# Patient Record
Sex: Female | Born: 1975 | Race: White | Hispanic: No | Smoking: Current every day smoker
Health system: Southern US, Community
[De-identification: ages and names within clinical notes are randomized; demographics above are authoritative.]

## PROBLEM LIST (undated history)

## (undated) DIAGNOSIS — E079 Disorder of thyroid, unspecified: Secondary | ICD-10-CM

## (undated) DIAGNOSIS — J45909 Unspecified asthma, uncomplicated: Secondary | ICD-10-CM

## (undated) DIAGNOSIS — F329 Major depressive disorder, single episode, unspecified: Secondary | ICD-10-CM

## (undated) DIAGNOSIS — M797 Fibromyalgia: Secondary | ICD-10-CM

## (undated) DIAGNOSIS — F419 Anxiety disorder, unspecified: Secondary | ICD-10-CM

## (undated) DIAGNOSIS — R519 Headache, unspecified: Secondary | ICD-10-CM

## (undated) DIAGNOSIS — R51 Headache: Secondary | ICD-10-CM

## (undated) DIAGNOSIS — E039 Hypothyroidism, unspecified: Secondary | ICD-10-CM

## (undated) DIAGNOSIS — K859 Acute pancreatitis without necrosis or infection, unspecified: Secondary | ICD-10-CM

## (undated) DIAGNOSIS — D649 Anemia, unspecified: Secondary | ICD-10-CM

## (undated) DIAGNOSIS — I82890 Acute embolism and thrombosis of other specified veins: Secondary | ICD-10-CM

## (undated) DIAGNOSIS — A6000 Herpesviral infection of urogenital system, unspecified: Secondary | ICD-10-CM

## (undated) DIAGNOSIS — F32A Depression, unspecified: Secondary | ICD-10-CM

## (undated) DIAGNOSIS — G43909 Migraine, unspecified, not intractable, without status migrainosus: Secondary | ICD-10-CM

## (undated) DIAGNOSIS — F319 Bipolar disorder, unspecified: Secondary | ICD-10-CM

## (undated) HISTORY — DX: Migraine, unspecified, not intractable, without status migrainosus: G43.909

## (undated) HISTORY — DX: Disorder of thyroid, unspecified: E07.9

## (undated) HISTORY — PX: FOOT SURGERY: SHX648

## (undated) HISTORY — PX: WISDOM TOOTH EXTRACTION: SHX21

## (undated) HISTORY — DX: Depression, unspecified: F32.A

## (undated) HISTORY — DX: Major depressive disorder, single episode, unspecified: F32.9

## (undated) HISTORY — PX: COLONOSCOPY: SHX174

## (undated) HISTORY — DX: Herpesviral infection of urogenital system, unspecified: A60.00

## (undated) HISTORY — DX: Bipolar disorder, unspecified: F31.9

## (undated) HISTORY — DX: Anxiety disorder, unspecified: F41.9

## (undated) HISTORY — DX: Acute embolism and thrombosis of other specified veins: I82.890

## (undated) HISTORY — DX: Acute pancreatitis without necrosis or infection, unspecified: K85.90

## (undated) HISTORY — DX: Unspecified asthma, uncomplicated: J45.909

---

## 1998-02-09 ENCOUNTER — Other Ambulatory Visit: Admission: RE | Admit: 1998-02-09 | Discharge: 1998-02-09 | Payer: Self-pay | Admitting: Gastroenterology

## 1998-08-25 ENCOUNTER — Other Ambulatory Visit: Admission: RE | Admit: 1998-08-25 | Discharge: 1998-08-25 | Payer: Self-pay | Admitting: Obstetrics

## 1999-09-30 ENCOUNTER — Other Ambulatory Visit: Admission: RE | Admit: 1999-09-30 | Discharge: 1999-09-30 | Payer: Self-pay | Admitting: Obstetrics

## 2001-04-11 ENCOUNTER — Other Ambulatory Visit: Admission: RE | Admit: 2001-04-11 | Discharge: 2001-04-11 | Payer: Self-pay | Admitting: Obstetrics and Gynecology

## 2001-05-03 ENCOUNTER — Ambulatory Visit (HOSPITAL_COMMUNITY): Admission: RE | Admit: 2001-05-03 | Discharge: 2001-05-03 | Payer: Self-pay | Admitting: Obstetrics and Gynecology

## 2001-05-03 ENCOUNTER — Encounter: Payer: Self-pay | Admitting: Obstetrics and Gynecology

## 2001-06-08 ENCOUNTER — Inpatient Hospital Stay (HOSPITAL_COMMUNITY): Admission: AD | Admit: 2001-06-08 | Discharge: 2001-06-08 | Payer: Self-pay | Admitting: Obstetrics and Gynecology

## 2001-07-05 ENCOUNTER — Ambulatory Visit (HOSPITAL_COMMUNITY): Admission: RE | Admit: 2001-07-05 | Discharge: 2001-07-05 | Payer: Self-pay | Admitting: Obstetrics and Gynecology

## 2001-09-21 ENCOUNTER — Inpatient Hospital Stay (HOSPITAL_COMMUNITY): Admission: AD | Admit: 2001-09-21 | Discharge: 2001-09-21 | Payer: Self-pay | Admitting: Obstetrics and Gynecology

## 2001-09-27 ENCOUNTER — Inpatient Hospital Stay (HOSPITAL_COMMUNITY): Admission: AD | Admit: 2001-09-27 | Discharge: 2001-09-29 | Payer: Self-pay | Admitting: Obstetrics and Gynecology

## 2003-01-31 ENCOUNTER — Other Ambulatory Visit: Admission: RE | Admit: 2003-01-31 | Discharge: 2003-01-31 | Payer: Self-pay | Admitting: Obstetrics and Gynecology

## 2003-10-06 ENCOUNTER — Other Ambulatory Visit: Admission: RE | Admit: 2003-10-06 | Discharge: 2003-10-06 | Payer: Self-pay | Admitting: Obstetrics and Gynecology

## 2005-11-02 ENCOUNTER — Other Ambulatory Visit: Admission: RE | Admit: 2005-11-02 | Discharge: 2005-11-02 | Payer: Self-pay | Admitting: Obstetrics and Gynecology

## 2007-01-15 ENCOUNTER — Ambulatory Visit (HOSPITAL_COMMUNITY): Payer: Self-pay | Admitting: Psychiatry

## 2007-01-31 ENCOUNTER — Ambulatory Visit (HOSPITAL_COMMUNITY): Payer: Self-pay | Admitting: Psychiatry

## 2007-03-21 ENCOUNTER — Ambulatory Visit (HOSPITAL_COMMUNITY): Payer: Self-pay | Admitting: Psychiatry

## 2007-09-27 DIAGNOSIS — E039 Hypothyroidism, unspecified: Secondary | ICD-10-CM

## 2007-09-27 HISTORY — DX: Hypothyroidism, unspecified: E03.9

## 2008-01-21 ENCOUNTER — Encounter: Admission: RE | Admit: 2008-01-21 | Discharge: 2008-01-21 | Payer: Self-pay | Admitting: Family Medicine

## 2009-01-03 ENCOUNTER — Emergency Department (HOSPITAL_COMMUNITY): Admission: EM | Admit: 2009-01-03 | Discharge: 2009-01-03 | Payer: Self-pay | Admitting: Emergency Medicine

## 2009-01-19 ENCOUNTER — Ambulatory Visit (HOSPITAL_COMMUNITY): Admission: RE | Admit: 2009-01-19 | Discharge: 2009-01-19 | Payer: Self-pay | Admitting: Gastroenterology

## 2009-03-17 ENCOUNTER — Ambulatory Visit: Payer: Self-pay | Admitting: Family Medicine

## 2009-12-18 ENCOUNTER — Encounter: Admission: RE | Admit: 2009-12-18 | Discharge: 2009-12-18 | Payer: Self-pay | Admitting: Family Medicine

## 2011-01-05 LAB — AMYLASE: Amylase: 91 U/L (ref 27–131)

## 2011-01-05 LAB — URINALYSIS, ROUTINE W REFLEX MICROSCOPIC
Bilirubin Urine: NEGATIVE
Glucose, UA: NEGATIVE mg/dL
Ketones, ur: NEGATIVE mg/dL
Nitrite: NEGATIVE
pH: 5.5 (ref 5.0–8.0)

## 2011-01-05 LAB — COMPREHENSIVE METABOLIC PANEL
CO2: 27 mEq/L (ref 19–32)
Calcium: 8.8 mg/dL (ref 8.4–10.5)
Creatinine, Ser: 0.71 mg/dL (ref 0.4–1.2)
GFR calc non Af Amer: >60 mL/min (ref >60)
Glucose, Bld: 96 mg/dL (ref 70–99)

## 2011-01-05 LAB — CBC
Hemoglobin: 13 g/dL (ref 12.0–15.0)
MCHC: 34.7 g/dL (ref 30.0–36.0)
MCV: 90 fL (ref 78.0–100.0)
RBC: 4.16 MIL/uL (ref 3.87–5.11)

## 2011-01-05 LAB — DIFFERENTIAL
Lymphocytes Relative: 46 % (ref 12–46)
Lymphs Abs: 4 10*3/uL (ref 0.7–4.0)
Neutrophils Relative %: 43 % (ref 43–77)

## 2011-01-05 LAB — LIPASE, BLOOD: Lipase: 84 U/L — ABNORMAL HIGH (ref 11–59)

## 2011-02-11 NOTE — Discharge Summary (Signed)
[facility]  Patient:    Gail Blackburn, Gail Blackburn Visit Number: [id number] MRN: [id number]          Service Type: OBS Location: 910A [id number] Attending Physician:  Oliver Pila Dictated by:   Malachi Pro. Ambrose Mantle, M.D. Admit Date:  09/27/2001 Discharge Date: 09/29/2001                             Discharge Summary  HISTORY OF PRESENT ILLNESS:   A 35 year old white female, para 1-0-0-1, gravida 2, at 39+ weeks gestation, [facility] October 02, 2001, by 10-week ultrasound, inconsistent with last menstrual period.  She presented for induction of labor given favorable cervix, term status, and history of macrosomic infant. Pregnancy complicated by positive smoking, cut down to two to three cigarettes a day, and history of depression.  She went on Paxil in the second trimester and has done well.   Blood group and type AB negative with a negative antibody. RPR nonreactive.  Rubella immune.  Hepatitis B surface antigen negative, HIV negative.  GC negative, chlamydia negative, Group B strep negative.  One-hour glucola 117.  OBSTETRICAL HISTORY:          She had a spontaneous vaginal delivery in 1998, a 9 pound 1 ounce infant.  PAST GYNECOLOGIC HISTORY:     History of HPV treated with Aldara.  PAST SURGICAL HISTORY:        Negative.  PAST MEDICAL HISTORY:         Depression, question about bipolar.  Migraines.  PHYSICAL EXAMINATION:  VITAL SIGNS:                  On admission, the patient was afebrile with normal vital signs.  PELVIC:                       Fetal heart tones were normal.  Estimated fetal weight about 8 pounds.  Cervix 2 to 3 cm, 60%, vertex, -2.  Artificial rupture of membranes produced clear fluid.  HOSPITAL COURSE:              Pitocin was begun.  By 1 p.m. on September 27, 2001, the patient was 4+ cm.  An intrauterine pressure catheter was placed given little change in the 2 hours prior to 1 p.m.  The patient reached complete dilatation and pushed  well with a spontaneous vaginal delivery of a vigorous female infant over intact perineum.  The infant weighed 8 pound 3 ounces.  Apgars were 8 at 1 minute and 9 at 5 minutes.  Dr. Senaida Ores was present at delivery.  Moderate shoulder dystocia was relieved with McRoberts and posterior arm release.  Placenta was delivered spontaneously.  Perineum, cervix, and rectum were intact.  The baby was moving both arms well.  Blood loss about 350 cc.  Postpartum, the patient did well.  She did receive RhoGAM.  On September 28, 2001, she was discharged on the second postpartum day.  LABORATORY DATA:  Hemoglobin on admission 12.1, hematocrit 35.3, white count 11,800, platelet count 237,000.  Followup hemoglobin 11.4, hematocrit 33.3, platelet count 204,000.  RPR was nonreactive.  FINAL DIAGNOSES:              1. Intrauterine pregnancy at 39+ weeks.  2. History of macrosomic delivery.                               3. Depression.                               4. Smoker.                               5. Shoulder dystocia.  OPERATION:                    Spontaneous vaginal delivery  FINAL CONDITION:              Improved.  DISCHARGE INSTRUCTIONS:       Include our regular discharge instructions booklet.  Percocet 5/325, 16 tablets, 1 every 4-6 hours as needed for pain. The patient is advised to return to the office in six weeks for followup examination. Dictated by:   Malachi Pro. Ambrose Mantle, M.D. Attending Physician:  Oliver Pila DD:  09/29/01 TD:  09/29/01 Job: [id number]

## 2014-04-10 ENCOUNTER — Emergency Department (HOSPITAL_COMMUNITY)
Admission: EM | Admit: 2014-04-10 | Discharge: 2014-04-10 | Disposition: A | Discharge status: Home / Self Care | Payer: Medicaid Other | Source: Home / Self Care

## 2014-04-10 ENCOUNTER — Encounter (HOSPITAL_COMMUNITY): Payer: Self-pay | Admitting: Emergency Medicine

## 2014-04-10 DIAGNOSIS — A6000 Herpesviral infection of urogenital system, unspecified: Secondary | ICD-10-CM

## 2014-04-10 DIAGNOSIS — N390 Urinary tract infection, site not specified: Secondary | ICD-10-CM

## 2014-04-10 LAB — POCT PREGNANCY, URINE: Preg Test, Ur: NEGATIVE

## 2014-04-10 LAB — POCT URINALYSIS DIP (DEVICE)
Bilirubin Urine: NEGATIVE
GLUCOSE, UA: 100 mg/dL — AB
HGB URINE DIPSTICK: NEGATIVE
Ketones, ur: NEGATIVE mg/dL
NITRITE: POSITIVE — AB
PH: 6 (ref 5.0–8.0)
Protein, ur: NEGATIVE mg/dL
SPECIFIC GRAVITY, URINE: 1.015 (ref 1.005–1.030)
UROBILINOGEN UA: 1 mg/dL (ref 0.0–1.0)

## 2014-04-10 MED ORDER — PHENAZOPYRIDINE HCL 200 MG PO TABS: 200.0000 mg | ORAL_TABLET | Freq: Three times a day (TID) | ORAL | Status: DC | PRN

## 2014-04-10 MED ORDER — FLUCONAZOLE 150 MG PO TABS: 150.0000 mg | ORAL_TABLET | ORAL | Status: DC

## 2014-04-10 MED ORDER — CIPROFLOXACIN HCL 500 MG PO TABS: 500.0000 mg | ORAL_TABLET | Freq: Two times a day (BID) | ORAL | Status: DC

## 2014-04-10 NOTE — ED Provider Notes (Signed)
CSN: [id number]     Arrival date & time 04/10/14  1804 History   None    No chief complaint on file.  (Consider location/radiation/quality/duration/timing/severity/associated sxs/prior Treatment) HPI Comments: 38 yo WF with hematuria x 2 days, dysuria and urgency x 3-4 days. She denies hematuria today but has been taking AZO which makes it difficult to view. She denies relief with AZO/ increased water/ cranberry juice. She does have + tobacco history but denies previous history of hematuria. SHe also notes yeast infection when given antibiotics. She denies any concern for STDs with only one partner.    No past medical history on file. No past surgical history on file. No family history on file. History  Substance Use Topics  . Smoking status: Not on file  . Smokeless tobacco: Not on file  . Alcohol Use: Not on file   OB History   No data available     Review of Systems  Genitourinary: Positive for dysuria, frequency, hematuria and difficulty urinating.  All other systems reviewed and are negative.   Allergies  Review of patient's allergies indicates not on file.  Home Medications   Prior to Admission medications   Not on File   BP 108/73  Pulse 82  Temp(Src) 98.5 F (36.9 C) (Oral)  Resp 16  SpO2 100% Physical Exam  Nursing note and vitals reviewed. Constitutional: She is oriented to person, place, and time. She appears well-developed and well-nourished.  HENT:  Head: Normocephalic and atraumatic.  Eyes: Conjunctivae are normal.  Neck: Normal range of motion.  Cardiovascular: Normal rate, regular rhythm, normal heart sounds and intact distal pulses.   Pulmonary/Chest: Effort normal and breath sounds normal.  Abdominal: Soft. Bowel sounds are normal. She exhibits no distension and no mass. There is tenderness. There is no rebound and no guarding.  Minimal suprapubic  Genitourinary:  declines  Musculoskeletal: Normal range of motion.  Neurological: She is alert  and oriented to person, place, and time.  Skin: Skin is warm and dry.  Psychiatric: She has a normal mood and affect. Judgment normal.    ED Course  Procedures (including critical care time) Labs Review Labs Reviewed  POCT URINALYSIS DIP (DEVICE) - Abnormal; Notable for the following:    Glucose, UA 100 (*)    Nitrite POSITIVE (*)    Leukocytes, UA TRACE (*)    All other components within normal limits  POCT PREGNANCY, URINE    Imaging Review No results found.   MDM   1. Urinary tract infection, site not specified   Cipro 500 mg AD, Pyridium 200 mg AD, Diflucan 150 mg PRN  Advised needs to establish PCP so recheck of urine can be performed in 1 month with history of hematuria with tobacco use. w/c if SX increase or ER.   Berenice PrimasMelissa R Marquay Kruse, PA-C 04/10/14 1901

## 2014-04-10 NOTE — Discharge Instructions (Signed)
Smoking Cessation, Tips for Success If you are ready to quit smoking, congratulations! You have chosen to help yourself be healthier. Cigarettes bring nicotine, tar, carbon monoxide, and other irritants into your body. Your lungs, heart, and blood vessels will be able to work better without these poisons. There are many different ways to quit smoking. Nicotine gum, nicotine patches, a nicotine inhaler, or nicotine nasal spray can help with physical craving. Hypnosis, support groups, and medicines help break the habit of smoking. WHAT THINGS CAN I DO TO MAKE QUITTING EASIER?  Here are some tips to help you quit for good:  Pick a date when you will quit smoking completely. Tell all of your friends and family about your plan to quit on that date.  Do not try to slowly cut down on the number of cigarettes you are smoking. Pick a quit date and quit smoking completely starting on that day.  Throw away all cigarettes.   Clean and remove all ashtrays from your home, work, and car.   On a card, write down your reasons for quitting. Carry the card with you and read it when you get the urge to smoke.   Cleanse your body of nicotine. Drink enough water and fluids to keep your urine clear or pale yellow. Do this after quitting to flush the nicotine from your body.   Learn to predict your moods. Do not let a bad situation be your excuse to have a cigarette. Some situations in your life might tempt you into wanting a cigarette.   Never have "just one" cigarette. It leads to wanting another and another. Remind yourself of your decision to quit.   Change habits associated with smoking. If you smoked while driving or when feeling stressed, try other activities to replace smoking. Stand up when drinking your coffee. Brush your teeth after eating. Sit in a different chair when you read the paper. Avoid alcohol while trying to quit, and try to drink fewer caffeinated beverages. Alcohol and caffeine may urge  you to smoke.   Avoid foods and drinks that can trigger a desire to smoke, such as sugary or spicy foods and alcohol.   Ask people who smoke not to smoke around you.   Have something planned to do right after eating or having a cup of coffee. For example, plan to take a walk or exercise.   Try a relaxation exercise to calm you down and decrease your stress. Remember, you may be tense and nervous for the first 2 weeks after you quit, but this will pass.   Find new activities to keep your hands busy. Play with a pen, coin, or rubber band. Doodle or draw things on paper.   Brush your teeth right after eating. This will help cut down on the craving for the taste of tobacco after meals. You can also try mouthwash.   Use oral substitutes in place of cigarettes. Try using lemon drops, carrots, cinnamon sticks, or chewing gum. Keep them handy so they are available when you have the urge to smoke.   When you have the urge to smoke, try deep breathing.   Designate your home as a nonsmoking area.   If you are a heavy smoker, ask your health care provider about a prescription for nicotine chewing gum. It can ease your withdrawal from nicotine.   Reward yourself. Set aside the cigarette money you save and buy yourself something nice.   Look for support from others. Join a support group or  smoking cessation program. Ask someone at home or at work to help you with your plan to quit smoking.   Always ask yourself, "Do I need this cigarette or is this just a reflex?" Tell yourself, "Today, I choose not to smoke," or "I do not want to smoke." You are reminding yourself of your decision to quit.  Do not replace cigarette smoking with electronic cigarettes (commonly called e-cigarettes). The safety of e-cigarettes is unknown, and some may contain harmful chemicals.  If you relapse, do not give up! Plan ahead and think about what you will do the next time you get the urge to smoke.  HOW WILL  I FEEL WHEN I QUIT SMOKING? You may have symptoms of withdrawal because your body is used to nicotine (the addictive substance in cigarettes). You may crave cigarettes, be irritable, feel very hungry, cough often, get headaches, or have difficulty concentrating. The withdrawal symptoms are only temporary. They are strongest when you first quit but will go away within 10-14 days. When withdrawal symptoms occur, stay in control. Think about your reasons for quitting. Remind yourself that these are signs that your body is healing and getting used to being without cigarettes. Remember that withdrawal symptoms are easier to treat than the major diseases that smoking can cause.  Even after the withdrawal is over, expect periodic urges to smoke. However, these cravings are generally short lived and will go away whether you smoke or not. Do not smoke!  WHAT RESOURCES ARE AVAILABLE TO HELP ME QUIT SMOKING? Your health care provider can direct you to community resources or hospitals for support, which may include:  Group support.  Education.  Hypnosis.  Therapy. Document Released: 06/10/2004 Document Revised: 07/03/2013 Document Reviewed: 02/28/2013 [facility] Patient Information 2015 [place], [place]. This information is not intended to replace advice given to you by your health care provider. Make sure you discuss any questions you have with your health care provider.   Hematuria, Adult Hematuria is blood in your urine. It can be caused by a bladder infection, kidney infection, prostate infection, kidney stone, or cancer of your urinary tract. Infections can usually be treated with medicine, and a kidney stone usually will pass through your urine. If neither of these is the cause of your hematuria, further workup to find out the reason may be needed. It is very important that you tell your health care provider about any blood you see in your urine, even if the blood stops without treatment or happens without  causing pain. Blood in your urine that happens and then stops and then happens again can be a symptom of a very serious condition. Also, pain is not a symptom in the initial stages of many urinary cancers. HOME CARE INSTRUCTIONS   Drink lots of fluid, 3-4 quarts a day. If you have been diagnosed with an infection, cranberry juice is especially recommended, in addition to large amounts of water.  Avoid caffeine, tea, and carbonated beverages, because they tend to irritate the bladder.  Avoid alcohol because it may irritate the prostate.  Only take over-the-counter or prescription medicines for pain, discomfort, or fever as directed by your health care provider.  If you have been diagnosed with a kidney stone, follow your health care provider's instructions regarding straining your urine to catch the stone.  Empty your bladder often. Avoid holding urine for long periods of time.  After a bowel movement, women should cleanse front to back. Use each tissue only once.  Empty your bladder before  and after sexual intercourse if you are a female. SEEK MEDICAL CARE IF: You develop back pain, fever, a feeling of sickness in your stomach (nausea), or vomiting or if your symptoms are not better in 3 days. Return sooner if you are getting worse. SEEK IMMEDIATE MEDICAL CARE IF:   You have a persistent fever, with a temperature of 101.18F (38.8C) or greater.  You develop severe vomiting and are unable to keep the medicine down.  You develop severe back or abdominal pain despite taking your medicines.  You begin passing a large amount of blood or clots in your urine.  You feel extremely weak or faint, or you pass out. MAKE SURE YOU:   Understand these instructions.  Will watch your condition.  Will get help right away if you are not doing well or get worse. Document Released: 09/12/2005 Document Revised: 07/03/2013 Document Reviewed: 05/13/2013 [facility] Patient Information 2015 [place]. This information is not intended to replace advice given to you by your health care provider. Make sure you discuss any questions you have with your health care provider.   Urinary Tract Infection A urinary tract infection (UTI) can occur any place along the urinary tract. The tract includes the kidneys, ureters, bladder, and urethra. A type of germ called bacteria often causes a UTI. UTIs are often helped with antibiotic medicine.  HOME CARE   If given, take antibiotics as told by your doctor. Finish them even if you start to feel better.  Drink enough fluids to keep your pee (urine) clear or pale yellow.  Avoid tea, drinks with caffeine, and bubbly (carbonated) drinks.  Pee often. Avoid holding your pee in for a long time.  Pee before and after having sex (intercourse).  Wipe from front to back after you poop (bowel movement) if you are a woman. Use each tissue only once. GET HELP RIGHT AWAY IF:   You have back pain.  You have lower belly (abdominal) pain.  You have chills.  You feel sick to your stomach (nauseous).  You throw up (vomit).  Your burning or discomfort with peeing does not go away.  You have a fever.  Your symptoms are not better in 3 days. MAKE SURE YOU:   Understand these instructions.  Will watch your condition.  Will get help right away if you are not doing well or get worse. Document Released: 02/29/2008 Document Revised: 06/06/2012 Document Reviewed: 04/12/2012 [facility] Patient Information 2015 [place], [place]. This information is not intended to replace advice given to you by your health care provider. Make sure you discuss any questions you have with your health care provider.

## 2014-04-10 NOTE — ED Notes (Signed)
C/o burning with urination, pressure and pins and needles when sitting onset 4 days ago.  Tried AZO without relief of symptoms. Drinking water and cranberry  Juice.

## 2014-04-12 ENCOUNTER — Encounter (HOSPITAL_COMMUNITY): Payer: Self-pay | Admitting: Emergency Medicine

## 2014-04-12 ENCOUNTER — Emergency Department (INDEPENDENT_AMBULATORY_CARE_PROVIDER_SITE_OTHER)
Admission: EM | Admit: 2014-04-12 | Discharge: 2014-04-12 | Disposition: A | Discharge status: Home / Self Care | Payer: Medicaid Other | Source: Home / Self Care | Attending: Emergency Medicine | Admitting: Emergency Medicine

## 2014-04-12 DIAGNOSIS — A6000 Herpesviral infection of urogenital system, unspecified: Secondary | ICD-10-CM

## 2014-04-12 MED ORDER — ACYCLOVIR 400 MG PO TABS: 400.0000 mg | ORAL_TABLET | Freq: Three times a day (TID) | ORAL | Status: DC

## 2014-04-12 MED ORDER — IBUPROFEN 800 MG PO TABS
800.0000 mg | ORAL_TABLET | Freq: Once | ORAL | Status: AC
  Administered 2014-04-12: 800 mg via ORAL

## 2014-04-12 MED ORDER — ACYCLOVIR 5 % EX CREA: 1.0000 | TOPICAL_CREAM | CUTANEOUS | Status: DC

## 2014-04-12 MED ORDER — IBUPROFEN 800 MG PO TABS
ORAL_TABLET | ORAL | Status: AC
  Filled 2014-04-12: qty 1

## 2014-04-12 NOTE — Discharge Instructions (Signed)
Genital Herpes °Genital herpes is a sexually transmitted disease. This means that it is a disease passed by having sex with an infected person. There is no cure for genital herpes. The time between attacks can be months to years. The virus may live in a person but produce no problems (symptoms). This infection can be passed to a baby as it travels down the birth canal (vagina). In a newborn, this can cause central nervous system damage, eye damage, or even death. The virus that causes genital herpes is usually HSV-2 virus. The virus that causes oral herpes is usually HSV-1. The diagnosis (learning what is wrong) is made through culture results. °SYMPTOMS  °Usually symptoms of pain and itching begin a few days to a week after contact. It first appears as small blisters that progress to small painful ulcers which then scab over and heal after several days. It affects the outer genitalia, birth canal, cervix, penis, anal area, buttocks, and thighs. °HOME CARE INSTRUCTIONS  °· Keep ulcerated areas dry and clean. °· Take medications as directed. Antiviral medications can speed up healing. They will not prevent recurrences or cure this infection. These medications can also be taken for suppression if there are frequent recurrences. °· While the infection is active, it is contagious. Avoid all sexual contact during active infections. °· Condoms may help prevent spread of the herpes virus. °· Practice safe sex. °· Wash your hands thoroughly after touching the genital area. °· Avoid touching your eyes after touching your genital area. °· Inform your caregiver if you have had genital herpes and become pregnant. It is your responsibility to insure a safe outcome for your baby in this pregnancy. °· Only take over-the-counter or prescription medicines for pain, discomfort, or fever as directed by your caregiver. °SEEK MEDICAL CARE IF:  °· You have a recurrence of this infection. °· You do not respond to medications and are not  improving. °· You have new sources of pain or discharge which have changed from the original infection. °· You have an oral temperature above 102° F (38.9° C). °· You develop abdominal pain. °· You develop eye pain or signs of eye infection. °Document Released: 09/09/2000 Document Revised: 12/05/2011 Document Reviewed: 09/30/2009 °ExitCare® Patient Information ©2015 [employer], LLC. This information is not intended to replace advice given to you by your health care provider. Make sure you discuss any questions you have with your health care provider. ° °

## 2014-04-12 NOTE — ED Notes (Signed)
Pt  Has  A  Flair  Up of  Herpes      She is  Requesting  A  Refill  Of  Her   Zovirax

## 2014-04-12 NOTE — ED Provider Notes (Signed)
CSN: [id number]     Arrival date & time 04/12/14  1422 History   First MD Initiated Contact with Patient 04/12/14 1445     Chief Complaint  Patient presents with  . Medication Refill   (Consider location/radiation/quality/duration/timing/severity/associated sxs/prior Treatment) HPI Comments: Patient reports reoccurrence of genital herpes that began over night. Requesting refill of acyclovir.   The history is provided by the patient.    History reviewed. No pertinent past medical history. History reviewed. No pertinent past surgical history. History reviewed. No pertinent family history. History  Substance Use Topics  . Smoking status: Current Every Day Smoker -- 0.50 packs/day    Types: Cigarettes  . Smokeless tobacco: Not on file  . Alcohol Use: No   OB History   Grav Para Term Preterm Abortions TAB SAB Ect Mult Living                 Review of Systems  All other systems reviewed and are negative.   Allergies  Review of patient's allergies indicates no known allergies.  Home Medications   Prior to Admission medications   Medication Sig Start Date End Date Taking? Authorizing Provider  acyclovir (ZOVIRAX) 400 MG tablet Take 1 tablet (400 mg total) by mouth 3 (three) times daily. X 7 days 04/12/14   Ardis RowanJennifer Lee Kechia Yahnke, PA  acyclovir cream (ZOVIRAX) 5 % Apply 1 application topically every 3 (three) hours. X 7 days 04/12/14   Ardis RowanJennifer Lee Wylie Russon, PA  ciprofloxacin (CIPRO) 500 MG tablet Take 1 tablet (500 mg total) by mouth every 12 (twelve) hours. 04/10/14   Melissa R Smith, PA-C  fluconazole (DIFLUCAN) 150 MG tablet Take 1 tablet (150 mg total) by mouth once a week. 04/10/14   Melissa R Smith, PA-C  phenazopyridine (PYRIDIUM) 200 MG tablet Take 1 tablet (200 mg total) by mouth 3 (three) times daily as needed for pain. 04/10/14   Melissa R Smith, PA-C   BP 113/74  Pulse 102  Temp(Src) 98.6 F (37 C) (Oral)  Resp 14  SpO2 98%  LMP 04/03/2014 Physical Exam  Nursing note  and vitals reviewed. Constitutional: She is oriented to person, place, and time. She appears well-developed and well-nourished.  HENT:  Head: Normocephalic and atraumatic.  Eyes: Conjunctivae are normal. No scleral icterus.  Cardiovascular: Normal rate.   Pulmonary/Chest: Effort normal.  Abdominal: Soft. Bowel sounds are normal. She exhibits no distension. There is no tenderness.  Genitourinary: Pelvic exam was performed with patient supine. There is rash and tenderness on the right labia. There is rash and tenderness on the left labia.  Several scattered small shallow painful ulcers on external genitalia No speculum exam performed secondary to vaginal pain from herpetic lesions No visible vaginal discharge or bleeding  Musculoskeletal: Normal range of motion.  Neurological: She is alert and oriented to person, place, and time.  Skin: Skin is warm and dry.  Psychiatric: She has a normal mood and affect. Her behavior is normal.    ED Course  Procedures (including critical care time) Labs Review Labs Reviewed - No data to display  Imaging Review No results found.   MDM   1. Genital herpes    Acyclovir as prescribed. Ibuprofen or tylenol as directed on packaging for pain. Gyn follow up if no improvement.    Jess BartersJennifer Lee MoultonPresson, GeorgiaPA 04/12/14 1504

## 2014-04-13 NOTE — ED Provider Notes (Signed)
Medical screening examination/treatment/procedure(s) were performed by non-physician practitioner and as supervising physician I was immediately available for consultation/collaboration.  Weylyn Ricciuti, M.D.  Shane Melby C Rashema Seawright, MD 04/13/14 0856 

## 2014-04-15 NOTE — ED Provider Notes (Signed)
Medical screening examination/treatment/procedure(s) were performed by resident physician or non-physician practitioner and as supervising physician I was immediately available for consultation/collaboration.   KINDL,JAMES DOUGLAS MD.   James D Kindl, MD 04/15/14 1123 

## 2014-10-15 ENCOUNTER — Encounter: Payer: Self-pay | Admitting: Family Medicine

## 2014-10-15 ENCOUNTER — Ambulatory Visit (INDEPENDENT_AMBULATORY_CARE_PROVIDER_SITE_OTHER): Payer: Medicaid Other | Admitting: Family Medicine

## 2014-10-15 VITALS — BP 124/71 | HR 84 | Temp 98.2°F | Ht 65.5 in | Wt 133.0 lb

## 2014-10-15 DIAGNOSIS — F17200 Nicotine dependence, unspecified, uncomplicated: Secondary | ICD-10-CM

## 2014-10-15 DIAGNOSIS — G43909 Migraine, unspecified, not intractable, without status migrainosus: Secondary | ICD-10-CM | POA: Insufficient documentation

## 2014-10-15 DIAGNOSIS — R946 Abnormal results of thyroid function studies: Secondary | ICD-10-CM

## 2014-10-15 DIAGNOSIS — F419 Anxiety disorder, unspecified: Secondary | ICD-10-CM

## 2014-10-15 DIAGNOSIS — Z7689 Persons encountering health services in other specified circumstances: Secondary | ICD-10-CM

## 2014-10-15 DIAGNOSIS — R7989 Other specified abnormal findings of blood chemistry: Secondary | ICD-10-CM | POA: Insufficient documentation

## 2014-10-15 DIAGNOSIS — Z7189 Other specified counseling: Secondary | ICD-10-CM

## 2014-10-15 DIAGNOSIS — F32A Depression, unspecified: Secondary | ICD-10-CM

## 2014-10-15 DIAGNOSIS — G8929 Other chronic pain: Secondary | ICD-10-CM | POA: Insufficient documentation

## 2014-10-15 DIAGNOSIS — Z720 Tobacco use: Secondary | ICD-10-CM

## 2014-10-15 DIAGNOSIS — F329 Major depressive disorder, single episode, unspecified: Secondary | ICD-10-CM

## 2014-10-15 NOTE — Assessment & Plan Note (Signed)
Does not elaborate much but does voice a good amount of anxiety to get to appointment today. No SI/HI -Continue to see therapist -Continue Xanax as prescribed -Informed patient of Dr Carola RhineKane's availability to our patients.  Patient voiced good understanding.

## 2014-10-15 NOTE — Assessment & Plan Note (Signed)
Patient with TSH in 08/2014:  0.370 obtained by Ob/Gyn.  Patient is essentially asymptomatic with only mild hair loss.   -Repeat TSH in 3 months. -Will consider treatment at that time. -Schedule PE in 3 months

## 2014-10-15 NOTE — Assessment & Plan Note (Signed)
Patient does not elaborate.  Says that she has a lot going on.   -Continue to see therapist -Dr Carola RhineKane's availability made known to patient -Continue Seroquel -Open door policy made known to patient if she would like additional assistance with this

## 2014-10-15 NOTE — Patient Instructions (Signed)
It was a pleasure seeing you today, Ms Gail Blackburn!  Information regarding what we discussed is included in this packet.  Please make an appointment to see me in 3 months for your physical and fasting labs.  You can make an appointment with Dr Raymondo BandKoval for smoking cessation options.  Please feel free to call our office at [phone] if any questions or concerns arise.  Warm Regards, Beniah Magnan M. Elanore Talcott, DO You Can Quit Smoking If you are ready to quit smoking or are thinking about it, congratulations! You have chosen to help yourself be healthier and live longer! There are lots of different ways to quit smoking. Nicotine gum, nicotine patches, a nicotine inhaler, or nicotine nasal spray can help with physical craving. Hypnosis, support groups, and medicines help break the habit of smoking. TIPS TO GET OFF AND STAY OFF CIGARETTES  Learn to predict your moods. Do not let a bad situation be your excuse to have a cigarette. Some situations in your life might tempt you to have a cigarette.  Ask friends and co-workers not to smoke around you.  Make your home smoke-free.  Never have "just one" cigarette. It leads to wanting another and another. Remind yourself of your decision to quit.  On a card, make a list of your reasons for not smoking. Read it at least the same number of times a day as you have a cigarette. Tell yourself everyday, "I do not want to smoke. I choose not to smoke."  Ask someone at home or work to help you with your plan to quit smoking.  Have something planned after you eat or have a cup of coffee. Take a walk or get other exercise to perk you up. This will help to keep you from overeating.  Try a relaxation exercise to calm you down and decrease your stress. Remember, you may be tense and nervous the first two weeks after you quit. This will pass.  Find new activities to keep your hands busy. Play with a pen, coin, or rubber band. Doodle or draw things on paper.  Brush your teeth  right after eating. This will help cut down the craving for the taste of tobacco after meals. You can try mouthwash too.  Try gum, breath mints, or diet candy to keep something in your mouth. IF YOU SMOKE AND WANT TO QUIT:  Do not stock up on cigarettes. Never buy a carton. Wait until one pack is finished before you buy another.  Never carry cigarettes with you at work or at home.  Keep cigarettes as far away from you as possible. Leave them with someone else.  Never carry matches or a lighter with you.  Ask yourself, "Do I need this cigarette or is this just a reflex?"  Bet with someone that you can quit. Put cigarette money in a piggy bank every morning. If you smoke, you give up the money. If you do not smoke, by the end of the week, you keep the money.  Keep trying. It takes 21 days to change a habit!  Talk to your doctor about using medicines to help you quit. These include nicotine replacement gum, lozenges, or skin patches. Document Released: 07/09/2009 Document Revised: 12/05/2011 Document Reviewed: 07/09/2009 [facility] Patient Information 2015 [place]. This information is not intended to replace advice given to you by your health care provider. Make sure you discuss any questions you have with your health care provider.

## 2014-10-15 NOTE — Assessment & Plan Note (Signed)
Contemplative.  Smokes 1/2PPD since 39 years old. -Smoking cessation counseling -Schedule with Dr Raymondo BandKoval for continue counseling and possible medication/patches/gum

## 2014-10-15 NOTE — Progress Notes (Signed)
Patient ID: Gail Blackburn, female   DOB: 1976-03-13, 39 y.o.   MRN: [id number]    Subjective: WU:JWJXBJYNWCC:establish care HPI: Patient is a 39 y.o. female presenting to clinic today for establish care. Concerns today include:  1. Thyroid Patient reports that thyroid was tested by Ob/Gyn (Dr Huel CoteKathy Richardson) in 08/2014.  TSH 0.370.  She reports having had a h/o abnormal TSH values that occasionally necessitated medication treatment by previous PCP.  However, she is unsure as to whether the value has been high or low in past and does not recall what medication she was on before.  She denies heart palpitations, sweating, weight loss, skin changes, diarrhea, vision changes, excessive energy.  Endorses some hair loss and absent period since October.   2. Amenorrhea Patient being seen by Ob/Gyn for absence of periods since October.  Patient was NOT on hormonal contraceptives at that time.  She began depo shots in December, which she is tolerating well.  No abdominal pain, no breast tenderness. Denies possibility of pregnancy.  3. Tobacco use d/o Patient 1/2 ppd smoker since 39 yo.  Is supplementing with e-cigs in order to attempt to cut down.  Is interested in quitting but reports a lot of stress and anxiety in her life now.  She is seeing an outside provider to help with this.  4. Anxiety/Depression Patient does not explore this topic with me but does report that she is seeing a mental health provider and is being medicated with Seroquel and Xanax.  She endorses extreme anxiety in preparation to come to appointment today.  No SI/HI.  Social History Reviewed: current smoker. FamHx and MedHx updated.  Please see EMR.  ROS: All other systems reviewed and are negative.  Objective: Office vital signs reviewed. BP 124/71 mmHg  Pulse 84  Temp(Src) 98.2 F (36.8 C) (Oral)  Ht 5' 5.5" (1.664 m)  Wt 133 lb (60.328 kg)  BMI 21.79 kg/m2  LMP 07/15/2014  Physical Examination:  General: Awake, alert, well  nourished, well appearing female, NAD HEENT: Normal, EOMI, no proptosis    Neck: No masses palpated. No LAD. Thyroid smooth and not enlarged, no nodules Extremities: WWP, No edema, cyanosis or clubbing; +2 pulses bilaterally MSK: Normal gait and station Skin: dry, intact, no rashes or lesions Psych: normal speech, good eye contact, mood stable, affect appropriate  Assessment: 39 y.o. female here to establish care with low TSH, h.o amenorrhea, tobacco use d/o  Plan: See Problem List and After Visit Summary   Gail IpAshly M Gottschalk, DO PGY-1, [facility]

## 2015-10-09 ENCOUNTER — Ambulatory Visit (INDEPENDENT_AMBULATORY_CARE_PROVIDER_SITE_OTHER): Payer: Medicaid Other | Admitting: Family Medicine

## 2015-10-09 ENCOUNTER — Encounter: Payer: Self-pay | Admitting: Family Medicine

## 2015-10-09 ENCOUNTER — Ambulatory Visit (HOSPITAL_COMMUNITY)
Admission: RE | Admit: 2015-10-09 | Discharge: 2015-10-09 | Disposition: A | Discharge status: Home / Self Care | Payer: Medicaid Other | Source: Ambulatory Visit | Attending: Family Medicine | Admitting: Family Medicine

## 2015-10-09 VITALS — BP 88/74 | HR 98 | Temp 99.1°F | Wt 124.1 lb

## 2015-10-09 DIAGNOSIS — Z7189 Other specified counseling: Secondary | ICD-10-CM | POA: Diagnosis not present

## 2015-10-09 DIAGNOSIS — R0789 Other chest pain: Secondary | ICD-10-CM | POA: Insufficient documentation

## 2015-10-09 NOTE — Patient Instructions (Signed)
Thank you so much for coming to visit me today! We will check your thyroid today. We will contact you with the results. Please follow up with your primary doctor. I would also recommend going back to your Psychiatrist and restarting some of your medications.  Thanks again! Dr. Caroleen Hammanumley

## 2015-10-10 LAB — TSH: TSH: 0.893 u[IU]/mL (ref 0.350–4.500)

## 2015-10-11 NOTE — Progress Notes (Signed)
Subjective:     Patient ID: Gail Blackburn, female   DOB: 10-20-1975, 40 y.o.   MRN: [id number]  HPI Gail Blackburn is a 40yo female presenting today for chest pain. - Pain located in central chest with radiation to back - Reports occasional palpitations. - Rarely with pain down left arm - Reports occasionally gets "flustered" - Occasionally associated with Shortness of Breath - Denies nausea/vomiting - Pain not currently present - Has been occuring daily for the last 2-3 months. Occurs daily and lasts 10-6030min - Not associated with food - Seems to be triggered by anxiety and stress - Occurs at any time, while lying down, sitting, walking, etc. Not specifically worsened with exertion and not necessarily improved with rest. - Has not seen therapist or psychiatrist lately. States she stopped her psychiatric medications several months ago. - Notes history of thyroid problems. Admits she was supposed to follow up with her PCP, but she never did. - Denies previous history of cardiac problems. Family history of quadruple bypasses in Grandmother (while in 3180s) and Grandfather (while in 90s). History of catheterization in Grandmother, but unsure of outcome. - Continues to smoke 1ppd  Review of Systems Per HPI. Other systems negative.    Objective:   Physical Exam  Constitutional: She appears well-developed and well-nourished. No distress.  Neck: No thyromegaly present.  Cardiovascular: Normal rate, regular rhythm and intact distal pulses.  Exam reveals no gallop and no friction rub.   No murmur heard. Pulmonary/Chest: Effort normal. No respiratory distress. She has no wheezes. She exhibits no tenderness.  Musculoskeletal: She exhibits no edema.  Psychiatric: She has a normal mood and affect. Her behavior is normal.   EKG with incomplete RBBB. No prior EKG for comparison.    Assessment and Plan:     1. Other chest pain - TSH normal - EKG 12-Lead with incomplete RBBB, no prior for comparison -  Suspect due to anxiety, given discontinuation of psychiatric medication, no follow up with Psych or Therapy, and association of pain with stress or anxiety - Recommend close follow up with Psychiatry to restart medications - Red flags given. To go to ED if these occur. - Follow up with PCP

## 2015-10-20 ENCOUNTER — Encounter: Payer: Self-pay | Admitting: Family Medicine

## 2015-11-02 ENCOUNTER — Encounter: Payer: Medicaid Other | Admitting: Family Medicine

## 2015-12-24 ENCOUNTER — Emergency Department (HOSPITAL_COMMUNITY)
Admission: EM | Admit: 2015-12-24 | Discharge: 2015-12-24 | Disposition: A | Discharge status: Home / Self Care | Payer: Medicaid Other | Source: Home / Self Care | Attending: Emergency Medicine | Admitting: Emergency Medicine

## 2015-12-24 ENCOUNTER — Encounter (HOSPITAL_COMMUNITY): Payer: Self-pay | Admitting: *Deleted

## 2015-12-24 DIAGNOSIS — R0982 Postnasal drip: Secondary | ICD-10-CM

## 2015-12-24 DIAGNOSIS — J019 Acute sinusitis, unspecified: Secondary | ICD-10-CM

## 2015-12-24 MED ORDER — PREDNISONE 20 MG PO TABS: ORAL_TABLET | ORAL | Status: DC

## 2015-12-24 NOTE — ED Notes (Signed)
Patient reports 5 day history of nasal congestion, facial pain, and productive cough. Unsure of fever, no fever today.

## 2015-12-24 NOTE — ED Provider Notes (Signed)
CSN: [id number]     Arrival date & time 12/24/15  1301 History   First MD Initiated Contact with Patient 12/24/15 1329     Chief Complaint  Patient presents with  . Facial Pain  . Nasal Congestion   (Consider location/radiation/quality/duration/timing/severity/associated sxs/prior Treatment) HPI Comments: 40 year old healthy-appearing female complaining of pain behind the right eye and the paranasal area as well as the mouth and sensation of teethaching and questionable ear discomfort for 6 days. Denies fever. The only medicine she is taking his for Tylenol. She continues to smoke a half pack a day.   Past Medical History  Diagnosis Date  . Anxiety   . Asthma   . Depression   . Thyroid disease     treated in past not sure if too high or too low   History reviewed. No pertinent past surgical history. Family History  Problem Relation Age of Onset  . Bipolar disorder Mother   . Alcohol abuse Mother   . Hyperlipidemia Mother   . Heart disease Maternal Grandmother   . Stroke Maternal Grandmother   . Leukemia Maternal Grandmother   . Hyperlipidemia Maternal Grandmother   . Hypertension Maternal Grandmother   . Graves' disease Cousin   . Alcohol abuse Maternal Aunt   . Drug abuse Maternal Aunt   . Asthma Maternal Aunt   . COPD Maternal Aunt   . Bipolar disorder Maternal Aunt   . Hyperlipidemia Maternal Aunt    Social History  Substance Use Topics  . Smoking status: Current Every Day Smoker -- 0.50 packs/day    Types: Cigarettes  . Smokeless tobacco: None  . Alcohol Use: No   OB History    No data available     Review of Systems  Constitutional: Negative for fever, chills, activity change, appetite change and fatigue.  HENT: Positive for congestion, postnasal drip, rhinorrhea and sinus pressure. Negative for ear discharge, facial swelling, sore throat and trouble swallowing.   Eyes: Negative.   Respiratory: Negative.  Negative for cough and shortness of breath.    Cardiovascular: Negative.   Musculoskeletal: Negative for neck pain and neck stiffness.  Skin: Negative for pallor and rash.  Neurological: Negative.     Allergies  Review of patient's allergies indicates no known allergies.  Home Medications   Prior to Admission medications   Medication Sig Start Date End Date Taking? Authorizing Provider  gabapentin (NEURONTIN) 600 MG tablet Take 600 mg by mouth 3 (three) times daily.   Yes Historical Provider, MD  predniSONE (DELTASONE) 20 MG tablet Take 3 tabs po on first day, 2 tabs second day, 2 tabs third day, 1 tab fourth day, 1 tab 5th day. Take with food. 12/24/15   Gail Rasmussen, NP   Meds Ordered and Administered this Visit  Medications - No data to display  BP 104/63 mmHg  Pulse 81  Temp(Src) 97.8 F (36.6 C) (Oral)  Resp 16  SpO2 98%  LMP 11/29/2015 No data found.   Physical Exam  Constitutional: She is oriented to person, place, and time. She appears well-developed and well-nourished.  HENT:  Mouth/Throat: No oropharyngeal exudate.  Bilateral TMs are normal. No retraction or erythema. Posterior pharynx with streaky erythema, cobblestoning and clear PND. Light palpation of the bridge of the nose as well as the paranasal bones, supraorbital ridges produces tenderness. These areas are not over sinuses. She is able to breathe freely through the nose. No current nasal congestion. No facial discoloration or swelling.  Eyes: Conjunctivae and EOM  are normal.  Neck: Normal range of motion. Neck supple.  Cardiovascular: Normal rate, regular rhythm and normal heart sounds.   Pulmonary/Chest: Effort normal and breath sounds normal. No respiratory distress. She has no wheezes. She has no rales.  Musculoskeletal: Normal range of motion. She exhibits no edema.  Lymphadenopathy:    She has no cervical adenopathy.  Neurological: She is alert and oriented to person, place, and time. She exhibits normal muscle tone.  Skin: Skin is warm and dry.   Psychiatric: She has a normal mood and affect.  Nursing note and vitals reviewed.   ED Course  Procedures (including critical care time)  Labs Review Labs Reviewed - No data to display  Imaging Review No results found.   Visual Acuity Review  Right Eye Distance:   Left Eye Distance:   Bilateral Distance:    Right Eye Near:   Left Eye Near:    Bilateral Near:         MDM   1. Acute rhinosinusitis   2. PND (post-nasal drip)    Sinusitis, Adult  Symptoms are likely due to viral or even allergic etiology. Recommend taking Sudafed PE 10 mg every 4 hours for congestion. He may obtain this and I cheaper generic form. Medicines to help with drainage include Zyrtec, Claritin or Allegra. If you need something stronger he may take Chlor-Trimeton 2-4 mg every 4 hours. This may call some drowsiness. Use copious amounts of saline nasal spray frequently. Ibuprofen 400 mg every 6 hours as needed for discomfort Drink plenty fluids and stay well-hydrated. Smoking will cause sinusitis to did get worse and reduce your body's ability to fight all the symptoms. Meds ordered this encounter  Medications  . gabapentin (NEURONTIN) 600 MG tablet    Sig: Take 600 mg by mouth 3 (three) times daily.  . predniSONE (DELTASONE) 20 MG tablet    Sig: Take 3 tabs po on first day, 2 tabs second day, 2 tabs third day, 1 tab fourth day, 1 tab 5th day. Take with food.    Dispense:  9 tablet    Refill:  0    Order Specific Question:  Supervising Provider    Answer:  Micheline ChapmanHONIG, ERIN J [[id number]]       Gail Rasmussenavid Treacy Holcomb, NP 12/24/15 1418

## 2015-12-24 NOTE — Discharge Instructions (Signed)
Sinusitis, Adult  Symptoms are likely due to viral or even allergic etiology. Recommend taking Sudafed PE 10 mg every 4 hours for congestion. He may obtain this and I cheaper generic form. Medicines to help with drainage include Zyrtec, Claritin or Allegra. If you need something stronger he may take Chlor-Trimeton 2-4 mg every 4 hours. This may call some drowsiness. Use copious amounts of saline nasal spray frequently. Ibuprofen 400 mg every 6 hours as needed for discomfort Drink plenty fluids and stay well-hydrated. Smoking will cause sinusitis to did get worse and reduce your body's ability to fight all the symptoms.  Sinusitis is redness, soreness, and puffiness (inflammation) of the air pockets in the bones of your face (sinuses). The redness, soreness, and puffiness can cause air and mucus to get trapped in your sinuses. This can allow germs to grow and cause an infection.  HOME CARE   Drink enough fluids to keep your pee (urine) clear or pale yellow.  Use a humidifier in your home.  Run a hot shower to create steam in the bathroom. Sit in the bathroom with the door closed. Breathe in the steam 3-4 times a day.  Put a warm, moist washcloth on your face 3-4 times a day, or as told by your doctor.  Use salt water sprays (saline sprays) to wet the thick fluid in your nose. This can help the sinuses drain.  Only take medicine as told by your doctor. GET HELP RIGHT AWAY IF:   Your pain gets worse.  You have very bad headaches.  You are sick to your stomach (nauseous).  You throw up (vomit).  You are very sleepy (drowsy) all the time.  Your face is puffy (swollen).  Your vision changes.  You have a stiff neck.  You have trouble breathing. MAKE SURE YOU:   Understand these instructions.  Will watch your condition.  Will get help right away if you are not doing well or get worse.   This information is not intended to replace advice given to you by your health care  provider. Make sure you discuss any questions you have with your health care provider.   Document Released: 02/29/2008 Document Revised: 10/03/2014 Document Reviewed: 04/17/2012 Elsevier Interactive Patient Education [employer].

## 2016-03-26 HISTORY — PX: OTHER SURGICAL HISTORY: SHX169

## 2016-04-12 ENCOUNTER — Encounter: Payer: Self-pay | Admitting: Student

## 2016-04-12 ENCOUNTER — Telehealth: Payer: Self-pay

## 2016-04-12 ENCOUNTER — Ambulatory Visit (INDEPENDENT_AMBULATORY_CARE_PROVIDER_SITE_OTHER): Payer: Medicaid Other | Admitting: Student

## 2016-04-12 VITALS — BP 115/69 | HR 94 | Temp 98.4°F | Wt 130.0 lb

## 2016-04-12 DIAGNOSIS — Z7189 Other specified counseling: Secondary | ICD-10-CM | POA: Diagnosis present

## 2016-04-12 DIAGNOSIS — Z3490 Encounter for supervision of normal pregnancy, unspecified, unspecified trimester: Secondary | ICD-10-CM

## 2016-04-12 DIAGNOSIS — N912 Amenorrhea, unspecified: Secondary | ICD-10-CM

## 2016-04-12 DIAGNOSIS — Z331 Pregnant state, incidental: Secondary | ICD-10-CM

## 2016-04-12 LAB — POCT URINE PREGNANCY: PREG TEST UR: POSITIVE — AB

## 2016-04-12 NOTE — Patient Instructions (Signed)
Follow up with your OB/Gyn Please STOP the Vistaril as it can cause structural abnormalities in the baby Please obtain an US, this will be scheduled for you If you have bleeding more than a period or strong abdominal pain, go to the ED for evaluation

## 2016-04-12 NOTE — Progress Notes (Signed)
   Subjective:    Patient ID: Gail Blackburn, female    DOB: 1976-05-22, 40 y.o.   MRN: [id number]   CC: Positive pregnancy test at home  HPI: 40 year old female presenting after having 2 positive pregnancy tests at home  Positive pregnancy test - Last menstrual period 7-8 weeks ago. She is unsure of the date - She has not been using condoms. But she uses progesterone only contraceptive pill - Reports she missed 1 day then took 2 pills the next day. She has been otherwise taking them at approximately the same time everyday - She takes progesterone only pills because she is an active smoker and she is 40 years old - She does have an OB who she hopes to follow with   anxiety/depression - Takes Vistaril which she feels helps  her anxiety. No longer takes Xanax or Seroquel - Reports her mood has been good on this regimen   chronic pain  - Takes Suboxone which is managed by her psychiatrist and Neurontin     Smoking status reviewed  smoked half pack per day   Review of Systems  Per HPI, else denies recent illness, fever, headache, changes in vision, chest pain, shortness of breath, abdominal pain, N/V/D, weakness    Objective:  BP 115/69 mmHg  Pulse 94  Temp(Src) 98.4 F (36.9 C) (Oral)  Wt 130 lb (58.968 kg)  LMP  (LMP Unknown) Vitals and nursing note reviewed  General: NAD Cardiac: RRR Respiratory: CTAB, normal effort Abdomen: soft, nontender, nondistended. Bowel sounds present Extremities: no edema or cyanosis. WWP. Skin: warm and dry, no rashes noted Neuro: alert and oriented, no focal deficits  UPT: positive   Assessment & Plan:    Pregnancy New diagnosis of pregnancy. High risk in nature given the fact that she is a current Suboxone user, current smoker, has anxiety and depression. She does have an obstetrician who she wishes to follow with - Will obtain beta hCG today - Ultrasound today to confirm intrauterine pregnancy - counseled to stop vistaril given  potential side effects for baby     Gail Blackburn A. Kennon RoundsHaney MD, MS Family Medicine Resident PGY-2 Pager [phone]

## 2016-04-12 NOTE — Telephone Encounter (Signed)
Called pt to [facility]. Left message for patient to return call. Shawna OrleansMeredith B Thomsen, CMA

## 2016-04-12 NOTE — Assessment & Plan Note (Addendum)
New diagnosis of pregnancy. High risk in nature given the fact that she is a current Suboxone user, current smoker, has anxiety and depression. She does have an obstetrician who she wishes to follow with - Will obtain beta hCG today - Ultrasound today to confirm intrauterine pregnancy - counseled to stop vistaril given potential side effects for baby

## 2016-04-12 NOTE — Telephone Encounter (Signed)
Pt informed. Armon Orvis, CMA  

## 2016-04-13 ENCOUNTER — Other Ambulatory Visit: Payer: Self-pay | Admitting: Student

## 2016-04-13 ENCOUNTER — Ambulatory Visit (HOSPITAL_COMMUNITY)
Admission: RE | Admit: 2016-04-13 | Discharge: 2016-04-13 | Disposition: A | Discharge status: Home / Self Care | Payer: Medicaid Other | Source: Ambulatory Visit | Attending: Family Medicine | Admitting: Family Medicine

## 2016-04-13 DIAGNOSIS — Z3A01 Less than 8 weeks gestation of pregnancy: Secondary | ICD-10-CM | POA: Diagnosis not present

## 2016-04-13 DIAGNOSIS — Z331 Pregnant state, incidental: Secondary | ICD-10-CM | POA: Diagnosis not present

## 2016-04-13 DIAGNOSIS — Z3490 Encounter for supervision of normal pregnancy, unspecified, unspecified trimester: Secondary | ICD-10-CM

## 2016-04-13 LAB — HCG, QUANTITATIVE, PREGNANCY: hCG, Beta Chain, Quant, S: 66165.2 m[IU]/mL — ABNORMAL HIGH

## 2016-05-03 ENCOUNTER — Encounter (HOSPITAL_COMMUNITY): Payer: Self-pay | Admitting: *Deleted

## 2016-05-05 NOTE — H&P (Signed)
Gail Blackburn is an 40 y.o. female  [id number] who presents for suction dilation and evacuation for an unfortunate finding of missed abortion measuring about 8 weeks.  She has had no bleeding or pain, was found on routine US.   She has a history of anxiety and depression and is on low dose suboxone maintenance dosing.  She is a smoker but cut down with pregnancy.   Pertinent Gynecological History:  OB History: NSVD x 2   Menstrual History:  No LMP recorded (lmp unknown).    Past Medical History:  Diagnosis Date  . Anemia    PRIOR HISTORY  . Anxiety   . Asthma   . Depression   . Fibromyalgia   . Headache    MIGRAINES  . Hypothyroidism 2009   TOOK MEDS FOR FEW WEEKS NO MEDS NOW  . Thyroid disease    treated in past not sure if too high or too low    Past Surgical History:  Procedure Laterality Date  . COLONOSCOPY    . TEETH PULLED  03/2016  . WISDOM TOOTH EXTRACTION      Family History  Problem Relation Age of Onset  . Bipolar disorder Mother   . Alcohol abuse Mother   . Hyperlipidemia Mother   . Heart disease Maternal Grandmother   . Stroke Maternal Grandmother   . Leukemia Maternal Grandmother   . Hyperlipidemia Maternal Grandmother   . Hypertension Maternal Grandmother   . Graves' disease Cousin   . Alcohol abuse Maternal Aunt   . Drug abuse Maternal Aunt   . Asthma Maternal Aunt   . COPD Maternal Aunt   . Bipolar disorder Maternal Aunt   . Hyperlipidemia Maternal Aunt     Social History:  reports that she has been smoking Cigarettes.  She has a 45.00 pack-year smoking history. She does not have any smokeless tobacco history on file. She reports that she does not drink alcohol or use drugs.  Allergies: No Known Allergies  No prescriptions prior to admission.    Review of Systems  Constitutional: Negative for fever.  Gastrointestinal: Negative for abdominal pain.    Height 5' 5.5" (1.664 m), weight 61.2 kg (135 lb). Physical Exam  Constitutional: She is  oriented to person, place, and time. She appears well-developed.  Cardiovascular: Normal rate.   Respiratory: Effort normal.  GI: Soft.  Genitourinary: Vagina normal.  Neurological: She is alert and oriented to person, place, and time.  Psychiatric: She has a normal mood and affect.    No results found for this or any previous visit (from the past 24 hour(s)).  No results found.  Assessment/Plan: The findings of missed abortion were discussed with the patient in detail. We discussed her options going forward with expectant management, cytotec induced bleeding, and D&C. The risks and benefits of each approach were reviewed and the patient elects to schedule a D&C. The risks of the procedure were reviewed with her in detail including bleeding, infection, and possible uterine perforation. She will be prescribed 400mcg of cytotec to moisten and insert vaginally 3 hours prior to the procedure to aid with cervical dilation. Her blood type is Rh negative and will get rhogam at the hospital.  Oliver PilaICHARDSON,Kathie Posa W 05/05/2016, 11:15 AM

## 2016-05-06 ENCOUNTER — Ambulatory Visit (HOSPITAL_COMMUNITY): Payer: Medicaid Other | Admitting: Anesthesiology

## 2016-05-06 ENCOUNTER — Ambulatory Visit (HOSPITAL_COMMUNITY)
Admission: RE | Admit: 2016-05-06 | Discharge: 2016-05-06 | Disposition: A | Discharge status: Home / Self Care | Payer: Medicaid Other | Source: Ambulatory Visit | Attending: Obstetrics and Gynecology | Admitting: Obstetrics and Gynecology

## 2016-05-06 ENCOUNTER — Encounter (HOSPITAL_COMMUNITY): Payer: Self-pay

## 2016-05-06 ENCOUNTER — Encounter (HOSPITAL_COMMUNITY)
Admission: RE | Disposition: A | Discharge status: Home / Self Care | Payer: Self-pay | Source: Ambulatory Visit | Attending: Obstetrics and Gynecology

## 2016-05-06 DIAGNOSIS — F17210 Nicotine dependence, cigarettes, uncomplicated: Secondary | ICD-10-CM | POA: Diagnosis not present

## 2016-05-06 DIAGNOSIS — O021 Missed abortion: Secondary | ICD-10-CM | POA: Diagnosis present

## 2016-05-06 DIAGNOSIS — Z3A08 8 weeks gestation of pregnancy: Secondary | ICD-10-CM | POA: Insufficient documentation

## 2016-05-06 DIAGNOSIS — J45909 Unspecified asthma, uncomplicated: Secondary | ICD-10-CM | POA: Diagnosis not present

## 2016-05-06 DIAGNOSIS — F418 Other specified anxiety disorders: Secondary | ICD-10-CM | POA: Diagnosis not present

## 2016-05-06 HISTORY — DX: Fibromyalgia: M79.7

## 2016-05-06 HISTORY — DX: Hypothyroidism, unspecified: E03.9

## 2016-05-06 HISTORY — DX: Headache, unspecified: R51.9

## 2016-05-06 HISTORY — PX: DILATION AND EVACUATION: SHX1459

## 2016-05-06 HISTORY — DX: Anemia, unspecified: D64.9

## 2016-05-06 HISTORY — DX: Headache: R51

## 2016-05-06 LAB — CBC
HCT: 35.4 % — ABNORMAL LOW (ref 36.0–46.0)
HEMOGLOBIN: 12.5 g/dL (ref 12.0–15.0)
MCH: 30.9 pg (ref 26.0–34.0)
MCHC: 35.3 g/dL (ref 30.0–36.0)
MCV: 87.4 fL (ref 78.0–100.0)
Platelets: 220 10*3/uL (ref 150–400)
RBC: 4.05 MIL/uL (ref 3.87–5.11)
RDW: 12.9 % (ref 11.5–15.5)
WBC: 7.4 10*3/uL (ref 4.0–10.5)

## 2016-05-06 LAB — TYPE AND SCREEN
ABO/RH(D): AB NEG
ANTIBODY SCREEN: NEGATIVE

## 2016-05-06 SURGERY — DILATION AND EVACUATION, UTERUS
Anesthesia: Monitor Anesthesia Care

## 2016-05-06 MED ORDER — KETOROLAC TROMETHAMINE 30 MG/ML IJ SOLN
INTRAMUSCULAR | Status: AC
  Filled 2016-05-06: qty 1

## 2016-05-06 MED ORDER — LIDOCAINE HCL (CARDIAC) 20 MG/ML IV SOLN
INTRAVENOUS | Status: AC
  Filled 2016-05-06: qty 5

## 2016-05-06 MED ORDER — PROPOFOL 500 MG/50ML IV EMUL
INTRAVENOUS | Status: DC | PRN
  Administered 2016-05-06: 75 ug/kg/min via INTRAVENOUS

## 2016-05-06 MED ORDER — FENTANYL CITRATE (PF) 100 MCG/2ML IJ SOLN
INTRAMUSCULAR | Status: DC | PRN
  Administered 2016-05-06: 100 ug via INTRAVENOUS

## 2016-05-06 MED ORDER — PROMETHAZINE HCL 25 MG/ML IJ SOLN: 6.2500 mg | INTRAMUSCULAR | Status: DC | PRN

## 2016-05-06 MED ORDER — RHO D IMMUNE GLOBULIN 1500 UNIT/2ML IJ SOSY
300.0000 ug | PREFILLED_SYRINGE | Freq: Once | INTRAMUSCULAR | Status: AC
  Administered 2016-05-06: 300 ug via INTRAVENOUS
  Filled 2016-05-06: qty 2

## 2016-05-06 MED ORDER — FENTANYL CITRATE (PF) 100 MCG/2ML IJ SOLN
INTRAMUSCULAR | Status: AC
  Filled 2016-05-06: qty 2

## 2016-05-06 MED ORDER — SCOPOLAMINE 1 MG/3DAYS TD PT72
MEDICATED_PATCH | TRANSDERMAL | Status: DC
Start: 2016-05-06 — End: 2016-05-06
  Administered 2016-05-06: 1.5 mg via TRANSDERMAL
  Filled 2016-05-06: qty 1

## 2016-05-06 MED ORDER — LIDOCAINE HCL 1 % IJ SOLN
INTRAMUSCULAR | Status: DC | PRN
  Administered 2016-05-06: 20 mL

## 2016-05-06 MED ORDER — LACTATED RINGERS IV SOLN: INTRAVENOUS | Status: DC

## 2016-05-06 MED ORDER — DEXAMETHASONE SODIUM PHOSPHATE 4 MG/ML IJ SOLN
INTRAMUSCULAR | Status: AC
  Filled 2016-05-06: qty 1

## 2016-05-06 MED ORDER — MIDAZOLAM HCL 2 MG/2ML IJ SOLN
INTRAMUSCULAR | Status: AC
  Filled 2016-05-06: qty 2

## 2016-05-06 MED ORDER — DEXAMETHASONE SODIUM PHOSPHATE 10 MG/ML IJ SOLN
INTRAMUSCULAR | Status: DC | PRN
  Administered 2016-05-06: 4 mg via INTRAVENOUS

## 2016-05-06 MED ORDER — LIDOCAINE HCL 1 % IJ SOLN
INTRAMUSCULAR | Status: AC
  Filled 2016-05-06: qty 20

## 2016-05-06 MED ORDER — ONDANSETRON HCL 4 MG/2ML IJ SOLN
INTRAMUSCULAR | Status: AC
  Filled 2016-05-06: qty 2

## 2016-05-06 MED ORDER — SCOPOLAMINE 1 MG/3DAYS TD PT72
1.0000 | MEDICATED_PATCH | Freq: Once | TRANSDERMAL | Status: DC
  Administered 2016-05-06: 1.5 mg via TRANSDERMAL

## 2016-05-06 MED ORDER — PROPOFOL 500 MG/50ML IV EMUL
INTRAVENOUS | Status: DC | PRN
  Administered 2016-05-06: 70 mg via INTRAVENOUS

## 2016-05-06 MED ORDER — FENTANYL CITRATE (PF) 100 MCG/2ML IJ SOLN
25.0000 ug | INTRAMUSCULAR | Status: DC | PRN
  Administered 2016-05-06 (×2): 25 ug via INTRAVENOUS
  Administered 2016-05-06: 50 ug via INTRAVENOUS

## 2016-05-06 MED ORDER — ONDANSETRON HCL 4 MG/2ML IJ SOLN
INTRAMUSCULAR | Status: DC | PRN
  Administered 2016-05-06: 4 mg via INTRAVENOUS

## 2016-05-06 MED ORDER — LACTATED RINGERS IV SOLN
INTRAVENOUS | Status: DC
  Administered 2016-05-06: 125 mL/h via INTRAVENOUS

## 2016-05-06 MED ORDER — LIDOCAINE HCL (CARDIAC) 20 MG/ML IV SOLN
INTRAVENOUS | Status: DC | PRN
  Administered 2016-05-06: 100 mg via INTRAVENOUS

## 2016-05-06 MED ORDER — MIDAZOLAM HCL 2 MG/2ML IJ SOLN
INTRAMUSCULAR | Status: DC | PRN
  Administered 2016-05-06: 2 mg via INTRAVENOUS

## 2016-05-06 MED ORDER — PROPOFOL 10 MG/ML IV BOLUS
INTRAVENOUS | Status: AC
  Filled 2016-05-06: qty 20

## 2016-05-06 MED ORDER — SILVER NITRATE-POT NITRATE 75-25 % EX MISC
CUTANEOUS | Status: AC
  Filled 2016-05-06: qty 2

## 2016-05-06 MED ORDER — KETOROLAC TROMETHAMINE 30 MG/ML IJ SOLN
INTRAMUSCULAR | Status: DC | PRN
  Administered 2016-05-06: 30 mg via INTRAVENOUS

## 2016-05-06 SURGICAL SUPPLY — 19 items
CATH ROBINSON RED A/P 16FR (CATHETERS) ×3 IMPLANT
CLOTH BEACON ORANGE TIMEOUT ST (SAFETY) ×3 IMPLANT
DECANTER SPIKE VIAL GLASS SM (MISCELLANEOUS) ×3 IMPLANT
GLOVE BIO SURGEON STRL SZ 6.5 (GLOVE) ×1 IMPLANT
GLOVE BIO SURGEONS STRL SZ 6.5 (GLOVE)
GLOVE BIOGEL PI IND STRL 7.0 (GLOVE) ×1 IMPLANT
GLOVE BIOGEL PI INDICATOR 7.0 (GLOVE) ×2
GOWN STRL REUS W/TWL LRG LVL3 (GOWN DISPOSABLE) ×6 IMPLANT
KIT BERKELEY 1ST TRIMESTER 3/8 (MISCELLANEOUS) ×3 IMPLANT
NS IRRIG 1000ML POUR BTL (IV SOLUTION) ×3 IMPLANT
PACK VAGINAL MINOR WOMEN LF (CUSTOM PROCEDURE TRAY) ×3 IMPLANT
PAD OB MATERNITY 4.3X12.25 (PERSONAL CARE ITEMS) ×3 IMPLANT
PAD PREP 24X48 CUFFED NSTRL (MISCELLANEOUS) ×3 IMPLANT
SET BERKELEY SUCTION TUBING (SUCTIONS) ×3 IMPLANT
TOWEL OR 17X24 6PK STRL BLUE (TOWEL DISPOSABLE) ×6 IMPLANT
VACURETTE 10 RIGID CVD (CANNULA) IMPLANT
VACURETTE 7MM CVD STRL WRAP (CANNULA) IMPLANT
VACURETTE 8 RIGID CVD (CANNULA) IMPLANT
VACURETTE 9 RIGID CVD (CANNULA) IMPLANT

## 2016-05-06 NOTE — Op Note (Signed)
Operative Note    Preoperative Diagnosis Missed Abortion at 8 weeks size gestation  Postoperative Diagnosis same  Procedure Suction Dilation and Evacuation  Surgeon Huel CoteKathy Dyami Umbach  Anesthesia LMA  Fluids: EBL 50cc UOP 50cc straight cath prior IVF 800ccL R  Findings Moderate amount POC's. Uterus sounds to 9cm  Specimen POC's to pathology  Procedure Note Pt was taken to operating room where LMA anesthesia was obtained without difficulty.  She was prepped and draped in the normal sterile fashion in the dorsal lithotomy position.  An appropriate timeout was performed.  A speculum was placed in the vagina and 2cc of 1% plain lidocaine injected into the anterior cervix.  An additional 9cc was placed both at 2 and 10 o'clock for a paracervical block.  A single tooth tenaculum was placed on the anterior lip of the cervix. The cervix was slightly dilated from the pt's cytotec treatment and  sound was easily introduced into the uterus and the uterus sounded to about 8cm.  The 7mm suction currette was obtained and introduced into the fundus.  With several passes, a moderate amount of tissue was obtained.  When no further tissue was noted, the suction was discontinued and a gentle currettage performed.  One additional pass revealed no further tissue, and the procedure was completed.  The tenaculum was removed and silver nitrate used for hemostasis.  All instruments and sponges were removed from vagina and the patient awakened and taken to the PACU in good condition.

## 2016-05-06 NOTE — Progress Notes (Signed)
Patient ID: Gail Blackburn, female   DOB: [birth year], 40 y.o.   MRN: [id number] Pt here and denies changes in dictated H&P.  Brief exam WNL.  Tearful regarding loss.  Husband with her and mother to come visit for support.

## 2016-05-06 NOTE — Discharge Instructions (Addendum)
DISCHARGE INSTRUCTIONS: D&C / D&E The following instructions have been prepared to help you care for yourself upon your return home.   Personal hygiene:  Use sanitary pads for vaginal drainage, not tampons.  Shower the day after your procedure.  NO tub baths, pools or Jacuzzis for 2-3 weeks.  Wipe front to back after using the bathroom.  Activity and limitations:  Do NOT drive or operate any equipment for 24 hours. The effects of anesthesia are still present and drowsiness may result.  Do NOT rest in bed all day.  Walking is encouraged.  Walk up and down stairs slowly.  You may resume your normal activity in one to two days or as indicated by your physician.  Sexual activity: NO intercourse for at least 2 weeks after the procedure, or as indicated by your physician.  Diet: Eat a light meal as desired this evening. You may resume your usual diet tomorrow.  Return to work: You may resume your work activities in one to two days or as indicated by your doctor.  What to expect after your surgery: Expect to have vaginal bleeding/discharge for 2-3 days and spotting for up to 10 days. It is not unusual to have soreness for up to 1-2 weeks. You may have a slight burning sensation when you urinate for the first day. Mild cramps may continue for a couple of days. You may have a regular period in 2-6 weeks.  Call your doctor for any of the following:  Excessive vaginal bleeding, saturating and changing one pad every hour.  Inability to urinate 6 hours after discharge from hospital.  Pain not relieved by pain medication.  Fever of 100.4 F or greater.  Unusual vaginal discharge or odor.   Call for an appointment:    Patients signature: ______________________  Nurses signature ________________________  Support person's signature_______________________    Post Anesthesia Home Care Instructions  NO IBUPROFEN PRODUCTS UNTIL: 8:45 PM TONIGHT  Activity: Get plenty of rest  for the remainder of the day. A responsible adult should stay with you for 24 hours following the procedure.  For the next 24 hours, DO NOT: -Drive a car -Advertising copywriterperate machinery -Drink alcoholic beverages -Take any medication unless instructed by your physician -Make any legal decisions or sign important papers.  Meals: Start with liquid foods such as gelatin or soup. Progress to regular foods as tolerated. Avoid greasy, spicy, heavy foods. If nausea and/or vomiting occur, drink only clear liquids until the nausea and/or vomiting subsides. Call your physician if vomiting continues.  Special Instructions/Symptoms: Your throat may feel dry or sore from the anesthesia or the breathing tube placed in your throat during surgery. If this causes discomfort, gargle with warm salt water. The discomfort should disappear within 24 hours.  If you had a scopolamine patch placed behind your ear for the management of post- operative nausea and/or vomiting:  1. The medication in the patch is effective for 72 hours, after which it should be removed.  Wrap patch in a tissue and discard in the trash. Wash hands thoroughly with soap and water. 2. You may remove the patch earlier than 72 hours if you experience unpleasant side effects which may include dry mouth, dizziness or visual disturbances. 3. Avoid touching the patch. Wash your hands with soap and water after contact with the patch.

## 2016-05-06 NOTE — Transfer of Care (Signed)
Immediate Anesthesia Transfer of Care Note  Patient: Gail Blackburn  Procedure(s) Performed: Procedure(s): DILATATION AND EVACUATION (N/A)  Patient Location: PACU  Anesthesia Type:General  Level of Consciousness: awake, alert , oriented and patient cooperative  Airway & Oxygen Therapy: Patient Spontanous Breathing  Post-op Assessment: Report given to RN, Post -op Vital signs reviewed and stable and Patient moving all extremities X 4  Post vital signs: Reviewed and stable  Last Vitals:  Vitals:   05/06/16 1354 05/06/16 1505  BP: 115/71   Pulse: 79   Resp: 16 (!) (P) 21  Temp: 37.2 C (P) 36.4 C    Last Pain:  Vitals:   05/06/16 1354  TempSrc: Oral  PainSc: 1       Patients Stated Pain Goal: 3 (05/06/16 1354)  Complications: No apparent anesthesia complications

## 2016-05-06 NOTE — Anesthesia Postprocedure Evaluation (Signed)
Anesthesia Post Note  Patient: Gail Blackburn  Procedure(s) Performed: Procedure(s) (LRB): DILATATION AND EVACUATION (N/A)  Patient location during evaluation: PACU Anesthesia Type: MAC Level of consciousness: awake Pain management: pain level controlled Vital Signs Assessment: post-procedure vital signs reviewed and stable Respiratory status: spontaneous breathing Cardiovascular status: stable Postop Assessment: no signs of nausea or vomiting Anesthetic complications: no     Last Vitals:  Vitals:   05/06/16 1515 05/06/16 1615  BP: 111/79   Pulse: 88   Resp: 13 (P) 12  Temp:  (P) 36.9 C    Last Pain:  Vitals:   05/06/16 1615  TempSrc:   PainSc: (P) 1    Pain Goal: Patients Stated Pain Goal: (P) 3 (05/06/16 1615)               Jurnie Garritano JR,JOHN Susann GivensFRANKLIN

## 2016-05-06 NOTE — Anesthesia Preprocedure Evaluation (Signed)
Anesthesia Evaluation  Patient identified by MRN, date of birth, ID band Patient awake    Reviewed: Allergy & Precautions, NPO status , Patient's Chart, lab work & pertinent test results  Airway Mallampati: II  TM Distance: >3 FB Neck ROM: Full    Dental  (+) Teeth Intact, Dental Advisory Given   Pulmonary asthma , Current Smoker,    Pulmonary exam normal breath sounds clear to auscultation       Cardiovascular Exercise Tolerance: Good negative cardio ROS Normal cardiovascular exam Rhythm:Regular Rate:Normal     Neuro/Psych  Headaches, PSYCHIATRIC DISORDERS Anxiety Depression    GI/Hepatic negative GI ROS, Suboxone therapy   Endo/Other  Hypothyroidism   Renal/GU negative Renal ROS     Musculoskeletal  (+) Fibromyalgia - (gabapentin)  Abdominal   Peds  Hematology negative hematology ROS (+)   Anesthesia Other Findings Day of surgery medications reviewed with the patient.  Reproductive/Obstetrics (+) Pregnancy 40 y.o. female  [phone]  With missed abortion measuring about 8 weeks.                               Anesthesia Physical Anesthesia Plan  ASA: II  Anesthesia Plan: MAC   Post-op Pain Management:    Induction: Intravenous  Airway Management Planned: Nasal Cannula  Additional Equipment:   Intra-op Plan:   Post-operative Plan:   Informed Consent: I have reviewed the patients History and Physical, chart, labs and discussed the procedure including the risks, benefits and alternatives for the proposed anesthesia with the patient or authorized representative who has indicated his/her understanding and acceptance.   Dental advisory given  Plan Discussed with: CRNA and Anesthesiologist  Anesthesia Plan Comments: (Discussed risks/benefits/alternatives to MAC sedation including need for ventilatory support, hypotension, need for conversion to general anesthesia.  All patient  questions answered.  Patient/guardian wishes to proceed.)        Anesthesia Quick Evaluation

## 2016-05-07 LAB — RH IG WORKUP (INCLUDES ABO/RH)
ABO/RH(D): AB NEG
GESTATIONAL AGE(WKS): 8
UNIT DIVISION: 0

## 2016-05-09 ENCOUNTER — Encounter (HOSPITAL_COMMUNITY): Payer: Self-pay | Admitting: Obstetrics and Gynecology

## 2016-08-30 ENCOUNTER — Encounter: Payer: Self-pay | Admitting: Family Medicine

## 2016-08-30 ENCOUNTER — Ambulatory Visit (INDEPENDENT_AMBULATORY_CARE_PROVIDER_SITE_OTHER): Payer: Medicaid Other | Admitting: Family Medicine

## 2016-08-30 ENCOUNTER — Ambulatory Visit
Admission: RE | Admit: 2016-08-30 | Discharge: 2016-08-30 | Disposition: A | Discharge status: Home / Self Care | Payer: Medicaid Other | Source: Ambulatory Visit | Attending: Family Medicine | Admitting: Family Medicine

## 2016-08-30 VITALS — BP 84/60 | HR 93 | Temp 98.9°F | Ht 66.0 in | Wt 129.2 lb

## 2016-08-30 DIAGNOSIS — M546 Pain in thoracic spine: Secondary | ICD-10-CM | POA: Diagnosis not present

## 2016-08-30 DIAGNOSIS — G8929 Other chronic pain: Secondary | ICD-10-CM

## 2016-08-30 DIAGNOSIS — Z7189 Other specified counseling: Secondary | ICD-10-CM | POA: Diagnosis present

## 2016-08-30 DIAGNOSIS — R5383 Other fatigue: Secondary | ICD-10-CM

## 2016-08-30 LAB — TSH: TSH: 1.03 mIU/L

## 2016-08-30 MED ORDER — DICLOFENAC SODIUM 75 MG PO TBEC: 75.0000 mg | DELAYED_RELEASE_TABLET | Freq: Two times a day (BID) | ORAL | 0 refills | Status: DC

## 2016-08-30 NOTE — Progress Notes (Signed)
    Subjective: CC: back pain HPI: Patient is a 40 y.o. female presenting to clinic today for back pain. Concerns today include:  Back Pain Patient reports that pain began at least a year ago.  She reports that pain is intermittent but occurs at least weekly.  She notes that it is located in the left side of her upper back.  She has never seen anyone in the past for this, including chiropractor, massage therapy.  She notes that pain is relieved by applying pressure to area.  Pain is a 10/10 at the worst, 1/10 currently.  It does not radiate.  Anxiety seems to worsens pain.  Patient has been taking Motrin/Tylenol for pain with minimal relief.  Patient denies trauma or injury.  Does not use topicals or apply heat to area.    History Reviewed. Health Maintenance: Pap, TDap, HIV screen needed  Objective: Office vital signs reviewed. BP (!) 84/60   Pulse 93   Temp 98.9 F (37.2 C) (Oral)   Ht 5\' 6"  (1.676 m)   Wt 129 lb 3.2 oz (58.6 kg)   LMP 08/15/2016 (Approximate)   SpO2 98%   BMI 20.85 kg/m   Physical Examination:  General: Awake, alert, thin female, NAD Cardio: RRR, S1S2 heard, no murmurs appreciated Pulm: CTAB, no wheezes, rhonchi or rales, normal WOB on room air Extremities: WWP, No edema, cyanosis or clubbing; +2 pulses bilaterally MSK: Normal gait and station  Spine: Has full AROM of spine, + paraspinal TTP on the Left from T4-T6, it feels like she may have a prominent rib on the left at T5, no midline TTP. Neuro: UE Strength and light touch sensation grossly intact  Assessment/ Plan 40 y.o. female   1. Chronic left-sided thoracic back pain.  I suspect that she may have somatic dysfunction of ribs T5 and possibly T6 on the left.  We discussed various options including referral to ortho, sports medicine, PT and chiropractor.  Patient would like to pursue OMT.  Thoracic spine xray unremarkable.  NSAID provided.  Will avoid opioids and muscle relaxers at this time, given  history of opiate addiction, now being treated with Suboxone. - DG Thoracic Spine 2 View; Future - BASIC METABOLIC PANEL WITH GFR - diclofenac (VOLTAREN) 75 MG EC tablet; Take 1 tablet (75 mg total) by mouth 2 (two) times daily.  Dispense: 30 tablet; Refill: 0 - Ambulatory referral to Orthopedic Surgery, Dr Berline Choughigby - Follow up prn  2. Low energy.  Most likely related to depression/ psychiatric illness for which she is seeing a psychiatrist/ therapist.  Will evaluate for possible hypothyroidism. - TSH   Ashly Hulen SkainsM Gottschalk, DO PGY-3, [facility]

## 2016-08-30 NOTE — Patient Instructions (Signed)
Remember to take Voltaren with food. You should get a call from the orthopedist in the next week.  You are to see Dr Berline Choughigby for OMT (osteopathic manipulative therapy).

## 2016-08-31 ENCOUNTER — Encounter: Payer: Self-pay | Admitting: Family Medicine

## 2016-08-31 LAB — BASIC METABOLIC PANEL WITH GFR
BUN: 6 mg/dL — ABNORMAL LOW (ref 7–25)
CALCIUM: 9.6 mg/dL (ref 8.6–10.2)
CO2: 23 mmol/L (ref 20–31)
CREATININE: 0.75 mg/dL (ref 0.50–1.10)
Chloride: 105 mmol/L (ref 98–110)
GFR, Est African American: >89 mL/min (ref >=60)
GFR, Est Non African American: >89 mL/min (ref >=60)
GLUCOSE: 85 mg/dL (ref 65–99)
Potassium: 4.3 mmol/L (ref 3.5–5.3)
Sodium: 140 mmol/L (ref 135–146)

## 2016-09-08 ENCOUNTER — Ambulatory Visit (INDEPENDENT_AMBULATORY_CARE_PROVIDER_SITE_OTHER): Payer: Medicaid Other | Admitting: Sports Medicine

## 2016-09-08 ENCOUNTER — Encounter (INDEPENDENT_AMBULATORY_CARE_PROVIDER_SITE_OTHER): Payer: Self-pay | Admitting: Sports Medicine

## 2016-09-08 VITALS — BP 108/73 | HR 79 | Ht 65.0 in | Wt 128.0 lb

## 2016-09-08 DIAGNOSIS — M9902 Segmental and somatic dysfunction of thoracic region: Secondary | ICD-10-CM | POA: Diagnosis not present

## 2016-09-08 DIAGNOSIS — G894 Chronic pain syndrome: Secondary | ICD-10-CM | POA: Diagnosis not present

## 2016-09-08 DIAGNOSIS — M9901 Segmental and somatic dysfunction of cervical region: Secondary | ICD-10-CM

## 2016-09-08 DIAGNOSIS — M9908 Segmental and somatic dysfunction of rib cage: Secondary | ICD-10-CM

## 2016-09-08 DIAGNOSIS — M9903 Segmental and somatic dysfunction of lumbar region: Secondary | ICD-10-CM

## 2016-09-09 NOTE — Procedures (Signed)
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 Verbal consent was obtained after after explanation of risks, benefits and potential side effects, including acute pain flare, post manipulation soreness and need for repeat treatments.  If Cervical manipulation was performed additional time was spent discussing the associated minimal risk of  injury to neurovascular structures.  After consent was obtained manipulation was performed as below:            Regions treated:  Per billing codes          Techniques used:  Muscle Energy, MFR, FPR, HVLA, OCT, CS  and ART The patient tolerated the treatment well and reported Improved symptoms following treatment today. Patient was given medications, exercises, stretches and lifestyle modifications per AVS and verbally.

## 2016-09-09 NOTE — Progress Notes (Signed)
Gail Blackburn - 40 y.o. female MRN [id number]  Date of birth: January 06, 1976  Office Visit Note: Visit Date: 09/08/2016 PCP: Delynn FlavinAshly Gottschalk, DO Referred by: Raliegh IpGottschalk, Ashly M, DO  Subjective: Chief Complaint  Patient presents with  . Lower Back - New Patient (Initial Visit)  . Follow-up    Patient states back pain for over a year.  Neck pain for over 5years or more and has gotten worse.  Back pain does radiate to left leg at times, has numbness and tingling, hurts to walk, sit and stand.   Neck pain radiates into both shoulders.     HPI: Symptoms as above. Pain is mainly in the left upper thoracic region. Occasional bilateral lower pelvic pain but this is less of an issue. Pain seems to be a constant knot in her back. She is tried multiple medications. No specific exercises. She denies any changes in vision. No unilateral weakness. No changes in bowel or bladder. ROS: Otherwise per HPI.   Clinical History: No specialty comments available.  She reports that she has been smoking Cigarettes.  She has a 45.00 pack-year smoking history. She has never used smokeless tobacco.  No results for input(s): HGBA1C, LABURIC in the last 8760 hours.  Assessment & Plan: Visit Diagnoses: No diagnosis found.  Plan:   >50% of this 30 minute visit spent in direct patient counseling and/or coordination of care. Discussion was focused on education regarding the in discussing the pathoetiology and anticipated clinical course of the above condition.  Therapeutic exercises as well as osteopathic manipulation were performed today & not billed separately. Unfortunately due to Medicaid's coverage policies osteopathic manipulation is a covered service however chiropractic isn't recommended referral to chiropractic care if she does well with this. I discussed that I am happy to continue to see her on a private basis or with private insurance if she does obtain this however at this time she will need to follow up with a  different provider. Therapeutic exercises provided to the patient today including towel stretches & AAOS spine conditioning program. Patient voices understanding & is appreciative of the visit today. Follow-up: No Follow-up on file.  Meds: No orders of the defined types were placed in this encounter.  Procedures: No notes on file   Objective:  VS:  HT:5\' 5"  (165.1 cm)   WT:128 lb (58.1 kg)  BMI:21.3    BP:108/73  HR:79bpm  TEMP: ( )  RESP:  Physical Exam:  Adult female. Alert and appropriate.  In no acute distress.  Upper extremities are overall well aligned with no significant deformity. No significant swelling.  Distal pulses 2+/4. No significant bruising/ecchymosis or erythema the skin Back & neck: Overall well aligned. No significant scoliosis or torticollis. She has good range of motion of the neck with limited side bending & rotation mainly to the right. She has a palpable muscle spasm in osteopathic findings within the thoracic spine, cervical spine low back & rib cage. Vertebrobasilar artery testing is asymptomatic & normal. Imaging: Dg Thoracic Spine 2 View  Result Date: 08/30/2016 CLINICAL DATA:  Thoracic spine pain between the scapulae for 1 year. EXAM: THORACIC SPINE 2 VIEWS COMPARISON:  None. FINDINGS: There are 12 thoracic type vertebrae bearing full sized ribs. Vertebral alignment is normal. Vertebral body heights are preserved. No fracture or destructive osseous lesion is seen. The visualized portions of the lungs are clear. IMPRESSION: Negative. Electronically Signed   By: Sebastian AcheAllen  Grady M.D.   On: 08/30/2016 13:45    Past Medical/Family/Surgical/Social  History: Medications & Allergies reviewed per EMR Patient Active Problem List   Diagnosis Date Noted  . Pregnancy 04/12/2016  . Anxiety 10/15/2014  . Depression 10/15/2014  . Migraine 10/15/2014  . Chronic pain 10/15/2014  . Low TSH level 10/15/2014  . Tobacco use disorder 10/15/2014   Past Medical History:    Diagnosis Date  . Anemia    PRIOR HISTORY  . Anxiety   . Asthma   . Depression   . Fibromyalgia   . Headache    MIGRAINES  . Hypothyroidism 2009   TOOK MEDS FOR FEW WEEKS NO MEDS NOW  . Thyroid disease    treated in past not sure if too high or too low   Family History  Problem Relation Age of Onset  . Bipolar disorder Mother   . Alcohol abuse Mother   . Hyperlipidemia Mother   . Heart disease Maternal Grandmother   . Stroke Maternal Grandmother   . Leukemia Maternal Grandmother   . Hyperlipidemia Maternal Grandmother   . Hypertension Maternal Grandmother   . Graves' disease Cousin   . Alcohol abuse Maternal Aunt   . Drug abuse Maternal Aunt   . Asthma Maternal Aunt   . COPD Maternal Aunt   . Bipolar disorder Maternal Aunt   . Hyperlipidemia Maternal Aunt    Past Surgical History:  Procedure Laterality Date  . COLONOSCOPY    . DILATION AND EVACUATION N/A 05/06/2016   Procedure: DILATATION AND EVACUATION;  Surgeon: Huel CoteKathy Richardson, MD;  Location: [facility];  Service: Gynecology;  Laterality: N/A;  . TEETH PULLED  03/2016  . WISDOM TOOTH EXTRACTION     Social History   Occupational History  . Not on file.   Social History Main Topics  . Smoking status: Current Every Day Smoker    Packs/day: 3.00    Years: 15.00    Types: Cigarettes  . Smokeless tobacco: Never Used  . Alcohol use No  . Drug use: No  . Sexual activity: Yes    Birth control/ protection: Injection

## 2016-09-15 ENCOUNTER — Telehealth (INDEPENDENT_AMBULATORY_CARE_PROVIDER_SITE_OTHER): Payer: Self-pay | Admitting: *Deleted

## 2016-09-15 DIAGNOSIS — M546 Pain in thoracic spine: Secondary | ICD-10-CM

## 2016-09-15 NOTE — Telephone Encounter (Signed)
Talked with patient and advised her of message concerning Referral for a chiropractic.

## 2016-09-15 NOTE — Telephone Encounter (Signed)
Patient was intended to be referred to chiropractic to be covered under Medicaid.  This order has been placed.  I believe both [facility] and [facility] are Medicaid providers that would be appropriate to refer to.

## 2016-09-15 NOTE — Telephone Encounter (Signed)
Pt called stating she is being referred to a orthopedic surgeon, but hasn't been contacted yet

## 2016-09-15 NOTE — Telephone Encounter (Signed)
Talked with patient and she stated that she was suppose to receive a call this week concerning an appointment with an orthopedic surgeon.  Please Advise. Thank You

## 2016-10-18 ENCOUNTER — Telehealth: Payer: Self-pay | Admitting: Family Medicine

## 2016-10-18 NOTE — Telephone Encounter (Signed)
Pt went to ortho in dec for back and neck problems. Dr stated she couldn't continue to be seen because medicaid doesn't cover ortho services.she is wondering if there is any type of pain med that can help with the inflammations. Please advise.  She also wants  to see what her lab results wee.  Did she get tested for arthritis factors? Please advsei

## 2016-10-18 NOTE — Telephone Encounter (Signed)
Spoke to patient.  Will plan to further discuss options.

## 2016-10-19 ENCOUNTER — Other Ambulatory Visit: Payer: Self-pay | Admitting: Family Medicine

## 2016-10-19 MED ORDER — CELECOXIB 100 MG PO CAPS
100.0000 mg | ORAL_CAPSULE | Freq: Two times a day (BID) | ORAL | 0 refills | Status: DC
Start: 2016-10-19 — End: 2016-11-28

## 2016-10-19 NOTE — Progress Notes (Signed)
Spoke to Dr Raymondo BandKoval and patient.  Will plan to initiate Celebrex at 100mg  BID w/f.  Reviewed avoidance of NSAIDs while taking this medication.  Patient to follow up in 1 month with me for her neck and back pain.  Ashly M. Nadine CountsGottschalk, DO PGY-3, [facility]

## 2016-11-22 ENCOUNTER — Telehealth: Payer: Self-pay | Admitting: Family Medicine

## 2016-11-22 NOTE — Telephone Encounter (Signed)
Pt called because she needs a referral for PT since Medicaid will no longer cover her Chiropractor visits. Please let patient know when this is done. jw

## 2016-11-24 ENCOUNTER — Other Ambulatory Visit: Payer: Self-pay | Admitting: Family Medicine

## 2016-11-24 DIAGNOSIS — G8929 Other chronic pain: Secondary | ICD-10-CM

## 2016-11-24 DIAGNOSIS — M549 Dorsalgia, unspecified: Principal | ICD-10-CM

## 2016-11-24 NOTE — Telephone Encounter (Signed)
Done

## 2016-11-28 ENCOUNTER — Other Ambulatory Visit: Payer: Self-pay | Admitting: Family Medicine

## 2016-12-07 ENCOUNTER — Ambulatory Visit: Payer: Medicaid Other | Attending: Family Medicine | Admitting: Physical Therapy

## 2016-12-07 DIAGNOSIS — M542 Cervicalgia: Secondary | ICD-10-CM | POA: Diagnosis present

## 2016-12-07 DIAGNOSIS — M5442 Lumbago with sciatica, left side: Secondary | ICD-10-CM | POA: Diagnosis present

## 2016-12-07 DIAGNOSIS — G8929 Other chronic pain: Secondary | ICD-10-CM | POA: Diagnosis present

## 2016-12-07 DIAGNOSIS — M62838 Other muscle spasm: Secondary | ICD-10-CM | POA: Diagnosis present

## 2016-12-07 DIAGNOSIS — M6281 Muscle weakness (generalized): Secondary | ICD-10-CM | POA: Insufficient documentation

## 2016-12-08 NOTE — Therapy (Signed)
[facility] [address] [place], [place], [address] Phone: [phone]   Fax:  [phone]  Physical Therapy Evaluation  Patient Details  Name: Gail Blackburn MRN: [id number] Date of Birth: [birth year] Referring Provider: Delynn Flavin   Encounter Date: 12/07/2016      PT End of Session - 12/07/16 1456    Visit Number 1   Number of Visits 1   Date for PT Re-Evaluation 12/10/16   Authorization Type continue with HEP    PT Start Time 1330   PT Stop Time 1426   PT Time Calculation (min) 56 min   Activity Tolerance Patient tolerated treatment well   Behavior During Therapy [facility] for tasks assessed/performed      Past Medical History:  Diagnosis Date  . Anemia    PRIOR HISTORY  . Anxiety   . Asthma   . Depression   . Fibromyalgia   . Headache    MIGRAINES  . Hypothyroidism 2009   TOOK MEDS FOR FEW WEEKS NO MEDS NOW  . Thyroid disease    treated in past not sure if too high or too low    Past Surgical History:  Procedure Laterality Date  . COLONOSCOPY    . DILATION AND EVACUATION N/A 05/06/2016   Procedure: DILATATION AND EVACUATION;  Surgeon: Huel Cote, MD;  Location: [facility];  Service: Gynecology;  Laterality: N/A;  . TEETH PULLED  03/2016  . WISDOM TOOTH EXTRACTION      There were no vitals filed for this visit.       Subjective Assessment - 12/07/16 1337    Subjective Patient has been having neck pain since 2012. The pain gets worse with stress. She has low back pain that shoots down her left side. her pain in her back incfreases when she lies on her left side.    Limitations House hold activities;Lifting   How long can you sit comfortably? No limit    Diagnostic tests X-ray: straight alignment    Patient Stated Goals to have less pain in her neck    Currently in Pain? Yes   Pain Score 10-Worst pain ever   Pain Location Neck   Pain Orientation Right;Left   Pain Descriptors / Indicators Aching   Pain Type Chronic pain   Pain Onset More than a month ago   Pain Frequency Constant   Aggravating Factors  activity    Pain Relieving Factors rest    Effect of Pain on Daily Activities difficulty perfroming ADL's             [facility] PT Assessment - 12/08/16 0001      Assessment   Medical Diagnosis Cervical spine pain    Referring Provider Delynn Flavin    Onset Date/Surgical Date --  2012   Hand Dominance Right   Next MD Visit Nothing schedueled `   Prior Therapy None Scheduled      Precautions   Precautions None     Restrictions   Weight Bearing Restrictions No     Balance Screen   Has the patient fallen in the past 6 months No   Has the patient had a decrease in activity level because of a fear of falling?  No   Is the patient reluctant to leave their home because of a fear of falling?  No     Cognition   Overall Cognitive Status Within Functional Limits for tasks assessed   Attention Focused   Focused Attention Appears intact  Memory Appears intact   Awareness Appears intact   Problem Solving Appears intact     Sensation   Light Touch Appears Intact     ROM / Strength   AROM / PROM / Strength AROM;PROM;Strength     AROM   AROM Assessment Site Cervical;Lumbar   Cervical Flexion 25   Cervical Extension 30   Cervical - Right Rotation 44   Cervical - Left Rotation 45   Lumbar Flexion 50% limited with increased radicualr symptom    Lumbar Extension 50% limited      PROM   Overall PROM Comments limited passive hip flexion and IR bilateral      Strength   Strength Assessment Site Shoulder;Lumbar;Hip   Right/Left Shoulder Right;Left   Right Shoulder Flexion 4+/5   Right Shoulder ABduction 4+/5   Left Shoulder Flexion 4+/5   Left Shoulder ABduction 4/5   Right/Left Hip Right;Left   Right Hip Extension 4/5   Left Hip Flexion 4/5     Palpation   Palpation comment significant spasming of the upper traps into bilateral medial peri-scapular area       Special Tests    Special Tests Cervical   Cervical Tests Spurling's     Spurling's   Comment (-)                            PT Education - 12/07/16 1341    Education provided Yes   Education Details symptom mangement, HEP, soft tisue mobilization    Person(s) Educated Patient   Methods Explanation;Demonstration;Tactile cues;Verbal cues;Handout   Comprehension Verbalized understanding;Returned demonstration          PT Short Term Goals - 12/07/16 1507      PT SHORT TERM GOAL #1   Title Patient will be indepdnent with HEP    Time 4   Period Weeks   Status Achieved                  Plan - 12/07/16 1458    Clinical Impression Statement Patient is a 41 year old female with cervical spine pain and lumbar pain. She rpresents with significant spasming of the upper traps and limited cervical mobility. She has poor posture. She reports increased pain when she is stressed. Therapy discussed relaxation techiques. Her hips are tight and week. She was given a complete HEP with education on the importance of symptom mangement. She was advised to pick 2 stretches for her neck and 2 stretches for her back at first and work her way through the program.    Rehab Potential Good   PT Frequency 2x / week   PT Duration 8 weeks   PT Treatment/Interventions ADLs/Self Care Home Management;Therapeutic activities;Therapeutic exercise;Patient/family education;Passive range of motion;Manual techniques   PT Next Visit Plan 1x visit per medicaid    PT Home Exercise Plan see patient instructions       Patient will benefit from skilled therapeutic intervention in order to improve the following deficits and impairments:  Pain, Decreased strength, Postural dysfunction, Impaired UE functional use, Increased muscle spasms, Decreased activity tolerance, Decreased endurance  Visit Diagnosis: Cervicalgia - Plan: PT plan of care cert/re-cert  Other muscle spasm - Plan: PT plan of  care cert/re-cert  Muscle weakness (generalized) - Plan: PT plan of care cert/re-cert  Chronic bilateral low back pain with left-sided sciatica - Plan: PT plan of care cert/re-cert     Problem List Patient Active Problem List  Diagnosis Date Noted  . Pregnancy 04/12/2016  . Anxiety 10/15/2014  . Depression 10/15/2014  . Migraine 10/15/2014  . Chronic pain 10/15/2014  . Low TSH level 10/15/2014  . Tobacco use disorder 10/15/2014    Dessie Coma PT DPT  12/08/2016, 8:21 AM  [facility] [address] [place], [place], [address] Phone: [phone]   Fax:  [phone]  Name: Gail Blackburn MRN: [id number] Date of Birth: August 11, 1976

## 2016-12-14 NOTE — Telephone Encounter (Signed)
Spoke to pt. Had one PT appt last week. (Medicaid would only pay for 1). Made an annual exam appt. Sunday SpillersSharon T Maynard David, CMA

## 2016-12-26 ENCOUNTER — Ambulatory Visit: Payer: Medicaid Other | Admitting: Family Medicine

## 2017-01-05 ENCOUNTER — Ambulatory Visit: Payer: Medicaid Other | Admitting: Family Medicine

## 2017-02-10 ENCOUNTER — Ambulatory Visit (INDEPENDENT_AMBULATORY_CARE_PROVIDER_SITE_OTHER): Payer: Medicaid Other | Admitting: Family Medicine

## 2017-02-10 ENCOUNTER — Encounter: Payer: Self-pay | Admitting: Family Medicine

## 2017-02-10 VITALS — BP 102/58 | HR 96 | Temp 98.7°F | Ht 65.0 in | Wt 134.2 lb

## 2017-02-10 DIAGNOSIS — L989 Disorder of the skin and subcutaneous tissue, unspecified: Secondary | ICD-10-CM | POA: Insufficient documentation

## 2017-02-10 DIAGNOSIS — Z23 Encounter for immunization: Secondary | ICD-10-CM

## 2017-02-10 DIAGNOSIS — Z8742 Personal history of other diseases of the female genital tract: Secondary | ICD-10-CM | POA: Insufficient documentation

## 2017-02-10 DIAGNOSIS — F17200 Nicotine dependence, unspecified, uncomplicated: Secondary | ICD-10-CM

## 2017-02-10 DIAGNOSIS — Z0000 Encounter for general adult medical examination without abnormal findings: Secondary | ICD-10-CM

## 2017-02-10 DIAGNOSIS — Z87898 Personal history of other specified conditions: Secondary | ICD-10-CM

## 2017-02-10 MED ORDER — CELECOXIB 100 MG PO CAPS: 100.0000 mg | ORAL_CAPSULE | Freq: Two times a day (BID) | ORAL | 0 refills | Status: DC

## 2017-02-10 NOTE — Assessment & Plan Note (Signed)
Possibly a basal cell.  Patient was not amenable to biopsy today, as she has another appt to be at.  She does want a referral to Dermatology for further evaluation of this lesion and a hyperpigmentation on her forehead.  Return precautions reviewed.

## 2017-02-10 NOTE — Progress Notes (Signed)
Gail Blackburn is a 41 y.o. female presents to office today for annual physical exam examination.  Concerns today include:  Skin concern She reports that she has hyperpigmentation on her forehead.  She reports that area seems to be more prominent.  She has an area on her left anterior leg that will bleed when she shaves/ if it gets hit.  She denies spontaneous bleeding.  She thinks it may be getting bigger.  She reports maternal aunt (mother's twin) who has a h/o skin cancer (not melanoma).  No personal skin cancer.  Uses OCPs and abstinence.  She reports a miscarriage last October.  Last pap smear: going to get done Dr Huel CoteKathy Richardson, [facility].  She has had a h/o abnormal pap in the past which required biopsy. Immunizations needed: Tdap Vaccine: yes   Past Medical History:  Diagnosis Date  . Anemia    PRIOR HISTORY  . Anxiety   . Asthma   . Depression   . Fibromyalgia   . Headache    MIGRAINES  . Hypothyroidism 2009   TOOK MEDS FOR FEW WEEKS NO MEDS NOW  . Thyroid disease    treated in past not sure if too high or too low   Social History   Social History  . Marital status: Single    Spouse name: N/A  . Number of children: N/A  . Years of education: N/A   Occupational History  . Not on file.   Social History Main Topics  . Smoking status: Current Every Day Smoker    Packs/day: 3.00    Years: 15.00    Types: Cigarettes  . Smokeless tobacco: Never Used  . Alcohol use No  . Drug use: No  . Sexual activity: Yes    Birth control/ protection: Injection   Other Topics Concern  . Not on file   Social History Narrative  . No narrative on file   Past Surgical History:  Procedure Laterality Date  . COLONOSCOPY    . DILATION AND EVACUATION N/A 05/06/2016   Procedure: DILATATION AND EVACUATION;  Surgeon: Huel CoteKathy Richardson, MD;  Location: [facility];  Service: Gynecology;  Laterality: N/A;  . TEETH PULLED  03/2016  . WISDOM TOOTH EXTRACTION     Family History  Problem  Relation Age of Onset  . Bipolar disorder Mother   . Alcohol abuse Mother   . Hyperlipidemia Mother   . Heart disease Maternal Grandmother   . Stroke Maternal Grandmother   . Leukemia Maternal Grandmother   . Hyperlipidemia Maternal Grandmother   . Hypertension Maternal Grandmother   . Graves' disease Cousin   . Alcohol abuse Maternal Aunt   . Drug abuse Maternal Aunt   . Asthma Maternal Aunt   . COPD Maternal Aunt   . Bipolar disorder Maternal Aunt   . Hyperlipidemia Maternal Aunt     ROS: Review of Systems Constitutional: negative Eyes: positive for contacts/glasses Ears, nose, mouth, throat, and face: positive for artificial teeth Respiratory: positive for SOB associated with panic attacks Cardiovascular: negative Gastrointestinal: negative Genitourinary:positive for recent miscarriage and irregular menstrual cycle. no abnormal vaginal discharge.  Integument/breast: positive for skin lesion(s) and see HPI Hematologic/lymphatic: negative Musculoskeletal:positive for back pain Neurological: negative Behavioral/Psych: positive for anxiety and depression Endocrine: negative Allergic/Immunologic: negative    Physical exam BP (!) 102/58   Pulse 96   Temp 98.7 F (37.1 C) (Oral)   Ht 5\' 5"  (1.651 m)   Wt 134 lb 3.2 oz (60.9 kg)  LMP 02/10/2017 (Exact Date)   SpO2 98%   BMI 22.33 kg/m  General appearance: alert, cooperative, appears stated age and no distress Head: Normocephalic, without obvious abnormality, atraumatic Eyes: negative findings: lids and lashes normal, conjunctivae and sclerae normal, corneas clear and pupils equal, round, reactive to light and accomodation Ears: normal TM's and external ear canals both ears Nose: Nares normal. Septum midline. Mucosa normal. No drainage or sinus tenderness. Throat: lips, mucosa, and tongue normal; teeth and gums normal Neck: no adenopathy, supple, symmetrical, trachea midline and thyroid not enlarged, symmetric, no  tenderness/mass/nodules Back: symmetric, no curvature. ROM normal. No CVA tenderness. Lungs: clear to auscultation bilaterally Heart: regular rate and rhythm, S1, S2 normal, no murmur, click, rub or gallop Abdomen: soft, non-tender; bowel sounds normal; no masses,  no organomegaly Extremities: extremities normal, atraumatic, no cyanosis or edema Pulses: 2+ and symmetric Skin: left anteriolateral shin with 3mm x28mm slightly umbilicated flesh colored lesion.  No bleeding or exudate.  No surrounding erythema. Lymph nodes: Cervical, supraclavicular, and axillary nodes normal. Neurologic: Grossly normal   Assessment/ Plan: Gail Slim here for annual physical exam.  December labs reviewed.  No labs needed today.  TDap administered today.  Pap to be performed at her GYN's office.  She will have GYN send over results/ notes.  Skin lesion of left leg Possibly a basal cell.  Patient was not amenable to biopsy today, as she has another appt to be at.  She does want a referral to Dermatology for further evaluation of this lesion and a hyperpigmentation on her forehead.  Return precautions reviewed.  Tobacco use disorder Still smoking 1 cig/ day w/ vaping in between.  Smoking cessation counseling performed.   History of abnormal cervical Pap smear Followed by Dr Huel Cote, at [facility].  She will have pap/ pelvic exam done there next month.  She will get notes sent over to our office.  Mayling Aber M. Nadine Counts, DO PGY-3, [facility]

## 2017-02-10 NOTE — Patient Instructions (Signed)
Steps to Quit Smoking Smoking tobacco can be harmful to your health and can affect almost every organ in your body. Smoking puts you, and those around you, at risk for developing many serious chronic diseases. Quitting smoking is difficult, but it is one of the best things that you can do for your health. It is never too late to quit. What are the benefits of quitting smoking? When you quit smoking, you lower your risk of developing serious diseases and conditions, such as:  Lung cancer or lung disease, such as COPD.  Heart disease.  Stroke.  Heart attack.  Infertility.  Osteoporosis and bone fractures.  Additionally, symptoms such as coughing, wheezing, and shortness of breath may get better when you quit. You may also find that you get sick less often because your body is stronger at fighting off colds and infections. If you are pregnant, quitting smoking can help to reduce your chances of having a baby of low birth weight. How do I get ready to quit? When you decide to quit smoking, create a plan to make sure that you are successful. Before you quit:  Pick a date to quit. Set a date within the next two weeks to give you time to prepare.  Write down the reasons why you are quitting. Keep this list in places where you will see it often, such as on your bathroom mirror or in your car or wallet.  Identify the people, places, things, and activities that make you want to smoke (triggers) and avoid them. Make sure to take these actions: ? Throw away all cigarettes at home, at work, and in your car. ? Throw away smoking accessories, such as ashtrays and lighters. ? Clean your car and make sure to empty the ashtray. ? Clean your home, including curtains and carpets.  Tell your family, friends, and coworkers that you are quitting. Support from your loved ones can make quitting easier.  Talk with your health care provider about your options for quitting smoking.  Find out what treatment  options are covered by your health insurance.  What strategies can I use to quit smoking? Talk with your healthcare provider about different strategies to quit smoking. Some strategies include:  Quitting smoking altogether instead of gradually lessening how much you smoke over a period of time. Research shows that quitting "cold turkey" is more successful than gradually quitting.  Attending in-person counseling to help you build problem-solving skills. You are more likely to have success in quitting if you attend several counseling sessions. Even short sessions of 10 minutes can be effective.  Finding resources and support systems that can help you to quit smoking and remain smoke-free after you quit. These resources are most helpful when you use them often. They can include: ? Online chats with a counselor. ? Telephone quitlines. ? Printed self-help materials. ? Support groups or group counseling. ? Text messaging programs. ? Mobile phone applications.  Taking medicines to help you quit smoking. (If you are pregnant or breastfeeding, talk with your health care provider first.) Some medicines contain nicotine and some do not. Both types of medicines help with cravings, but the medicines that include nicotine help to relieve withdrawal symptoms. Your health care provider may recommend: ? Nicotine patches, gum, or lozenges. ? Nicotine inhalers or sprays. ? Non-nicotine medicine that is taken by mouth.  Talk with your health care provider about combining strategies, such as taking medicines while you are also receiving in-person counseling. Using these two strategies together   makes you more likely to succeed in quitting than if you used either strategy on its own. If you are pregnant or breastfeeding, talk with your health care provider about finding counseling or other support strategies to quit smoking. Do not take medicine to help you quit smoking unless told to do so by your health care  provider. What things can I do to make it easier to quit? Quitting smoking might feel overwhelming at first, but there is a lot that you can do to make it easier. Take these important actions:  Reach out to your family and friends and ask that they support and encourage you during this time. Call telephone quitlines, reach out to support groups, or work with a counselor for support.  Ask people who smoke to avoid smoking around you.  Avoid places that trigger you to smoke, such as bars, parties, or smoke-break areas at work.  Spend time around people who do not smoke.  Lessen stress in your life, because stress can be a smoking trigger for some people. To lessen stress, try: ? Exercising regularly. ? Deep-breathing exercises. ? Yoga. ? Meditating. ? Performing a body scan. This involves closing your eyes, scanning your body from head to toe, and noticing which parts of your body are particularly tense. Purposefully relax the muscles in those areas.  Download or purchase mobile phone or tablet apps (applications) that can help you stick to your quit plan by providing reminders, tips, and encouragement. There are many free apps, such as QuitGuide from the CDC (Centers for Disease Control and Prevention). You can find other support for quitting smoking (smoking cessation) through smokefree.gov and other websites.  How will I feel when I quit smoking? Within the first 24 hours of quitting smoking, you may start to feel some withdrawal symptoms. These symptoms are usually most noticeable 2-3 days after quitting, but they usually do not last beyond 2-3 weeks. Changes or symptoms that you might experience include:  Mood swings.  Restlessness, anxiety, or irritation.  Difficulty concentrating.  Dizziness.  Strong cravings for sugary foods in addition to nicotine.  Mild weight gain.  Constipation.  Nausea.  Coughing or a sore throat.  Changes in how your medicines work in your  body.  A depressed mood.  Difficulty sleeping (insomnia).  After the first 2-3 weeks of quitting, you may start to notice more positive results, such as:  Improved sense of smell and taste.  Decreased coughing and sore throat.  Slower heart rate.  Lower blood pressure.  Clearer skin.  The ability to breathe more easily.  Fewer sick days.  Quitting smoking is very challenging for most people. Do not get discouraged if you are not successful the first time. Some people need to make many attempts to quit before they achieve long-term success. Do your best to stick to your quit plan, and talk with your health care provider if you have any questions or concerns. This information is not intended to replace advice given to you by your health care provider. Make sure you discuss any questions you have with your health care provider. Document Released: 09/06/2001 Document Revised: 05/10/2016 Document Reviewed: 01/27/2015 Elsevier Interactive Patient Education  2017 Elsevier Inc.  

## 2017-02-10 NOTE — Assessment & Plan Note (Signed)
Follow[facility].  She will have pap/ pelvic exam done there next month.  She will get notes sent over to our office.

## 2017-02-10 NOTE — Assessment & Plan Note (Signed)
Still smoking 1 cig/ day w/ vaping in between.  Smoking cessation counseling performed.

## 2017-02-17 ENCOUNTER — Telehealth: Payer: Self-pay | Admitting: Family Medicine

## 2017-02-17 NOTE — Telephone Encounter (Signed)
Pt has not been contacted by the dermatologist.  Pts therapist suggested she might have chronic fatigue syndrone.  appt was made to see dr Nadine Countsgottschalk

## 2017-02-17 NOTE — Telephone Encounter (Signed)
Will ask Tia to look into this but it appears [facility] and should contact her w/ appt.

## 2017-02-17 NOTE — Telephone Encounter (Signed)
This referral was just processed yesterday. Pt will be contacted within the next 1-2 weeks to schedule.

## 2017-02-24 ENCOUNTER — Ambulatory Visit (INDEPENDENT_AMBULATORY_CARE_PROVIDER_SITE_OTHER): Payer: Medicaid Other | Admitting: Family Medicine

## 2017-02-24 ENCOUNTER — Encounter: Payer: Self-pay | Admitting: Family Medicine

## 2017-02-24 VITALS — BP 98/74 | HR 94 | Temp 98.8°F | Ht 65.0 in | Wt 138.6 lb

## 2017-02-24 DIAGNOSIS — M797 Fibromyalgia: Secondary | ICD-10-CM | POA: Insufficient documentation

## 2017-02-24 DIAGNOSIS — G43909 Migraine, unspecified, not intractable, without status migrainosus: Secondary | ICD-10-CM | POA: Diagnosis not present

## 2017-02-24 DIAGNOSIS — G9332 Myalgic encephalomyelitis/chronic fatigue syndrome: Secondary | ICD-10-CM | POA: Insufficient documentation

## 2017-02-24 DIAGNOSIS — R5382 Chronic fatigue, unspecified: Secondary | ICD-10-CM | POA: Diagnosis not present

## 2017-02-24 NOTE — Assessment & Plan Note (Signed)
Seemingly refractory to several medications in the past.  Will refer to Neurology for further assessment/ management.

## 2017-02-24 NOTE — Patient Instructions (Signed)
I have placed a referral to neurology.  Unfortunately the headache clinic is not covered by medicaid.   I have ordered vitamin B 12 and vitamin D levels today.  If either of these are low, they can contribute to low energy. As far as the chronic fatigue syndrome is concerned, you are doing all of the right things to try and improve your energy.  The gold standard treatment is therapy and graded exercise.

## 2017-02-24 NOTE — Progress Notes (Signed)
    Subjective: CC: ?chronic fatigue syndrome HPI: Gail Blackburn is a 41 y.o. female presenting to clinic today for:  Patient reports that she saw her therapist and told she might have chronic fatigue syndrome.  She follows up today to discuss this.  Patient reports about a 3 year history of fatigue, of note she was diagnosed with depression about 5 years ago.  She reports that she has poor motivation, easily fatigues after about 5-10 minutes of structured physical activity.  Her thinking is impacted regularly by fatigue.  She reports sleep that is not refreshing.  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  She notes that her depression and fatigue impact her relationship with her husband.  She denies SOB, CP, orthopnea.  Endorses occ dizziness w/ positional changes.  She reports chronic history of migraine headaches.  She notes that often left retroorbital area is most affected.  She notes nausea and photophobia with headaches.  She also reports diplopia during episodes.  She notes she used to see a headache specialist, who did injections for migraines.  She has used TCA and opioid medications in the past with little improvement.  She is not aware of ever having used Topamax or Propranolol for prx.  She would like new referral to neurology or headache specialist for this.  Social Hx reviewed. MedHx, medications and allergies reviewed.  Please see EMR. Health Maintenance: pap w/ Gyn ROS: Per HPI  Objective: Office vital signs reviewed. BP 98/74   Pulse 94   Temp 98.8 F (37.1 C) (Oral)   Ht 5\' 5"  (1.651 m)   Wt 138 lb 9.6 oz (62.9 kg)   LMP 02/10/2017 (Exact Date)   SpO2 97%   BMI 23.06 kg/m   Physical Examination:  General: Awake, alert, thin, well appearing female, No acute distress Cardio: regular rate and  rhythm, S1S2 heard, no murmurs appreciated Pulm: clear to auscultation bilaterally, no wheezes, rhonchi or rales; normal work of breathing on room air Neuro: PERRL, EOMI, CN 2-12 grossly in tact, no focal neurologic deficits. Psych: mood stable, speech normal, good eye contact, good insight  Assessment/ Plan: 41 y.o. female   Chronic fatigue syndrome with fibromyalgia She fits the criteria for this controversial diagnosis quite well.  However, will r/o other reasons for fatigue. Will check B12, Vit D level today.  TSH obtained in 08/2016 was normal.  She is also on several sedating medications which may explain symptoms.  Symptoms certainly may be related to her depression for which she is seen by a therapist and a psychiatrist.  Current recommendations for treatment for this syndrome is Cognitive behavioral therapy and graded exercise, both of which patient is already doing.  I encouraged her to continue following closely with her therapist.  Migraine Seemingly refractory to several medications in the past.  Will refer to Neurology for further assessment/ management.  Follow up prn.  Raliegh IpAshly M Anyi Fels, DO PGY-3, [facility]

## 2017-02-24 NOTE — Assessment & Plan Note (Addendum)
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  However, will r/o other reasons for fatigue. Will check B12, Vit D level today.  TSH obtained in 08/2016 was normal.  She is also on several sedating medications which may explain symptoms.  Symptoms certainly may be related to her depression for which she is seen by a therapist and a psychiatrist.  Current recommendations for treatment for this syndrome is Cognitive behavioral therapy and graded exercise, both of which patient is already doing.  I encouraged her to continue following closely with her therapist.

## 2017-02-25 ENCOUNTER — Encounter: Payer: Self-pay | Admitting: Family Medicine

## 2017-02-25 LAB — VITAMIN B12: Vitamin B-12: 542 pg/mL (ref 232–1245)

## 2017-02-25 LAB — VITAMIN D 25 HYDROXY (VIT D DEFICIENCY, FRACTURES): Vit D, 25-Hydroxy: 41.6 ng/mL (ref 30.0–100.0)

## 2017-03-25 ENCOUNTER — Other Ambulatory Visit: Payer: Self-pay | Admitting: Family Medicine

## 2017-04-24 ENCOUNTER — Encounter (INDEPENDENT_AMBULATORY_CARE_PROVIDER_SITE_OTHER): Payer: Self-pay

## 2017-04-24 ENCOUNTER — Encounter: Payer: Self-pay | Admitting: Neurology

## 2017-04-24 ENCOUNTER — Ambulatory Visit (INDEPENDENT_AMBULATORY_CARE_PROVIDER_SITE_OTHER): Payer: Medicaid Other | Admitting: Neurology

## 2017-04-24 VITALS — BP 113/69 | HR 86 | Ht 65.5 in | Wt 139.2 lb

## 2017-04-24 DIAGNOSIS — R519 Headache, unspecified: Secondary | ICD-10-CM

## 2017-04-24 DIAGNOSIS — G43711 Chronic migraine without aura, intractable, with status migrainosus: Secondary | ICD-10-CM

## 2017-04-24 DIAGNOSIS — R51 Headache with orthostatic component, not elsewhere classified: Secondary | ICD-10-CM

## 2017-04-24 DIAGNOSIS — R42 Dizziness and giddiness: Secondary | ICD-10-CM

## 2017-04-24 DIAGNOSIS — H539 Unspecified visual disturbance: Secondary | ICD-10-CM

## 2017-04-24 MED ORDER — PROCHLORPERAZINE MALEATE 5 MG PO TABS: ORAL_TABLET | ORAL | 1 refills | Status: DC

## 2017-04-24 MED ORDER — PROPRANOLOL HCL 10 MG PO TABS: 10.0000 mg | ORAL_TABLET | Freq: Two times a day (BID) | ORAL | 6 refills | Status: DC

## 2017-04-24 MED ORDER — METHYLPREDNISOLONE 4 MG PO TBPK: ORAL_TABLET | ORAL | 1 refills | Status: DC

## 2017-04-24 NOTE — Patient Instructions (Addendum)
Remember to drink plenty of fluid, eat healthy meals and do not skip any meals. Try to eat protein with a every meal and eat a healthy snack such as fruit or nuts in between meals. Try to keep a regular sleep-wake schedule and try to exercise daily, particularly in the form of walking, 20-30 minutes a day, if you can.   As far as your medications are concerned, I would like to suggest:  Propranolol 10mg  twice a day Steroid (Medrol) dosepak Compazine up to three times a day Botox for migraines  Migraine Buddy  As far as diagnostic testing: MRI brain  I would like to see you back in botox for migraine, sooner if we need to. Please call us with any interim questions, concerns, problems, updates or refill requests.   Our phone number is [phone]. We also have an after hours call service for urgent matters and there is a physician on-call for urgent questions. For any emergencies you know to call 911 or go to the nearest emergency room  Methylprednisolone tablets What is this medicine? METHYLPREDNISOLONE (meth ill pred NISS oh lone) is a corticosteroid. It is commonly used to treat inflammation of the skin, joints, lungs, and other organs. Common conditions treated include asthma, allergies, and arthritis. It is also used for other conditions, such as blood disorders and diseases of the adrenal glands. This medicine may be used for other purposes; ask your health care provider or pharmacist if you have questions. COMMON BRAND NAME(S): Medrol, Medrol Dosepak What should I tell my health care provider before I take this medicine? They need to know if you have any of these conditions: -Cushing's syndrome -eye disease, vision problems -diabetes -glaucoma -heart disease -high blood pressure -infection (especially a virus infection such as chickenpox, cold sores, or herpes) -liver disease -mental illness -myasthenia gravis -osteoporosis -recently received or scheduled to receive a  vaccine -seizures -stomach or intestine problems -thyroid disease -an unusual or allergic reaction to lactose, methylprednisolone, other medicines, foods, dyes, or preservatives -pregnant or trying to get pregnant -breast-feeding How should I use this medicine? Take this medicine by mouth with a glass of water. Follow the directions on the prescription label. Take this medicine with food. If you are taking this medicine once a day, take it in the morning. Do not take it more often than directed. Do not suddenly stop taking your medicine because you may develop a severe reaction. Your doctor will tell you how much medicine to take. If your doctor wants you to stop the medicine, the dose may be slowly lowered over time to avoid any side effects. Talk to your pediatrician regarding the use of this medicine in children. Special care may be needed. Overdosage: If you think you have taken too much of this medicine contact a poison control center or emergency room at once. NOTE: This medicine is only for you. Do not share this medicine with others. What if I miss a dose? If you miss a dose, take it as soon as you can. If it is almost time for your next dose, talk to your doctor or health care professional. You may need to miss a dose or take an extra dose. Do not take double or extra doses without advice. What may interact with this medicine? Do not take this medicine with any of the following medications: -alefacept -echinacea -live virus vaccines -metyrapone -mifepristone This medicine may also interact with the following medications: -amphotericin B -aspirin and aspirin-like medicines -certain antibiotics like erythromycin,  clarithromycin, troleandomycin -certain medicines for diabetes -certain medicines for fungal infections like ketoconazole -certain medicines for seizures like carbamazepine, phenobarbital, phenytoin -certain medicines that treat or prevent blood clots like  warfarin -cholestyramine -cyclosporine -digoxin -diuretics -female hormones, like estrogens and birth control pills -isoniazid -NSAIDs, medicines for pain inflammation, like ibuprofen or naproxen -other medicines for myasthenia gravis -rifampin -vaccines This list may not describe all possible interactions. Give your health care provider a list of all the medicines, herbs, non-prescription drugs, or dietary supplements you use. Also tell them if you smoke, drink alcohol, or use illegal drugs. Some items may interact with your medicine. What should I watch for while using this medicine? Tell your doctor or healthcare professional if your symptoms do not start to get better or if they get worse. Do not stop taking except on your doctor's advice. You may develop a severe reaction. Your doctor will tell you how much medicine to take. This medicine may increase your risk of getting an infection. Tell your doctor or health care professional if you are around anyone with measles or chickenpox, or if you develop sores or blisters that do not heal properly. This medicine may affect blood sugar levels. If you have diabetes, check with your doctor or health care professional before you change your diet or the dose of your diabetic medicine. Tell your doctor or health care professional right away if you have any change in your eyesight. Using this medicine for a long time may increase your risk of low bone mass. Talk to your doctor about bone health. What side effects may I notice from receiving this medicine? Side effects that you should report to your doctor or health care professional as soon as possible: -allergic reactions like skin rash, itching or hives, swelling of the face, lips, or tongue -bloody or tarry stools -changes in vision -hallucination, loss of contact with reality -muscle cramps -muscle pain -palpitations -signs and symptoms of high blood sugar such as dizziness; dry mouth; dry  skin; fruity breath; nausea; stomach pain; increased hunger or thirst; increased urination -signs and symptoms of infection like fever or chills; cough; sore throat; pain or trouble passing urine -trouble passing urine or change in the amount of urine Side effects that usually do not require medical attention (report to your doctor or health care professional if they continue or are bothersome): -changes in emotions or mood -constipation -diarrhea -excessive hair growth on the face or body -headache -nausea, vomiting -trouble sleeping -weight gain This list may not describe all possible side effects. Call your doctor for medical advice about side effects. You may report side effects to FDA at 1-800-FDA-1088. Where should I keep my medicine? Keep out of the reach of children. Store at room temperature between 20 and 25 degrees C (68 and 77 degrees F). Throw away any unused medicine after the expiration date. NOTE: This sheet is a summary. It may not cover all possible information. If you have questions about this medicine, talk to your doctor, pharmacist, or health care provider.  2018 [employer] Standard (2015-11-19 15:53:30)   Prochlorperazine tablets What is this medicine? PROCHLORPERAZINE (proe klor PER a zeen) helps to control severe nausea and vomiting. This medicine is also used to treat schizophrenia. It can also help patients who experience anxiety that is not due to psychological illness. This medicine may be used for other purposes; ask your health care provider or pharmacist if you have questions. COMMON BRAND NAME(S): Compazine What should I tell my health care  provider before I take this medicine? They need to know if you have any of these conditions: -blood disorders or disease -dementia -liver disease or jaundice -Parkinson's disease -uncontrollable movement disorder -an unusual or allergic reaction to prochlorperazine, other medicines, foods, dyes, or  preservatives -pregnant or trying to get pregnant -breast-feeding How should I use this medicine? Take this medicine by mouth with a glass of water. Follow the directions on the prescription label. Take your doses at regular intervals. Do not take your medicine more often than directed. Do not stop taking this medicine suddenly. This can cause nausea, vomiting, and dizziness. Ask your doctor or health care professional for advice. Talk to your pediatrician regarding the use of this medicine in children. Special care may be needed. While this drug may be prescribed for children as young as 2 years for selected conditions, precautions do apply. Overdosage: If you think you have taken too much of this medicine contact a poison control center or emergency room at once. NOTE: This medicine is only for you. Do not share this medicine with others. What if I miss a dose? If you miss a dose, take it as soon as you can. If it is almost time for your next dose, take only that dose. Do not take double or extra doses. What may interact with this medicine? Do not take this medicine with any of the following medications: -amoxapine -antidepressants like citalopram, escitalopram, fluoxetine, paroxetine, and sertraline -deferoxamine -dofetilide -maprotiline -tricyclic antidepressants like amitriptyline, clomipramine, imipramine, nortiptyline and others This medicine may also interact with the following medications: -lithium -medicines for pain -phenytoin -propranolol -warfarin This list may not describe all possible interactions. Give your health care provider a list of all the medicines, herbs, non-prescription drugs, or dietary supplements you use. Also tell them if you smoke, drink alcohol, or use illegal drugs. Some items may interact with your medicine. What should I watch for while using this medicine? Visit your doctor or health care professional for regular checks on your progress. You may get  drowsy or dizzy. Do not drive, use machinery, or do anything that needs mental alertness until you know how this medicine affects you. Do not stand or sit up quickly, especially if you are an older patient. This reduces the risk of dizzy or fainting spells. Alcohol may interfere with the effect of this medicine. Avoid alcoholic drinks. This medicine can reduce the response of your body to heat or cold. Dress warm in cold weather and stay hydrated in hot weather. If possible, avoid extreme temperatures like saunas, hot tubs, very hot or cold showers, or activities that can cause dehydration such as vigorous exercise. This medicine can make you more sensitive to the sun. Keep out of the sun. If you cannot avoid being in the sun, wear protective clothing and use sunscreen. Do not use sun lamps or tanning beds/booths. Your mouth may get dry. Chewing sugarless gum or sucking hard candy, and drinking plenty of water may help. Contact your doctor if the problem does not go away or is severe. What side effects may I notice from receiving this medicine? Side effects that you should report to your doctor or health care professional as soon as possible: -blurred vision -breast enlargement in men or women -breast milk in women who are not breast-feeding -chest pain, fast or irregular heartbeat -confusion, restlessness -dark yellow or brown urine -difficulty breathing or swallowing -dizziness or fainting spells -drooling, shaking, movement difficulty (shuffling walk) or rigidity -fever, chills, sore throat -  involuntary or uncontrollable movements of the eyes, mouth, head, arms, and legs -seizures -stomach area pain -unusually weak or tired -unusual bleeding or bruising -yellowing of skin or eyes Side effects that usually do not require medical attention (report to your doctor or health care professional if they continue or are bothersome): -difficulty passing urine -difficulty sleeping -headache -sexual  dysfunction -skin rash, or itching This list may not describe all possible side effects. Call your doctor for medical advice about side effects. You may report side effects to FDA at 1-800-FDA-1088. Where should I keep my medicine? Keep out of the reach of children. Store at room temperature between 15 and 30 degrees C (59 and 86 degrees F). Protect from light. Throw away any unused medicine after the expiration date. NOTE: This sheet is a summary. It may not cover all possible information. If you have questions about this medicine, talk to your doctor, pharmacist, or health care provider.  2018 [employer]/Gold Standard (2012-01-31 16:59:39)    Propranolol tablets What is this medicine? PROPRANOLOL (proe PRAN oh lole) is a beta-blocker. Beta-blockers reduce the workload on the heart and help it to beat more regularly. This medicine is used to treat high blood pressure, to control irregular heart rhythms (arrhythmias) and to relieve chest pain caused by angina. It may also be helpful after a heart attack. This medicine is also used to prevent migraine headaches, relieve uncontrollable shaking (tremors), and help certain problems related to the thyroid gland and adrenal gland. This medicine may be used for other purposes; ask your health care provider or pharmacist if you have questions. COMMON BRAND NAME(S): Inderal What should I tell my health care provider before I take this medicine? They need to know if you have any of these conditions: -circulation problems or blood vessel disease -diabetes -history of heart attack or heart disease, vasospastic angina -kidney disease -liver disease -lung or breathing disease, like asthma or emphysema -pheochromocytoma -slow heart rate -thyroid disease -an unusual or allergic reaction to propranolol, other beta-blockers, medicines, foods, dyes, or preservatives -pregnant or trying to get pregnant -breast-feeding How should I use this medicine? Take  this medicine by mouth with a glass of water. Follow the directions on the prescription label. Take your doses at regular intervals. Do not take your medicine more often than directed. Do not stop taking except on your the advice of your doctor or health care professional. Talk to your pediatrician regarding the use of this medicine in children. Special care may be needed. Overdosage: If you think you have taken too much of this medicine contact a poison control center or emergency room at once. NOTE: This medicine is only for you. Do not share this medicine with others. What if I miss a dose? If you miss a dose, take it as soon as you can. If it is almost time for your next dose, take only that dose. Do not take double or extra doses. What may interact with this medicine? Do not take this medicine with any of the following medications: -feverfew -phenothiazines like chlorpromazine, mesoridazine, prochlorperazine, thioridazine This medicine may also interact with the following medications: -aluminum hydroxide gel -antipyrine -antiviral medicines for HIV or AIDS -barbiturates like phenobarbital -certain medicines for blood pressure, heart disease, irregular heart beat -cimetidine -ciprofloxacin -diazepam -fluconazole -haloperidol -isoniazid -medicines for cholesterol like cholestyramine or colestipol -medicines for mental depression -medicines for migraine headache like almotriptan, eletriptan, frovatriptan, naratriptan, rizatriptan, sumatriptan, zolmitriptan -NSAIDs, medicines for pain and inflammation, like ibuprofen or naproxen -phenytoin -rifampin -  teniposide -theophylline -thyroid medicines -tolbutamide -warfarin -zileuton This list may not describe all possible interactions. Give your health care provider a list of all the medicines, herbs, non-prescription drugs, or dietary supplements you use. Also tell them if you smoke, drink alcohol, or use illegal drugs. Some items may  interact with your medicine. What should I watch for while using this medicine? Visit your doctor or health care professional for regular check ups. Check your blood pressure and pulse rate regularly. Ask your health care professional what your blood pressure and pulse rate should be, and when you should contact them. You may get drowsy or dizzy. Do not drive, use machinery, or do anything that needs mental alertness until you know how this drug affects you. Do not stand or sit up quickly, especially if you are an older patient. This reduces the risk of dizzy or fainting spells. Alcohol can make you more drowsy and dizzy. Avoid alcoholic drinks. This medicine can affect blood sugar levels. If you have diabetes, check with your doctor or health care professional before you change your diet or the dose of your diabetic medicine. Do not treat yourself for coughs, colds, or pain while you are taking this medicine without asking your doctor or health care professional for advice. Some ingredients may increase your blood pressure. What side effects may I notice from receiving this medicine? Side effects that you should report to your doctor or health care professional as soon as possible: -allergic reactions like skin rash, itching or hives, swelling of the face, lips, or tongue -breathing problems -changes in blood sugar -cold hands or feet -difficulty sleeping, nightmares -dry peeling skin -hallucinations -muscle cramps or weakness -slow heart rate -swelling of the legs and ankles -vomiting Side effects that usually do not require medical attention (report to your doctor or health care professional if they continue or are bothersome): -change in sex drive or performance -diarrhea -dry sore eyes -hair loss -nausea -weak or tired This list may not describe all possible side effects. Call your doctor for medical advice about side effects. You may report side effects to FDA at  1-800-FDA-1088. Where should I keep my medicine? Keep out of the reach of children. Store at room temperature between 15 and 30 degrees C (59 and 86 degrees F). Protect from light. Throw away any unused medicine after the expiration date. NOTE: This sheet is a summary. It may not cover all possible information. If you have questions about this medicine, talk to your doctor, pharmacist, or health care provider.  2018 [employer] Standard (2013-05-17 14:51:53)

## 2017-04-24 NOTE — Progress Notes (Addendum)
[facility]    Provider:  Dr Lucia GaskinsAhern Referring Provider: Renne MuscaWarden, Daniel L, MD Primary Care Physician:  Raliegh IpGottschalk, Ashly M, DO  CC:  Migraines  HPI:  Gail Blackburn is a 41 y.o. female here as a referral from Dr. Myrtie SomanWarden for migraines. PMHx migraines, anxiety, depression. Started when she was [age]. Had them off and on for years. Has daily headaches for the last 10 years. Over 15 a month are migrainous. Can last all day long up to 24 hours or sometimes days. Can be severe 10/10 pain. They start behind the left eye and spread to the back of the head, pounding, throbbing, pulsating, with superimposed sharp pains, severe light sensitivity has to go into a dark room, she has nausea. No vomiting. No aura. Staring at the screen makes it worse. She has a lot of blurred vision and eye pain. Headaches worsening for the last 3 months. Worse with bending over, positional. She has a lot of neck pain. Had nerve blocks in the past and went to the headache [facility] center. Nerve blocks did not help.Headaches are worse bending over, vision changes. No medication overuse. No aura. No other focal neurologic deficits, associated symptoms, inciting events or modifiable factors.  Tried: Nortriptyline, Amitriptyline, cymbalta, celebrex, gabapentin,   Reviewed notes, labs and imaging from outside physicians, which showed:   B12, TSH nml. BMP and CBC  Essentially normal.   Xr cervical spine and thoracic spine: reviewed images, normal  Review of Systems: Patient complains of symptoms per HPI as well as the following symptoms: memory loss, headache, weakness, dizziness, sleepiness, depression, anxiety, not enbough sleep, decreased . Pertinent negatives and positives per HPI. All others negative.   Social History   Social History  . Marital status: Significant Other    Spouse name: N/A  . Number of children: 2  . Years of education: College   Occupational History  . Unemployed    Social History  Main Topics  . Smoking status: Current Every Day Smoker    Years: 15.00    Types: Cigarettes  . Smokeless tobacco: Never Used     Comment: Up to 5 vape cigs per day  . Alcohol use No     Comment: Occasional, maybe 2 x/yr  . 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 Sexual activity: Yes    Birth control/ protection: Injection   Other Topics Concern  . Not on file   Social History Narrative   Lives at home w/ her children   Right-handed   Caffeine: 1-2 cups per day    Family History  Problem Relation Age of Onset  . Bipolar disorder Mother   . Alcohol abuse Mother   . Hyperlipidemia Mother   . Heart disease Maternal Grandmother   . Stroke Maternal Grandmother   . Leukemia Maternal Grandmother   . Hyperlipidemia Maternal Grandmother   . Hypertension Maternal Grandmother   . Graves' disease Cousin   . Alcohol abuse Maternal Aunt   . Drug abuse Maternal Aunt   . Asthma Maternal Aunt   . COPD Maternal Aunt   . Bipolar disorder Maternal Aunt   . Hyperlipidemia Maternal Aunt     Past Medical History:  Diagnosis Date  . Anemia    PRIOR HISTORY  . Anxiety   . Asthma   . Bipolar disorder (HCC)    Borderline  . Depression   . Fibromyalgia   . Headache    MIGRAINES  .  Hypothyroidism 2009   TOOK MEDS FOR FEW WEEKS NO MEDS NOW  . Migraine   . Thyroid disease    treated in past not sure if too high or too low    Past Surgical History:  Procedure Laterality Date  . COLONOSCOPY    . DILATION AND EVACUATION N/A 05/06/2016   Procedure: DILATATION AND EVACUATION;  Surgeon: Huel CoteKathy Richardson, MD;  Location: [facility];  Service: Gynecology;  Laterality: N/A;  . FOOT SURGERY    . TEETH PULLED  03/2016  . WISDOM TOOTH EXTRACTION      Current Outpatient Prescriptions  Medication Sig Dispense Refill  . ALPRAZolam (XANAX) 1 MG tablet Take 0.5-1 mg by mouth 2 (two) times daily as needed.    . buprenorphine-naloxone (SUBOXONE) 8-2 MG SUBL SL tablet Place 1  tablet under the tongue 2 (two) times daily.    . celecoxib (CELEBREX) 100 MG capsule TAKE 1 CAPSULE BY MOUTH TWICE DAILY 60 capsule 0  . doxepin (SINEQUAN) 50 MG capsule Take 50 mg by mouth at bedtime.    . DULoxetine (CYMBALTA) 30 MG capsule Take 1 capsule by mouth 2 (two) times daily.    Marland Kitchen. gabapentin (NEURONTIN) 800 MG tablet Take 800 mg by mouth 3 (three) times daily.  0  . lamoTRIgine (LAMICTAL) 150 MG tablet Take 150 mg by mouth daily.    . Lurasidone HCl (LATUDA PO) Take 1 tablet by mouth daily.    . norethindrone (CAMILA) 0.35 MG tablet Take 1 tablet by mouth daily.    . methylPREDNISolone (MEDROL DOSEPAK) 4 MG TBPK tablet follow package directions 21 tablet 1  . prochlorperazine (COMPAZINE) 5 MG tablet Take 1 tablet (5 mg total) by mouth every 6 (six) hours as needed for nausea or migraine/headache 60 tablet 1  . propranolol (INDERAL) 10 MG tablet Take 1 tablet (10 mg total) by mouth 2 (two) times daily. 60 tablet 6   No current facility-administered medications for this visit.     Allergies as of 04/24/2017 - Review Complete 04/24/2017  Allergen Reaction Noted  . Latex Rash 05/06/2016    Vitals: BP 113/69   Pulse 86   Ht 5' 5.5" (1.664 m)   Wt 139 lb 3.2 oz (63.1 kg)   BMI 22.81 kg/m  Last Weight:  Wt Readings from Last 1 Encounters:  04/24/17 139 lb 3.2 oz (63.1 kg)   Last Height:   Ht Readings from Last 1 Encounters:  04/24/17 5' 5.5" (1.664 m)    Physical exam: Exam: Gen: NAD, conversant, well nourised, well groomed                     CV: RRR, no MRG. No Carotid Bruits. No peripheral edema, warm, nontender Eyes: Conjunctivae clear without exudates or hemorrhage  Neuro: Detailed Neurologic Exam  Speech:    Speech is normal; fluent and spontaneous with normal comprehension.  Cognition:    The patient is oriented to person, place, and time;     recent and remote memory intact;     language fluent;     normal attention, concentration,     fund of  knowledge Cranial Nerves:    The pupils are equal, round, and reactive to light. The fundi are normal and spontaneous venous pulsations are present. Visual fields are full to finger confrontation. Impaired upgaze. Trigeminal sensation is intact and the muscles of mastication are normal. The face is symmetric. The palate elevates in the midline. Hearing intact. Voice is normal. Shoulder shrug  is normal. The tongue has normal motion without fasciculations.   Coordination:    Normal finger to nose and heel to shin. Normal rapid alternating movements.   Gait:    Heel-toe and tandem gait are normal.   Motor Observation:    No asymmetry, no atrophy, and no involuntary movements noted. Tone:    Normal muscle tone.    Posture:    Posture is normal. normal erect    Strength:    Strength is V/V in the upper and lower limbs.      Sensation: intact to LT     Reflex Exam:  DTR's:    Deep tendon reflexes in the upper and lower extremities are normal bilaterally.   Toes:    The toes are downgoing bilaterally.   Clonus:    Clonus is absent.      Assessment/Plan:   41 year old with chronic migraines, intractable, without aura, with status migrainosus  MRi brain w/wo ocntrast due to positional headache, dizziness, worsening headache, vision changes: open MRI to evaluate for space-occupying masses, IIH or other intracerebral etiologies Propranol 10mg  twice a day, watch for low BP or pulse Medrol dosepak  Compazine as needed for headache Botox for migraines: recommend due to intractable headaches having failed multiple medications and see multiple neurologists and the [facility] wellness center  Discussed: To prevent or relieve headaches, try the following: Cool Compress. Lie down and place a cool compress on your head.  Avoid headache triggers. If certain foods or odors seem to have triggered your migraines in the past, avoid them. A headache diary might help you identify triggers.  Include  physical activity in your daily routine. Try a daily walk or other moderate aerobic exercise.  Manage stress. Find healthy ways to cope with the stressors, such as delegating tasks on your to-do list.  Practice relaxation techniques. Try deep breathing, yoga, massage and visualization.  Eat regularly. Eating regularly scheduled meals and maintaining a healthy diet might help prevent headaches. Also, drink plenty of fluids.  Follow a regular sleep schedule. Sleep deprivation might contribute to headaches Consider biofeedback. With this mind-body technique, you learn to control certain bodily functions - such as muscle tension, heart rate and blood pressure - to prevent headaches or reduce headache pain.    Proceed to emergency room if you experience new or worsening symptoms or symptoms do not resolve, if you have new neurologic symptoms or if headache is severe, or for any concerning symptom.   Provided education and documentation from American headache Society toolbox including articles on: chronic migraine medication overuse headache, chronic migraines, prevention of migraines, behavioral and other nonpharmacologic treatments for headache.  No orders of the defined types were placed in this encounter.   Naomie Dean, MD  [facility] [address] [place], [place] [address]  Phone [phone] Fax [phone]

## 2017-04-28 ENCOUNTER — Telehealth: Payer: Self-pay | Admitting: Neurology

## 2017-04-28 NOTE — Telephone Encounter (Signed)
Pt called the office she reached out to GI to schedule an MRI but they do not have an order. Please call

## 2017-04-28 NOTE — Addendum Note (Signed)
Addended by: Naomie DeanAHERN, Asa Baudoin B on: 04/28/2017 03:35 PM   Modules accepted: Orders

## 2017-04-28 NOTE — Telephone Encounter (Signed)
MRI is ordered, I'll ask Irving Burtonmily or Annabelle HarmanDana to call Monday if they can. thanks

## 2017-05-01 NOTE — Telephone Encounter (Signed)
Noted, [facility] and they will contact patient is scheduled.

## 2017-05-10 ENCOUNTER — Telehealth: Payer: Self-pay | Admitting: Neurology

## 2017-05-10 DIAGNOSIS — G43711 Chronic migraine without aura, intractable, with status migrainosus: Secondary | ICD-10-CM

## 2017-05-10 MED ORDER — PROPRANOLOL HCL 10 MG PO TABS: 10.0000 mg | ORAL_TABLET | Freq: Three times a day (TID) | ORAL | 5 refills | Status: DC

## 2017-05-10 NOTE — Telephone Encounter (Signed)
New pt of Dr. Trevor MaceAhern's seen 04/24/17 for migraines. BP 113/69, HR 86 at that time. She was given medrol dosepak, started on propranolol 10 mg BID and Compazine prn. She has tried and failed nortriptyline, amitriptyline, Cymbalta, Celebrex, gabapentin and nerve blocks in the past. MRI of the brain has been ordered and pt is scheduled for Botox on 05/23/17.

## 2017-05-10 NOTE — Addendum Note (Signed)
Addended by: Huston FoleyATHAR, Larkin Alfred on: 05/10/2017 04:38 PM   Modules accepted: Orders

## 2017-05-10 NOTE — Telephone Encounter (Signed)
Pt calling to inform that the propranolol (INDERAL) 10 MG tablet is not working for her and she would like to know if the dosage could be increased.  Pt is asking for a call back

## 2017-05-10 NOTE — Telephone Encounter (Signed)
Please advise patient to increase her propranolol to 10 mg 3 times a day. I adjusted her prescription in that regard. She may need to keep an eye on her blood pressure as beta blocker type medications can lower blood pressure and pulse.

## 2017-05-11 NOTE — Telephone Encounter (Signed)
Spoke with patient and advised her, per Dr Frances FurbishAthar to increase propanolol to 10 mg three times a day. Informed her [facility] Advised her to keep a check on her BP as the medication can lower BP and heart rate. . She stated she has a BP cuff, and she agreed to check her BP. Advised she call for any further questions or concerns. She verbalized understanding, appreciation.

## 2017-05-23 ENCOUNTER — Ambulatory Visit (INDEPENDENT_AMBULATORY_CARE_PROVIDER_SITE_OTHER): Payer: Medicaid Other | Admitting: Neurology

## 2017-05-23 VITALS — BP 93/62 | HR 81

## 2017-05-23 DIAGNOSIS — G43711 Chronic migraine without aura, intractable, with status migrainosus: Secondary | ICD-10-CM

## 2017-05-23 MED ORDER — PROPRANOLOL HCL ER 60 MG PO CP24: 60.0000 mg | ORAL_CAPSULE | Freq: Every day | ORAL | 11 refills | Status: DC

## 2017-05-23 NOTE — Progress Notes (Signed)

## 2017-06-02 ENCOUNTER — Ambulatory Visit
Admission: RE | Admit: 2017-06-02 | Discharge: 2017-06-02 | Disposition: A | Discharge status: Home / Self Care | Payer: Medicaid Other | Source: Ambulatory Visit | Attending: Neurology | Admitting: Neurology

## 2017-06-02 DIAGNOSIS — R42 Dizziness and giddiness: Secondary | ICD-10-CM

## 2017-06-02 DIAGNOSIS — R51 Headache with orthostatic component, not elsewhere classified: Secondary | ICD-10-CM

## 2017-06-02 DIAGNOSIS — H539 Unspecified visual disturbance: Secondary | ICD-10-CM

## 2017-06-02 DIAGNOSIS — G43711 Chronic migraine without aura, intractable, with status migrainosus: Secondary | ICD-10-CM

## 2017-06-02 DIAGNOSIS — R519 Headache, unspecified: Secondary | ICD-10-CM

## 2017-06-02 MED ORDER — GADOBENATE DIMEGLUMINE 529 MG/ML IV SOLN
15.0000 mL | Freq: Once | INTRAVENOUS | Status: AC | PRN
  Administered 2017-06-02: 13 mL via INTRAVENOUS

## 2017-06-08 ENCOUNTER — Telehealth: Payer: Self-pay | Admitting: *Deleted

## 2017-06-08 NOTE — Telephone Encounter (Signed)
Spoke to pt and relayed that MRI Brain results unremarkable.  She verbalized understanding.

## 2017-06-08 NOTE — Telephone Encounter (Signed)
-----   Message from Anson FretAntonia B Ahern, MD sent at 06/05/2017  5:33 PM EDT ----- MRI brain unremarkable thanks

## 2017-07-04 ENCOUNTER — Telehealth: Payer: Self-pay | Admitting: Neurology

## 2017-07-04 NOTE — Telephone Encounter (Signed)
Pt calling to inform that prochlorperazine (COMPAZINE) 5 MG tablet is not strong enough, pt states it does nothing for her please call

## 2017-07-05 MED ORDER — RIZATRIPTAN BENZOATE 5 MG PO TBDP: 5.0000 mg | ORAL_TABLET | ORAL | 0 refills | Status: DC | PRN

## 2017-07-05 MED ORDER — ONDANSETRON HCL 4 MG PO TABS: 4.0000 mg | ORAL_TABLET | Freq: Four times a day (QID) | ORAL | 0 refills | Status: DC | PRN

## 2017-07-05 NOTE — Telephone Encounter (Signed)
Called patient. She is reporting nausea not relieved by Compazine and is currently denying a h/a but states she has still had Migraines (about 4 days/week) despite prescribed increase in Propranolol. Pt aware Dr. Lucia Gaskins out of office. Will d/w work-in MD.

## 2017-07-05 NOTE — Telephone Encounter (Signed)
Spoke with Dr. Terrace Arabia regarding pt's persistant nausea and feeling as though a migraine is going to start. Per VO, she will prescribe Rizatriptan (Maxalt) 5 mg PO and and Ondansetron (Zofran) 4 mg PO.   Patient called and agreed to above plan. She verbalized understanding of instructions to take Maxalt 1 tab @ onset of Migraine, may repeat in 2 hrs with no more than 2 tabs in 24 hr period. She will f/u if Maxalt not working. Prescriptions to be sent to [facility] on [address]

## 2017-07-05 NOTE — Addendum Note (Signed)
Addended by: Bertram Savin on: 07/05/2017 10:21 AM   Modules accepted: Orders

## 2017-08-11 ENCOUNTER — Other Ambulatory Visit: Payer: Self-pay | Admitting: Neurology

## 2017-08-15 ENCOUNTER — Other Ambulatory Visit: Payer: Self-pay | Admitting: *Deleted

## 2017-08-15 MED ORDER — RIZATRIPTAN BENZOATE 5 MG PO TBDP: 5.0000 mg | ORAL_TABLET | ORAL | 0 refills | Status: DC | PRN

## 2017-08-29 ENCOUNTER — Telehealth: Payer: Self-pay | Admitting: *Deleted

## 2017-08-29 MED ORDER — ONDANSETRON HCL 4 MG PO TABS: 4.0000 mg | ORAL_TABLET | Freq: Four times a day (QID) | ORAL | 10 refills | Status: DC | PRN

## 2017-08-29 NOTE — Telephone Encounter (Signed)
Per Dr. Lucia GaskinsAhern Zofran refill Zofran 20 tablets 10 refills.

## 2017-09-04 ENCOUNTER — Telehealth: Payer: Self-pay | Admitting: *Deleted

## 2017-09-04 NOTE — Telephone Encounter (Signed)
Called the patient and LVM (ok per DPR) informing her that the office will be closed tomorrow 12/11 due to the snow. We will call back ASAP to reschedule her appointment. Left office number for her to call back, likely on Wednesday, if she has any questions.

## 2017-09-05 ENCOUNTER — Ambulatory Visit: Payer: Self-pay | Admitting: Neurology

## 2017-09-08 NOTE — Telephone Encounter (Signed)
Called to r/s patient's botox appt. She stated she had already been scheduled for 09/15/17.

## 2017-09-15 ENCOUNTER — Encounter: Payer: Self-pay | Admitting: Neurology

## 2017-09-15 ENCOUNTER — Ambulatory Visit: Payer: Medicaid Other | Admitting: Neurology

## 2017-09-15 ENCOUNTER — Telehealth: Payer: Self-pay | Admitting: Neurology

## 2017-09-15 VITALS — BP 107/64 | HR 94

## 2017-09-15 DIAGNOSIS — G43711 Chronic migraine without aura, intractable, with status migrainosus: Secondary | ICD-10-CM | POA: Diagnosis not present

## 2017-09-15 NOTE — Telephone Encounter (Signed)
Please call pt to schedule BOTOX appt. Thank you. °

## 2017-09-15 NOTE — Progress Notes (Signed)
Botox-100 units x 2 vials Lot: [id number] Expiration: 02/2020 NDC: [id number]  Bacteriostatic 0.9% Sodium Chloride- 4mL total Lot: [id number] Expiration: 01/25/2019 NDC: 6213-0865-780409-4888-03  Dx: I69.629G43.711 SP //BCrn

## 2017-09-20 NOTE — Progress Notes (Signed)

## 2017-09-28 NOTE — Telephone Encounter (Signed)
I called to schedule the patient but she did not answer so I left a VM asking her to call me back.  

## 2017-10-08 ENCOUNTER — Other Ambulatory Visit: Payer: Self-pay | Admitting: Neurology

## 2017-11-02 ENCOUNTER — Encounter (HOSPITAL_COMMUNITY): Payer: Self-pay | Admitting: Family Medicine

## 2017-11-02 ENCOUNTER — Ambulatory Visit (HOSPITAL_COMMUNITY): Payer: Medicaid Other

## 2017-11-02 ENCOUNTER — Ambulatory Visit (HOSPITAL_COMMUNITY)
Admission: EM | Admit: 2017-11-02 | Discharge: 2017-11-02 | Disposition: A | Discharge status: Home / Self Care | Payer: Medicaid Other | Attending: Family Medicine | Admitting: Family Medicine

## 2017-11-02 ENCOUNTER — Ambulatory Visit (INDEPENDENT_AMBULATORY_CARE_PROVIDER_SITE_OTHER): Payer: Medicaid Other

## 2017-11-02 DIAGNOSIS — S20212A Contusion of left front wall of thorax, initial encounter: Secondary | ICD-10-CM

## 2017-11-02 DIAGNOSIS — R0782 Intercostal pain: Secondary | ICD-10-CM

## 2017-11-02 DIAGNOSIS — W108XXA Fall (on) (from) other stairs and steps, initial encounter: Secondary | ICD-10-CM

## 2017-11-02 MED ORDER — IBUPROFEN 800 MG PO TABS: 800.0000 mg | ORAL_TABLET | Freq: Three times a day (TID) | ORAL | 0 refills | Status: DC

## 2017-11-02 NOTE — ED Triage Notes (Signed)
Pt here for fall down her stairs today. sts she is hurting in her left rib area. Using lidocaine patch. Hurts to take a deep breath. sts 11 steps

## 2017-11-06 NOTE — ED Provider Notes (Signed)
[facility]   [id number] 11/02/17 Arrival Time: 1226  ASSESSMENT & PLAN:  1. Rib contusion, left, initial encounter     Imaging: Dg Ribs Unilateral W/chest Left  Result Date: 11/02/2017 CLINICAL DATA:  Status post fall with left rib pain. EXAM: LEFT RIBS AND CHEST - 3+ VIEW COMPARISON:  Thoracic spine series August 30, 2016 FINDINGS: No fracture or other bone lesions are seen involving the ribs. There is no evidence of pneumothorax or pleural effusion. Both lungs are clear. Heart size and mediastinal contours are within normal limits. IMPRESSION: Negative. Electronically Signed   By: Sherian ReinWei-Chen  Lin M.D.   On: 11/02/2017 15:01   Meds ordered this encounter  Medications  . ibuprofen (ADVIL,MOTRIN) 800 MG tablet    Sig: Take 1 tablet (800 mg total) by mouth 3 (three) times daily.    Dispense:  30 tablet    Refill:  0   Will f/u with PCP if not showing improvement over the next 1-2 weeks.  Reviewed expectations re: course of current medical issues. Questions answered. Outlined signs and symptoms indicating need for more acute intervention. Patient verbalized understanding. After Visit Summary given.  SUBJECTIVE: History from: patient. Gail Slimshley Mailloux is a 42 y.o. female who reports falling on her stairs and hitting her L ribs. Today. Immediate discomfort that is now persistent. Worsened by movement and deep breaths. No SOB. Lidocaine patch without much relief. Ambulatory without difficulty. No n/v. No h/o rib injury. No back pain. No extremity sensation changes or weakness.  ROS: As per HPI.   OBJECTIVE:  Vitals:   11/02/17 1343  BP: 98/75  Pulse: 83  Resp: 16  SpO2: 100%    General appearance: alert; no distress Extremities: no cyanosis or edema; symmetrical with no gross deformities CV: normal extremity capillary refill Lungs: CTAB Ribs: tender over left posterior lower ribs; no gross deformities Skin: warm and dry Neurologic: normal gait; normal symmetric  reflexes in all extremities; normal sensation in all extremities Psychological: alert and cooperative; normal mood and affect   Allergies  Allergen Reactions  . Latex Rash    Only in vaginal area    Past Medical History:  Diagnosis Date  . Anemia    PRIOR HISTORY  . Anxiety   . Asthma   . Bipolar disorder (HCC)    Borderline  . Depression   . Fibromyalgia   . Headache    MIGRAINES  . Hypothyroidism 2009   TOOK MEDS FOR FEW WEEKS NO MEDS NOW  . Migraine   . Thyroid disease    treated in past not sure if too high or too low   Social History   Socioeconomic History  . Marital status: Significant Other    Spouse name: Not on file  . Number of children: 2  . Years of education: College  . Highest education level: Not on file  Social Needs  . Financial resource strain: Not on file  . Food insecurity - worry: Not on file  . Food insecurity - inability: Not on file  . Transportation needs - medical: Not on file  . Transportation needs - non-medical: Not on file  Occupational History  . Occupation: Unemployed  Tobacco Use  . Smoking status: Current Every Day Smoker    Years: 15.00    Types: Cigarettes  . Smokeless tobacco: Never Used  . Tobacco comment: Up to 5 vape cigs per day  Substance and Sexual Activity  . Alcohol use: No    Comment: Occasional, maybe 2  x/yr  . 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 Sexual activity: Yes    Birth control/protection: Injection  Other Topics Concern  . Not on file  Social History Narrative   Lives at home w/ her children   Right-handed   Caffeine: 1-2 cups per day   Family History  Problem Relation Age of Onset  . Bipolar disorder Mother   . Alcohol abuse Mother   . Hyperlipidemia Mother   . Heart disease Maternal Grandmother   . Stroke Maternal Grandmother   . Leukemia Maternal Grandmother   . Hyperlipidemia Maternal Grandmother   . Hypertension Maternal Grandmother   . Graves' disease Cousin     . Alcohol abuse Maternal Aunt   . Drug abuse Maternal Aunt   . Asthma Maternal Aunt   . COPD Maternal Aunt   . Bipolar disorder Maternal Aunt   . Hyperlipidemia Maternal Aunt    Past Surgical History:  Procedure Laterality Date  . COLONOSCOPY    . DILATION AND EVACUATION N/A 05/06/2016   Procedure: DILATATION AND EVACUATION;  Surgeon: Huel Cote, MD;  Location: [facility];  Service: Gynecology;  Laterality: N/A;  . FOOT SURGERY    . TEETH PULLED  03/2016  . WISDOM TOOTH EXTRACTION       Mardella Layman, MD 11/06/17 [phone]

## 2017-12-05 ENCOUNTER — Telehealth: Payer: Self-pay | Admitting: Neurology

## 2017-12-05 NOTE — Telephone Encounter (Signed)
Resubmitted PA for Botox to Medicaid. Called [facility] & spoke to Taylor CornersJackie to do a verbal order for the Botox. Once PA come's back I will call [facility] back and set up delivery.

## 2017-12-14 ENCOUNTER — Ambulatory Visit: Payer: Medicaid Other | Admitting: Neurology

## 2017-12-14 ENCOUNTER — Encounter: Payer: Self-pay | Admitting: Neurology

## 2017-12-14 VITALS — BP 109/73 | HR 80

## 2017-12-14 DIAGNOSIS — G43711 Chronic migraine without aura, intractable, with status migrainosus: Secondary | ICD-10-CM

## 2017-12-14 MED ORDER — FREMANEZUMAB-VFRM 225 MG/1.5ML ~~LOC~~ SOSY: 225.0000 mg | PREFILLED_SYRINGE | SUBCUTANEOUS | 11 refills | Status: DC

## 2017-12-14 NOTE — Progress Notes (Signed)
Botox- 100 units x 2 vials Lot: [id number] Expiration: 04/2020 NDC: 0023-1145-01  Bacteriostatic 0.9% Sodium Chloride- 4mL total Lot: [id number] Expiration: 01/25/2019 NDC: 0409-1966-02  Dx: G43.711 S/P  //BCrn  

## 2017-12-14 NOTE — Progress Notes (Signed)
nterval history: Significantly improved, >50% reduction in migraine frequency.. +levator scapulae bilat,. + masseters and eyes.   Consent Form Botulism Toxin Injection For Chronic Migraine  Botulism toxin has been approved by the Federal drug administration for treatment of chronic migraine. Botulism toxin does not cure chronic migraine and it may not be effective in some patients.  The administration of botulism toxin is accomplished by injecting a small amount of toxin into the muscles of the neck and head. Dosage must be titrated for each individual. Any benefits resulting from botulism toxin tend to wear off after 3 months with a repeat injection required if benefit is to be maintained. Injections are usually done every 3-4 months with maximum effect peak achieved by about 2 or 3 weeks. Botulism toxin is expensive and you should be sure of what costs you will incur resulting from the injection.  The side effects of botulism toxin use for chronic migraine may include:   -Transient, and usually mild, facial weakness with facial injections  -Transient, and usually mild, head or neck weakness with head/neck injections  -Reduction or loss of forehead facial animation due to forehead muscle              weakness  -Eyelid drooping  -Dry eye  -Pain at the site of injection or bruising at the site of injection  -Double vision  -Potential unknown long term risks  Contraindications: You should not have Botox if you are pregnant, nursing, allergic to albumin, have an infection, skin condition, or muscle weakness at the site of the injection, or have myasthenia gravis, Lambert-Eaton syndrome, or ALS.  It is also possible that as with any injection, there may be an allergic reaction or no effect from the medication. Reduced effectiveness after repeated injections is sometimes seen and rarely infection at the injection site may occur. All care will be taken to prevent these side effects. If therapy  is given over a long time, atrophy and wasting in the muscle injected may occur. Occasionally the patient's become refractory to treatment because they develop antibodies to the toxin. In this event, therapy needs to be modified.  I have read the above information and consent to the administration of botulism toxin.    ______________  _____   _________________  Patient signature     Date   Witness signature       BOTOX PROCEDURE NOTE FOR MIGRAINE HEADACHE    Contraindications and precautions discussed with patient(above). Aseptic procedure was observed and patient tolerated procedure. Procedure performed by Dr. Artemio Aly  The condition has existed for more than 6 months, and pt does not have a diagnosis of ALS, Myasthenia Gravis or Lambert-Eaton Syndrome. Risks and benefits of injections discussed and pt agrees to proceed with the procedure. Written consent obtained  These injections are medically necessary. He receives good benefits from these injections. These injections do not cause sedations or hallucinations which the oral therapies may cause.  Indication/Diagnosis: chronic migraine BOTOX(J0585) injection was performed according to protocol by [employer]. 200 units of BOTOX was dissolved into 4 cc NS.  NDC: [address]-78  Description of procedure:  The patient was placed in a sitting position. The standard protocol was used for Botox as follows, with 5 units of Botox injected at each site:   -Procerus muscle, midline injection  -Corrugator muscle, bilateral injection  -Frontalis muscle, bilateral injection, with 2 sites each side, medial injection was performed in the upper one third of the frontalis muscle, in the  region vertical from the medial inferior edge of the superior orbital rim. The lateral injection was again in the upper one third of the forehead vertically above the lateral limbus of the cornea, 1.5 cm lateral to the medial injection site.  -Temporalis muscle  injection, 4 sites, bilaterally. The first injection was 3 cm above the tragus of the ear, second injection site was 1.5 cm to 3 cm up from the first injection site in line with the tragus of the ear. The third injection site was 1.5-3 cm forward between the first 2 injection sites. The fourth injection site was 1.5 cm posterior to the second injection site.  -Occipitalis muscle injection, 3 sites, bilaterally. The first injection was done one half way between the occipital protuberance and the tip of the mastoid process behind the ear. The second injection site was done lateral and superior to the first, 1 fingerbreadth from the first injection. The third injection site was 1 fingerbreadth superiorly and medially from the first injection site.  -Cervical paraspinal muscle injection, 2 sites, bilateral knee first injection site was 1 cm from the midline of the cervical spine, 3 cm inferior to the lower border of the occipital protuberance. The second injection site was 1.5 cm superiorly and laterally to the first injection site.  -Trapezius muscle injection was performed at 3 sites, bilaterally. The first injection site was in the upper trapezius muscle halfway between the inflection point of the neck, and the acromion. The second injection site was one half way between the acromion and the first injection site. The third injection was done between the first injection site and the inflection point of the neck.   Will return for repeat injection in 3 months.   A 200 unit sof Botox was used, 155 units were injected, the rest of the Botox was wasted. The patient tolerated the procedure well, there were no complications of the above procedure.

## 2018-02-15 ENCOUNTER — Telehealth: Payer: Self-pay | Admitting: *Deleted

## 2018-02-15 NOTE — Telephone Encounter (Signed)
Faxed signed Ajovy PA to [facility]. Received a receipt of confirmation.

## 2018-02-20 ENCOUNTER — Other Ambulatory Visit: Payer: Self-pay

## 2018-02-20 ENCOUNTER — Ambulatory Visit: Payer: Medicaid Other | Admitting: Family Medicine

## 2018-02-20 ENCOUNTER — Encounter: Payer: Self-pay | Admitting: Family Medicine

## 2018-02-20 DIAGNOSIS — R222 Localized swelling, mass and lump, trunk: Secondary | ICD-10-CM | POA: Diagnosis present

## 2018-02-20 NOTE — Patient Instructions (Signed)
It was nice meeting you today! You were seen in clinic for a "knot" in your stomach which is most likely calcified fat or a lipoma.  These are benign and should not be bothersome.  However, if you notice it increasing in size or becoming tender, please make an appointment to be seen by a provider.    You can follow up for your annual physical this year.   Freddrick March, MD

## 2018-02-20 NOTE — Progress Notes (Signed)
   Subjective:   Patient ID: Gail Blackburn    DOB: 1976-05-16, 42 y.o. female   MRN: [id number]  CC: "knot" in stomach    "Knot" in stomach Pt reports she noticed this 1 week ago on Saturday.  She found it incidentally when she was touching her stomach.  The bump is not painful to the touch and otherwise does not bother her.  She is unsure if it has been there longer because it is so small and hard to notice.  No fever, chills, nausea, vomiting, diarrhea.  No recent illnesses.  She has not noticed any lumps in breast or axilla. She has vague family history of a cousin passing away from leukemia.  She denies changes in weight, appetite or bowel habits.  She is not currently sexually active.    ROS: No fever, chills, nausea, vomiting, diarrhea.  No abdominal pain, weight loss, change in appetite.   Social: pt is a current smoker, smokes about 2 cigarettes a day, vapes throughout the day, rare alcohol use, no illicit drug use  Medications reviewed. Objective:   BP 100/60   Pulse 86   Temp 98.3 F (36.8 C) (Oral)   Wt 146 lb (66.2 kg)   LMP 01/24/2018   SpO2 96%   BMI 23.93 kg/m  Vitals and nursing note reviewed.   General: well appearing 42 yo female, NAD  HEENT: NCAT, EOMI, PERRL, MMM, o/p clear  Neck: supple, no LAD CV: RRR no MRG  Lungs: CTAB, normal effort  Abdomen: soft, NTND, no organomegaly, superficial 1cm size firm mobile mass localized to LLQ, nontender on exam, no surrounding erythema or swelling  Skin: warm, dry, no rash Extremities: warm and well perfused  Assessment & Plan:   Abdominal wall lump Single 1 cm soft tissue mass localized to LLQ, nontender.  Mobile and firm on palpation without surrounding erythema or fluctuance to suspect abscess.  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  Reassured pt and asked her to monitor it for change in size.   Discussed option of removal if desired. She opts to leave it as is for now as it is not otherwise bothersome.  -Monitor for change in  size -will evaluate at next visit   Freddrick March, MD [facility], PGY-2 02/21/2018 5:16 PM

## 2018-02-21 DIAGNOSIS — R222 Localized swelling, mass and lump, trunk: Secondary | ICD-10-CM | POA: Insufficient documentation

## 2018-02-21 NOTE — Telephone Encounter (Signed)
Spoke with [facility] regarding PA. It was denied due to the medication overuse question not being answered. Need to fill out form again and answer all questions then fax back.

## 2018-02-21 NOTE — Assessment & Plan Note (Signed)
Single 1 cm soft tissue mass localized to LLQ, nontender.  Mobile and firm on palpation without surrounding erythema or fluctuance to suspect abscess.  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  Reassured pt and asked her to monitor it for change in size.   Discussed option of removal if desired. She opts to leave it as is for now as it is not otherwise bothersome.  -Monitor for change in size -will evaluate at next visit

## 2018-02-23 NOTE — Telephone Encounter (Signed)
Pt called she is needing the medication. She is aware a denial was rec'd and the form was to be faxed back but I was not sure if this had been done yet. She understood and is aware the clinic closes at noon today. Please call to advise

## 2018-02-26 MED ORDER — FREMANEZUMAB-VFRM 225 MG/1.5ML ~~LOC~~ SOSY: 225.0000 mg | PREFILLED_SYRINGE | SUBCUTANEOUS | 0 refills | Status: DC

## 2018-02-26 NOTE — Telephone Encounter (Signed)
Pt came office and was given one Ajovy sample. She verbalized appreciation.

## 2018-02-26 NOTE — Telephone Encounter (Addendum)
Called pt and informed her that PA for Ajovy is pending and we should know determination within 24 hours. PA was missing a question but this has been resolved. Pt can have a sample (per v.o. Dr. Lucia GaskinsAhern) in the meantime. She was very appreciative and will come today to pickup one sample of Ajovy.   Sample held aside, order placed for the sample.

## 2018-02-26 NOTE — Addendum Note (Signed)
Addended by: Bertram SavinULBERTSON, BETHANY L on: 02/26/2018 11:33 AM   Modules accepted: Orders

## 2018-02-26 NOTE — Telephone Encounter (Addendum)
PA was faxed on 02/21/18. Received a receipt of confirmation.   02/26/2018 Called [facility]. Call ID# [phone] and was told that one question was missing on this PA, quantity per 30 days of Ajovy. Question answered and [facility]. Expect determination within 24 hours.

## 2018-02-27 ENCOUNTER — Telehealth: Payer: Self-pay | Admitting: Neurology

## 2018-02-27 ENCOUNTER — Other Ambulatory Visit: Payer: Self-pay | Admitting: *Deleted

## 2018-02-27 DIAGNOSIS — Z79899 Other long term (current) drug therapy: Secondary | ICD-10-CM

## 2018-02-27 NOTE — Telephone Encounter (Signed)
Wow I have never had that happen. Patient denied pregnancy. Please order the usine test and talk to patient, this is the first time we have been asked to do this. Just also reiterate she cannot get pregnant on this medication thanks

## 2018-02-27 NOTE — Telephone Encounter (Signed)
Called [facility] and spoke with Lupita LeashDonna. RN told that Ajovy 225 mg had been denied due to the fact that the patient is of childbearing age and needs a negative pregnancy test at baseline.   Interaction ID# [id number]

## 2018-02-27 NOTE — Telephone Encounter (Addendum)
Spoke with patient and discussed that her Ajovy was denied due to the fact that she needs a baseline negative pregnancy test. We can order this for her and she can come by the office anytime during office hours. She verbalized appreciation and understanding. She is aware that she cannot get pregnant while on this medication. Her next Ajovy dose is due July 3rd so she knows to come to the office in plenty of time beforehand to get the test done and repeat PA sent in. She had no concerns or questions.   Order placed for urine pregnancy test.

## 2018-02-27 NOTE — Telephone Encounter (Signed)
I called [facility] to initiate a refill request. Botox scheduled for 03/01/18. DW

## 2018-02-28 DIAGNOSIS — Z0289 Encounter for other administrative examinations: Secondary | ICD-10-CM

## 2018-03-12 NOTE — Telephone Encounter (Signed)
Spoke with patient to inquire about the pregnancy test needed prior to [place] PA. She stated she was without transportation, but she is planning to come in this week. Gave her lab hours, advised no appointment necessary. She verbalized understanding, appreciation for call.

## 2018-03-15 NOTE — Telephone Encounter (Signed)
Noted  

## 2018-03-15 NOTE — Telephone Encounter (Signed)
Pt called she is not wanting to take Ajovy now and will take pregnancy test along with labs on 6/25 at botox appt

## 2018-03-19 ENCOUNTER — Ambulatory Visit: Payer: Medicaid Other | Admitting: Neurology

## 2018-03-20 ENCOUNTER — Ambulatory Visit: Payer: Medicaid Other | Admitting: Neurology

## 2018-03-20 ENCOUNTER — Encounter: Payer: Self-pay | Admitting: Neurology

## 2018-03-20 ENCOUNTER — Ambulatory Visit: Payer: Self-pay | Admitting: Neurology

## 2018-03-20 VITALS — BP 103/74 | HR 73

## 2018-03-20 DIAGNOSIS — G43711 Chronic migraine without aura, intractable, with status migrainosus: Secondary | ICD-10-CM

## 2018-03-20 DIAGNOSIS — M7918 Myalgia, other site: Secondary | ICD-10-CM

## 2018-03-20 DIAGNOSIS — Z79899 Other long term (current) drug therapy: Secondary | ICD-10-CM | POA: Diagnosis not present

## 2018-03-20 MED ORDER — TIZANIDINE HCL 4 MG PO CAPS: 4.0000 mg | ORAL_CAPSULE | Freq: Three times a day (TID) | ORAL | 4 refills | Status: DC | PRN

## 2018-03-20 MED ORDER — FREMANEZUMAB-VFRM 225 MG/1.5ML ~~LOC~~ SOSY: 1.0000 | PREFILLED_SYRINGE | SUBCUTANEOUS | 0 refills | Status: DC

## 2018-03-20 NOTE — Progress Notes (Signed)
Botox- 100 units x 2 vials Lot: [id number] Expiration: 06/2020 NDC: [id number]  Bacteriostatic 0.9% Sodium Chloride- 4mL total Lot: [id number] Expiration: 01/25/2019 NDC: [id number]  Dx: I69.629G43.711 S/P

## 2018-03-20 NOTE — Progress Notes (Signed)
Interval history: Significantly improved, >50% reduction in migraine frequency.. +levator scapulae bilat,. + 7 7 units each masseters, temples and eyes. Please perform dry needling on cervical muscles for cervical myofascial pain. Request [facility] on [address] [employer].  Orders Placed This Encounter  Procedures  . Ambulatory referral to Physical Therapy   Meds ordered this encounter  Medications  . Fremanezumab-vfrm (AJOVY) 225 MG/1.5ML SOSY    Sig: Inject 1 Syringe into the skin every 30 (thirty) days.    Dispense:  1 Syringe    Refill:  0    LOT: [id number] EXP: [id number]  . tiZANidine (ZANAFLEX) 4 MG capsule    Sig: Take 1 capsule (4 mg total) by mouth 3 (three) times daily as needed for muscle spasms.    Dispense:  90 capsule    Refill:  4   Consent Form Botulism Toxin Injection For Chronic Migraine  Botulism toxin has been approved by the Federal drug administration for treatment of chronic migraine. Botulism toxin does not cure chronic migraine and it may not be effective in some patients.  The administration of botulism toxin is accomplished by injecting a small amount of toxin into the muscles of the neck and head. Dosage must be titrated for each individual. Any benefits resulting from botulism toxin tend to wear off after 3 months with a repeat injection required if benefit is to be maintained. Injections are usually done every 3-4 months with maximum effect peak achieved by about 2 or 3 weeks. Botulism toxin is expensive and you should be sure of what costs you will incur resulting from the injection.  The side effects of botulism toxin use for chronic migraine may include:   -Transient, and usually mild, facial weakness with facial injections  -Transient, and usually mild, head or neck weakness with head/neck injections  -Reduction or loss of forehead facial animation due to forehead muscle              weakness  -Eyelid drooping  -Dry eye  -Pain at the site of injection or  bruising at the site of injection  -Double vision  -Potential unknown long term risks  Contraindications: You should not have Botox if you are pregnant, nursing, allergic to albumin, have an infection, skin condition, or muscle weakness at the site of the injection, or have myasthenia gravis, Lambert-Eaton syndrome, or ALS.  It is also possible that as with any injection, there may be an allergic reaction or no effect from the medication. Reduced effectiveness after repeated injections is sometimes seen and rarely infection at the injection site may occur. All care will be taken to prevent these side effects. If therapy is given over a long time, atrophy and wasting in the muscle injected may occur. Occasionally the patient's become refractory to treatment because they develop antibodies to the toxin. In this event, therapy needs to be modified.  I have read the above information and consent to the administration of botulism toxin.    ______________  _____   _________________  Patient signature     Date   Witness signature       BOTOX PROCEDURE NOTE FOR MIGRAINE HEADACHE    Contraindications and precautions discussed with patient(above). Aseptic procedure was observed and patient tolerated procedure. Procedure performed by Dr. Artemio Aly  The condition has existed for more than 6 months, and pt does not have a diagnosis of ALS, Myasthenia Gravis or Lambert-Eaton Syndrome. Risks and benefits of injections discussed and pt agrees to proceed with  the procedure. Written consent obtained  These injections are medically necessary. He receives good benefits from these injections. These injections do not cause sedations or hallucinations which the oral therapies may cause.  Indication/Diagnosis: chronic migraine BOTOX(J0585) injection was performed according to protocol by [employer]. 200 units of BOTOX was dissolved into 4 cc NS.  NDC: [id number]  Description of procedure:  The  patient was placed in a sitting position. The standard protocol was used for Botox as follows, with 5 units of Botox injected at each site:   -Procerus muscle, midline injection  -Corrugator muscle, bilateral injection  -Frontalis muscle, bilateral injection, with 2 sites each side, medial injection was performed in the upper one third of the frontalis muscle, in the region vertical from the medial inferior edge of the superior orbital rim. The lateral injection was again in the upper one third of the forehead vertically above the lateral limbus of the cornea, 1.5 cm lateral to the medial injection site.  -Temporalis muscle injection, 4 sites, bilaterally. The first injection was 3 cm above the tragus of the ear, second injection site was 1.5 cm to 3 cm up from the first injection site in line with the tragus of the ear. The third injection site was 1.5-3 cm forward between the first 2 injection sites. The fourth injection site was 1.5 cm posterior to the second injection site.  -Occipitalis muscle injection, 3 sites, bilaterally. The first injection was done one half way between the occipital protuberance and the tip of the mastoid process behind the ear. The second injection site was done lateral and superior to the first, 1 fingerbreadth from the first injection. The third injection site was 1 fingerbreadth superiorly and medially from the first injection site.  -Cervical paraspinal muscle injection, 2 sites, bilateral knee first injection site was 1 cm from the midline of the cervical spine, 3 cm inferior to the lower border of the occipital protuberance. The second injection site was 1.5 cm superiorly and laterally to the first injection site.  -Trapezius muscle injection was performed at 3 sites, bilaterally. The first injection site was in the upper trapezius muscle halfway between the inflection point of the neck, and the acromion. The second injection site was one half way between the  acromion and the first injection site. The third injection was done between the first injection site and the inflection point of the neck.   Will return for repeat injection in 3 months.   A 200 unit sof Botox was used, 155 units were injected, the rest of the Botox was wasted. The patient tolerated the procedure well, there were no complications of the above procedure.

## 2018-03-20 NOTE — Progress Notes (Signed)
Patient has significant neck pain, ongoing for greater than 6 months, she feels tightness decreased range of motion in the neck.  On examination her bilateral trapezius muscles are hypertrophied with shoulder elevation with decreased range of motion.  She has tried conservative measures such as analgesics, heating, massage and other measures for over 6 months without relief.  Discussed the causes, different treatments which would include trying muscle relaxers, dry needling, massage, physical therapy.  No radicular symptoms or signs of cervical radiculopathy.  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  She would like to go to dry needling and we will start a muscle relaxer today.  Orders Placed This Encounter  Procedures  . Ambulatory referral to Physical Therapy   Meds ordered this encounter  Medications  . tiZANidine (ZANAFLEX) 4 MG capsule    Sig: Take 1 capsule (4 mg total) by mouth 3 (three) times daily as needed for muscle spasms.    Dispense:  90 capsule    Refill:  4    A total of 15 minutes was spent face-to-face with this patient. Over half this time was spent on counseling patient on the cervical myofascial pain syndrome diagnosis and different diagnostic and therapeutic options, risks ans benefits of management, compliance, or risk factor reduction and education.  This does not include any time spent performing Botox procedure today and this is not related to her migraines.

## 2018-03-21 ENCOUNTER — Telehealth: Payer: Self-pay | Admitting: *Deleted

## 2018-03-21 LAB — PREGNANCY, URINE: Preg Test, Ur: NEGATIVE

## 2018-03-21 NOTE — Telephone Encounter (Signed)
Ajovy PA 225 mg done again on 03/21/2018. [facility] along with copy of negative pregnancy test result. Received a receipt of confirmation.

## 2018-03-21 NOTE — Telephone Encounter (Addendum)
Spoke with patient and informed her of negative pregnancy test. Will complete PA for Ajovy again with updated information. Pt verbalized appreciation.   ----- Message from Anson FretAntonia B Ahern, MD sent at 03/21/2018  8:37 AM EDT ----- Negative pregnancy test

## 2018-04-03 ENCOUNTER — Ambulatory Visit: Payer: Medicaid Other | Attending: Neurology

## 2018-04-03 ENCOUNTER — Other Ambulatory Visit: Payer: Self-pay

## 2018-04-03 DIAGNOSIS — M62838 Other muscle spasm: Secondary | ICD-10-CM | POA: Diagnosis present

## 2018-04-03 DIAGNOSIS — M542 Cervicalgia: Secondary | ICD-10-CM | POA: Diagnosis not present

## 2018-04-03 DIAGNOSIS — R293 Abnormal posture: Secondary | ICD-10-CM | POA: Insufficient documentation

## 2018-04-03 NOTE — Therapy (Signed)
[facility] [address] [place], [place], [address] Phone: [phone]   Fax:  [phone]  Physical Therapy Evaluation  Patient Details  Name: Lashawn Bromwell MRN: [id number] Date of Birth: [birth year] Referring Provider: Naomie Dean, MD   Encounter Date: 04/03/2018  PT End of Session - 04/03/18 1408    Visit Number  1    Number of Visits  12    Date for PT Re-Evaluation  05/18/18    Authorization Type  MCD    PT Start Time  0208    PT Stop Time  0245    PT Time Calculation (min)  37 min    Activity Tolerance  Patient tolerated treatment well    Behavior During Therapy  [facility] for tasks assessed/performed       Past Medical History:  Diagnosis Date  . Anemia    PRIOR HISTORY  . Anxiety   . Asthma   . Bipolar disorder (HCC)    Borderline  . Depression   . Fibromyalgia   . Headache    MIGRAINES  . Hypothyroidism 2009   TOOK MEDS FOR FEW WEEKS NO MEDS NOW  . Migraine   . Thyroid disease    treated in past not sure if too high or too low    Past Surgical History:  Procedure Laterality Date  . COLONOSCOPY    . DILATION AND EVACUATION N/A 05/06/2016   Procedure: DILATATION AND EVACUATION;  Surgeon: Huel Cote, MD;  Location: [facility];  Service: Gynecology;  Laterality: N/A;  . FOOT SURGERY    . TEETH PULLED  03/2016  . WISDOM TOOTH EXTRACTION      There were no vitals filed for this visit.   Subjective Assessment - 04/03/18 1416    Subjective  She eports neck pain withut injury.  Related to anxiety/stress/ . Sees mental health professional.   She receives injections with botox. MD wanted DN for treatment.  Reports turning head to RT decr and pain.      Pertinent History  CFS    Limitations  -- Turning neck    Diagnostic tests  MRI last year : negative    Patient Stated Goals  She wants to feel better    Currently in Pain?  Yes    Pain Score  4     Pain Location  Neck    Pain Orientation  Right;Posterior     Pain Descriptors / Indicators  Aching;Burning;Tightness pinch    Pain Type  Chronic pain    Pain Onset  More than a month ago    Pain Frequency  Constant    Aggravating Factors   stress    Pain Relieving Factors  Rest, massage device    Multiple Pain Sites  No         [facility] PT Assessment - 04/03/18 0001      Assessment   Medical Diagnosis  Cervicalgia    Referring Provider  Naomie Dean, MD    Onset Date/Surgical Date  -- 4 years    Prior Therapy  Sees chiropractor,   1 PT visit      Precautions   Precautions  None      Restrictions   Weight Bearing Restrictions  No      Balance Screen   Has the patient fallen in the past 6 months  Yes    How many times?  1 dog tripped so fell down steps    Has the patient had a decrease  in activity level because of a fear of falling?   No    Is the patient reluctant to leave their home because of a fear of falling?   No      Prior Function   Level of Independence  Independent    Vocation  Unemployed      Cognition   Overall Cognitive Status  Within Functional Limits for tasks assessed      ROM / Strength   AROM / PROM / Strength  AROM;PROM;Strength      AROM   AROM Assessment Site  Cervical    Cervical Flexion  50    Cervical Extension  40 extends at upper  cervical spine    Cervical - Right Side Bend  45    Cervical - Left Side Bend  30    Cervical - Right Rotation  70    Cervical - Left Rotation  70      Strength   Overall Strength Comments  UE strength Normal                 Objective measurements completed on examination: See above findings.      [facility] Adult PT Treatment/Exercise - 04/03/18 0001      Self-Care   Self-Care  Posture    Posture  reviewed posture with gentle scap  and cervical retraction. Use of pilllow and decr upper cervical extension to decr possible head aches.              PT Education - 04/03/18 1421    Education Details  POC ,   posture,  DN info sheet    Person(s)  Educated  Patient    Methods  Explanation    Comprehension  Verbalized understanding       PT Short Term Goals - 04/03/18 1410      PT SHORT TERM GOAL #1   Title  Patient will be indepdnent with HEP     Baseline  no program    Time  2    Period  Weeks    Status  New      PT SHORT TERM GOAL #2   Title  Neck pain decr 15-20%     Baseline  Neck pain and headaches vary upt to 7-8/10    Time  2    Period  Weeks    Status  New        PT Long Term Goals - 04/03/18 1410      PT LONG TERM GOAL #1   Title  She will be independent with all hEP issued    Time  6    Period  Weeks    Status  New      PT LONG TERM GOAL #2   Title  she will report neck pain improved 50% or more    Baseline  neck pain improved  20%    Time  6    Period  Weeks    Status  New      PT LONG TERM GOAL #3   Title  cervical sidebend to LT incr to 45 degrees    Baseline  30 at eval    Time  6    Period  Weeks    Status  New      PT LONG TERM GOAL #4   Title  She will demo good posture and able to cervically extend without upper cervical ext initiating movement    Baseline  Cervical extesnion  leaded with upper cervical extension    Time  6    Period  Weeks    Status  New             Plan - 04/03/18 1409    Clinical Impression Statement  Ms Duva presents with chronic cervical pain bilaterally and headaches with abnormal posture , muscle spasm, stiffness of neck, Her symptoms may be fueled by stress and anxiety .  She may benefit from skilled PT to ease pain with muscle spasms and exercies /manual for improved posture    Clinical Presentation  Evolving    Clinical Decision Making  Low    Rehab Potential  Good    PT Frequency  -- 3 visits     PT Duration  2 weeks then 2x/week for 3-4 eeks    PT Treatment/Interventions  Dry needling;Patient/family education;Therapeutic exercise;Taping;Manual techniques;Ultrasound;Moist Heat    PT Next Visit Plan  Dn if available, stretching and  modlities/manual    PT Home Exercise Plan  support for better posture and gentle cervical retraction.     Consulted and Agree with Plan of Care  Patient       Patient will benefit from skilled therapeutic intervention in order to improve the following deficits and impairments:  Pain, Postural dysfunction, Decreased range of motion, Decreased activity tolerance, Increased muscle spasms  Visit Diagnosis: Cervicalgia  Other muscle spasm  Abnormal posture     Problem List Patient Active Problem List   Diagnosis Date Noted  . Abdominal wall lump 02/21/2018  . Chronic fatigue syndrome with fibromyalgia 02/24/2017  . Skin lesion of left leg 02/10/2017  . History of abnormal cervical Pap smear 02/10/2017  . Pregnancy 04/12/2016  . Anxiety 10/15/2014  . Depression 10/15/2014  . Migraine 10/15/2014  . Chronic pain 10/15/2014  . Low TSH level 10/15/2014  . Tobacco use disorder 10/15/2014    Caprice Red  PT 04/03/2018, 2:52 PM  [facility] [address] [place], [place], [address] Phone: [phone]   Fax:  [phone]  Name: Jazel Nimmons MRN: [id number] Date of Birth: [birth year]

## 2018-04-10 ENCOUNTER — Ambulatory Visit: Payer: Medicaid Other | Admitting: Physical Therapy

## 2018-04-12 NOTE — Telephone Encounter (Addendum)
Spoke with Kenard Gowerrew @  [employer] to inquire on [employer] approval. He stated that the [employer] has been approved 03/21/2018 through 03/16/2019. Last claim processed 03/22/18.  Authorization # [id number].  Interaction ID # [id number]

## 2018-04-13 ENCOUNTER — Encounter

## 2018-04-18 ENCOUNTER — Ambulatory Visit: Payer: Medicaid Other | Admitting: Physical Therapy

## 2018-04-18 ENCOUNTER — Telehealth: Payer: Self-pay | Admitting: Physical Therapy

## 2018-04-18 NOTE — Telephone Encounter (Signed)
Left message regarding no show today and advised of next appointment time. Asked she call to reschedule if she is unable to attend.  Doran Nestle C. Keron Neenan PT, DPT 04/18/18 2:23 PM

## 2018-04-25 ENCOUNTER — Encounter: Payer: Self-pay | Admitting: Physical Therapy

## 2018-04-25 ENCOUNTER — Ambulatory Visit: Payer: Medicaid Other | Admitting: Physical Therapy

## 2018-04-25 DIAGNOSIS — R293 Abnormal posture: Secondary | ICD-10-CM

## 2018-04-25 DIAGNOSIS — M542 Cervicalgia: Secondary | ICD-10-CM | POA: Diagnosis not present

## 2018-04-25 DIAGNOSIS — M62838 Other muscle spasm: Secondary | ICD-10-CM

## 2018-04-25 NOTE — Therapy (Signed)
[facility] [address] [place], [place], [address] Phone: [phone]   Fax:  [phone]  Physical Therapy Treatment  Patient Details  Name: Gail Blackburn MRN: [id number] Date of Birth: [birth year] Referring Provider: Naomie DeanAntonia Ahern, MD   Encounter Date: 04/25/2018  PT End of Session - 04/25/18 1408    Visit Number  2    Number of Visits  12    Date for PT Re-Evaluation  05/18/18    Authorization Type  MCD    PT Start Time  1406 pt arrived late    PT Stop Time  1443    PT Time Calculation (min)  37 min    Activity Tolerance  Patient tolerated treatment well    Behavior During Therapy  [facility] tasks assessed/performed       Past Medical History:  Diagnosis Date  . Anemia    PRIOR HISTORY  . Anxiety   . Asthma   . Bipolar disorder (HCC)    Borderline  . Depression   . Fibromyalgia   . Headache    MIGRAINES  . Hypothyroidism 2009   TOOK MEDS FOR FEW WEEKS NO MEDS NOW  . Migraine   . Thyroid disease    treated in past not sure if too high or too low    Past Surgical History:  Procedure Laterality Date  . COLONOSCOPY    . DILATION AND EVACUATION N/A 05/06/2016   Procedure: DILATATION AND EVACUATION;  Surgeon: Huel CoteKathy Richardson, MD;  Location: [facility];  Service: Gynecology;  Laterality: N/A;  . FOOT SURGERY    . TEETH PULLED  03/2016  . WISDOM TOOTH EXTRACTION      There were no vitals filed for this visit.  Subjective Assessment - 04/25/18 1413    Subjective  Always really tight across both shoulders, I am ready to try DN.     Currently in Pain?  Yes    Pain Orientation  Right;Left                       [facility] Adult PT Treatment/Exercise - 04/25/18 0001      Exercises   Exercises  Shoulder      Shoulder Exercises: Seated   Other Seated Exercises  scapular retraction      Shoulder Exercises: Stretch   Wall Stretch - ABduction Limitations  door stretch    Other Shoulder Stretches  upper  trap stretch      Manual Therapy   Manual Therapy  Soft tissue mobilization;Joint mobilization    Manual therapy comments  skilled palpation and monitoring during TPDN    Joint Mobilization  Thoracic PA, upper rib gross ER, first rib depression bil    Soft tissue mobilization  bil upper trap, levator       Trigger Point Dry Needling - 04/25/18 1443    Consent Given?  Yes    Education Handout Provided  -- verbal education    Muscles Treated Upper Body  Upper trapezius;Levator scapulae;Scalenes    Scalenes Response  Twitch reponse elicited;Palpable increased muscle length Lt    Upper Trapezius Response  Twitch reponse elicited;Palpable increased muscle length bil    Levator Scapulae Response  Twitch response elicited;Palpable increased muscle length Lt           PT Education - 04/25/18 1445    Education Details  TPDN & expected outcomes, importance of stretching & posture post needling, CCME auth    Person(s) Educated  Patient  Methods  Explanation;Verbal cues    Comprehension  Verbalized understanding;Need further instruction       PT Short Term Goals - 04/03/18 1410      PT SHORT TERM GOAL #1   Title  Patient will be indepdnent with HEP     Baseline  no program    Time  2    Period  Weeks    Status  New      PT SHORT TERM GOAL #2   Title  Neck pain decr 15-20%     Baseline  Neck pain and headaches vary upt to 7-8/10    Time  2    Period  Weeks    Status  New        PT Long Term Goals - 04/03/18 1410      PT LONG TERM GOAL #1   Title  She will be independent with all hEP issued    Time  6    Period  Weeks    Status  New      PT LONG TERM GOAL #2   Title  she will report neck pain improved 50% or more    Baseline  neck pain improved  20%    Time  6    Period  Weeks    Status  New      PT LONG TERM GOAL #3   Title  cervical sidebend to LT incr to 45 degrees    Baseline  30 at eval    Time  6    Period  Weeks    Status  New      PT LONG TERM GOAL  #4   Title  She will demo good posture and able to cervically extend without upper cervical ext initiating movement    Baseline  Cervical extesnion leaded with upper cervical extension    Time  6    Period  Weeks    Status  New            Plan - 04/25/18 1448    Clinical Impression Statement  Mild twitch response noted to DN and pt reported feeling better. Notable decrease in tightness/spasm in bil shoulder region. Pt lacking flexibility on anterior chain causing scapular retraction to be difficult. Added pectoralis stretch to HEP and encouraged scapular retraction at all times. Will continue as appropriate.     PT Treatment/Interventions  Dry needling;Patient/family education;Therapeutic exercise;Taping;Manual techniques;Ultrasound;Moist Heat    PT Next Visit Plan  DN upper cervical, review stretches    PT Home Exercise Plan  support for better posture and gentle cervical retraction. scap retraction, door pec stretch TID    Consulted and Agree with Plan of Care  Patient       Patient will benefit from skilled therapeutic intervention in order to improve the following deficits and impairments:  Pain, Postural dysfunction, Decreased range of motion, Decreased activity tolerance, Increased muscle spasms  Visit Diagnosis: Cervicalgia  Other muscle spasm  Abnormal posture     Problem List Patient Active Problem List   Diagnosis Date Noted  . Abdominal wall lump 02/21/2018  . Chronic fatigue syndrome with fibromyalgia 02/24/2017  . Skin lesion of left leg 02/10/2017  . History of abnormal cervical Pap smear 02/10/2017  . Pregnancy 04/12/2016  . Anxiety 10/15/2014  . Depression 10/15/2014  . Migraine 10/15/2014  . Chronic pain 10/15/2014  . Low TSH level 10/15/2014  . Tobacco use disorder 10/15/2014   Taisa Deloria C. Sharnae Winfree PT, DPT 04/25/18 2:51  PM   [facility] [address] [place], [place], [address] Phone:  [phone]   Fax:  [phone]  Name: Ashlen Kiger MRN: [id number] Date of Birth: 09/12/1976

## 2018-05-02 ENCOUNTER — Encounter: Payer: Medicaid Other | Admitting: Physical Therapy

## 2018-05-04 ENCOUNTER — Encounter: Payer: Self-pay | Admitting: Physical Therapy

## 2018-05-04 ENCOUNTER — Ambulatory Visit: Payer: Medicaid Other | Attending: Neurology | Admitting: Physical Therapy

## 2018-05-04 ENCOUNTER — Encounter

## 2018-05-04 DIAGNOSIS — M62838 Other muscle spasm: Secondary | ICD-10-CM

## 2018-05-04 DIAGNOSIS — M6281 Muscle weakness (generalized): Secondary | ICD-10-CM | POA: Diagnosis present

## 2018-05-04 DIAGNOSIS — R293 Abnormal posture: Secondary | ICD-10-CM | POA: Diagnosis present

## 2018-05-04 DIAGNOSIS — M542 Cervicalgia: Secondary | ICD-10-CM | POA: Insufficient documentation

## 2018-05-04 NOTE — Therapy (Signed)
[facility] [address] [place], [place], [address] Phone: [phone]   Fax:  [phone]  Physical Therapy Treatment  Patient Details  Name: Gail Blackburn MRN: [id number] Date of Birth: [birth year] Referring Provider: Naomie DeanAntonia Ahern, MD   Encounter Date: 05/04/2018  PT End of Session - 05/04/18 0906    Visit Number  3    Number of Visits  12    Date for PT Re-Evaluation  05/18/18    Authorization Type  MCD    PT Start Time  0901   pt 16 min late   PT Stop Time  0931    PT Time Calculation (min)  30 min    Activity Tolerance  Patient tolerated treatment well    Behavior During Therapy  [facility] for tasks assessed/performed       Past Medical History:  Diagnosis Date  . Anemia    PRIOR HISTORY  . Anxiety   . Asthma   . Bipolar disorder (HCC)    Borderline  . Depression   . Fibromyalgia   . Headache    MIGRAINES  . Hypothyroidism 2009   TOOK MEDS FOR FEW WEEKS NO MEDS NOW  . Migraine   . Thyroid disease    treated in past not sure if too high or too low    Past Surgical History:  Procedure Laterality Date  . COLONOSCOPY    . DILATION AND EVACUATION N/A 05/06/2016   Procedure: DILATATION AND EVACUATION;  Surgeon: Huel CoteKathy Richardson, MD;  Location: [facility];  Service: Gynecology;  Laterality: N/A;  . FOOT SURGERY    . TEETH PULLED  03/2016  . WISDOM TOOTH EXTRACTION      There were no vitals filed for this visit.  Subjective Assessment - 05/04/18 0903    Subjective  "Last session really helped. I am in less pain."    Currently in Pain?  Yes    Pain Score  6     Pain Location  Neck    Pain Orientation  Right;Left    Pain Descriptors / Indicators  Aching   tense   Pain Type  Chronic pain    Pain Onset  More than a month ago    Pain Frequency  Constant                       [facility] Adult PT Treatment/Exercise - 05/04/18 0001      Self-Care   Self-Care  Other Self-Care Comments    Other Self-Care  Comments   teaching of 1st rib mob      Shoulder Exercises: Supine   Protraction  10 reps;Both;AROM;Strengthening      Shoulder Exercises: Seated   Other Seated Exercises  rows, horizontal abduction, shoulder ER, D2 flexion with red band 10 x ea    Other Seated Exercises  1st rib self mobilization x 2 min (R)      Shoulder Exercises: Stretch   Other Shoulder Stretches  upper trap stretch 1 x 30 bilaterally      Manual Therapy   Manual Therapy  Soft tissue mobilization;Joint mobilization    Manual therapy comments  skilled palpation and monitoring during TPDN       Trigger Point Dry Needling - 05/04/18 0909    Consent Given?  Yes    Muscles Treated Upper Body  Upper trapezius   R cervical paraspinals   Upper Trapezius Response  Twitch reponse elicited;Palpable increased muscle length   bilateral  PT Education - 05/04/18 1122    Education Details  importance of exercise in addition to dry needling, updated HEP    Person(s) Educated  Patient    Methods  Explanation;Handout    Comprehension  Verbalized understanding       PT Short Term Goals - 04/03/18 1410      PT SHORT TERM GOAL #1   Title  Patient will be indepdnent with HEP     Baseline  no program    Time  2    Period  Weeks    Status  New      PT SHORT TERM GOAL #2   Title  Neck pain decr 15-20%     Baseline  Neck pain and headaches vary upt to 7-8/10    Time  2    Period  Weeks    Status  New        PT Long Term Goals - 04/03/18 1410      PT LONG TERM GOAL #1   Title  She will be independent with all hEP issued    Time  6    Period  Weeks    Status  New      PT LONG TERM GOAL #2   Title  she will report neck pain improved 50% or more    Baseline  neck pain improved  20%    Time  6    Period  Weeks    Status  New      PT LONG TERM GOAL #3   Title  cervical sidebend to LT incr to 45 degrees    Baseline  30 at eval    Time  6    Period  Weeks    Status  New      PT LONG TERM GOAL  #4   Title  She will demo good posture and able to cervically extend without upper cervical ext initiating movement    Baseline  Cervical extesnion leaded with upper cervical extension    Time  6    Period  Weeks    Status  New            Plan - 05/04/18 1123    Clinical Impression Statement  Limited treatment time due to pt arriving 16 min late. TPDN of R cervical paraspinals and bilateral upper traps. Also focused on shoulder and scapular strengthening. Updated pts HEP to include shoulder/scapular stabilization exercises.     PT Treatment/Interventions  Dry needling;Patient/family education;Therapeutic exercise;Taping;Manual techniques;Ultrasound;Moist Heat    PT Next Visit Plan  DN upper cervical, review stretches, review updated HEP, scapular strength    PT Home Exercise Plan  support for better posture and gentle cervical retraction. scap retraction, door pec stretch TID; rows, shoulder ER, horizontal abduction, and D2 flexion/extension with red band, 1st rib self mob    Consulted and Agree with Plan of Care  Patient       Patient will benefit from skilled therapeutic intervention in order to improve the following deficits and impairments:  Pain, Postural dysfunction, Decreased range of motion, Decreased activity tolerance, Increased muscle spasms  Visit Diagnosis: Cervicalgia  Other muscle spasm  Abnormal posture  Muscle weakness (generalized)     Problem List Patient Active Problem List   Diagnosis Date Noted  . Abdominal wall lump 02/21/2018  . Chronic fatigue syndrome with fibromyalgia 02/24/2017  . Skin lesion of left leg 02/10/2017  . History of abnormal cervical Pap smear 02/10/2017  . Pregnancy  04/12/2016  . Anxiety 10/15/2014  . Depression 10/15/2014  . Migraine 10/15/2014  . Chronic pain 10/15/2014  . Low TSH level 10/15/2014  . Tobacco use disorder 10/15/2014    Laurelyn Sickle, SPT 05/04/18 11:46 AM    [facility]  [facility] [address] [place], [place], [address] Phone: [phone]   Fax:  [phone]  Name: Gail Blackburn MRN: [id number] Date of Birth: Nov 04, 1975

## 2018-05-06 ENCOUNTER — Encounter (HOSPITAL_COMMUNITY): Payer: Self-pay

## 2018-05-06 ENCOUNTER — Ambulatory Visit (HOSPITAL_COMMUNITY)
Admission: EM | Admit: 2018-05-06 | Discharge: 2018-05-06 | Disposition: A | Discharge status: Home / Self Care | Payer: Medicaid Other | Attending: Urgent Care | Admitting: Urgent Care

## 2018-05-06 ENCOUNTER — Other Ambulatory Visit: Payer: Self-pay

## 2018-05-06 DIAGNOSIS — J069 Acute upper respiratory infection, unspecified: Secondary | ICD-10-CM | POA: Diagnosis not present

## 2018-05-06 DIAGNOSIS — H9203 Otalgia, bilateral: Secondary | ICD-10-CM

## 2018-05-06 MED ORDER — PSEUDOEPHEDRINE HCL ER 120 MG PO TB12: 120.0000 mg | ORAL_TABLET | Freq: Two times a day (BID) | ORAL | 3 refills | Status: DC

## 2018-05-06 NOTE — ED Triage Notes (Signed)
Pt has both ears hurt and congestion... This started last Wednesday.

## 2018-05-06 NOTE — Discharge Instructions (Addendum)
Hydrate well with at least 2 liters (1 gallon) of water daily. For sore throat try using a honey-based tea. Use 3 teaspoons of honey with juice squeezed from half lemon. Place shaved pieces of ginger into 1/2-1 cup of water and warm over stove top. Then mix the ingredients and repeat every 4 hours as needed. You may take 500mg Tylenol with ibuprofen 400-600mg every 6 hours for pain and inflammation.  °

## 2018-05-06 NOTE — ED Provider Notes (Signed)
MRN: [id number] DOB: 12-03-1975  Subjective:   Gail Blackburn is a 42 y.o. female presenting for 4 day history of persistent bilateral ear pain, sinus congestion, spit out post-nasal drainage this morning. Has tried ibuprofen, H2O2 in her ears with minimal relief. Denies fever, sinus pain, ear drainage, cough. Smokes ~2 cigarettes per day. Denies history of persistent allergies.   No current facility-administered medications for this encounter.   Current Outpatient Medications:  .  ALPRAZolam (XANAX) 1 MG tablet, Take 0.5-1 mg by mouth 2 (two) times daily as needed., Disp: , Rfl:  .  buprenorphine-naloxone (SUBOXONE) 8-2 MG SUBL SL tablet, Place 1 tablet under the tongue 2 (two) times daily., Disp: , Rfl:  .  celecoxib (CELEBREX) 100 MG capsule, TAKE 1 CAPSULE BY MOUTH TWICE DAILY, Disp: 60 capsule, Rfl: 0 .  doxepin (SINEQUAN) 50 MG capsule, Take 50 mg by mouth at bedtime., Disp: , Rfl:  .  Fremanezumab-vfrm (AJOVY) 225 MG/1.5ML SOSY, Inject 225 mg into the skin every 30 (thirty) days., Disp: 1 Syringe, Rfl: 11 .  Fremanezumab-vfrm (AJOVY) 225 MG/1.5ML SOSY, Inject 1 Syringe into the skin every 30 (thirty) days., Disp: 1 Syringe, Rfl: 0 .  gabapentin (NEURONTIN) 800 MG tablet, Take 800 mg by mouth 3 (three) times daily., Disp: , Rfl: 0 .  ibuprofen (ADVIL,MOTRIN) 800 MG tablet, Take 1 tablet (800 mg total) by mouth 3 (three) times daily. (Patient taking differently: Take 400-800 mg by mouth 3 (three) times daily as needed. ), Disp: 30 tablet, Rfl: 0 .  norethindrone (JENCYCLA) 0.35 MG tablet, Take 1 tablet by mouth daily., Disp: , Rfl:  .  ondansetron (ZOFRAN) 4 MG tablet, Take 1 tablet (4 mg total) by mouth every 6 (six) hours as needed for nausea or vomiting., Disp: 20 tablet, Rfl: 10 .  prochlorperazine (COMPAZINE) 5 MG tablet, Take 1 tablet (5 mg total) by mouth every 6 (six) hours as needed for nausea or migraine/headache, Disp: 60 tablet, Rfl: 1 .  propranolol ER (INDERAL LA) 60 MG 24 hr  capsule, Take 1 capsule (60 mg total) by mouth daily., Disp: 30 capsule, Rfl: 11 .  rizatriptan (MAXALT-MLT) 5 MG disintegrating tablet, DISSOLVE 1 TABLET IN MOUTH AS NEEDED FOR MIGRAINE. MAY REPEAT IN 2 HOURS IF NEEDED. DO NOT TAKE MORE THAN 2 TABLETS IN 24 HOURS, Disp: 10 tablet, Rfl: 3 .  tiZANidine (ZANAFLEX) 4 MG capsule, Take 1 capsule (4 mg total) by mouth 3 (three) times daily as needed for muscle spasms., Disp: 90 capsule, Rfl: 4 .  venlafaxine XR (EFFEXOR-XR) 150 MG 24 hr capsule, Take 150 mg by mouth daily with breakfast., Disp: , Rfl:    Allergies  Allergen Reactions  . Latex Rash    Only in vaginal area    Past Medical History:  Diagnosis Date  . Anemia    PRIOR HISTORY  . Anxiety   . Asthma   . Bipolar disorder (HCC)    Borderline  . Depression   . Fibromyalgia   . Headache    MIGRAINES  . Hypothyroidism 2009   TOOK MEDS FOR FEW WEEKS NO MEDS NOW  . Migraine   . Thyroid disease    treated in past not sure if too high or too low     Past Surgical History:  Procedure Laterality Date  . COLONOSCOPY    . DILATION AND EVACUATION N/A 05/06/2016   Procedure: DILATATION AND EVACUATION;  Surgeon: Huel Cote, MD;  Location: [facility];  Service: Gynecology;  Laterality: N/A;  .  FOOT SURGERY    . TEETH PULLED  03/2016  . WISDOM TOOTH EXTRACTION      Objective:   Vitals: BP 97/67   Pulse 65   Temp 98.2 F (36.8 C) (Oral)   Resp 18   Wt 144 lb (65.3 kg)   LMP 05/06/2018   SpO2 99%   BMI 23.60 kg/m   Physical Exam  Constitutional: She is oriented to person, place, and time. She appears well-developed and well-nourished.  HENT:  TMs opaque bilaterally but without erythema, bulging. No sinus tenderness. Mucosal edema with mild rhinorrhea. Post-nasal drainage is present. No tonsillar exudates.  Eyes: Right eye exhibits no discharge. Left eye exhibits no discharge. No scleral icterus.  Neck: Normal range of motion. Neck supple.  Cardiovascular: Normal rate.   Pulmonary/Chest: Effort normal.  Lymphadenopathy:    She has no cervical adenopathy.  Neurological: She is alert and oriented to person, place, and time.  Skin: Skin is warm and dry.   Assessment and Plan :   Viral URI  Acute ear pain, bilateral  Will manage supportively, anticipatory guidance provided. Return-to-clinic precautions discussed, patient verbalized understanding.    Wallis BambergMani, Lonnette Shrode, [place] JerseyPA-C 05/06/18 [id number]

## 2018-05-07 ENCOUNTER — Encounter: Payer: Self-pay | Admitting: Physical Therapy

## 2018-05-07 ENCOUNTER — Ambulatory Visit: Payer: Medicaid Other | Admitting: Physical Therapy

## 2018-05-07 DIAGNOSIS — M6281 Muscle weakness (generalized): Secondary | ICD-10-CM

## 2018-05-07 DIAGNOSIS — M542 Cervicalgia: Secondary | ICD-10-CM | POA: Diagnosis not present

## 2018-05-07 DIAGNOSIS — M62838 Other muscle spasm: Secondary | ICD-10-CM

## 2018-05-07 DIAGNOSIS — R293 Abnormal posture: Secondary | ICD-10-CM

## 2018-05-07 NOTE — Therapy (Signed)
[place], [place], [address] Phone: [phone]   Fax:  [phone]  Physical Therapy Treatment/Re-authorization  Patient Details  Name: Gail Blackburn MRN: [id number] Date of Birth: 01-May-1976 Referring Provider: Sarina Ill, MD   Encounter Date: 05/07/2018  PT End of Session - 05/07/18 1451    Visit Number  4    Number of Visits  12    Date for PT Re-Evaluation  06/15/18    Authorization Type  MCD re-auth submitted 05/07/18    PT Start Time  1450    PT Stop Time  1528    PT Time Calculation (min)  38 min    Activity Tolerance  Patient tolerated treatment well    Behavior During Therapy  [facility] for tasks assessed/performed       Past Medical History:  Diagnosis Date  . Anemia    PRIOR HISTORY  . Anxiety   . Asthma   . Bipolar disorder ([place])    Borderline  . Depression   . Fibromyalgia   . Headache    MIGRAINES  . Hypothyroidism 2009   TOOK MEDS FOR FEW WEEKS NO MEDS NOW  . Migraine   . Thyroid disease    treated in past not sure if too high or too low    Past Surgical History:  Procedure Laterality Date  . COLONOSCOPY    . DILATION AND EVACUATION N/A 05/06/2016   Procedure: DILATATION AND EVACUATION;  Surgeon: Paula Compton, MD;  Location: [facility];  Service: Gynecology;  Laterality: N/A;  . FOOT SURGERY    . TEETH PULLED  03/2016  . WISDOM TOOTH EXTRACTION      There were no vitals filed for this visit.      [facility] PT Assessment - 05/07/18 0001      Assessment   Medical Diagnosis  Cervicalgia    Referring Provider  Sarina Ill, MD    Onset Date/Surgical Date  --   4 years   Prior Therapy  sees chiropractor      AROM   Cervical Flexion  38    Cervical - Right Side Bend  32   cavitations noted   Cervical - Left Side Bend  32    Cervical - Right Rotation  58    Cervical - Left Rotation  60   pulling on Rt     Strength   Overall Strength Comments  WFL                    [facility] Adult PT Treatment/Exercise - 05/07/18 0001      Exercises   Exercises  Neck      Neck Exercises: Seated   Other Seated Exercise  neutral alignment with visual aid      Neck Exercises: Prone   Shoulder Extension Limitations  scap retraction +GHJ ext    Other Prone Exercise  scapular retraction      Manual Therapy   Manual therapy comments  skilled palpation and monitoring during TPDN    Joint Mobilization  bil first rib depression, thoracic rib gross ER    Soft tissue mobilization  bil upper trap, levator scapula       Trigger Point Dry Needling - 05/07/18 1516    Upper Trapezius Response  Twitch reponse elicited;Palpable increased muscle length   bilat          PT Education - 05/07/18 1530    Education Details  postural alignment, daily & sleeping  postures, use of pillows, craniocradle, goals, POC, HEP    Person(s) Educated  Patient    Methods  Explanation;Verbal cues    Comprehension  Verbalized understanding;Verbal cues required;Need further instruction       PT Short Term Goals - 05/07/18 1452      PT SHORT TERM GOAL #1   Title  Patient will be indepdnent with HEP     Baseline  understands and is able to demo    Status  Achieved      PT SHORT TERM GOAL #2   Title  Neck pain decr 15-20%     Baseline  about 7/10 today, reports decrease 30%    Status  Achieved        PT Long Term Goals - 04/03/18 1410      PT LONG TERM GOAL #1   Title  She will be independent with all hEP issued    Time  6    Period  Weeks    Status  New      PT LONG TERM GOAL #2   Title  she will report neck pain improved 50% or more    Baseline  neck pain improved  20%    Time  6    Period  Weeks    Status  New      PT LONG TERM GOAL #3   Title  cervical sidebend to LT incr to 45 degrees    Baseline  30 at eval    Time  6    Period  Weeks    Status  New      PT LONG TERM GOAL #4   Title  She will demo good posture and able to cervically  extend without upper cervical ext initiating movement    Baseline  Cervical extesnion leaded with upper cervical extension    Time  6    Period  Weeks    Status  New            Plan - 05/07/18 1532    Clinical Impression Statement  Pt has met her short term goals at this time and is experiencing a decrease in pain levels as expected in the short term. Continued detrimental postures notable but pt is improving her ability to correct with fewer cues. Pt will benefit from further skilled PT in order to achieve long term goals, build proper long term HEP and decrease excessive tension noted in cervical musculature that is creating HA pain and limiting daily function.     PT Frequency  2x / week    PT Duration  4 weeks    PT Treatment/Interventions  Dry needling;Patient/family education;Therapeutic exercise;Taping;Manual techniques;Ultrasound;Moist Heat    PT Next Visit Plan  DN PRN, gross periscapular strengthening exercises, evaluate home workout    PT Home Exercise Plan  support for better posture and gentle cervical retraction. scap retraction, door pec stretch TID; rows, shoulder ER, horizontal abduction, and D2 flexion/extension with red band, 1st rib self mob, mirror for alignment    Consulted and Agree with Plan of Care  Patient       Patient will benefit from skilled therapeutic intervention in order to improve the following deficits and impairments:  Pain, Postural dysfunction, Decreased range of motion, Decreased activity tolerance, Increased muscle spasms  Visit Diagnosis: Cervicalgia - Plan: PT plan of care cert/re-cert  Other muscle spasm - Plan: PT plan of care cert/re-cert  Abnormal posture - Plan: PT plan of care cert/re-cert  Muscle  weakness (generalized) - Plan: PT plan of care cert/re-cert     Problem List Patient Active Problem List   Diagnosis Date Noted  . Abdominal wall lump 02/21/2018  . Chronic fatigue syndrome with fibromyalgia 02/24/2017  . Skin  lesion of left leg 02/10/2017  . History of abnormal cervical Pap smear 02/10/2017  . Pregnancy 04/12/2016  . Anxiety 10/15/2014  . Depression 10/15/2014  . Migraine 10/15/2014  . Chronic pain 10/15/2014  . Low TSH level 10/15/2014  . Tobacco use disorder 10/15/2014    Annibelle Brazie C. Clayvon Parlett PT, DPT 05/07/18 3:40 PM   [facility] [address] [place], [place], [address] Phone: [phone]   Fax:  [phone]  Name: Gail Blackburn MRN: [id number] Date of Birth: [birth year]

## 2018-05-17 ENCOUNTER — Ambulatory Visit: Payer: Medicaid Other | Admitting: Physical Therapy

## 2018-05-17 ENCOUNTER — Encounter: Payer: Self-pay | Admitting: Physical Therapy

## 2018-05-17 DIAGNOSIS — R293 Abnormal posture: Secondary | ICD-10-CM

## 2018-05-17 DIAGNOSIS — M542 Cervicalgia: Secondary | ICD-10-CM

## 2018-05-17 DIAGNOSIS — M62838 Other muscle spasm: Secondary | ICD-10-CM

## 2018-05-17 DIAGNOSIS — M6281 Muscle weakness (generalized): Secondary | ICD-10-CM

## 2018-05-17 NOTE — Therapy (Signed)
[facility] [place], [place], [address] Phone: [phone]   Fax:  [phone]  Physical Therapy Treatment  Patient Details  Name: Gail Blackburn MRN: [id number] Date of Birth: Aug 31, 1976 Referring Provider: Naomie DeanAntonia Ahern, MD   Encounter Date: 05/17/2018  PT End of Session - 05/17/18 1627    Visit Number  5    Number of Visits  12    Date for PT Re-Evaluation  06/15/18    Authorization Type  MCD    Authorization - Visit Number  1    Authorization - Number of Visits  12    PT Start Time  1626    PT Stop Time  1720    PT Time Calculation (min)  54 min    Activity Tolerance  Patient tolerated treatment well    Behavior During Therapy  [facility] for tasks assessed/performed       Past Medical History:  Diagnosis Date  . Anemia    PRIOR HISTORY  . Anxiety   . Asthma   . Bipolar disorder (HCC)    Borderline  . Depression   . Fibromyalgia   . Headache    MIGRAINES  . Hypothyroidism 2009   TOOK MEDS FOR FEW WEEKS NO MEDS NOW  . Migraine   . Thyroid disease    treated in past not sure if too high or too low    Past Surgical History:  Procedure Laterality Date  . COLONOSCOPY    . DILATION AND EVACUATION N/A 05/06/2016   Procedure: DILATATION AND EVACUATION;  Surgeon: Huel CoteKathy Richardson, MD;  Location: [facility];  Service: Gynecology;  Laterality: N/A;  . FOOT SURGERY    . TEETH PULLED  03/2016  . WISDOM TOOTH EXTRACTION      There were no vitals filed for this visit.  Subjective Assessment - 05/17/18 1627    Subjective  I have kind of been doing my exercises. Still getting HA frequently but with decreasing intensity.     Patient Stated Goals  She wants to feel better    Currently in Pain?  Yes    Pain Score  7     Pain Location  Neck    Pain Orientation  Right;Left    Pain Descriptors / Indicators  Tightness                       [facility] Adult PT Treatment/Exercise - 05/17/18 0001      Exercises   Exercises  Neck      Neck Exercises: Standing   Neck Retraction  10 reps    Other Standing Exercises  alignment in mirror- line drawn on mirror      Modalities   Modalities  Moist Heat      Moist Heat Therapy   Number Minutes Moist Heat  10 Minutes    Moist Heat Location  Cervical      Manual Therapy   Manual therapy comments  skilled palpation and monitoring during TPDN    Joint Mobilization  Lt first rib depression; Rt to Lt cervical mobs    Soft tissue mobilization  STM to suboccipitals, upper trap, scalenes, SCM      Neck Exercises: Stretches   Other Neck Stretches  scalene stretch    Other Neck Stretches  door pec stretch       Trigger Point Dry Needling - 05/17/18 1706    Muscles Treated Upper Body  Sternocleidomastoid;Scalenes;Upper trapezius;Oblique capitus   suboccipitals  Sternocleidomastoid Response  Twitch response elicited;Palpable increased muscle length    Scalenes Response  Twitch reponse elicited;Palpable increased muscle length    Upper Trapezius Response  Twitch reponse elicited;Palpable increased muscle length    Oblique Capitus Response  Twitch response elicited;Palpable increased muscle length    Levator Scapulae Response  Twitch response elicited;Palpable increased muscle length           PT Education - 05/17/18 1717    Education Details  importance of HEP, rationale for manual treatment, postural alignment    Person(s) Educated  Patient    Methods  Explanation    Comprehension  Verbalized understanding;Need further instruction       PT Short Term Goals - 05/07/18 1452      PT SHORT TERM GOAL #1   Title  Patient will be indepdnent with HEP     Baseline  understands and is able to demo    Status  Achieved      PT SHORT TERM GOAL #2   Title  Neck pain decr 15-20%     Baseline  about 7/10 today, reports decrease 30%    Status  Achieved        PT Long Term Goals - 04/03/18 1410      PT LONG TERM GOAL #1   Title  She  will be independent with all hEP issued    Time  6    Period  Weeks    Status  New      PT LONG TERM GOAL #2   Title  she will report neck pain improved 50% or more    Baseline  neck pain improved  20%    Time  6    Period  Weeks    Status  New      PT LONG TERM GOAL #3   Title  cervical sidebend to LT incr to 45 degrees    Baseline  30 at eval    Time  6    Period  Weeks    Status  New      PT LONG TERM GOAL #4   Title  She will demo good posture and able to cervically extend without upper cervical ext initiating movement    Baseline  Cervical extesnion leaded with upper cervical extension    Time  6    Period  Weeks    Status  New            Plan - 05/17/18 1713    Clinical Impression Statement  pt able to demo improved central alignment of cervical spine following needling. cont to demo slight Lt sidebend of head. pt reported feeling better with treatment and was encouraged to perform HEP on a regular basis. resting posture with hands behind back.     PT Treatment/Interventions  Dry needling;Patient/family education;Therapeutic exercise;Taping;Manual techniques;Ultrasound;Moist Heat    PT Next Visit Plan  DN PRN, gross periscapular strengthening exercises    PT Home Exercise Plan  support for better posture and gentle cervical retraction. scap retraction, door pec stretch TID; rows, shoulder ER, horizontal abduction, and D2 flexion/extension with red band, 1st rib self mob, mirror for alignment    Consulted and Agree with Plan of Care  Patient       Patient will benefit from skilled therapeutic intervention in order to improve the following deficits and impairments:  Pain, Postural dysfunction, Decreased range of motion, Decreased activity tolerance, Increased muscle spasms  Visit Diagnosis: Cervicalgia  Other muscle spasm  Abnormal posture  Muscle weakness (generalized)     Problem List Patient Active Problem List   Diagnosis Date Noted  . Abdominal wall  lump 02/21/2018  . Chronic fatigue syndrome with fibromyalgia 02/24/2017  . Skin lesion of left leg 02/10/2017  . History of abnormal cervical Pap smear 02/10/2017  . Pregnancy 04/12/2016  . Anxiety 10/15/2014  . Depression 10/15/2014  . Migraine 10/15/2014  . Chronic pain 10/15/2014  . Low TSH level 10/15/2014  . Tobacco use disorder 10/15/2014    Kmya Placide C. Katianna Mcclenney PT, DPT 05/17/18 5:18 PM   [facility] [address] [place], [place], [address] Phone: [phone]   Fax:  [phone]  Name: Oliwia Berzins MRN: [id number] Date of Birth: 02/09/1976

## 2018-05-22 ENCOUNTER — Other Ambulatory Visit: Payer: Self-pay | Admitting: Neurology

## 2018-05-23 ENCOUNTER — Encounter: Payer: Self-pay | Admitting: Physical Therapy

## 2018-05-23 ENCOUNTER — Ambulatory Visit: Payer: Medicaid Other | Admitting: Physical Therapy

## 2018-05-23 DIAGNOSIS — M542 Cervicalgia: Secondary | ICD-10-CM | POA: Diagnosis not present

## 2018-05-23 DIAGNOSIS — M6281 Muscle weakness (generalized): Secondary | ICD-10-CM

## 2018-05-23 DIAGNOSIS — R293 Abnormal posture: Secondary | ICD-10-CM

## 2018-05-23 DIAGNOSIS — M62838 Other muscle spasm: Secondary | ICD-10-CM

## 2018-05-23 NOTE — Therapy (Signed)
[facility] [address] [place], [place], [address] Phone: [phone]   Fax:  [phone]  Physical Therapy Treatment  Patient Details  Name: Gail Blackburn MRN: [id number] Date of Birth: January 04, 1976 Referring Provider: Naomie Dean, MD   Encounter Date: 05/23/2018  PT End of Session - 05/23/18 1319    Visit Number  6    Number of Visits  12    Date for PT Re-Evaluation  06/15/18    Authorization Type  MCD    Authorization - Visit Number  2    Authorization - Number of Visits  12    PT Start Time  1318    PT Stop Time  1357    PT Time Calculation (min)  39 min    Activity Tolerance  Patient tolerated treatment well    Behavior During Therapy  [facility] for tasks assessed/performed       Past Medical History:  Diagnosis Date  . Anemia    PRIOR HISTORY  . Anxiety   . Asthma   . Bipolar disorder (HCC)    Borderline  . Depression   . Fibromyalgia   . Headache    MIGRAINES  . Hypothyroidism 2009   TOOK MEDS FOR FEW WEEKS NO MEDS NOW  . Migraine   . Thyroid disease    treated in past not sure if too high or too low    Past Surgical History:  Procedure Laterality Date  . COLONOSCOPY    . DILATION AND EVACUATION N/A 05/06/2016   Procedure: DILATATION AND EVACUATION;  Surgeon: Huel Cote, MD;  Location: [facility];  Service: Gynecology;  Laterality: N/A;  . FOOT SURGERY    . TEETH PULLED  03/2016  . WISDOM TOOTH EXTRACTION      There were no vitals filed for this visit.  Subjective Assessment - 05/23/18 1319    Subjective   a little tension. posterior aspect of neck all the way down. minimal performance of HEP reported    Patient Stated Goals  She wants to feel better    Currently in Pain?  Yes    Pain Score  6     Pain Location  Neck                       [facility] Adult PT Treatment/Exercise - 05/23/18 0001      Exercises   Exercises  Shoulder      Neck Exercises: Seated   Other Seated  Exercise  scapular & cervical retraction      Shoulder Exercises: Supine   Protraction  Both    Protraction Limitations  yellow tband    Other Supine Exercises  diagonals yellow tband      Shoulder Exercises: Seated   Other Seated Exercises  triceps dips      Shoulder Exercises: Standing   External Rotation  20 reps;Theraband    Theraband Level (Shoulder External Rotation)  Level 2 (Red)    Row  Both;20 reps;Theraband    Theraband Level (Shoulder Row)  Level 3 (Green)      Shoulder Exercises: Stretch   Other Shoulder Stretches  door pec stretch      Manual Therapy   Manual Therapy  Manual Traction    Manual therapy comments  skilled palpation and monitoring during TPDN    Joint Mobilization  bil first rib depression gr 4; gross cervical lateral mobs    Soft tissue mobilization  suboccipitals, bil upper traps, cervical paraspinals  Manual Traction  manual cervical traction- 5x30s       Trigger Point Dry Needling - 05/23/18 1340    Upper Trapezius Response  Twitch reponse elicited;Palpable increased muscle length   bilat   Oblique Capitus Response  Twitch response elicited;Palpable increased muscle length   Left            PT Short Term Goals - 05/07/18 1452      PT SHORT TERM GOAL #1   Title  Patient will be indepdnent with HEP     Baseline  understands and is able to demo    Status  Achieved      PT SHORT TERM GOAL #2   Title  Neck pain decr 15-20%     Baseline  about 7/10 today, reports decrease 30%    Status  Achieved        PT Long Term Goals - 04/03/18 1410      PT LONG TERM GOAL #1   Title  She will be independent with all hEP issued    Time  6    Period  Weeks    Status  New      PT LONG TERM GOAL #2   Title  she will report neck pain improved 50% or more    Baseline  neck pain improved  20%    Time  6    Period  Weeks    Status  New      PT LONG TERM GOAL #3   Title  cervical sidebend to LT incr to 45 degrees    Baseline  30 at eval     Time  6    Period  Weeks    Status  New      PT LONG TERM GOAL #4   Title  She will demo good posture and able to cervically extend without upper cervical ext initiating movement    Baseline  Cervical extesnion leaded with upper cervical extension    Time  6    Period  Weeks    Status  New            Plan - 05/23/18 1403    Clinical Impression Statement  Decreased tension but cont to demo forward rounding of shoulders due to tightness of anterior musculature. Added strengthening and encouraged performance of HEP to maintain gains.     PT Treatment/Interventions  Dry needling;Patient/family education;Therapeutic exercise;Taping;Manual techniques;Ultrasound;Moist Heat    PT Next Visit Plan  DN PRN, gross periscapular strengthening exercises    PT Home Exercise Plan  support for better posture and gentle cervical retraction. scap retraction, door pec stretch TID; rows, shoulder ER, horizontal abduction, and D2 flexion/extension with red band, 1st rib self mob, mirror for alignment; rows    Consulted and Agree with Plan of Care  Patient       Patient will benefit from skilled therapeutic intervention in order to improve the following deficits and impairments:  Pain, Postural dysfunction, Decreased range of motion, Decreased activity tolerance, Increased muscle spasms  Visit Diagnosis: Cervicalgia  Other muscle spasm  Abnormal posture  Muscle weakness (generalized)     Problem List Patient Active Problem List   Diagnosis Date Noted  . Abdominal wall lump 02/21/2018  . Chronic fatigue syndrome with fibromyalgia 02/24/2017  . Skin lesion of left leg 02/10/2017  . History of abnormal cervical Pap smear 02/10/2017  . Pregnancy 04/12/2016  . Anxiety 10/15/2014  . Depression 10/15/2014  . Migraine 10/15/2014  .  Chronic pain 10/15/2014  . Low TSH level 10/15/2014  . Tobacco use disorder 10/15/2014    Foday [facility]one C. Roslin Norwood PT, DPT 05/23/18 2:10 PM   [facility] [address] [place], [place], [address] Phone: [phone]   Fax:  [phone]  Name: Gail Blackburn MRN: [id number] Date of Birth: 12-Aug-1976

## 2018-05-25 ENCOUNTER — Encounter: Payer: Self-pay | Admitting: Physical Therapy

## 2018-05-25 ENCOUNTER — Ambulatory Visit: Payer: Medicaid Other | Admitting: Physical Therapy

## 2018-05-25 DIAGNOSIS — M62838 Other muscle spasm: Secondary | ICD-10-CM

## 2018-05-25 DIAGNOSIS — M542 Cervicalgia: Secondary | ICD-10-CM | POA: Diagnosis not present

## 2018-05-25 DIAGNOSIS — M6281 Muscle weakness (generalized): Secondary | ICD-10-CM

## 2018-05-25 DIAGNOSIS — R293 Abnormal posture: Secondary | ICD-10-CM

## 2018-05-25 NOTE — Therapy (Signed)
[facility] Outpatient Rehabilitation [facility] [address] [place], [place], [address] Phone: [phone]   Fax:  [phone]  Physical Therapy Treatment  Patient Details  Name: Gail Blackburn MRN: [id number] Date of Birth: 1976-06-18 Referring Provider: Naomie Dean, MD   Encounter Date: 05/25/2018  PT End of Session - 05/25/18 0948    Visit Number  7    Number of Visits  12    Date for PT Re-Evaluation  06/15/18    Authorization Type  MCD    Authorization - Visit Number  3    Authorization - Number of Visits  12    PT Start Time  [phone]    PT Stop Time  1034    PT Time Calculation (min)  47 min    Activity Tolerance  Patient tolerated treatment well    Behavior During Therapy  [facility] for tasks assessed/performed       Past Medical History:  Diagnosis Date  . Anemia    PRIOR HISTORY  . Anxiety   . Asthma   . Bipolar disorder (HCC)    Borderline  . Depression   . Fibromyalgia   . Headache    MIGRAINES  . Hypothyroidism 2009   TOOK MEDS FOR FEW WEEKS NO MEDS NOW  . Migraine   . Thyroid disease    treated in past not sure if too high or too low    Past Surgical History:  Procedure Laterality Date  . COLONOSCOPY    . DILATION AND EVACUATION N/A 05/06/2016   Procedure: DILATATION AND EVACUATION;  Surgeon: Huel Cote, MD;  Location: [facility];  Service: Gynecology;  Laterality: N/A;  . FOOT SURGERY    . TEETH PULLED  03/2016  . WISDOM TOOTH EXTRACTION      There were no vitals filed for this visit.  Subjective Assessment - 05/25/18 0948    Subjective  It hurts today, mostly Rt side is very tense.    Patient Stated Goals  She wants to feel better    Currently in Pain?  Yes    Pain Location  Shoulder    Pain Orientation  Right    Pain Descriptors / Indicators  Tightness                       [facility] Adult PT Treatment/Exercise - 05/25/18 0001      Shoulder Exercises: Standing   External Rotation  20 reps    Theraband Level  (Shoulder External Rotation)  Level 3 (Green)    Row  20 reps    Theraband Level (Shoulder Row)  Level 4 (Blue)      Shoulder Exercises: Stretch   Other Shoulder Stretches  door pec stretch, wall flexion stretch      Modalities   Modalities  Moist Heat      Moist Heat Therapy   Number Minutes Moist Heat  10 Minutes   3 with HEP education   Moist Heat Location  Shoulder   Rt     Manual Therapy   Manual therapy comments  skilled palpation and monitoring during TPDN    Joint Mobilization  right first rib depression, gross cervical lateral mobs    Soft tissue mobilization  right upper trap, levator, suboccipitals    Manual Traction  manual cervical traction       Trigger Point Dry Needling - 05/25/18 1011    Upper Trapezius Response  Twitch reponse elicited;Palpable increased muscle length   right  PT Education - 05/25/18 1033    Education Details  reduction and importance of HEP, TPDN, maintenance during drive    Person(s) Educated  Patient    Methods  Explanation;Demonstration;Tactile cues;Verbal cues;Handout    Comprehension  Verbalized understanding;Returned demonstration;Verbal cues required;Need further instruction;Tactile cues required       PT Short Term Goals - 05/07/18 1452      PT SHORT TERM GOAL #1   Title  Patient will be indepdnent with HEP     Baseline  understands and is able to demo    Status  Achieved      PT SHORT TERM GOAL #2   Title  Neck pain decr 15-20%     Baseline  about 7/10 today, reports decrease 30%    Status  Achieved        PT Long Term Goals - 04/03/18 1410      PT LONG TERM GOAL #1   Title  She will be independent with all hEP issued    Time  6    Period  Weeks    Status  New      PT LONG TERM GOAL #2   Title  she will report neck pain improved 50% or more    Baseline  neck pain improved  20%    Time  6    Period  Weeks    Status  New      PT LONG TERM GOAL #3   Title  cervical sidebend to LT incr to 45  degrees    Baseline  30 at eval    Time  6    Period  Weeks    Status  New      PT LONG TERM GOAL #4   Title  She will demo good posture and able to cervically extend without upper cervical ext initiating movement    Baseline  Cervical extesnion leaded with upper cervical extension    Time  6    Period  Weeks    Status  New            Plan - 05/25/18 1030    Clinical Impression Statement  Multiple twitches in Rt upper trap today created soreness as expected. Reduced HEP to improve regularity of performance. Pt will be driving to [place] today and was advised to stretch regularly.     PT Treatment/Interventions  Dry needling;Patient/family education;Therapeutic exercise;Taping;Manual techniques;Ultrasound;Moist Heat    PT Next Visit Plan  DN PRN, gross periscapular strengthening exercises    PT Home Exercise Plan  scap retraction, cervical retraction, door pec stretch, wall GHJ flx stretch, row, ext rot band    Consulted and Agree with Plan of Care  Patient       Patient will benefit from skilled therapeutic intervention in order to improve the following deficits and impairments:  Pain, Postural dysfunction, Decreased range of motion, Decreased activity tolerance, Increased muscle spasms  Visit Diagnosis: Cervicalgia  Other muscle spasm  Abnormal posture  Muscle weakness (generalized)     Problem List Patient Active Problem List   Diagnosis Date Noted  . Abdominal wall lump 02/21/2018  . Chronic fatigue syndrome with fibromyalgia 02/24/2017  . Skin lesion of left leg 02/10/2017  . History of abnormal cervical Pap smear 02/10/2017  . Pregnancy 04/12/2016  . Anxiety 10/15/2014  . Depression 10/15/2014  . Migraine 10/15/2014  . Chronic pain 10/15/2014  . Low TSH level 10/15/2014  . Tobacco use disorder 10/15/2014    Ayoub Arey C.  Gergory Biello PT, DPT 05/25/18 10:34 AM   [facility] Outpatient Rehabilitation [facility] [address] [place], [place],  [address] Phone: [phone]   Fax:  [phone]  Name: Shereka Lafortune MRN: [id number] Date of Birth: 1975/10/20

## 2018-05-29 ENCOUNTER — Ambulatory Visit: Payer: Medicaid Other | Admitting: Physical Therapy

## 2018-05-31 ENCOUNTER — Ambulatory Visit: Payer: Medicaid Other | Admitting: Physical Therapy

## 2018-06-05 ENCOUNTER — Encounter: Payer: Self-pay | Admitting: Physical Therapy

## 2018-06-05 ENCOUNTER — Ambulatory Visit: Payer: Medicaid Other | Attending: Neurology | Admitting: Physical Therapy

## 2018-06-05 DIAGNOSIS — M6281 Muscle weakness (generalized): Secondary | ICD-10-CM

## 2018-06-05 DIAGNOSIS — R293 Abnormal posture: Secondary | ICD-10-CM | POA: Insufficient documentation

## 2018-06-05 DIAGNOSIS — M542 Cervicalgia: Secondary | ICD-10-CM | POA: Insufficient documentation

## 2018-06-05 DIAGNOSIS — M62838 Other muscle spasm: Secondary | ICD-10-CM | POA: Diagnosis present

## 2018-06-05 NOTE — Therapy (Signed)
[facility] Outpatient Rehabilitation [facility] [address] [place], [place], [address] Phone: [phone]   Fax:  [phone]  Physical Therapy Treatment  Patient Details  Name: Gail Blackburn MRN: [id number] Date of Birth: 1976/08/04 Referring Provider: Naomie Dean, MD   Encounter Date: 06/05/2018  PT End of Session - 06/05/18 1011    Visit Number  8    Number of Visits  12    Date for PT Re-Evaluation  06/15/18    Authorization Type  MCD    Authorization - Visit Number  4    Authorization - Number of Visits  12    PT Start Time  1010    PT Stop Time  1104    PT Time Calculation (min)  54 min    Activity Tolerance  Patient tolerated treatment well    Behavior During Therapy  [facility] for tasks assessed/performed       Past Medical History:  Diagnosis Date  . Anemia    PRIOR HISTORY  . Anxiety   . Asthma   . Bipolar disorder (HCC)    Borderline  . Depression   . Fibromyalgia   . Headache    MIGRAINES  . Hypothyroidism 2009   TOOK MEDS FOR FEW WEEKS NO MEDS NOW  . Migraine   . Thyroid disease    treated in past not sure if too high or too low    Past Surgical History:  Procedure Laterality Date  . COLONOSCOPY    . DILATION AND EVACUATION N/A 05/06/2016   Procedure: DILATATION AND EVACUATION;  Surgeon: Huel Cote, MD;  Location: [facility];  Service: Gynecology;  Laterality: N/A;  . FOOT SURGERY    . TEETH PULLED  03/2016  . WISDOM TOOTH EXTRACTION      There were no vitals filed for this visit.  Subjective Assessment - 06/05/18 1012    Subjective  Feeling the stress in my right shoulder/neck.     Patient Stated Goals  She wants to feel better    Currently in Pain?  Yes    Pain Score  8     Pain Location  Shoulder    Pain Orientation  Right    Pain Descriptors / Indicators  Tightness         [facility] PT Assessment - 06/05/18 0001      ROM / Strength   AROM / PROM / Strength  AROM      AROM   Overall AROM Comments  before DN in box-  after in side notes    Cervical Flexion  32   40   Cervical - Right Side Bend  28   34   Cervical - Left Side Bend  26   28   Cervical - Right Rotation  60   67   Cervical - Left Rotation  58   60                  [facility] Adult PT Treatment/Exercise - 06/05/18 0001      Modalities   Modalities  Electrical Stimulation      Moist Heat Therapy   Number Minutes Moist Heat  15 Minutes   with ESTIM   Moist Heat Location  Cervical      Electrical Stimulation   Electrical Stimulation Location  cervical    Electrical Stimulation Action  IFC    Electrical Stimulation Parameters  15 min to tol, with heat    Electrical Stimulation Goals  Pain  Manual Therapy   Manual therapy comments  skilled palpation and monitoring during TPDN    Joint Mobilization  bil first rib depression, gross rib ER- focus on Rt, cervical lateral mobs, thoracc PA    Soft tissue mobilization  bil upper trap, levator, suboccipital release    Manual Traction  manual cervical traction       Trigger Point Dry Needling - 06/05/18 1050    Upper Trapezius Response  Twitch reponse elicited;Palpable increased muscle length    Levator Scapulae Response  Twitch response elicited;Palpable increased muscle length           PT Education - 06/05/18 1058    Education Details  ROM, stress and pain, rationale for treatment    Person(s) Educated  Patient    Methods  Explanation    Comprehension  Verbalized understanding;Need further instruction       PT Short Term Goals - 05/07/18 1452      PT SHORT TERM GOAL #1   Title  Patient will be indepdnent with HEP     Baseline  understands and is able to demo    Status  Achieved      PT SHORT TERM GOAL #2   Title  Neck pain decr 15-20%     Baseline  about 7/10 today, reports decrease 30%    Status  Achieved        PT Long Term Goals - 04/03/18 1410      PT LONG TERM GOAL #1   Title  She will be independent with all hEP issued    Time  6     Period  Weeks    Status  New      PT LONG TERM GOAL #2   Title  she will report neck pain improved 50% or more    Baseline  neck pain improved  20%    Time  6    Period  Weeks    Status  New      PT LONG TERM GOAL #3   Title  cervical sidebend to LT incr to 45 degrees    Baseline  30 at eval    Time  6    Period  Weeks    Status  New      PT LONG TERM GOAL #4   Title  She will demo good posture and able to cervically extend without upper cervical ext initiating movement    Baseline  Cervical extesnion leaded with upper cervical extension    Time  6    Period  Weeks    Status  New            Plan - 06/05/18 1054    Clinical Impression Statement  High pain levels noted today after a stressful week with a death in the family. Significant tightness noted in cervical/thoracic region with limited rib mobility. Pt reported decrease in pain following treatment. Before needling pt reported that ROM was less uncomortable to perform.     PT Treatment/Interventions  Dry needling;Patient/family education;Therapeutic exercise;Taping;Manual techniques;Ultrasound;Moist Heat    PT Next Visit Plan  DN PRN, gross periscapular strengthening exercises    PT Home Exercise Plan  scap retraction, cervical retraction, door pec stretch, wall GHJ flx stretch, row, ext rot band    Consulted and Agree with Plan of Care  Patient       Patient will benefit from skilled therapeutic intervention in order to improve the following deficits and impairments:  Pain, Postural dysfunction,  Decreased range of motion, Decreased activity tolerance, Increased muscle spasms  Visit Diagnosis: Cervicalgia  Other muscle spasm  Abnormal posture  Muscle weakness (generalized)     Problem List Patient Active Problem List   Diagnosis Date Noted  . Abdominal wall lump 02/21/2018  . Chronic fatigue syndrome with fibromyalgia 02/24/2017  . Skin lesion of left leg 02/10/2017  . History of abnormal cervical Pap  smear 02/10/2017  . Pregnancy 04/12/2016  . Anxiety 10/15/2014  . Depression 10/15/2014  . Migraine 10/15/2014  . Chronic pain 10/15/2014  . Low TSH level 10/15/2014  . Tobacco use disorder 10/15/2014   Gail Blackburn C. Laureano Hetzer PT, DPT 06/05/18 10:59 AM   [facility] Outpatient Rehabilitation [facility] [address] [place], [place], [address] Phone: [phone]   Fax:  [phone]  Name: Gail Blackburn MRN: [id number] Date of Birth: 04-Nov-1975

## 2018-06-07 ENCOUNTER — Encounter: Payer: Self-pay | Admitting: Physical Therapy

## 2018-06-07 ENCOUNTER — Ambulatory Visit: Payer: Medicaid Other | Admitting: Physical Therapy

## 2018-06-07 DIAGNOSIS — M542 Cervicalgia: Secondary | ICD-10-CM

## 2018-06-07 DIAGNOSIS — M62838 Other muscle spasm: Secondary | ICD-10-CM

## 2018-06-07 DIAGNOSIS — M6281 Muscle weakness (generalized): Secondary | ICD-10-CM

## 2018-06-07 DIAGNOSIS — R293 Abnormal posture: Secondary | ICD-10-CM

## 2018-06-07 NOTE — Therapy (Signed)
[facility] Outpatient Rehabilitation [facility] [address] [place], [place], [address] Phone: [phone]   Fax:  [phone]  Physical Therapy Treatment  Patient Details  Name: Gail Blackburn MRN: [id number] Date of Birth: [birth year] Referring Provider: Naomie Dean, MD   Encounter Date: 06/07/2018  PT End of Session - 06/07/18 1423    Visit Number  9    Number of Visits  12    Date for PT Re-Evaluation  06/15/18    Authorization Type  MCD    Authorization - Visit Number  5    Authorization - Number of Visits  12    PT Start Time  1420   pt arrived late   PT Stop Time  1500    PT Time Calculation (min)  40 min    Activity Tolerance  Patient tolerated treatment well    Behavior During Therapy  [facility] for tasks assessed/performed       Past Medical History:  Diagnosis Date  . Anemia    PRIOR HISTORY  . Anxiety   . Asthma   . Bipolar disorder (HCC)    Borderline  . Depression   . Fibromyalgia   . Headache    MIGRAINES  . Hypothyroidism 2009   TOOK MEDS FOR FEW WEEKS NO MEDS NOW  . Migraine   . Thyroid disease    treated in past not sure if too high or too low    Past Surgical History:  Procedure Laterality Date  . COLONOSCOPY    . DILATION AND EVACUATION N/A 05/06/2016   Procedure: DILATATION AND EVACUATION;  Surgeon: Huel Cote, MD;  Location: [facility];  Service: Gynecology;  Laterality: N/A;  . FOOT SURGERY    . TEETH PULLED  03/2016  . WISDOM TOOTH EXTRACTION      There were no vitals filed for this visit.  Subjective Assessment - 06/07/18 1422    Subjective  Feeling a little better.    Patient Stated Goals  She wants to feel better    Currently in Pain?  Yes    Pain Score  5     Pain Location  Shoulder    Pain Orientation  Right    Pain Descriptors / Indicators  Aching                       [facility] Adult PT Treatment/Exercise - 06/07/18 0001      Exercises   Exercises  Neck      Neck Exercises: Supine   Other  Supine Exercise  supine on bolster- static balance narrow & wide BOS      Neck Exercises: Prone   Shoulder Extension Limitations  retraction + extension    Rows  15 reps   each   Rows Weights (lbs)  3    Rows Limitations  prone row to triceps kick    Other Prone Exercise  qped serratus press    Other Prone Exercise  elbow lifts with hands to low back      Manual Therapy   Manual therapy comments  skilled palpation and monitoring during TPDN    Joint Mobilization  Rt rib cage ER    Soft tissue mobilization  Rt upper trap, suboccipitals, rhomboids      Neck Exercises: Stretches   Other Neck Stretches  thoracic ext over chair       Trigger Point Dry Needling - 06/07/18 1441    Oblique Capitus Response  Twitch response elicited;Palpable increased muscle  length    Rhomboids Response  Twitch response elicited;Palpable increased muscle length             PT Short Term Goals - 05/07/18 1452      PT SHORT TERM GOAL #1   Title  Patient will be indepdnent with HEP     Baseline  understands and is able to demo    Status  Achieved      PT SHORT TERM GOAL #2   Title  Neck pain decr 15-20%     Baseline  about 7/10 today, reports decrease 30%    Status  Achieved        PT Long Term Goals - 04/03/18 1410      PT LONG TERM GOAL #1   Title  She will be independent with all hEP issued    Time  6    Period  Weeks    Status  New      PT LONG TERM GOAL #2   Title  she will report neck pain improved 50% or more    Baseline  neck pain improved  20%    Time  6    Period  Weeks    Status  New      PT LONG TERM GOAL #3   Title  cervical sidebend to LT incr to 45 degrees    Baseline  30 at eval    Time  6    Period  Weeks    Status  New      PT LONG TERM GOAL #4   Title  She will demo good posture and able to cervically extend without upper cervical ext initiating movement    Baseline  Cervical extesnion leaded with upper cervical extension    Time  6    Period  Weeks     Status  New            Plan - 06/07/18 1448    Clinical Impression Statement  Continued tightness noted but demonstrating carryover. Rt rib IR noted at mid thoracic level on Rt side. Increased muscular challenges today but did not add to HEP due ot limited compliance.     PT Treatment/Interventions  Dry needling;Patient/family education;Therapeutic exercise;Taping;Manual techniques;Ultrasound;Moist Heat    PT Next Visit Plan  exercises on soft roller    PT Home Exercise Plan  scap retraction, cervical retraction, door pec stretch, wall GHJ flx stretch, row, ext rot band    Consulted and Agree with Plan of Care  Patient       Patient will benefit from skilled therapeutic intervention in order to improve the following deficits and impairments:  Pain, Postural dysfunction, Decreased range of motion, Decreased activity tolerance, Increased muscle spasms  Visit Diagnosis: Cervicalgia  Other muscle spasm  Abnormal posture  Muscle weakness (generalized)     Problem List Patient Active Problem List   Diagnosis Date Noted  . Abdominal wall lump 02/21/2018  . Chronic fatigue syndrome with fibromyalgia 02/24/2017  . Skin lesion of left leg 02/10/2017  . History of abnormal cervical Pap smear 02/10/2017  . Pregnancy 04/12/2016  . Anxiety 10/15/2014  . Depression 10/15/2014  . Migraine 10/15/2014  . Chronic pain 10/15/2014  . Low TSH level 10/15/2014  . Tobacco use disorder 10/15/2014    Brogen Duell C. Porscha Axley PT, DPT 06/07/18 3:05 PM   [facility] Outpatient Rehabilitation [facility] [address] [place], [place], [address] Phone: [phone]   Fax:  [phone]  Name: Gail Blackburn  MRN: [id number] Date of Birth: 1975/09/30

## 2018-06-11 ENCOUNTER — Ambulatory Visit: Payer: Medicaid Other | Admitting: Physical Therapy

## 2018-06-11 ENCOUNTER — Encounter: Payer: Self-pay | Admitting: Physical Therapy

## 2018-06-11 DIAGNOSIS — M542 Cervicalgia: Secondary | ICD-10-CM

## 2018-06-11 DIAGNOSIS — M62838 Other muscle spasm: Secondary | ICD-10-CM

## 2018-06-11 DIAGNOSIS — M6281 Muscle weakness (generalized): Secondary | ICD-10-CM

## 2018-06-11 DIAGNOSIS — R293 Abnormal posture: Secondary | ICD-10-CM

## 2018-06-11 NOTE — Therapy (Signed)
[facility] [address] [place], [place], [address] Phone: [phone]   Fax:  [phone]  Physical Therapy Treatment  Patient Details  Name: Gail Blackburn MRN: [id number] Date of Birth: 1976/04/29 Referring Provider: Naomie DeanAntonia Ahern, MD   Encounter Date: 06/11/2018  PT End of Session - 06/11/18 1419    Visit Number  10    Number of Visits  13    Date for PT Re-Evaluation  06/25/18    Authorization Type  MCD    Authorization - Visit Number  6    Authorization - Number of Visits  12    PT Start Time  1419    PT Stop Time  1458    PT Time Calculation (min)  39 min    Activity Tolerance  Patient tolerated treatment well    Behavior During Therapy  [facility] for tasks assessed/performed       Past Medical History:  Diagnosis Date  . Anemia    PRIOR HISTORY  . Anxiety   . Asthma   . Bipolar disorder (HCC)    Borderline  . Depression   . Fibromyalgia   . Headache    MIGRAINES  . Hypothyroidism 2009   TOOK MEDS FOR FEW WEEKS NO MEDS NOW  . Migraine   . Thyroid disease    treated in past not sure if too high or too low    Past Surgical History:  Procedure Laterality Date  . COLONOSCOPY    . DILATION AND EVACUATION N/A 05/06/2016   Procedure: DILATATION AND EVACUATION;  Surgeon: Huel CoteKathy Richardson, MD;  Location: [facility];  Service: Gynecology;  Laterality: N/A;  . FOOT SURGERY    . TEETH PULLED  03/2016  . WISDOM TOOTH EXTRACTION      There were no vitals filed for this visit.  Subjective Assessment - 06/11/18 1423    Subjective  tight with high levels of stress today. exercises going well at home, feels that they are helpful.     Patient Stated Goals  She wants to feel better    Currently in Pain?  Yes    Pain Score  6          [facility] PT Assessment - 06/11/18 0001      Assessment   Medical Diagnosis  Cervicalgia    Referring Provider  Naomie DeanAntonia Ahern, MD                   [facility]  Treatment/Exercise - 06/11/18 0001      Neck Exercises: Supine   Other Supine Exercise  supine on roller: NBOS, UE horiz abd, single arm horiz abd, GHJ flx with horiz abd pull on tband, marching      Modalities   Modalities  Traction      Traction   Type of Traction  Cervical    Min (lbs)  5    Max (lbs)  15    Hold Time  60s    Rest Time  20s    Time  10 min               PT Short Term Goals - 05/07/18 1452      PT SHORT TERM GOAL #1   Title  Patient will be indepdnent with HEP     Baseline  understands and is able to demo    Status  Achieved      PT SHORT TERM GOAL #2   Title  Neck pain decr  15-20%     Baseline  about 7/10 today, reports decrease 30%    Status  Achieved        PT Long Term Goals - 04/03/18 1410      PT LONG TERM GOAL #1   Title  She will be independent with all hEP issued    Time  6    Period  Weeks    Status  New      PT LONG TERM GOAL #2   Title  she will report neck pain improved 50% or more    Baseline  neck pain improved  20%    Time  6    Period  Weeks    Status  New      PT LONG TERM GOAL #3   Title  cervical sidebend to LT incr to 45 degrees    Baseline  30 at eval    Time  6    Period  Weeks    Status  New      PT LONG TERM GOAL #4   Title  She will demo good posture and able to cervically extend without upper cervical ext initiating movement    Baseline  Cervical extesnion leaded with upper cervical extension    Time  6    Period  Weeks    Status  New            Plan - 06/11/18 1432    Clinical Impression Statement  Trial of cervical traction today due to bilateral tightness felt. foam roller exercises continued for postural training.     PT Treatment/Interventions  Dry needling;Patient/family education;Therapeutic exercise;Taping;Manual techniques;Ultrasound;Moist Heat;ADLs/Self Care Home Management;Cryotherapy;Electrical Stimulation;Traction;Passive range of motion    PT Next Visit Plan  outcome from  traction? continue soft roller exercises.     PT Home Exercise Plan  scap retraction, cervical retraction, door pec stretch, wall GHJ flx stretch, row, ext rot band    Consulted and Agree with Plan of Care  Patient       Patient will benefit from skilled therapeutic intervention in order to improve the following deficits and impairments:  Pain, Postural dysfunction, Decreased range of motion, Decreased activity tolerance, Increased muscle spasms  Visit Diagnosis: Cervicalgia  Other muscle spasm  Abnormal posture  Muscle weakness (generalized)     Problem List Patient Active Problem List   Diagnosis Date Noted  . Abdominal wall lump 02/21/2018  . Chronic fatigue syndrome with fibromyalgia 02/24/2017  . Skin lesion of left leg 02/10/2017  . History of abnormal cervical Pap smear 02/10/2017  . Pregnancy 04/12/2016  . Anxiety 10/15/2014  . Depression 10/15/2014  . Migraine 10/15/2014  . Chronic pain 10/15/2014  . Low TSH level 10/15/2014  . Tobacco use disorder 10/15/2014    Natale Barba C. Nickoli Bagheri PT, DPT 06/11/18 3:01 PM   [facility] Outpatient Rehabilitation [facility] [address] [place], [place], [address] Phone: [phone]   Fax:  [phone]  Name: Dhanya Bogle MRN: [id number] Date of Birth: 11-Feb-1976

## 2018-06-13 ENCOUNTER — Ambulatory Visit: Payer: Medicaid Other | Admitting: Physical Therapy

## 2018-06-14 ENCOUNTER — Ambulatory Visit: Payer: Medicaid Other | Admitting: Physical Therapy

## 2018-06-14 ENCOUNTER — Encounter: Payer: Self-pay | Admitting: Physical Therapy

## 2018-06-14 DIAGNOSIS — M542 Cervicalgia: Secondary | ICD-10-CM | POA: Diagnosis not present

## 2018-06-14 DIAGNOSIS — R293 Abnormal posture: Secondary | ICD-10-CM

## 2018-06-14 DIAGNOSIS — M6281 Muscle weakness (generalized): Secondary | ICD-10-CM

## 2018-06-14 DIAGNOSIS — M62838 Other muscle spasm: Secondary | ICD-10-CM

## 2018-06-14 NOTE — Therapy (Signed)
[facility] Outpatient Rehabilitation [facility] [address] [place], [place], [address] Phone: [phone]   Fax:  [phone]  Physical Therapy Treatment  Patient Details  Name: Gail Blackburn MRN: [id number] Date of Birth: 1975-12-15 Referring Provider: Naomie Dean, MD   Encounter Date: 06/14/2018  PT End of Session - 06/14/18 1509    Visit Number  11    Number of Visits  13    Date for PT Re-Evaluation  06/25/18    Authorization Type  MCD    Authorization - Visit Number  7    Authorization - Number of Visits  12    PT Start Time  1505    PT Stop Time  1544    PT Time Calculation (min)  39 min    Activity Tolerance  Patient tolerated treatment well    Behavior During Therapy  [facility] for tasks assessed/performed       Past Medical History:  Diagnosis Date  . Anemia    PRIOR HISTORY  . Anxiety   . Asthma   . Bipolar disorder (HCC)    Borderline  . Depression   . Fibromyalgia   . Headache    MIGRAINES  . Hypothyroidism 2009   TOOK MEDS FOR FEW WEEKS NO MEDS NOW  . Migraine   . Thyroid disease    treated in past not sure if too high or too low    Past Surgical History:  Procedure Laterality Date  . COLONOSCOPY    . DILATION AND EVACUATION N/A 05/06/2016   Procedure: DILATATION AND EVACUATION;  Surgeon: Huel Cote, MD;  Location: [facility];  Service: Gynecology;  Laterality: N/A;  . FOOT SURGERY    . TEETH PULLED  03/2016  . WISDOM TOOTH EXTRACTION      There were no vitals filed for this visit.  Subjective Assessment - 06/14/18 1508    Subjective  high stress today    Patient Stated Goals  She wants to feel better    Currently in Pain?  Yes    Pain Score  6     Pain Location  Neck    Pain Orientation  Right    Pain Descriptors / Indicators  Tightness                       [facility] Adult PT Treatment/Exercise - 06/14/18 0001      Neck Exercises: Supine   Other Supine Exercise  supine on roller: balance, NBOS, marching,  horiz abd, scissoring arms 2#, dead bug, snow angels      Manual Therapy   Manual therapy comments  skilled palpation and monitoring during TPDN    Joint Mobilization  Rt first rib depression, lateral cervical glides    Soft tissue mobilization  Rt upper trap, levator, suboccipital release    Manual Traction  cervical       Trigger Point Dry Needling - 06/14/18 1510    Upper Trapezius Response  Twitch reponse elicited;Palpable increased muscle length   Rt            PT Short Term Goals - 05/07/18 1452      PT SHORT TERM GOAL #1   Title  Patient will be indepdnent with HEP     Baseline  understands and is able to demo    Status  Achieved      PT SHORT TERM GOAL #2   Title  Neck pain decr 15-20%     Baseline  about 7/10  today, reports decrease 30%    Status  Achieved        PT Long Term Goals - 04/03/18 1410      PT LONG TERM GOAL #1   Title  She will be independent with all hEP issued    Time  6    Period  Weeks    Status  New      PT LONG TERM GOAL #2   Title  she will report neck pain improved 50% or more    Baseline  neck pain improved  20%    Time  6    Period  Weeks    Status  New      PT LONG TERM GOAL #3   Title  cervical sidebend to LT incr to 45 degrees    Baseline  30 at eval    Time  6    Period  Weeks    Status  New      PT LONG TERM GOAL #4   Title  She will demo good posture and able to cervically extend without upper cervical ext initiating movement    Baseline  Cervical extesnion leaded with upper cervical extension    Time  6    Period  Weeks    Status  New            Plan - 06/14/18 1718    Clinical Impression Statement  continued to challenge strength and balance on roller today. pt is going to order one for home.Discussed stress management to modulate pain because it is keeping her levels high.     PT Treatment/Interventions  Dry needling;Patient/family education;Therapeutic exercise;Taping;Manual  techniques;Ultrasound;Moist Heat;ADLs/Self Care Home Management;Cryotherapy;Electrical Stimulation;Traction;Passive range of motion    PT Next Visit Plan  give soft roller HEP & progress    PT Home Exercise Plan  scap retraction, cervical retraction, door pec stretch, wall GHJ flx stretch, row, ext rot band    Consulted and Agree with Plan of Care  Patient       Patient will benefit from skilled therapeutic intervention in order to improve the following deficits and impairments:  Pain, Postural dysfunction, Decreased range of motion, Decreased activity tolerance, Increased muscle spasms  Visit Diagnosis: Cervicalgia  Other muscle spasm  Abnormal posture  Muscle weakness (generalized)     Problem List Patient Active Problem List   Diagnosis Date Noted  . Abdominal wall lump 02/21/2018  . Chronic fatigue syndrome with fibromyalgia 02/24/2017  . Skin lesion of left leg 02/10/2017  . History of abnormal cervical Pap smear 02/10/2017  . Pregnancy 04/12/2016  . Anxiety 10/15/2014  . Depression 10/15/2014  . Migraine 10/15/2014  . Chronic pain 10/15/2014  . Low TSH level 10/15/2014  . Tobacco use disorder 10/15/2014    Gail Blackburn 06/14/18 5:19 PM   [facility] [address] [place], [place], [address] Phone: [phone]   Fax:  [phone]  Name: Gail Blackburn MRN: [id number] Date of Birth: [birth year]

## 2018-06-20 ENCOUNTER — Encounter: Payer: Self-pay | Admitting: Physical Therapy

## 2018-06-20 ENCOUNTER — Ambulatory Visit: Payer: Medicaid Other | Admitting: Physical Therapy

## 2018-06-20 DIAGNOSIS — M6281 Muscle weakness (generalized): Secondary | ICD-10-CM

## 2018-06-20 DIAGNOSIS — M542 Cervicalgia: Secondary | ICD-10-CM

## 2018-06-20 DIAGNOSIS — R293 Abnormal posture: Secondary | ICD-10-CM

## 2018-06-20 DIAGNOSIS — M62838 Other muscle spasm: Secondary | ICD-10-CM

## 2018-06-20 NOTE — Therapy (Signed)
[facility] Outpatient Rehabilitation [facility] [address] [place], [place], [address] Phone: [phone]   Fax:  [phone]  Physical Therapy Treatment  Patient Details  Name: Gail Blackburn MRN: [id number] Date of Birth: [birth year] Referring Provider: Naomie Dean, MD   Encounter Date: 06/20/2018  PT End of Session - 06/20/18 1637    Visit Number  12    Number of Visits  13    Date for PT Re-Evaluation  06/25/18    Authorization Type  MCD    Authorization - Visit Number  8    Authorization - Number of Visits  12    PT Start Time  1633    PT Stop Time  1714    PT Time Calculation (min)  41 min    Activity Tolerance  Patient tolerated treatment well    Behavior During Therapy  [facility] for tasks assessed/performed       Past Medical History:  Diagnosis Date  . Anemia    PRIOR HISTORY  . Anxiety   . Asthma   . Bipolar disorder (HCC)    Borderline  . Depression   . Fibromyalgia   . Headache    MIGRAINES  . Hypothyroidism 2009   TOOK MEDS FOR FEW WEEKS NO MEDS NOW  . Migraine   . Thyroid disease    treated in past not sure if too high or too low    Past Surgical History:  Procedure Laterality Date  . COLONOSCOPY    . DILATION AND EVACUATION N/A 05/06/2016   Procedure: DILATATION AND EVACUATION;  Surgeon: Huel Cote, MD;  Location: [facility];  Service: Gynecology;  Laterality: N/A;  . FOOT SURGERY    . TEETH PULLED  03/2016  . WISDOM TOOTH EXTRACTION      There were no vitals filed for this visit.  Subjective Assessment - 06/20/18 1637    Subjective  it's getting a little better, still feels tense in Rt upper trap.    Patient Stated Goals  She wants to feel better    Currently in Pain?  Yes    Pain Score  4     Pain Location  Shoulder    Pain Orientation  Right    Pain Descriptors / Indicators  Tightness    Aggravating Factors   stress    Pain Relieving Factors  DN, stretching                       [facility] Adult PT  Treatment/Exercise - 06/20/18 0001      Neck Exercises: Supine   Other Supine Exercise  supine on roller: snow angels, marching,dead bug, bridge, UE horiz abd, UE scissors, perpendicular mobilization      Manual Therapy   Manual therapy comments  skilled palpation and monitoring during TPDN    Joint Mobilization  gross rib ER    Soft tissue mobilization  upper traps bil, rhomboid       Trigger Point Dry Needling - 06/20/18 1656    Upper Trapezius Response  Twitch reponse elicited;Palpable increased muscle length    Rhomboids Response  Twitch response elicited;Palpable increased muscle length             PT Short Term Goals - 05/07/18 1452      PT SHORT TERM GOAL #1   Title  Patient will be indepdnent with HEP     Baseline  understands and is able to demo    Status  Achieved  PT SHORT TERM GOAL #2   Title  Neck pain decr 15-20%     Baseline  about 7/10 today, reports decrease 30%    Status  Achieved        PT Long Term Goals - 04/03/18 1410      PT LONG TERM GOAL #1   Title  She will be independent with all hEP issued    Time  6    Period  Weeks    Status  New      PT LONG TERM GOAL #2   Title  she will report neck pain improved 50% or more    Baseline  neck pain improved  20%    Time  6    Period  Weeks    Status  New      PT LONG TERM GOAL #3   Title  cervical sidebend to LT incr to 45 degrees    Baseline  30 at eval    Time  6    Period  Weeks    Status  New      PT LONG TERM GOAL #4   Title  She will demo good posture and able to cervically extend without upper cervical ext initiating movement    Baseline  Cervical extesnion leaded with upper cervical extension    Time  6    Period  Weeks    Status  New            Plan - 06/20/18 1714    Clinical Impression Statement  instability noted when lifting Lt leg on roller. gave HEP printout for roller. Significant twitching noted in upper traps today on Rt.     PT Treatment/Interventions   Dry needling;Patient/family education;Therapeutic exercise;Taping;Manual techniques;Ultrasound;Moist Heat;ADLs/Self Care Home Management;Cryotherapy;Electrical Stimulation;Traction;Passive range of motion    PT Next Visit Plan  progress HEP    PT Home Exercise Plan  scap retraction, cervical retraction, door pec stretch, wall GHJ flx stretch, row, ext rot band; roller exercises    Consulted and Agree with Plan of Care  Patient       Patient will benefit from skilled therapeutic intervention in order to improve the following deficits and impairments:  Pain, Postural dysfunction, Decreased range of motion, Decreased activity tolerance, Increased muscle spasms  Visit Diagnosis: Cervicalgia  Other muscle spasm  Abnormal posture  Muscle weakness (generalized)     Problem List Patient Active Problem List   Diagnosis Date Noted  . Abdominal wall lump 02/21/2018  . Chronic fatigue syndrome with fibromyalgia 02/24/2017  . Skin lesion of left leg 02/10/2017  . History of abnormal cervical Pap smear 02/10/2017  . Pregnancy 04/12/2016  . Anxiety 10/15/2014  . Depression 10/15/2014  . Migraine 10/15/2014  . Chronic pain 10/15/2014  . Low TSH level 10/15/2014  . Tobacco use disorder 10/15/2014    Xuan Mateus C. Bryssa Tones PT, DPT 06/20/18 5:16 PM   [facility] [address] [place], [place], [address] Phone: [phone]   Fax:  [phone]  Name: Gail Blackburn MRN: [id number] Date of Birth: 10/03/1975

## 2018-06-21 ENCOUNTER — Other Ambulatory Visit: Payer: Self-pay | Admitting: Neurology

## 2018-06-21 ENCOUNTER — Ambulatory Visit: Payer: Medicaid Other | Admitting: Neurology

## 2018-06-21 DIAGNOSIS — G43711 Chronic migraine without aura, intractable, with status migrainosus: Secondary | ICD-10-CM | POA: Diagnosis not present

## 2018-06-21 MED ORDER — DICLOFENAC SODIUM 1 % TD GEL: 4.0000 g | Freq: Four times a day (QID) | TRANSDERMAL | 4 refills | Status: DC

## 2018-06-21 MED ORDER — PROPRANOLOL HCL ER 60 MG PO CP24: 60.0000 mg | ORAL_CAPSULE | Freq: Every day | ORAL | 11 refills | Status: DC

## 2018-06-21 MED ORDER — TIZANIDINE HCL 4 MG PO CAPS: 4.0000 mg | ORAL_CAPSULE | Freq: Three times a day (TID) | ORAL | 4 refills | Status: DC | PRN

## 2018-06-21 MED ORDER — ONDANSETRON HCL 4 MG PO TABS: 4.0000 mg | ORAL_TABLET | Freq: Four times a day (QID) | ORAL | 10 refills | Status: DC | PRN

## 2018-06-21 MED ORDER — RIZATRIPTAN BENZOATE 5 MG PO TBDP: ORAL_TABLET | ORAL | 11 refills | Status: DC

## 2018-06-21 MED ORDER — PROCHLORPERAZINE MALEATE 5 MG PO TABS: ORAL_TABLET | ORAL | 11 refills | Status: DC

## 2018-06-21 NOTE — Progress Notes (Signed)
Interval history: Significantly improved, >50% reduction in migraine frequency.. +levator scapulae bilat,. + 7 7 units each masseters, temples and eyes. Please perform dry needling on cervical muscles for cervical myofascial pain. Request Jacki Cones on [employer]. Baseline: Has daily headaches for the last 10 years. Over 15 a month are migrainous. She Is going to keep a migraine diary.   No orders of the defined types were placed in this encounter.  No orders of the defined types were placed in this encounter.  Consent Form Botulism Toxin Injection For Chronic Migraine  Botulism toxin has been approved by the Federal drug administration for treatment of chronic migraine. Botulism toxin does not cure chronic migraine and it may not be effective in some patients.  The administration of botulism toxin is accomplished by injecting a small amount of toxin into the muscles of the neck and head. Dosage must be titrated for each individual. Any benefits resulting from botulism toxin tend to wear off after 3 months with a repeat injection required if benefit is to be maintained. Injections are usually done every 3-4 months with maximum effect peak achieved by about 2 or 3 weeks. Botulism toxin is expensive and you should be sure of what costs you will incur resulting from the injection.  The side effects of botulism toxin use for chronic migraine may include:   -Transient, and usually mild, facial weakness with facial injections  -Transient, and usually mild, head or neck weakness with head/neck injections  -Reduction or loss of forehead facial animation due to forehead muscle              weakness  -Eyelid drooping  -Dry eye  -Pain at the site of injection or bruising at the site of injection  -Double vision  -Potential unknown long term risks  Contraindications: You should not have Botox if you are pregnant, nursing, allergic to albumin, have an infection, skin condition, or muscle weakness at  the site of the injection, or have myasthenia gravis, Lambert-Eaton syndrome, or ALS.  It is also possible that as with any injection, there may be an allergic reaction or no effect from the medication. Reduced effectiveness after repeated injections is sometimes seen and rarely infection at the injection site may occur. All care will be taken to prevent these side effects. If therapy is given over a long time, atrophy and wasting in the muscle injected may occur. Occasionally the patient's become refractory to treatment because they develop antibodies to the toxin. In this event, therapy needs to be modified.  I have read the above information and consent to the administration of botulism toxin.    ______________  _____   _________________  Patient signature     Date   Witness signature       BOTOX PROCEDURE NOTE FOR MIGRAINE HEADACHE    Contraindications and precautions discussed with patient(above). Aseptic procedure was observed and patient tolerated procedure. Procedure performed by Dr. Artemio Aly  The condition has existed for more than 6 months, and pt does not have a diagnosis of ALS, Myasthenia Gravis or Lambert-Eaton Syndrome. Risks and benefits of injections discussed and pt agrees to proceed with the procedure. Written consent obtained  These injections are medically necessary. He receives good benefits from these injections. These injections do not cause sedations or hallucinations which the oral therapies may cause.  Indication/Diagnosis: chronic migraine BOTOX(J0585) injection was performed according to protocol by [employer]. 200 units of BOTOX was dissolved into 4 cc NS.  NDC:  [phone]  Description of procedure:  The patient was placed in a sitting position. The standard protocol was used for Botox as follows, with 5 units of Botox injected at each site:   -Procerus muscle, midline injection  -Corrugator muscle, bilateral injection  -Frontalis muscle,  bilateral injection, with 2 sites each side, medial injection was performed in the upper one third of the frontalis muscle, in the region vertical from the medial inferior edge of the superior orbital rim. The lateral injection was again in the upper one third of the forehead vertically above the lateral limbus of the cornea, 1.5 cm lateral to the medial injection site.  -Temporalis muscle injection, 4 sites, bilaterally. The first injection was 3 cm above the tragus of the ear, second injection site was 1.5 cm to 3 cm up from the first injection site in line with the tragus of the ear. The third injection site was 1.5-3 cm forward between the first 2 injection sites. The fourth injection site was 1.5 cm posterior to the second injection site.  -Occipitalis muscle injection, 3 sites, bilaterally. The first injection was done one half way between the occipital protuberance and the tip of the mastoid process behind the ear. The second injection site was done lateral and superior to the first, 1 fingerbreadth from the first injection. The third injection site was 1 fingerbreadth superiorly and medially from the first injection site.  -Cervical paraspinal muscle injection, 2 sites, bilateral knee first injection site was 1 cm from the midline of the cervical spine, 3 cm inferior to the lower border of the occipital protuberance. The second injection site was 1.5 cm superiorly and laterally to the first injection site.  -Trapezius muscle injection was performed at 3 sites, bilaterally. The first injection site was in the upper trapezius muscle halfway between the inflection point of the neck, and the acromion. The second injection site was one half way between the acromion and the first injection site. The third injection was done between the first injection site and the inflection point of the neck.   Will return for repeat injection in 3 months.   A 200 unit sof Botox was used, 155 units were injected,  the rest of the Botox was wasted. The patient tolerated the procedure well, there were no complications of the above procedure.

## 2018-06-21 NOTE — Progress Notes (Signed)
Botox- 100 units x 2 vials Lot: [id number] Expiration: 11/2020 NDC: 0023-1145-01  Bacteriostatic 0.9% Sodium Chloride- 4mL total Lot: [id number] Expiration: 06/27/2019 NDC: 0409-1966-02  Dx: G43.711 S/P 

## 2018-06-22 ENCOUNTER — Encounter: Payer: Self-pay | Admitting: Physical Therapy

## 2018-06-22 ENCOUNTER — Ambulatory Visit: Payer: Medicaid Other | Admitting: Physical Therapy

## 2018-06-22 DIAGNOSIS — R293 Abnormal posture: Secondary | ICD-10-CM

## 2018-06-22 DIAGNOSIS — M6281 Muscle weakness (generalized): Secondary | ICD-10-CM

## 2018-06-22 DIAGNOSIS — M542 Cervicalgia: Secondary | ICD-10-CM | POA: Diagnosis not present

## 2018-06-22 DIAGNOSIS — M62838 Other muscle spasm: Secondary | ICD-10-CM

## 2018-06-22 NOTE — Therapy (Signed)
[facility] [address] [place], [place], [address] Phone: [phone]   Fax:  [phone]  Physical Therapy Treatment  Patient Details  Name: Gail Blackburn MRN: [id number] Date of Birth: [birth year] Referring Provider (PT): Naomie Dean, MD   Encounter Date: 06/22/2018  PT End of Session - 06/22/18 0956    Visit Number  13    Number of Visits  13    Date for PT Re-Evaluation  06/25/18    Authorization Type  MCD    Authorization - Visit Number  9    Authorization - Number of Visits  12    PT Start Time  0955    PT Stop Time  1030    PT Time Calculation (min)  35 min    Activity Tolerance  Patient tolerated treatment well    Behavior During Therapy  [facility] for tasks assessed/performed       Past Medical History:  Diagnosis Date  . Anemia    PRIOR HISTORY  . Anxiety   . Asthma   . Bipolar disorder (HCC)    Borderline  . Depression   . Fibromyalgia   . Headache    MIGRAINES  . Hypothyroidism 2009   TOOK MEDS FOR FEW WEEKS NO MEDS NOW  . Migraine   . Thyroid disease    treated in past not sure if too high or too low    Past Surgical History:  Procedure Laterality Date  . COLONOSCOPY    . DILATION AND EVACUATION N/A 05/06/2016   Procedure: DILATATION AND EVACUATION;  Surgeon: Huel Cote, MD;  Location: [facility];  Service: Gynecology;  Laterality: N/A;  . FOOT SURGERY    . TEETH PULLED  03/2016  . WISDOM TOOTH EXTRACTION      There were no vitals filed for this visit.  Subjective Assessment - 06/22/18 0957    Subjective  it feels better after botox. Rt suboccipitals tight    Patient Stated Goals  She wants to feel better    Currently in Pain?  Yes    Pain Score  3     Pain Location  Shoulder    Pain Orientation  Right                       [facility] Adult PT Treatment/Exercise - 06/22/18 0001      Exercises   Exercises  Shoulder      Shoulder Exercises: Standing   External Rotation  20  reps;Both    Theraband Level (Shoulder External Rotation)  Level 2 (Red)    Extension  Both;20 reps    Theraband Level (Shoulder Extension)  Level 2 (Red)    Other Standing Exercises  kettle bell exercises & progressions    Other Standing Exercises  abdominal engagement      Manual Therapy   Manual therapy comments  skilled palpation and monitoring during TPDN    Joint Mobilization  gross rib ER    Soft tissue mobilization  Rt upper trap, bil rhomboid/middle traps, suboccipitals       Trigger Point Dry Needling - 06/22/18 1018    Muscles Treated Upper Body  --   Rt suboccipitals   Upper Trapezius Response  Twitch reponse elicited;Palpable increased muscle length             PT Short Term Goals - 05/07/18 1452      PT SHORT TERM GOAL #1   Title  Patient will be indepdnent with  HEP     Baseline  understands and is able to demo    Status  Achieved      PT SHORT TERM GOAL #2   Title  Neck pain decr 15-20%     Baseline  about 7/10 today, reports decrease 30%    Status  Achieved        PT Long Term Goals - 04/03/18 1410      PT LONG TERM GOAL #1   Title  She will be independent with all hEP issued    Time  6    Period  Weeks    Status  New      PT LONG TERM GOAL #2   Title  she will report neck pain improved 50% or more    Baseline  neck pain improved  20%    Time  6    Period  Weeks    Status  New      PT LONG TERM GOAL #3   Title  cervical sidebend to LT incr to 45 degrees    Baseline  30 at eval    Time  6    Period  Weeks    Status  New      PT LONG TERM GOAL #4   Title  She will demo good posture and able to cervically extend without upper cervical ext initiating movement    Baseline  Cervical extesnion leaded with upper cervical extension    Time  6    Period  Weeks    Status  New            Plan - 06/22/18 1213    Clinical Impression Statement  good tolerance to exercises and decreased tightness after botox. MCD re-eval next visit    PT  Treatment/Interventions  Dry needling;Patient/family education;Therapeutic exercise;Taping;Manual techniques;Ultrasound;Moist Heat;ADLs/Self Care Home Management;Cryotherapy;Electrical Stimulation;Traction;Passive range of motion    PT Next Visit Plan  MCD ERO    PT Home Exercise Plan  scap retraction, cervical retraction, door pec stretch, wall GHJ flx stretch, row, ext rot band; roller exercises    Consulted and Agree with Plan of Care  Patient       Patient will benefit from skilled therapeutic intervention in order to improve the following deficits and impairments:  Pain, Postural dysfunction, Decreased range of motion, Decreased activity tolerance, Increased muscle spasms  Visit Diagnosis: Cervicalgia  Other muscle spasm  Abnormal posture  Muscle weakness (generalized)     Problem List Patient Active Problem List   Diagnosis Date Noted  . Abdominal wall lump 02/21/2018  . Chronic fatigue syndrome with fibromyalgia 02/24/2017  . Skin lesion of left leg 02/10/2017  . History of abnormal cervical Pap smear 02/10/2017  . Pregnancy 04/12/2016  . Anxiety 10/15/2014  . Depression 10/15/2014  . Migraine 10/15/2014  . Chronic pain 10/15/2014  . Low TSH level 10/15/2014  . Tobacco use disorder 10/15/2014    Gail Blackburn C. Stevens Magwood PT, DPT 06/22/18 12:17 PM   [facility] [address] [place], [place], [address] Phone: [phone]   Fax:  [phone]  Name: Gail Blackburn MRN: [id number] Date of Birth: 1976-01-27

## 2018-06-25 ENCOUNTER — Encounter: Payer: Self-pay | Admitting: Physical Therapy

## 2018-06-25 ENCOUNTER — Telehealth: Payer: Self-pay

## 2018-06-25 ENCOUNTER — Ambulatory Visit: Payer: Medicaid Other | Admitting: Physical Therapy

## 2018-06-25 DIAGNOSIS — M6281 Muscle weakness (generalized): Secondary | ICD-10-CM

## 2018-06-25 DIAGNOSIS — M542 Cervicalgia: Secondary | ICD-10-CM

## 2018-06-25 DIAGNOSIS — R293 Abnormal posture: Secondary | ICD-10-CM

## 2018-06-25 DIAGNOSIS — M62838 Other muscle spasm: Secondary | ICD-10-CM

## 2018-06-25 NOTE — Telephone Encounter (Signed)
Pharmacy is requesting to change Rx for Tizanidine 4mg  capsules to tablets due to the capsules not being covered by medicaid. Ok to switch?

## 2018-06-25 NOTE — Therapy (Signed)
[facility] Outpatient Rehabilitation [facility] [address] [place], [place], [address] Phone: [phone]   Fax:  [phone]  Physical Therapy Treatment/Re-evaluation  Patient Details  Name: Gail Blackburn MRN: [id number] Date of Birth: 1976/08/13 Referring Provider (PT): Naomie Dean, MD   Encounter Date: 06/25/2018  PT End of Session - 06/25/18 1544    Visit Number  14    Date for PT Re-Evaluation  06/25/18    Authorization Type  MCD    Authorization - Visit Number  10    Authorization - Number of Visits  12    PT Start Time  1503    PT Stop Time  1544    PT Time Calculation (min)  41 min    Activity Tolerance  Patient tolerated treatment well    Behavior During Therapy  [facility] for tasks assessed/performed       Past Medical History:  Diagnosis Date  . Anemia    PRIOR HISTORY  . Anxiety   . Asthma   . Bipolar disorder (HCC)    Borderline  . Depression   . Fibromyalgia   . Headache    MIGRAINES  . Hypothyroidism 2009   TOOK MEDS FOR FEW WEEKS NO MEDS NOW  . Migraine   . Thyroid disease    treated in past not sure if too high or too low    Past Surgical History:  Procedure Laterality Date  . COLONOSCOPY    . DILATION AND EVACUATION N/A 05/06/2016   Procedure: DILATATION AND EVACUATION;  Surgeon: Huel Cote, MD;  Location: [facility];  Service: Gynecology;  Laterality: N/A;  . FOOT SURGERY    . TEETH PULLED  03/2016  . WISDOM TOOTH EXTRACTION      There were no vitals filed for this visit.  Subjective Assessment - 06/25/18 1505    Subjective  Today it is tight on the same side. The tightness doesn't seem to come on as much, able to use postures and exercises to decrease pain sometimes. Trying to make changes in workouts but wake up tense sometimes. It does not happen every day any more but it does get tight.     Patient Stated Goals  She wants to feel better    Currently in Pain?  Yes    Pain Score  5     Pain Location  Neck    neck/shoulder   Pain Orientation  Right    Pain Descriptors / Indicators  Tightness    Aggravating Factors   stress, unsure    Pain Relieving Factors  DN, postures/stretches         [facility] PT Assessment - 06/25/18 0001      Assessment   Medical Diagnosis  Cervicalgia    Referring Provider (PT)  Naomie Dean, MD    Onset Date/Surgical Date  --   4 years     AROM   Cervical Flexion  30    Cervical - Right Side Bend  27    Cervical - Left Side Bend  32    Cervical - Right Rotation  65    Cervical - Left Rotation  52   tightness noted                  [facility] Adult PT Treatment/Exercise - 06/25/18 0001      Exercises   Exercises  Other Exercises    Other Exercises   eliptical to discuss form/cervical breathing      Traction   Type of  Traction  Cervical    Min (lbs)  5    Max (lbs)  15    Hold Time  60s    Rest Time  20s    Time  15 min             PT Education - 06/25/18 1528    Education Details  goals discussion, anatomy of condition, POC, MCD authorization, importance of HEP    Person(s) Educated  Patient    Methods  Explanation    Comprehension  Verbalized understanding;Need further instruction       PT Short Term Goals - 05/07/18 1452      PT SHORT TERM GOAL #1   Title  Patient will be indepdnent with HEP     Baseline  understands and is able to demo    Status  Achieved      PT SHORT TERM GOAL #2   Title  Neck pain decr 15-20%     Baseline  about 7/10 today, reports decrease 30%    Status  Achieved        PT Long Term Goals - 06/25/18 1508      PT LONG TERM GOAL #1   Title  She will be independent with all hEP issued    Baseline  independent with exercises at this time but would benefit from further progression    Time  8    Period  Weeks    Status  On-going    Target Date  08/20/18      PT LONG TERM GOAL #2   Title  she will report neck pain improved 50% or more    Baseline  40% improved    Time  8    Period  Weeks     Status  On-going    Target Date  08/20/18      PT LONG TERM GOAL #3   Title  cervical sidebend to LT incr to 45 degrees    Baseline  32    Time  8    Period  Weeks    Status  On-going    Target Date  08/20/18      PT LONG TERM GOAL #4   Title  She will demo good posture and able to cervically extend without upper cervical ext initiating movement    Baseline  able to demonstrate proper postural alignment at rest for short periods, cues to remind pt over time    Status  Achieved            Plan - 06/25/18 1522    Clinical Impression Statement  Pt is making good progress toward her goals and has noticed an overall decrease in pain intensity and frequency with continued episodes of tightness. Gross ROM is no longer painful but is limited by tightness in upper traps. Has increased use of postural exercises at home but does require some cuing for proper form and would benefit from progressions for endurance challenges. TPDN has been helpful to decrease tightness allowing pt to obtain proper postural alignment through cervical/shoulder regions and traction used for low-load-long-duration stretching to cervical musculature. Pt would benefit from skilled PT at a frequency of 1/week for 8 weeks to continue progressing postural exercises and utilize skilled interventions to decrease tightness.     PT Frequency  1x / week    PT Duration  8 weeks    PT Treatment/Interventions  Dry needling;Patient/family education;Therapeutic exercise;Taping;Manual techniques;Ultrasound;Moist Heat;ADLs/Self Care Home Management;Cryotherapy;Electrical Stimulation;Traction;Passive range of motion  PT Next Visit Plan  DN PRN, periscapular endurance, lifting, belly breathing    PT Home Exercise Plan  scap retraction, cervical retraction, door pec stretch, wall GHJ flx stretch, row, ext rot band; roller exercises    Consulted and Agree with Plan of Care  Patient       Patient will benefit from skilled therapeutic  intervention in order to improve the following deficits and impairments:  Pain, Postural dysfunction, Decreased range of motion, Decreased activity tolerance, Increased muscle spasms  Visit Diagnosis: Cervicalgia - Plan: PT plan of care cert/re-cert  Other muscle spasm - Plan: PT plan of care cert/re-cert  Abnormal posture - Plan: PT plan of care cert/re-cert  Muscle weakness (generalized) - Plan: PT plan of care cert/re-cert     Problem List Patient Active Problem List   Diagnosis Date Noted  . Abdominal wall lump 02/21/2018  . Chronic fatigue syndrome with fibromyalgia 02/24/2017  . Skin lesion of left leg 02/10/2017  . History of abnormal cervical Pap smear 02/10/2017  . Pregnancy 04/12/2016  . Anxiety 10/15/2014  . Depression 10/15/2014  . Migraine 10/15/2014  . Chronic pain 10/15/2014  . Low TSH level 10/15/2014  . Tobacco use disorder 10/15/2014   Larnell Granlund C. Rasul Decola PT, DPT 06/25/18 4:05 PM   [facility] [address] [place], [place], [address] Phone: [phone]   Fax:  [phone]  Name: Gail Blackburn MRN: [id number] Date of Birth: 07-25-1976

## 2018-06-25 NOTE — Telephone Encounter (Signed)
That's fine. thanks

## 2018-06-26 MED ORDER — TIZANIDINE HCL 4 MG PO TABS: 4.0000 mg | ORAL_TABLET | Freq: Three times a day (TID) | ORAL | 4 refills | Status: DC | PRN

## 2018-06-26 NOTE — Telephone Encounter (Signed)
New rx sent to pharmacy and med list updated.

## 2018-06-26 NOTE — Addendum Note (Signed)
Addended by: Rocky Link A on: 06/26/2018 09:31 AM   Modules accepted: Orders

## 2018-07-09 ENCOUNTER — Ambulatory Visit: Payer: Medicaid Other | Admitting: Physical Therapy

## 2018-07-16 ENCOUNTER — Encounter: Payer: Self-pay | Admitting: Physical Therapy

## 2018-07-16 ENCOUNTER — Ambulatory Visit: Payer: Medicaid Other | Attending: Neurology | Admitting: Physical Therapy

## 2018-07-16 DIAGNOSIS — M62838 Other muscle spasm: Secondary | ICD-10-CM | POA: Diagnosis present

## 2018-07-16 DIAGNOSIS — M6281 Muscle weakness (generalized): Secondary | ICD-10-CM | POA: Diagnosis present

## 2018-07-16 DIAGNOSIS — M542 Cervicalgia: Secondary | ICD-10-CM

## 2018-07-16 DIAGNOSIS — R293 Abnormal posture: Secondary | ICD-10-CM | POA: Insufficient documentation

## 2018-07-16 NOTE — Therapy (Signed)
[facility] Outpatient Rehabilitation [facility] [address] [place], [place], [address] Phone: [phone]   Fax:  [phone]  Physical Therapy Treatment  Patient Details  Name: Gail Blackburn MRN: [id number] Date of Birth: December 30, 1975 Referring Provider (PT): Naomie Dean, MD   Encounter Date: 07/16/2018  PT End of Session - 07/16/18 1507    Visit Number  15    Date for PT Re-Evaluation  08/20/18    Authorization Type  MCD 8 visits 1/week through 12/7    Authorization - Visit Number  1    Authorization - Number of Visits  8    PT Start Time  1507   pt arrived late   PT Stop Time  1546    PT Time Calculation (min)  39 min    Activity Tolerance  Patient tolerated treatment well    Behavior During Therapy  [facility] for tasks assessed/performed       Past Medical History:  Diagnosis Date  . Anemia    PRIOR HISTORY  . Anxiety   . Asthma   . Bipolar disorder (HCC)    Borderline  . Depression   . Fibromyalgia   . Headache    MIGRAINES  . Hypothyroidism 2009   TOOK MEDS FOR FEW WEEKS NO MEDS NOW  . Migraine   . Thyroid disease    treated in past not sure if too high or too low    Past Surgical History:  Procedure Laterality Date  . COLONOSCOPY    . DILATION AND EVACUATION N/A 05/06/2016   Procedure: DILATATION AND EVACUATION;  Surgeon: Huel Cote, MD;  Location: [facility];  Service: Gynecology;  Laterality: N/A;  . FOOT SURGERY    . TEETH PULLED  03/2016  . WISDOM TOOTH EXTRACTION      There were no vitals filed for this visit.  Subjective Assessment - 07/16/18 1508    Subjective  I had a really bad tension HA. Really tight on Rt side. I know it's the stress.     Patient Stated Goals  She wants to feel better    Currently in Pain?  Yes    Pain Score  4     Pain Location  Shoulder    Pain Orientation  Right    Pain Descriptors / Indicators  Tightness    Aggravating Factors   stress    Pain Relieving Factors  DN                        [facility] Adult PT Treatment/Exercise - 07/16/18 0001      Traction   Type of Traction  Cervical    Min (lbs)  5    Max (lbs)  17    Hold Time  60s    Rest Time  20s    Time  15 min      Manual Therapy   Manual therapy comments  skilled palpation and monitoring during TPDN    Joint Mobilization  upper thoracic rib ER, bil first rib depression, upper thoracic PA & lateral mobs    Soft tissue mobilization  Rt upper trap       Trigger Point Dry Needling - 07/16/18 1532    Upper Trapezius Response  Twitch reponse elicited;Palpable increased muscle length   Rt            PT Short Term Goals - 05/07/18 1452      PT SHORT TERM GOAL #1   Title  Patient  will be indepdnent with HEP     Baseline  understands and is able to demo    Status  Achieved      PT SHORT TERM GOAL #2   Title  Neck pain decr 15-20%     Baseline  about 7/10 today, reports decrease 30%    Status  Achieved        PT Long Term Goals - 06/25/18 1508      PT LONG TERM GOAL #1   Title  She will be independent with all hEP issued    Baseline  independent with exercises at this time but would benefit from further progression    Time  8    Period  Weeks    Status  On-going    Target Date  08/20/18      PT LONG TERM GOAL #2   Title  she will report neck pain improved 50% or more    Baseline  40% improved    Time  8    Period  Weeks    Status  On-going    Target Date  08/20/18      PT LONG TERM GOAL #3   Title  cervical sidebend to LT incr to 45 degrees    Baseline  32    Time  8    Period  Weeks    Status  On-going    Target Date  08/20/18      PT LONG TERM GOAL #4   Title  She will demo good posture and able to cervically extend without upper cervical ext initiating movement    Baseline  able to demonstrate proper postural alignment at rest for short periods, cues to remind pt over time    Status  Achieved            Plan - 07/16/18 1644    Clinical  Impression Statement  Discussion regarding stress reduction and controlling tightness as a symptom of a larger problem. Pt is going to purchase bolster. Reported significant improvements after treatment today.     PT Treatment/Interventions  Dry needling;Patient/family education;Therapeutic exercise;Taping;Manual techniques;Ultrasound;Moist Heat;ADLs/Self Care Home Management;Cryotherapy;Electrical Stimulation;Traction;Passive range of motion    PT Next Visit Plan  DN PRN, periscapular endurance, lifting, belly breathing    PT Home Exercise Plan  scap retraction, cervical retraction, door pec stretch, wall GHJ flx stretch, row, ext rot band; roller exercises    Consulted and Agree with Plan of Care  Patient       Patient will benefit from skilled therapeutic intervention in order to improve the following deficits and impairments:  Pain, Postural dysfunction, Decreased range of motion, Decreased activity tolerance, Increased muscle spasms  Visit Diagnosis: Cervicalgia  Other muscle spasm  Abnormal posture  Muscle weakness (generalized)     Problem List Patient Active Problem List   Diagnosis Date Noted  . Abdominal wall lump 02/21/2018  . Chronic fatigue syndrome with fibromyalgia 02/24/2017  . Skin lesion of left leg 02/10/2017  . History of abnormal cervical Pap smear 02/10/2017  . Pregnancy 04/12/2016  . Anxiety 10/15/2014  . Depression 10/15/2014  . Migraine 10/15/2014  . Chronic pain 10/15/2014  . Low TSH level 10/15/2014  . Tobacco use disorder 10/15/2014   Natanya Holecek C. Haleemah Buckalew PT, DPT 07/16/18 4:48 PM   [facility] [address] [place], [place], [address] Phone: [phone]   Fax:  [phone]  Name: Gail Blackburn MRN: [id number] Date of Birth: July 03, 1976

## 2018-07-24 ENCOUNTER — Ambulatory Visit: Payer: Medicaid Other | Admitting: Physical Therapy

## 2018-07-24 ENCOUNTER — Encounter: Payer: Self-pay | Admitting: Physical Therapy

## 2018-07-24 DIAGNOSIS — M6281 Muscle weakness (generalized): Secondary | ICD-10-CM

## 2018-07-24 DIAGNOSIS — M542 Cervicalgia: Secondary | ICD-10-CM

## 2018-07-24 DIAGNOSIS — R293 Abnormal posture: Secondary | ICD-10-CM

## 2018-07-24 DIAGNOSIS — M62838 Other muscle spasm: Secondary | ICD-10-CM

## 2018-07-24 NOTE — Therapy (Addendum)
[facility] [address] [place], [place], [address] Phone: [phone]   Fax:  [phone]  Physical Therapy Treatment  Patient Details  Name: Gail Blackburn MRN: [id number] Date of Birth: [birth year] Referring Provider (PT): Naomie Dean, MD   Encounter Date: 07/24/2018  PT End of Session - 07/24/18 1554    Visit Number  16    Date for PT Re-Evaluation  08/20/18    Authorization Type  MCD 8 visits 1/week through 12/7    Authorization - Visit Number  2    Authorization - Number of Visits  8    PT Start Time  1553    PT Stop Time  1633    PT Time Calculation (min)  40 min    Activity Tolerance  Patient tolerated treatment well    Behavior During Therapy  [facility] for tasks assessed/performed       Past Medical History:  Diagnosis Date  . Anemia    PRIOR HISTORY  . Anxiety   . Asthma   . Bipolar disorder (HCC)    Borderline  . Depression   . Fibromyalgia   . Headache    MIGRAINES  . Hypothyroidism 2009   TOOK MEDS FOR FEW WEEKS NO MEDS NOW  . Migraine   . Thyroid disease    treated in past not sure if too high or too low    Past Surgical History:  Procedure Laterality Date  . COLONOSCOPY    . DILATION AND EVACUATION N/A 05/06/2016   Procedure: DILATATION AND EVACUATION;  Surgeon: Huel Cote, MD;  Location: [facility];  Service: Gynecology;  Laterality: N/A;  . FOOT SURGERY    . TEETH PULLED  03/2016  . WISDOM TOOTH EXTRACTION      There were no vitals filed for this visit.  Subjective Assessment - 07/24/18 1554    Subjective  Yesterday about a 7/10, today abotu a 4. Still tight. May be doing exercises wrong    Patient Stated Goals  She wants to feel better    Currently in Pain?  Yes    Pain Score  4     Pain Location  Neck    Pain Orientation  Right    Pain Descriptors / Indicators  Tightness                       [facility] Adult PT Treatment/Exercise - 07/24/18 0001      Shoulder  Exercises: Seated   Other Seated Exercises  scapular retraction      Shoulder Exercises: Standing   Extension  15 reps    Theraband Level (Shoulder Extension)  Level 3 (Green)    Row  15 reps    Theraband Level (Shoulder Row)  Level 3 (Green)    Other Standing Exercises  chest press green tband, resisted ext from full flexion green band    Other Standing Exercises  plank on table with LE lift- heavy cues      Manual Therapy   Manual therapy comments  skilled palpation and monitoring during TPDN    Joint Mobilization  gross rib ER, thoracic PA    Soft tissue mobilization  Thoracic paraspinals      Neck Exercises: Stretches   Other Neck Stretches  rotation SNAG    Other Neck Stretches  child's pose       Trigger Point Dry Needling - 07/24/18 1611    Muscles Treated Upper Body  --   thoracic paraspinals  T3-T6 bil            PT Short Term Goals - 05/07/18 1452      PT SHORT TERM GOAL #1   Title  Patient will be indepdnent with HEP     Baseline  understands and is able to demo    Status  Achieved      PT SHORT TERM GOAL #2   Title  Neck pain decr 15-20%     Baseline  about 7/10 today, reports decrease 30%    Status  Achieved        PT Long Term Goals - 06/25/18 1508      PT LONG TERM GOAL #1   Title  She will be independent with all hEP issued    Baseline  independent with exercises at this time but would benefit from further progression    Time  8    Period  Weeks    Status  On-going    Target Date  08/20/18      PT LONG TERM GOAL #2   Title  she will report neck pain improved 50% or more    Baseline  40% improved    Time  8    Period  Weeks    Status  On-going    Target Date  08/20/18      PT LONG TERM GOAL #3   Title  cervical sidebend to LT incr to 45 degrees    Baseline  32    Time  8    Period  Weeks    Status  On-going    Target Date  08/20/18      PT LONG TERM GOAL #4   Title  She will demo good posture and able to cervically extend  without upper cervical ext initiating movement    Baseline  able to demonstrate proper postural alignment at rest for short periods, cues to remind pt over time    Status  Achieved            Plan - 07/24/18 1643    Clinical Impression Statement  TPDN to thoracic paraspinals with noted improvement in ribs and thoracic vertebrae  following, pt reports she felt much better. Added a few exercises to HEP to supplement home workout. Educated on proper form with stretching. Pt requests cervical traction at next visit.     PT Treatment/Interventions  Dry needling;Patient/family education;Therapeutic exercise;Taping;Manual techniques;Ultrasound;Moist Heat;ADLs/Self Care Home Management;Cryotherapy;Electrical Stimulation;Traction;Passive range of motion    PT Next Visit Plan  DN PRN, periscapular endurance, lifting, belly breathing, objective measures.     PT Home Exercise Plan  scap retraction, cervical retraction, door pec stretch, wall GHJ flx stretch, row, ext rot band; roller exercises; plank, child pose, SNAG    Consulted and Agree with Plan of Care  Patient       Patient will benefit from skilled therapeutic intervention in order to improve the following deficits and impairments:  Pain, Postural dysfunction, Decreased range of motion, Decreased activity tolerance, Increased muscle spasms  Visit Diagnosis: Cervicalgia  Other muscle spasm  Abnormal posture  Muscle weakness (generalized)     Problem List Patient Active Problem List   Diagnosis Date Noted  . Abdominal wall lump 02/21/2018  . Chronic fatigue syndrome with fibromyalgia 02/24/2017  . Skin lesion of left leg 02/10/2017  . History of abnormal cervical Pap smear 02/10/2017  . Pregnancy 04/12/2016  . Anxiety 10/15/2014  . Depression 10/15/2014  . Migraine 10/15/2014  . Chronic pain  10/15/2014  . Low TSH level 10/15/2014  . Tobacco use disorder 10/15/2014    Larron Armor C. Sherron Mummert PT, DPT 07/24/18 4:52 PM   [facility] Outpatient Rehabilitation [facility] [address] [place], [place], [address] Phone: [phone]   Fax:  [phone]  Name: Gail Blackburn MRN: [id number] Date of Birth: August 27, 1976

## 2018-07-30 ENCOUNTER — Encounter: Payer: Self-pay | Admitting: Physical Therapy

## 2018-07-30 ENCOUNTER — Ambulatory Visit: Payer: Medicaid Other | Attending: Neurology | Admitting: Physical Therapy

## 2018-07-30 DIAGNOSIS — M6281 Muscle weakness (generalized): Secondary | ICD-10-CM | POA: Insufficient documentation

## 2018-07-30 DIAGNOSIS — R293 Abnormal posture: Secondary | ICD-10-CM | POA: Diagnosis present

## 2018-07-30 DIAGNOSIS — M542 Cervicalgia: Secondary | ICD-10-CM | POA: Diagnosis not present

## 2018-07-30 DIAGNOSIS — M62838 Other muscle spasm: Secondary | ICD-10-CM | POA: Diagnosis present

## 2018-07-30 NOTE — Therapy (Signed)
[facility] Outpatient Rehabilitation [facility] [address] [place], [place], [address] Phone: [phone]   Fax:  [phone]  Physical Therapy Treatment  Patient Details  Name: Gail Blackburn MRN: [id number] Date of Birth: 1976/05/20 Referring Provider (PT): Naomie Dean, MD   Encounter Date: 07/30/2018  PT End of Session - 07/30/18 1505    Visit Number  17    Number of Visits  13    Date for PT Re-Evaluation  08/20/18    Authorization Type  MCD 8 visits 1/week through 12/7    Authorization - Visit Number  3    Authorization - Number of Visits  8    PT Start Time  1504    PT Stop Time  1600    PT Time Calculation (min)  56 min    Activity Tolerance  Patient tolerated treatment well    Behavior During Therapy  [facility] for tasks assessed/performed       Past Medical History:  Diagnosis Date  . Anemia    PRIOR HISTORY  . Anxiety   . Asthma   . Bipolar disorder (HCC)    Borderline  . Depression   . Fibromyalgia   . Headache    MIGRAINES  . Hypothyroidism 2009   TOOK MEDS FOR FEW WEEKS NO MEDS NOW  . Migraine   . Thyroid disease    treated in past not sure if too high or too low    Past Surgical History:  Procedure Laterality Date  . COLONOSCOPY    . DILATION AND EVACUATION N/A 05/06/2016   Procedure: DILATATION AND EVACUATION;  Surgeon: Huel Cote, MD;  Location: [facility];  Service: Gynecology;  Laterality: N/A;  . FOOT SURGERY    . TEETH PULLED  03/2016  . WISDOM TOOTH EXTRACTION      There were no vitals filed for this visit.  Subjective Assessment - 07/30/18 1505    Subjective  Feeling tense today in Rt upper trap. Did weights on eliptical. I had a bad HA last week on Thurs.     Patient Stated Goals  She wants to feel better    Currently in Pain?  Yes    Pain Location  Shoulder    Pain Orientation  Right    Pain Descriptors / Indicators  --   tension   Aggravating Factors   stress    Pain Relieving Factors  DN                        [facility] Adult PT Treatment/Exercise - 07/30/18 0001      Exercises   Exercises  Shoulder      Shoulder Exercises: Standing   Other Standing Exercises  1.25lb weights- OH press, fwd reach + hip hinge    Other Standing Exercises  box lift 15lb      Traction   Type of Traction  Cervical    Min (lbs)  5    Max (lbs)  17    Hold Time  60s    Rest Time  20s    Time  15 min      Manual Therapy   Manual therapy comments  skilled palpation and monitoring during TPDN    Joint Mobilization  Rt first rib mobs with breathing    Soft tissue mobilization  Rt scalenes, SCM       Trigger Point Dry Needling - 07/30/18 1546    Sternocleidomastoid Response  Twitch response elicited;Palpable increased muscle length  Scalenes Response  Twitch reponse elicited;Palpable increased muscle length             PT Short Term Goals - 05/07/18 1452      PT SHORT TERM GOAL #1   Title  Patient will be indepdnent with HEP     Baseline  understands and is able to demo    Status  Achieved      PT SHORT TERM GOAL #2   Title  Neck pain decr 15-20%     Baseline  about 7/10 today, reports decrease 30%    Status  Achieved        PT Long Term Goals - 06/25/18 1508      PT LONG TERM GOAL #1   Title  She will be independent with all hEP issued    Baseline  independent with exercises at this time but would benefit from further progression    Time  8    Period  Weeks    Status  On-going    Target Date  08/20/18      PT LONG TERM GOAL #2   Title  she will report neck pain improved 50% or more    Baseline  40% improved    Time  8    Period  Weeks    Status  On-going    Target Date  08/20/18      PT LONG TERM GOAL #3   Title  cervical sidebend to LT incr to 45 degrees    Baseline  32    Time  8    Period  Weeks    Status  On-going    Target Date  08/20/18      PT LONG TERM GOAL #4   Title  She will demo good posture and able to cervically extend  without upper cervical ext initiating movement    Baseline  able to demonstrate proper postural alignment at rest for short periods, cues to remind pt over time    Status  Achieved            Plan - 07/30/18 1926    Clinical Impression Statement  TPDN to Rt scalenes and SCM today with decreased tension noted. Exercises focused on lifting/reaching with some weight. Pt had significant difficulty noticing what mucle groups were working but was able after multiple repetitions.    PT Treatment/Interventions  Dry needling;Patient/family education;Therapeutic exercise;Taping;Manual techniques;Ultrasound;Moist Heat;ADLs/Self Care Home Management;Cryotherapy;Electrical Stimulation;Traction;Passive range of motion    PT Next Visit Plan  objective measures, increase weight in lifting-review weight lifting program at home     PT Home Exercise Plan  scap retraction, cervical retraction, door pec stretch, wall GHJ flx stretch, row, ext rot band; roller exercises; plank, child pose, SNAG    Consulted and Agree with Plan of Care  Patient       Patient will benefit from skilled therapeutic intervention in order to improve the following deficits and impairments:  Pain, Postural dysfunction, Decreased range of motion, Decreased activity tolerance, Increased muscle spasms  Visit Diagnosis: Cervicalgia  Other muscle spasm  Abnormal posture  Muscle weakness (generalized)     Problem List Patient Active Problem List   Diagnosis Date Noted  . Abdominal wall lump 02/21/2018  . Chronic fatigue syndrome with fibromyalgia 02/24/2017  . Skin lesion of left leg 02/10/2017  . History of abnormal cervical Pap smear 02/10/2017  . Pregnancy 04/12/2016  . Anxiety 10/15/2014  . Depression 10/15/2014  . Migraine 10/15/2014  . Chronic pain  10/15/2014  . Low TSH level 10/15/2014  . Tobacco use disorder 10/15/2014   Gail Blackburn C. Tashima Scarpulla PT, DPT 07/30/18 7:29 PM   [facility]  [facility] [address] [place], [place], [address] Phone: [phone]   Fax:  [phone]  Name: Gail Blackburn MRN: [id number] Date of Birth: [birth year]

## 2018-08-06 ENCOUNTER — Encounter: Payer: Medicaid Other | Admitting: Physical Therapy

## 2018-08-09 ENCOUNTER — Telehealth: Payer: Self-pay | Admitting: Neurology

## 2018-08-09 NOTE — Telephone Encounter (Signed)
Botox letter regarding [facility] mailed to patient °

## 2018-08-10 ENCOUNTER — Ambulatory Visit: Payer: Medicaid Other | Admitting: Physical Therapy

## 2018-08-13 ENCOUNTER — Ambulatory Visit: Payer: Medicaid Other | Admitting: Physical Therapy

## 2018-08-13 ENCOUNTER — Encounter: Payer: Self-pay | Admitting: Physical Therapy

## 2018-08-13 DIAGNOSIS — R293 Abnormal posture: Secondary | ICD-10-CM

## 2018-08-13 DIAGNOSIS — M62838 Other muscle spasm: Secondary | ICD-10-CM

## 2018-08-13 DIAGNOSIS — M6281 Muscle weakness (generalized): Secondary | ICD-10-CM

## 2018-08-13 DIAGNOSIS — M542 Cervicalgia: Secondary | ICD-10-CM | POA: Diagnosis not present

## 2018-08-13 NOTE — Therapy (Signed)
[facility] Outpatient Rehabilitation [facility] [address] [place], [place], [address] Phone: [phone]   Fax:  [phone]  Physical Therapy Treatment  Patient Details  Name: Gail Blackburn MRN: [id number] Date of Birth: 09/12/1976 Referring Provider (PT): Naomie Dean, MD   Encounter Date: 08/13/2018  PT End of Session - 08/13/18 1503    Visit Number  18    Date for PT Re-Evaluation  08/20/18    Authorization Type  MCD 8 visits 1/week through 12/7    Authorization - Visit Number  4    Authorization - Number of Visits  8    PT Start Time  1500    PT Stop Time  1550    PT Time Calculation (min)  50 min    Activity Tolerance  Patient tolerated treatment well    Behavior During Therapy  [facility] for tasks assessed/performed       Past Medical History:  Diagnosis Date  . Anemia    PRIOR HISTORY  . Anxiety   . Asthma   . Bipolar disorder (HCC)    Borderline  . Depression   . Fibromyalgia   . Headache    MIGRAINES  . Hypothyroidism 2009   TOOK MEDS FOR FEW WEEKS NO MEDS NOW  . Migraine   . Thyroid disease    treated in past not sure if too high or too low    Past Surgical History:  Procedure Laterality Date  . COLONOSCOPY    . DILATION AND EVACUATION N/A 05/06/2016   Procedure: DILATATION AND EVACUATION;  Surgeon: Huel Cote, MD;  Location: [facility];  Service: Gynecology;  Laterality: N/A;  . FOOT SURGERY    . TEETH PULLED  03/2016  . WISDOM TOOTH EXTRACTION      There were no vitals filed for this visit.  Subjective Assessment - 08/13/18 1503    Subjective  Right shoulder has been tight. Some days are better than others. Had tension HA last week. Neck tension comes on prior to HA    Patient Stated Goals  She wants to feel better    Currently in Pain?  Yes    Pain Location  Neck    Pain Orientation  Right    Pain Descriptors / Indicators  Tightness    Aggravating Factors   stress    Pain Relieving Factors  DN                        [facility] Adult PT Treatment/Exercise - 08/13/18 0001      Exercises   Exercises  Shoulder      Shoulder Exercises: ROM/Strengthening   UBE (Upper Arm Bike)  retro 3 min L1    Pec Fly Limitations  extension fly    Over Head Lace  overhead press down fwd in lunge    Other ROM/Strengthening Exercises  D1 ext with squat      Manual Therapy   Manual therapy comments  skilled palpation and monitoring during TPDN    Soft tissue mobilization  scalenes, suboccipitals, upper traps       Trigger Point Dry Needling - 08/13/18 1529    Muscles Treated Lower Body  --   suboccipitals bil   Scalenes Response  Twitch reponse elicited;Palpable increased muscle length   right   Upper Trapezius Response  Twitch reponse elicited;Palpable increased muscle length   bil          PT Education - 08/13/18 1551    Education  Details  exercise form/rationale, stress & msk pain    Person(s) Educated  Patient    Methods  Explanation;Demonstration;Tactile cues;Verbal cues;Handout    Comprehension  Verbalized understanding;Returned demonstration;Verbal cues required;Tactile cues required;Need further instruction       PT Short Term Goals - 05/07/18 1452      PT SHORT TERM GOAL #1   Title  Patient will be indepdnent with HEP     Baseline  understands and is able to demo    Status  Achieved      PT SHORT TERM GOAL #2   Title  Neck pain decr 15-20%     Baseline  about 7/10 today, reports decrease 30%    Status  Achieved        PT Long Term Goals - 06/25/18 1508      PT LONG TERM GOAL #1   Title  She will be independent with all hEP issued    Baseline  independent with exercises at this time but would benefit from further progression    Time  8    Period  Weeks    Status  On-going    Target Date  08/20/18      PT LONG TERM GOAL #2   Title  she will report neck pain improved 50% or more    Baseline  40% improved    Time  8    Period  Weeks    Status   On-going    Target Date  08/20/18      PT LONG TERM GOAL #3   Title  cervical sidebend to LT incr to 45 degrees    Baseline  32    Time  8    Period  Weeks    Status  On-going    Target Date  08/20/18      PT LONG TERM GOAL #4   Title  She will demo good posture and able to cervically extend without upper cervical ext initiating movement    Baseline  able to demonstrate proper postural alignment at rest for short periods, cues to remind pt over time    Status  Achieved            Plan - 08/13/18 1552    Clinical Impression Statement  Tension released in scalenes and suboccipitals today. decrease twitches noted in upper traps today. Added large range exercises with tbands, cues requried for form    PT Treatment/Interventions  Dry needling;Patient/family education;Therapeutic exercise;Taping;Manual techniques;Ultrasound;Moist Heat;ADLs/Self Care Home Management;Cryotherapy;Electrical Stimulation;Traction;Passive range of motion    PT Next Visit Plan  DN PRN, review collective HEP    PT Home Exercise Plan  scap retraction, cervical retraction, door pec stretch, wall GHJ flx stretch, row, ext rot band; roller exercises; plank, child pose, SNAG; D1 ext, ext fly, [facility]    Consulted and Agree with Plan of Care  Patient       Patient will benefit from skilled therapeutic intervention in order to improve the following deficits and impairments:  Pain, Postural dysfunction, Decreased range of motion, Decreased activity tolerance, Increased muscle spasms  Visit Diagnosis: Cervicalgia  Other muscle spasm  Abnormal posture  Muscle weakness (generalized)     Problem List Patient Active Problem List   Diagnosis Date Noted  . Abdominal wall lump 02/21/2018  . Chronic fatigue syndrome with fibromyalgia 02/24/2017  . Skin lesion of left leg 02/10/2017  . History of abnormal cervical Pap smear 02/10/2017  . Pregnancy 04/12/2016  . Anxiety  10/15/2014  . Depression  10/15/2014  . Migraine 10/15/2014  . Chronic pain 10/15/2014  . Low TSH level 10/15/2014  . Tobacco use disorder 10/15/2014   Shivaay Stormont C. Rosela Supak PT, DPT 08/13/18 3:56 PM   [facility] Outpatient Rehabilitation [facility] [address] [place], [place], [address] Phone: [phone]   Fax:  [phone]  Name: Gail Blackburn MRN: [id number] Date of Birth: August 28, 1976

## 2018-08-20 ENCOUNTER — Ambulatory Visit: Payer: Medicaid Other | Admitting: Physical Therapy

## 2018-08-20 ENCOUNTER — Encounter: Payer: Self-pay | Admitting: Physical Therapy

## 2018-08-20 DIAGNOSIS — R293 Abnormal posture: Secondary | ICD-10-CM

## 2018-08-20 DIAGNOSIS — M62838 Other muscle spasm: Secondary | ICD-10-CM

## 2018-08-20 DIAGNOSIS — M542 Cervicalgia: Secondary | ICD-10-CM

## 2018-08-20 DIAGNOSIS — M6281 Muscle weakness (generalized): Secondary | ICD-10-CM

## 2018-08-20 NOTE — Therapy (Signed)
[facility] Outpatient Rehabilitation [facility] [address] [place], [place], [address] Phone: [phone]   Fax:  [phone]  Physical Therapy Treatment  Patient Details  Name: Gail Blackburn MRN: [id number] Date of Birth: 1976/08/11 Referring Provider (PT): Naomie Dean, MD   Encounter Date: 08/20/2018  PT End of Session - 08/20/18 1504    Visit Number  19    Number of Visits  13    Date for PT Re-Evaluation  08/20/18    Authorization Type  MCD 8 visits 1/week through 12/7    Authorization - Visit Number  5    Authorization - Number of Visits  8    PT Start Time  1504    PT Stop Time  1545    PT Time Calculation (min)  41 min    Activity Tolerance  Patient tolerated treatment well    Behavior During Therapy  [facility] for tasks assessed/performed       Past Medical History:  Diagnosis Date  . Anemia    PRIOR HISTORY  . Anxiety   . Asthma   . Bipolar disorder (HCC)    Borderline  . Depression   . Fibromyalgia   . Headache    MIGRAINES  . Hypothyroidism 2009   TOOK MEDS FOR FEW WEEKS NO MEDS NOW  . Migraine   . Thyroid disease    treated in past not sure if too high or too low    Past Surgical History:  Procedure Laterality Date  . COLONOSCOPY    . DILATION AND EVACUATION N/A 05/06/2016   Procedure: DILATATION AND EVACUATION;  Surgeon: Huel Cote, MD;  Location: [facility];  Service: Gynecology;  Laterality: N/A;  . FOOT SURGERY    . TEETH PULLED  03/2016  . WISDOM TOOTH EXTRACTION      There were no vitals filed for this visit.  Subjective Assessment - 08/20/18 1505    Subjective  "    Currently in Pain?  Yes    Pain Score  7     Pain Location  Neck    Pain Orientation  Right    Pain Descriptors / Indicators  Tightness    Pain Onset  More than a month ago    Pain Frequency  Constant    Aggravating Factors   stress    Pain Relieving Factors  DN                       [facility] Adult PT Treatment/Exercise - 08/20/18 0001       Exercises   Exercises  Shoulder      Shoulder Exercises: Seated   Horizontal ABduction  Strengthening;15 reps;Theraband    Theraband Level (Shoulder Horizontal ABduction)  Level 4 (Blue)    Other Seated Exercises  scapular retraction green theraband 2 x 15    Other Seated Exercises  serratus punch with band around back 2 x 15 with green theraband      Manual Therapy   Manual therapy comments  skilled palpation and monitoring during TPDN    Joint Mobilization  Rt first rib mobs with breathing    Soft tissue mobilization  scalenes, suboccipitals, upper traps       Trigger Point Dry Needling - 08/20/18 1507    Consent Given?  Yes    Education Handout Provided  No    Muscles Treated Upper Body  Subscapularis    Scalenes Response  Twitch reponse elicited;Palpable increased muscle length    Upper  Trapezius Response  Twitch reponse elicited;Palpable increased muscle length    Levator Scapulae Response  Twitch response elicited;Palpable increased muscle length    Rhomboids Response  Twitch response elicited;Palpable increased muscle length    Subscapularis Response  Twitch response elicited;Palpable increased muscle length             PT Short Term Goals - 05/07/18 1452      PT SHORT TERM GOAL #1   Title  Patient will be indepdnent with HEP     Baseline  understands and is able to demo    Status  Achieved      PT SHORT TERM GOAL #2   Title  Neck pain decr 15-20%     Baseline  about 7/10 today, reports decrease 30%    Status  Achieved        PT Long Term Goals - 06/25/18 1508      PT LONG TERM GOAL #1   Title  She will be independent with all hEP issued    Baseline  independent with exercises at this time but would benefit from further progression    Time  8    Period  Weeks    Status  On-going    Target Date  08/20/18      PT LONG TERM GOAL #2   Title  she will report neck pain improved 50% or more    Baseline  40% improved    Time  8    Period  Weeks     Status  On-going    Target Date  08/20/18      PT LONG TERM GOAL #3   Title  cervical sidebend to LT incr to 45 degrees    Baseline  32    Time  8    Period  Weeks    Status  On-going    Target Date  08/20/18      PT LONG TERM GOAL #4   Title  She will demo good posture and able to cervically extend without upper cervical ext initiating movement    Baseline  able to demonstrate proper postural alignment at rest for short periods, cues to remind pt over time    Status  Achieved            Plan - 08/20/18 1720    Clinical Impression Statement  pt reports increased muscle soreness today which she attributes to Stress. continued TPDN for R peri-scapular muscles and scalenes which she noted soreness but improvement of pain. reviewed some exercises today. plan to see pt for one more visit so continue reviewing HEP and any other manual techniques and potential .      PT Treatment/Interventions  Dry needling;Patient/family education;Therapeutic exercise;Taping;Manual techniques;Ultrasound;Moist Heat;ADLs/Self Care Home Management;Cryotherapy;Electrical Stimulation;Traction;Passive range of motion    PT Next Visit Plan  DN PRN, review collective HEP    PT Home Exercise Plan  scap retraction, cervical retraction, door pec stretch, wall GHJ flx stretch, row, ext rot band; roller exercises; plank, child pose, SNAG; D1 ext, ext fly, [facility]    Consulted and Agree with Plan of Care  Patient       Patient will benefit from skilled therapeutic intervention in order to improve the following deficits and impairments:  Pain, Postural dysfunction, Decreased range of motion, Decreased activity tolerance, Increased muscle spasms  Visit Diagnosis: Cervicalgia  Other muscle spasm  Abnormal posture  Muscle weakness (generalized)     Problem List Patient Active Problem  List   Diagnosis Date Noted  . Abdominal wall lump 02/21/2018  . Chronic fatigue syndrome with fibromyalgia  02/24/2017  . Skin lesion of left leg 02/10/2017  . History of abnormal cervical Pap smear 02/10/2017  . Pregnancy 04/12/2016  . Anxiety 10/15/2014  . Depression 10/15/2014  . Migraine 10/15/2014  . Chronic pain 10/15/2014  . Low TSH level 10/15/2014  . Tobacco use disorder 10/15/2014   Lulu Riding PT, DPT, LAT, ATC  08/20/18  5:25 PM      [facility]patient Rehabilitation [facility] [address] [place], [place], [address] Phone: [phone]   Fax:  [phone]  Name: Gail Blackburn MRN: [id number] Date of Birth: 1976/04/17

## 2018-08-22 ENCOUNTER — Ambulatory Visit: Payer: Medicaid Other | Admitting: Physical Therapy

## 2018-08-22 ENCOUNTER — Encounter: Payer: Self-pay | Admitting: Physical Therapy

## 2018-08-22 DIAGNOSIS — M542 Cervicalgia: Secondary | ICD-10-CM | POA: Diagnosis not present

## 2018-08-22 DIAGNOSIS — R293 Abnormal posture: Secondary | ICD-10-CM

## 2018-08-22 DIAGNOSIS — M6281 Muscle weakness (generalized): Secondary | ICD-10-CM

## 2018-08-22 DIAGNOSIS — M62838 Other muscle spasm: Secondary | ICD-10-CM

## 2018-08-22 NOTE — Therapy (Signed)
[place], [place], [address] Phone: [phone]   Fax:  [phone]  Physical Therapy Treatment / Discharge summary  Patient Details  Name: Gail Blackburn MRN: [id number] Date of Birth: 1975/10/29 Referring Provider (PT): Sarina Ill, MD   Encounter Date: 08/22/2018  PT End of Session - 08/22/18 1325    Visit Number  20    Number of Visits  20    Date for PT Re-Evaluation  08/22/18    Authorization Type  MCD 8 visits 1/week through 12/7    Authorization - Visit Number  6    Authorization - Number of Visits  8    PT Start Time  8768    PT Stop Time  1415    PT Time Calculation (min)  49 min    Activity Tolerance  Patient tolerated treatment well    Behavior During Therapy  [facility] for tasks assessed/performed       Past Medical History:  Diagnosis Date  . Anemia    PRIOR HISTORY  . Anxiety   . Asthma   . Bipolar disorder ([place])    Borderline  . Depression   . Fibromyalgia   . Headache    MIGRAINES  . Hypothyroidism 2009   TOOK MEDS FOR FEW WEEKS NO MEDS NOW  . Migraine   . Thyroid disease    treated in past not sure if too high or too low    Past Surgical History:  Procedure Laterality Date  . COLONOSCOPY    . DILATION AND EVACUATION N/A 05/06/2016   Procedure: DILATATION AND EVACUATION;  Surgeon: Paula Compton, MD;  Location: [facility];  Service: Gynecology;  Laterality: N/A;  . FOOT SURGERY    . TEETH PULLED  03/2016  . WISDOM TOOTH EXTRACTION      There were no vitals filed for this visit.  Subjective Assessment - 08/22/18 1327    Subjective  "I am feeling more soreness inthe back of the head, which I think is related to stress and the holiday"    Patient Stated Goals  She wants to feel better    Currently in Pain?  Yes    Pain Score  10-Worst pain ever    Pain Location  Neck    Pain Orientation  Right    Pain Descriptors / Indicators  Tightness    Pain Type  Chronic pain    Pain Onset  More than a month ago    Pain Frequency  Constant         [facility] PT Assessment - 08/22/18 0001      Assessment   Medical Diagnosis  Cervicalgia    Referring Provider (PT)  Sarina Ill, MD      AROM   Cervical Flexion  32    Cervical Extension  43    Cervical - Right Side Bend  30    Cervical - Left Side Bend  38    Cervical - Right Rotation  66    Cervical - Left Rotation  58                   [facility] Adult PT Treatment/Exercise - 08/22/18 0001      Self-Care   Self-Care  Other Self-Care Comments    Other Self-Care Comments   reviewed sub-ocipital release and how to perform at home.      Exercises   Exercises  Shoulder;Neck      Neck Exercises: Seated  Neck Retraction  10 reps;5 secs   cues for proper form   Other Seated Exercise  SNAGS 1 x 5 bil holding end range x 5 secs      Shoulder Exercises: Seated   Other Seated Exercises  scapular retraction green theraband 2 x 15      Shoulder Exercises: Prone   Retraction  Strengthening;10 reps      Shoulder Exercises: Standing   Row  15 reps    Theraband Level (Shoulder Row)  Level 3 (Green)    Other Standing Exercises  lower trap wall y's 2 x 10       Manual Therapy   Manual therapy comments  skilled palpation and monitoring during TPDN    Joint Mobilization  T1-T8 PA grade 3, bil rib mobs grade 3    Soft tissue mobilization  scalenes, suboccipitals, upper traps       Trigger Point Dry Needling - 08/22/18 1340    Consent Given?  Yes    Education Handout Provided  No    SubOccipitals Response  Twitch response elicited;Palpable increased muscle length           PT Education - 08/22/18 1501    Education Details  reviewed previously provided HEP and how to progress strengthening. Discussed as function improves pain beings to improve and to focus on function.     Person(s) Educated  Patient    Methods  Explanation;Verbal cues;Handout    Comprehension  Verbalized understanding;Verbal  cues required       PT Short Term Goals - 05/07/18 1452      PT SHORT TERM GOAL #1   Title  Patient will be indepdnent with HEP     Baseline  understands and is able to demo    Status  Achieved      PT SHORT TERM GOAL #2   Title  Neck pain decr 15-20%     Baseline  about 7/10 today, reports decrease 30%    Status  Achieved        PT Long Term Goals - 08/22/18 1505      PT LONG TERM GOAL #1   Title  She will be independent with all hEP issued    Time  8    Period  Weeks    Status  Achieved      PT LONG TERM GOAL #2   Title  she will report neck pain improved 50% or more    Time  8    Period  Weeks    Status  Not Met      PT LONG TERM GOAL #3   Title  cervical sidebend to LT incr to 45 degrees    Time  8    Period  Weeks    Status  Not Met      PT LONG TERM GOAL #4   Title  She will demo good posture and able to cervically extend without upper cervical ext initiating movement    Time  6    Period  Weeks    Status  Achieved            Plan - 08/22/18 1502    Clinical Impression Statement  pt reports increased pain inthe R side of the neck reporting 10/10 pain today but refused going to the ED and exhibited of visual signs or guarding that would coincide with a high level of pain. continued TPDN focused on sub-occipitals followed with IASTM and rib and thoracic mobs.  reviewed/ updated HEP, she met or partially met all goals except for LTG #2 &3 today. dicsussed with pt returning to MD due continued pain and limited functional progression which she reported understanding. pt is able to maintain their current level of function and will be discharged today.     PT Treatment/Interventions  Dry needling;Patient/family education;Therapeutic exercise;Taping;Manual techniques;Ultrasound;Moist Heat;ADLs/Self Care Home Management;Cryotherapy;Electrical Stimulation;Traction;Passive range of motion    PT Next Visit Plan  D/C    PT Home Exercise Plan  scap retraction, cervical  retraction, door pec stretch, wall GHJ flx stretch, row, ext rot band; roller exercises; plank, child pose, SNAG; D1 ext, ext fly, press down from [place] and Agree with Plan of Care  Patient       Patient will benefit from skilled therapeutic intervention in order to improve the following deficits and impairments:  Pain, Postural dysfunction, Decreased range of motion, Decreased activity tolerance, Increased muscle spasms  Visit Diagnosis: Cervicalgia  Other muscle spasm  Abnormal posture  Muscle weakness (generalized)     Problem List Patient Active Problem List   Diagnosis Date Noted  . Abdominal wall lump 02/21/2018  . Chronic fatigue syndrome with fibromyalgia 02/24/2017  . Skin lesion of left leg 02/10/2017  . History of abnormal cervical Pap smear 02/10/2017  . Pregnancy 04/12/2016  . Anxiety 10/15/2014  . Depression 10/15/2014  . Migraine 10/15/2014  . Chronic pain 10/15/2014  . Low TSH level 10/15/2014  . Tobacco use disorder 10/15/2014    [place] 08/22/2018, 3:09 PM  [facility] [address] [place], [place], [address] Phone: [phone]   Fax:  [phone]  Name: Gail Blackburn MRN: [id number] Date of Birth: August 06, 1976       PHYSICAL THERAPY DISCHARGE SUMMARY  Visits from Start of Care: 20  Current functional level related to goals / functional outcomes: See goal   Remaining deficits: Fluctuating pain in the neck, cerivcal ROM limitation in all planes due to pain and stiffness per pt report. See assessment above.    Education / Equipment: HEP, theraband, posture  Plan: Patient agrees to discharge.  Patient goals were partially met. Patient is being discharged due to lack of progress.  ?????         Mahrosh Donnell PT, DPT, LAT, ATC  08/22/18  3:11 PM

## 2018-09-10 DIAGNOSIS — Z0289 Encounter for other administrative examinations: Secondary | ICD-10-CM

## 2018-09-14 ENCOUNTER — Ambulatory Visit (INDEPENDENT_AMBULATORY_CARE_PROVIDER_SITE_OTHER): Payer: Medicaid Other | Admitting: Neurology

## 2018-09-14 DIAGNOSIS — M7918 Myalgia, other site: Secondary | ICD-10-CM | POA: Diagnosis not present

## 2018-09-14 DIAGNOSIS — G43711 Chronic migraine without aura, intractable, with status migrainosus: Secondary | ICD-10-CM

## 2018-09-14 MED ORDER — CYCLOBENZAPRINE HCL 10 MG PO TABS: 10.0000 mg | ORAL_TABLET | Freq: Three times a day (TID) | ORAL | 3 refills | Status: DC | PRN

## 2018-09-14 MED ORDER — GALCANEZUMAB-GNLM 120 MG/ML ~~LOC~~ SOAJ: 240.0000 mg | SUBCUTANEOUS | 0 refills | Status: DC

## 2018-09-14 MED ORDER — GALCANEZUMAB-GNLM 120 MG/ML ~~LOC~~ SOAJ: 120.0000 mg | SUBCUTANEOUS | 11 refills | Status: DC

## 2018-09-14 NOTE — Addendum Note (Signed)
Addended by: Naomie DeanAHERN, Jerrianne Hartin B on: 09/14/2018 09:53 AM   Modules accepted: Orders, Level of Service

## 2018-09-14 NOTE — Progress Notes (Signed)
Botox- 100 units x 2 vials Lot: [id number] Expiration: 06/2020 NDC: [id number]  Bacteriostatic 0.9% Sodium Chloride- 4mL total Lot: [id number] Expiration: 06/27/2019 NDC: [id number]  Dx: W41.324G43.711 S/P

## 2018-09-14 NOTE — Patient Instructions (Addendum)
Stop [employer]. Watch for sedation and do not take with any other sedating meds or pain medication.  Acupuncture and flexeril for neck pain Email [place] when you want a referral back for dry needling  Galcanezumab: Patient drug information Access [employer] Online here. Copyright [phone] [employer] All rights reserved. (For additional information see "Galcanezumab: Drug information") Brand Names: [place]  Emgality;  Emgality (300 MG Dose)  Brand Names: [place]  Emgality  What is this drug used for?   It is used to prevent migraine headaches.   It is used to treat cluster headaches.  What do I need to tell my doctor BEFORE I take this drug?   If you are allergic to this drug; any part of this drug; or any other drugs, foods, or substances. Tell your doctor about the allergy and what signs you had.   This drug may interact with other drugs or health problems.   Tell your doctor and pharmacist about all of your drugs (prescription or OTC, natural products, vitamins) and health problems. You must check to make sure that it is safe for you to take this drug with all of your drugs and health problems. Do not start, stop, or change the dose of any drug without checking with your doctor.  What are some things I need to know or do while I take this drug?   Tell all of your health care providers that you take this drug. This includes your doctors, nurses, pharmacists, and dentists.   Do not share pen or cartridge devices with another person even if the needle has been changed. Sharing these devices may pass infections from one person to another. This includes infections you may not know you have.   Tell your doctor if you are pregnant, plan on getting pregnant, or are breast-feeding. You will need to talk about the benefits and risks to you and the baby.  What are some side effects that I need to call my doctor about right away?   WARNING/CAUTION: Even though it may be rare,  some people may have very bad and sometimes deadly side effects when taking a drug. Tell your doctor or get medical help right away if you have any of the following signs or symptoms that may be related to a very bad side effect:   Signs of an allergic reaction, like rash; hives; itching; red, swollen, blistered, or peeling skin with or without fever; wheezing; tightness in the chest or throat; trouble breathing, swallowing, or talking; unusual hoarseness; or swelling of the mouth, face, lips, tongue, or throat.  What are some other side effects of this drug?   All drugs may cause side effects. However, many people have no side effects or only have minor side effects. Call your doctor or get medical help if any of these side effects or any other side effects bother you or do not go away:   Irritation where the shot is given.   These are not all of the side effects that may occur. If you have questions about side effects, call your doctor. Call your doctor for medical advice about side effects.   You may report side effects to your national health agency.  How is this drug best taken?   Use this drug as ordered by your doctor. Read all information given to you. Follow all instructions closely.   It is given as a shot into the fatty part of the skin in the upper arm, thigh, buttocks,  or stomach area.   If you will be giving yourself the shot, your doctor or nurse will teach you how to give the shot.   Wash your hands before use.   If stored in a refrigerator, let this drug come to room temperature before using it. Leave it at room temperature for at least 30 minutes. Do not heat this drug.   Do not shake.   Do not give into skin that is irritated, bruised, red, infected, or scarred.   Do not give into skin within 2 inches of the belly button.   If your dose is more than 1 injection, you may give it in the same body part. Make sure you do not give injections in the same spot you used for the other  injections.   Do not rub the site where you give the shot.   Do not use if the solution is cloudy, leaking, or has particles.   This drug is colorless to slightly yellow or slightly brown. Do not use if the solution changes color.   Move the site where you give the shot with each shot.   If you drop this drug on a hard surface, do not use it.   Each prefilled pen or syringe is for one use only.   Throw away needles in a needle/sharp disposal box. Do not reuse needles or other items. When the box is full, follow all local rules for getting rid of it. Talk with a doctor or pharmacist if you have any questions.  What do I do if I miss a dose?   Take a missed dose as soon as you think about it.   After taking a missed dose, start a new schedule based on when the dose is taken.  How do I store and/or throw out this drug?   Store in a refrigerator. Do not freeze.   Store in the original container to protect from light.   If needed, you may store at room temperature for up to 7 days. Write down the date you take this drug out of the refrigerator. If stored at room temperature and not used within 7 days, throw this drug away.   Do not put this drug back in the refrigerator after it has been stored at room temperature.   Keep all drugs in a safe place. Keep all drugs out of the reach of children and pets.   Throw away unused or expired drugs. Do not flush down a toilet or pour down a drain unless you are told to do so. Check with your pharmacist if you have questions about the best way to throw out drugs. There may be drug take-back programs in your area.  General drug facts   If your symptoms or health problems do not get better or if they become worse, call your doctor.   Do not share your drugs with others and do not take anyone else's drugs.   Some drugs may have another patient information leaflet. If you have any questions about this drug, please talk with your doctor, nurse, pharmacist, or  other health care provider.   If you think there has been an overdose, call your poison control center or get medical care right away. Be ready to tell or show what was taken, how much, and when it happened.    Cyclobenzaprine tablets What is this medicine? CYCLOBENZAPRINE (sye kloe BEN za preen) is a muscle relaxer. It is used to treat muscle pain, spasms,  and stiffness. This medicine may be used for other purposes; ask your health care provider or pharmacist if you have questions. COMMON BRAND NAME(S): Fexmid, Flexeril What should I tell my health care provider before I take this medicine? They need to know if you have any of these conditions: -heart disease, irregular heartbeat, or previous heart attack -liver disease -thyroid problem -an unusual or allergic reaction to cyclobenzaprine, tricyclic antidepressants, lactose, other medicines, foods, dyes, or preservatives -pregnant or trying to get pregnant -breast-feeding How should I use this medicine? Take this medicine by mouth with a glass of water. Follow the directions on the prescription label. If this medicine upsets your stomach, take it with food or milk. Take your medicine at regular intervals. Do not take it more often than directed. Talk to your pediatrician regarding the use of this medicine in children. Special care may be needed. Overdosage: If you think you have taken too much of this medicine contact a poison control center or emergency room at once. NOTE: This medicine is only for you. Do not share this medicine with others. What if I miss a dose? If you miss a dose, take it as soon as you can. If it is almost time for your next dose, take only that dose. Do not take double or extra doses. What may interact with this medicine? Do not take this medicine with any of the following medications: -MAOIs like Carbex, Eldepryl, Marplan, Nardil, and Parnate This medicine may also interact with the following  medications: -alcohol -antihistamines for allergy, cough, and cold -certain medicines for anxiety or sleep -certain medicines for depression like amitriptyline, fluoxetine, sertraline -certain medicines for seizures like phenobarbital, primidone -contrast dyes -local anesthetics like lidocaine, pramoxine, tetracaine -medicines that relax muscles for surgery -narcotic medicines for pain -phenothiazines like chlorpromazine, mesoridazine, prochlorperazine This list may not describe all possible interactions. Give your health care provider a list of all the medicines, herbs, non-prescription drugs, or dietary supplements you use. Also tell them if you smoke, drink alcohol, or use illegal drugs. Some items may interact with your medicine. What should I watch for while using this medicine? Tell your doctor or health care professional if your symptoms do not start to get better or if they get worse. You may get drowsy or dizzy. Do not drive, use machinery, or do anything that needs mental alertness until you know how this medicine affects you. Do not stand or sit up quickly, especially if you are an older patient. This reduces the risk of dizzy or fainting spells. Alcohol may interfere with the effect of this medicine. Avoid alcoholic drinks. If you are taking another medicine that also causes drowsiness, you may have more side effects. Give your health care provider a list of all medicines you use. Your doctor will tell you how much medicine to take. Do not take more medicine than directed. Call emergency for help if you have problems breathing or unusual sleepiness. Your mouth may get dry. Chewing sugarless gum or sucking hard candy, and drinking plenty of water may help. Contact your doctor if the problem does not go away or is severe. What side effects may I notice from receiving this medicine? Side effects that you should report to your doctor or health care professional as soon as  possible: -allergic reactions like skin rash, itching or hives, swelling of the face, lips, or tongue -breathing problems -chest pain -fast, irregular heartbeat -hallucinations -seizures -unusually weak or tired Side effects that usually do not require  medical attention (report to your doctor or health care professional if they continue or are bothersome): -headache -nausea, vomiting This list may not describe all possible side effects. Call your doctor for medical advice about side effects. You may report side effects to FDA at 1-800-FDA-1088. Where should I keep my medicine? Keep out of the reach of children. Store at room temperature between 15 and 30 degrees C (59 and 86 degrees F). Keep container tightly closed. Throw away any unused medicine after the expiration date. NOTE: This sheet is a summary. It may not cover all possible information. If you have questions about this medicine, talk to your doctor, pharmacist, or health care provider.  2019 Elsevier/Gold Standard (2017-07-05 13:04:35)

## 2018-09-14 NOTE — Progress Notes (Signed)
Patient has lots of cervical myofascial pain. She feels it is pinching her muscle. Worse on movement. Discussed. No weakness. Strength is 5/5. No sensory changes on exam. Very tight muscles. Discussed myofascial pain, stretching, strengthening.  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 Try flexeril, stop tizanidine watch for sedation. Try acupuncture. She will call back when she wants to be referred to dry needling again.  Meds ordered this encounter  Medications  . Galcanezumab-gnlm (EMGALITY) 120 MG/ML SOAJ    Sig: Inject 120 mg into the skin every 30 (thirty) days.    Dispense:  1 pen    Refill:  11  . Galcanezumab-gnlm (EMGALITY) 120 MG/ML SOAJ    Sig: Inject 240 mg into the skin every 30 (thirty) days.    Dispense:  2 pen    Refill:  0  . cyclobenzaprine (FLEXERIL) 10 MG tablet    Sig: Take 1 tablet (10 mg total) by mouth 3 (three) times daily as needed for muscle spasms.    Dispense:  90 tablet    Refill:  3   A total of 25 minutes was spent face-to-face with this patient. Over half this time was spent on counseling patient on the myofascial pain syndrome diagnosis and different diagnostic and therapeutic options, counseling and coordination of care, risks ans benefits of management, compliance, or risk factor reduction and education.  This does not include time spent on botox procedure.

## 2018-09-14 NOTE — Progress Notes (Addendum)
Consent Form Botulism Toxin Injection For Chronic Migraine  Interval history 09/14/2018: This is our 4th botox.  Significantly improved, >50% reduction in migraine frequency. She now has 15 headache days a month and 7 are migrainous. +levator scapulae bilat,masseters, temples and oo. Baseline is daily headaches and 15 migraine days a month. Will switch to emgality, botox working tremendously but ajovy may not be working will try [employer].   Last appointment 05/2018: Please perform dry needling on cervical muscles for cervical myofascial pain. She loved dry needling. She will be going back, she will let us know when she wants a referral. Will refer for acupuncture at [facility].    Reviewed orally with patient, additionally signature is on file:  Botulism toxin has been approved by the Federal drug administration for treatment of chronic migraine. Botulism toxin does not cure chronic migraine and it may not be effective in some patients.  The administration of botulism toxin is accomplished by injecting a small amount of toxin into the muscles of the neck and head. Dosage must be titrated for each individual. Any benefits resulting from botulism toxin tend to wear off after 3 months with a repeat injection required if benefit is to be maintained. Injections are usually done every 3-4 months with maximum effect peak achieved by about 2 or 3 weeks. Botulism toxin is expensive and you should be sure of what costs you will incur resulting from the injection.  The side effects of botulism toxin use for chronic migraine may include:   -Transient, and usually mild, facial weakness with facial injections  -Transient, and usually mild, head or neck weakness with head/neck injections  -Reduction or loss of forehead facial animation due to forehead muscle weakness  -Eyelid drooping  -Dry eye  -Pain at the site of injection or bruising at the site of injection  -Double vision  -Potential unknown long  term risks  Contraindications: You should not have Botox if you are pregnant, nursing, allergic to albumin, have an infection, skin condition, or muscle weakness at the site of the injection, or have myasthenia gravis, Lambert-Eaton syndrome, or ALS.  It is also possible that as with any injection, there may be an allergic reaction or no effect from the medication. Reduced effectiveness after repeated injections is sometimes seen and rarely infection at the injection site may occur. All care will be taken to prevent these side effects. If therapy is given over a long time, atrophy and wasting in the muscle injected may occur. Occasionally the patient's become refractory to treatment because they develop antibodies to the toxin. In this event, therapy needs to be modified.  I have read the above information and consent to the administration of botulism toxin.    BOTOX PROCEDURE NOTE FOR MIGRAINE HEADACHE    Contraindications and precautions discussed with patient(above). Aseptic procedure was observed and patient tolerated procedure. Procedure performed by Dr. Artemio Alyoni Sharmaine Bain  The condition has existed for more than 6 months, and pt does not have a diagnosis of ALS, Myasthenia Gravis or Lambert-Eaton Syndrome.  Risks and benefits of injections discussed and pt agrees to proceed with the procedure.  Written consent obtained  These injections are medically necessary. Pt  receives good benefits from these injections. These injections do not cause sedations or hallucinations which the oral therapies may cause.  Description of procedure:  The patient was placed in a sitting position. The standard protocol was used for Botox as follows, with 5 units of Botox injected at each site:   -  Procerus muscle, midline injection  -Corrugator muscle, bilateral injection  -Frontalis muscle, bilateral injection, with 2 sites each side, medial injection was performed in the upper one third of the frontalis muscle,  in the region vertical from the medial inferior edge of the superior orbital rim. The lateral injection was again in the upper one third of the forehead vertically above the lateral limbus of the cornea, 1.5 cm lateral to the medial injection site.  - Levator Scapulae: 5 units bilaterally  -Temporalis muscle injection, 5 sites, bilaterally. The first injection was 3 cm above the tragus of the ear, second injection site was 1.5 cm to 3 cm up from the first injection site in line with the tragus of the ear. The third injection site was 1.5-3 cm forward between the first 2 injection sites. The fourth injection site was 1.5 cm posterior to the second injection site. 5th site laterally in the temporalis  muscleat the level of the outer canthus.  - Patient feels her clenching is a trigger for headaches. +5 units masseter bilaterally   - Patient feels the migraines are centered around the eyes +5 units bilaterally at the outer canthus in the orbicularis occuli  -Occipitalis muscle injection, 3 sites, bilaterally. The first injection was done one half way between the occipital protuberance and the tip of the mastoid process behind the ear. The second injection site was done lateral and superior to the first, 1 fingerbreadth from the first injection. The third injection site was 1 fingerbreadth superiorly and medially from the first injection site.  -Cervical paraspinal muscle injection, 2 sites, bilateral knee first injection site was 1 cm from the midline of the cervical spine, 3 cm inferior to the lower border of the occipital protuberance. The second injection site was 1.5 cm superiorly and laterally to the first injection site.  -Trapezius muscle injection was performed at 3 sites, bilaterally. The first injection site was in the upper trapezius muscle halfway between the inflection point of the neck, and the acromion. The second injection site was one half way between the acromion and the first injection  site. The third injection was done between the first injection site and the inflection point of the neck.   Will return for repeat injection in 3 months.   A 200 unit sof Botox was used, any Botox not injected was wasted. The patient tolerated the procedure well, there were no complications of the above procedure.

## 2018-10-01 ENCOUNTER — Other Ambulatory Visit: Payer: Self-pay | Admitting: Neurology

## 2018-10-04 ENCOUNTER — Telehealth: Payer: Self-pay | Admitting: Neurology

## 2018-10-04 ENCOUNTER — Other Ambulatory Visit: Payer: Self-pay | Admitting: *Deleted

## 2018-10-04 MED ORDER — METHYLPREDNISOLONE 4 MG PO TBPK: ORAL_TABLET | ORAL | 0 refills | Status: DC

## 2018-10-04 NOTE — Telephone Encounter (Signed)
I called the patient after I spoke with Dr. Lucia GaskinsAhern. I advised patient per Dr. Lucia GaskinsAhern that this could be a migraine however if she has an eye doctor we would suggest to see them first. I also offered an appt with Dr. Lucia GaskinsAhern on 10/12/18 at 10:00 AM. Pt took the appt and stated she would call her eye doctor tomorrow and she will call us with an update after she is seen. I also offered a steroid dose pack per Dr.Ahern. Pt verbalized appreciation, denied allergies to methylprednisolone or prednisone. I advised pt that she can take each day's pills in the mornings each day with food. She verbalized appreciation and understanding. She will also cancel the appt with Dr. Lucia GaskinsAhern if it's not needed.   Medrol dose pack sent to [facility] on [address] refills 0.

## 2018-10-04 NOTE — Telephone Encounter (Signed)
Pt states she is experiencing a headache for 4 days that is causing pressure to her eyes "like if someone is pushing down on her eyeballs." Pt would like to know if she should be seen here or see an eye doctor. Please advise.

## 2018-10-10 ENCOUNTER — Telehealth: Payer: Self-pay | Admitting: *Deleted

## 2018-10-10 NOTE — Telephone Encounter (Signed)
Patient is scheduled for an appointment this Friday but wonders if she still needs it.  States the pain in her eye is better.  Please call

## 2018-10-10 NOTE — Telephone Encounter (Signed)
Please call and cancel appointment thanks!

## 2018-10-11 NOTE — Telephone Encounter (Signed)
I called the patient and left a message (ok per DPR) letting her know the 10/12/2018 appt has been canceled.  Reminded her of the pending appt in March.  Provided our number to call back, if needed.

## 2018-10-12 ENCOUNTER — Ambulatory Visit: Payer: Self-pay | Admitting: Neurology

## 2018-10-31 ENCOUNTER — Other Ambulatory Visit: Payer: Self-pay

## 2018-10-31 ENCOUNTER — Ambulatory Visit: Payer: Medicaid Other | Attending: Urology | Admitting: Physical Therapy

## 2018-10-31 DIAGNOSIS — M6281 Muscle weakness (generalized): Secondary | ICD-10-CM | POA: Insufficient documentation

## 2018-10-31 DIAGNOSIS — R252 Cramp and spasm: Secondary | ICD-10-CM | POA: Insufficient documentation

## 2018-10-31 DIAGNOSIS — R279 Unspecified lack of coordination: Secondary | ICD-10-CM | POA: Diagnosis present

## 2018-10-31 NOTE — Therapy (Signed)
[facility] [address] [place], [place], [address] Phone: [phone]   Fax:  [phone]  Physical Therapy Evaluation  Patient Details  Name: Gail Blackburn MRN: [id number] Date of Birth: 1975/12/16 Referring Provider (PT): Crista Elliot, MD   Encounter Date: 10/31/2018  PT End of Session - 10/31/18 1552    Visit Number  1    Date for PT Re-Evaluation  01/23/19    Authorization Type  MCD    PT Start Time  1446    PT Stop Time  1525    PT Time Calculation (min)  39 min    Activity Tolerance  Patient tolerated treatment well    Behavior During Therapy  [facility] for tasks assessed/performed       Past Medical History:  Diagnosis Date  . Anemia    PRIOR HISTORY  . Anxiety   . Asthma   . Bipolar disorder (HCC)    Borderline  . Depression   . Fibromyalgia   . Headache    MIGRAINES  . Hypothyroidism 2009   TOOK MEDS FOR FEW WEEKS NO MEDS NOW  . Migraine   . Thyroid disease    treated in past not sure if too high or too low    Past Surgical History:  Procedure Laterality Date  . COLONOSCOPY    . DILATION AND EVACUATION N/A 05/06/2016   Procedure: DILATATION AND EVACUATION;  Surgeon: Huel Cote, MD;  Location: [facility];  Service: Gynecology;  Laterality: N/A;  . FOOT SURGERY    . TEETH PULLED  03/2016  . WISDOM TOOTH EXTRACTION      There were no vitals filed for this visit.   Subjective Assessment - 10/31/18 1449    Subjective  Pt states she has had inability to urinate for about one year.  She has to push very hard and then when she stands up a little urine comes out.  I am getting pain in the chest when I push hard to pee and have gotten hemorrhoids from pushing.      Pertinent History  chonic fatigue    Patient Stated Goals  able to pee without pushing         [facility] PT Assessment - 10/31/18 0001      Assessment   Medical Diagnosis  R35.0 (ICD-10-CM) - Urinary frequency; N39.3 (ICD-10-CM)  - Stress incontinence    Referring Provider (PT)  Crista Elliot, MD    Onset Date/Surgical Date  --   1 year ago   Prior Therapy  Not pelvic floor      Precautions   Precautions  None      Restrictions   Weight Bearing Restrictions  No      Balance Screen   Has the patient fallen in the past 6 months  No      Home Environment   Living Environment  Private residence    Living Arrangements  Parent   parents for the time     Prior Function   Level of Independence  Independent    Leisure  hip classes and zumba - 1x/ week       Cognition   Overall Cognitive Status  Within Functional Limits for tasks assessed      Posture/Postural Control   Posture/Postural Control  Postural limitations    Postural Limitations  Rounded Shoulders      ROM / Strength   AROM / PROM / Strength  AROM  AROM   Overall AROM Comments  lumbar full ROM except Rt SB at 80%      Flexibility   Soft Tissue Assessment /Muscle Length  yes    Hamstrings  80%      Palpation   Palpation comment  tender and fascial restrictions along ascending and transverse colon      Ambulation/Gait   Gait Pattern  Decreased stride length   guarded               Objective measurements completed on examination: See above findings.    Pelvic Floor Special Questions - 10/31/18 0001    Prior Pelvic/Prostate Exam  Yes    Are you Pregnant or attempting pregnancy?  No    Prior Pregnancies  Yes    Number of Pregnancies  3    Number of Vaginal Deliveries  2    Episiotomy Performed  Yes    Currently Sexually Active  Yes    Is this Painful  Yes    Marinoff Scale  pain interrupts completion    Urinary Leakage  Yes    How often  after urinating small amount    Pad use  no    Activities that cause leaking  Other    Urinary urgency  No    Fecal incontinence  No   constipation   Falling out feeling (prolapse)  No    External Perineal Exam  weak lift observed    Skin Integrity  Intact    Perineal  Body/Introitus   Elevated    Prolapse  None    Pelvic Floor Internal Exam  pt identity confirmed and informed consent was given    Exam Type  Vaginal    Palpation  tight coccygeus and bulbocavernosis    Strength  weak squeeze, no lift    Strength # of seconds  2    Tone  high       [facility] Adult PT Treatment/Exercise - 10/31/18 0001      Self-Care   Self-Care  Other Self-Care Comments    Other Self-Care Comments   toileting, abdominal massage, self massage to bulbocavernosis                  PT Long Term Goals - 10/31/18 1553      PT LONG TERM GOAL #1   Title  ind with HEP for management of symptoms    Baseline  does not know, just learned some self care today    Time  12    Period  Weeks    Status  New    Target Date  01/23/19      PT LONG TERM GOAL #2   Title  able to empty bladder without any leakage upon standing    Baseline  she reports a few drops every time    Time  12    Period  Weeks    Status  New    Target Date  01/23/19      PT LONG TERM GOAL #3   Title  able to contract pelvic floor with 3/5 MMT and hold for 5 seconds in order to demonstrate improved muscle control for toileting function    Baseline  2/5, hold for 2 seconds    Time  12    Period  Weeks    Status  New    Target Date  01/23/19      PT LONG TERM GOAL #4   Title  reports she is  able to urinate without pushing    Baseline  reports maximum bearing down to empty bladder    Time  12    Period  Weeks    Status  New    Target Date  01/23/19             Plan - 10/31/18 1542    Clinical Impression Statement  Pt presents to clinic due to dysuria. She is pushing with significant force to urinate.  Pt is also having difficulty having a BM.  She has high tone pelvic floor with tension of bulbocavernosis and coccygeus.  Pt has limited flexibility of hamstrings.  She has decreased lumbar SB to right.  Pt has weakness of 2/5 MMT of pelvic floor.  She has fascial restrictions in abdomen.   Pt will benefit from skilled PT to learn HEP for management of her symptoms address muscle coordination so she can correctly contract and relax pelvic floor for ability to urinate without excessive force.    History and Personal Factors relevant to plan of care:  vaginal delivery with tearing leaving scar tissue, chronic fatigue, polypharmicy    Clinical Presentation  Stable    Clinical Presentation due to:  pt is stable    Clinical Decision Making  Moderate    Rehab Potential  Good    PT Frequency  1x / week    PT Duration  12 weeks    PT Treatment/Interventions  ADLs/Self Care Home Management;Biofeedback;Therapeutic exercise;Therapeutic activities;Neuromuscular re-education;Patient/family education;Scar mobilization;Passive range of motion;Dry needling;Manual techniques;Taping    PT Next Visit Plan  abdominal fascia release, internal STM, breathing and stretches    PT Home Exercise Plan  self care toileting and ab massage    Consulted and Agree with Plan of Care  Patient       Patient will benefit from skilled therapeutic intervention in order to improve the following deficits and impairments:  Increased muscle spasms, Impaired tone, Decreased strength, Increased fascial restricitons, Impaired flexibility, Decreased coordination  Visit Diagnosis: Cramp and spasm  Muscle weakness (generalized)  Unspecified lack of coordination     Problem List Patient Active Problem List   Diagnosis Date Noted  . Abdominal wall lump 02/21/2018  . Chronic fatigue syndrome with fibromyalgia 02/24/2017  . Skin lesion of left leg 02/10/2017  . History of abnormal cervical Pap smear 02/10/2017  . Pregnancy 04/12/2016  . Anxiety 10/15/2014  . Depression 10/15/2014  . Migraine 10/15/2014  . Chronic pain 10/15/2014  . Low TSH level 10/15/2014  . Tobacco use disorder 10/15/2014    Vincente Poli, PT 10/31/2018, 3:58 PM  [facility] [address] [place], [place], [address] Phone: [phone]   Fax:  [phone]  Name: Aariya Overgaard MRN: [id number] Date of Birth: [birth year]

## 2018-10-31 NOTE — Patient Instructions (Incomplete)
Toileting Techniques for Bowel Movements (Defecation) Using your belly (abdomen) and pelvic floor muscles to have a bowel movement is usually instinctive.  Sometimes people can have problems with these muscles and have to relearn proper defecation (emptying) techniques.  If you have weakness in your muscles, organs that are falling out, decreased sensation in your pelvis, or ignore your urge to go, you may find yourself straining to have a bowel movement.  You are straining if you are: . holding your breath or taking in a huge gulp of air and holding it  . keeping your lips and jaw tensed and closed tightly . turning red in the face because of excessive pushing or forcing . developing or worsening your  hemorrhoids . getting faint while pushing . not emptying completely and have to defecate many times a day  If you are straining, you are actually making it harder for yourself to have a bowel movement.  Many people find they are pulling up with the pelvic floor muscles and closing off instead of opening the anus. Due to lack pelvic floor relaxation and coordination the abdominal muscles, one has to work harder to push the feces out.  Many people have never been taught how to defecate efficiently and effectively.  Notice what happens to your body when you are having a bowel movement.  While you are sitting on the toilet pay attention to the following areas: . Jaw and mouth position . Angle of your hips   . Whether your feet touch the ground or not . Arm placement  . Spine position . Waist . Belly tension . Anus (opening of the anal canal)  An Evacuation/Defecation Plan   Here are the 4 basic points:  1. Lean forward enough for your elbows to rest on your knees 2. Support your feet on the floor or use a low stool if your feet don't touch the floor  3. Push out your belly as if you have swallowed a beach ball-you should feel a widening of your waist 4. Open and relax your pelvic floor muscles,  rather than tightening around the anus      The following conditions my require modifications to your toileting posture:  . If you have had surgery in the past that limits your back, hip, pelvic, knee or ankle flexibility . Constipation   Your healthcare practitioner may make the following additional suggestions and adjustments:  1) Sit on the toilet  a) Make sure your feet are supported. b) Notice your hip angle and spine position-most people find it effective to lean forward or raise their knees, which can help the muscles around the anus to relax  c) When you lean forward, place your forearms on your thighs for support  2) Relax suggestions a) Breath deeply in through your nose and out slowly through your mouth as if you are smelling the flowers and blowing out the candles. b) To become aware of how to relax your muscles, contracting and releasing muscles can be helpful.  Pull your pelvic floor muscles in tightly by using the image of holding back gas, or closing around the anus (visualize making a circle smaller) and lifting the anus up and in.  Then release the muscles and your anus should drop down and feel open. Repeat 5 times ending with the feeling of relaxation. c) Keep your pelvic floor muscles relaxed; let your belly bulge out. d) The digestive tract starts at the mouth and ends at the anal opening, so be   sure to relax both ends of the tube.  Place your tongue on the roof of your mouth with your teeth separated.  This helps relax your mouth and will help to relax the anus at the same time.  3) Empty (defecation) a) Keep your pelvic floor and sphincter relaxed, then bulge your anal muscles.  Make the anal opening wide.  b) Stick your belly out as if you have swallowed a beach ball. c) Make your belly wall hard using your belly muscles while continuing to breathe. Doing this makes it easier to open your anus. d) Breath out and give a grunt (or try using other sounds such as  ahhhh, shhhhh, ohhhh or grrrrrrr).  4) Finish a) As you finish your bowel movement, pull the pelvic floor muscles up and in.  This will leave your anus in the proper place rather than remaining pushed out and down. If you leave your anus pushed out and down, it will start to feel as though that is normal and give you incorrect signals about needing to have a bowel movement.    [facility] [address] [place], [place] [address] This meditation is from [facility], physical therapist and yoga instructor.  Patients, yoga students and pelvic health practitioners that I have shared this with have seemed to really enjoy it, find it easy to practice and teach, and report its usefulness! It involves 6 stages, using the acronym "AIRBAG" to help you remember! If you are sitting on the toilet, it is a good idea to place your feet up on some blocks so that your knees are slightly higher than your hips. This position can help enhance your PFMs to relax and allow for proper elimination, particularly for bowel movements. If you are standing during urination, these toilet meditation stages of "AIRBAG" can still be performed: A = Awareness: become present and connected to your body as best as you can. A brief body scan from head to toe simply observing sensations that you may be experiencing, without judgement, both internally (interoceptive awareness) and externally (such as the sensations at the soles of the feet weight bearing on the supporting surface, or sensations at the thighs as they weight bear on the toilet seat, or how you are holding your arms, or any tension in the jaw or shoulders). You may include awareness of thoughts and emotions, without elaborating on a story or analyzing. I = Imagination: use your visualization skills to imagine your pelvic floor and the general area of attachments of the PFMs to the inside of the front, sides and back of the pelvis, the tailbone  and sacrum. Visualize where the bladder and bowel are positioned and imagine them emptying and that the PFMs spanning across the pelvic floor are healthy and functioning optimally. R = Release & Relax: let go of any tension in the PFMs as best as you can. Releasing and relaxing these muscles can sometimes be difficult for a variety of reasons. Sometimes 'trying too hard' to relax and let go creates even more tension. Be patient and compassionate towards yourself if you have trouble with this. Letting go often takes courage, trust, concentration and practise. B = Breathe: allow your natural breath pattern to emerge. Sometimes when we try to breathe, we create more tension that results in unnatural patterns that do not serve a relaxed state. As you quietly inhale, the belly will naturally protrude outwards or forward and the pelvic floor will descend. As you exhale, the  belly and PFMs will return to their resting positions. During toileting, see if you can simply allow the quiet rhythm of the abdomino-pelvic diaphragmatic breath to happen on its own without trying to change it. A = Allow: this 'allowing' stage is a little more than just releasing and relaxing or allowing the breath to happen on its own. See if you can really give yourself permission to trust that your body knows what to do and when to do it. Perhaps you feel the need to push gently (do not strain) or you feel like you want to take a deep breath, sigh out loud, lean forward, or place your feet in a different position. The more refined your awareness skills are, the more you can trust what feels right, and not always what you think you should do. G = Gratitude: I think it is a healthy practice to not only be completely present and mindful when toileting, but to also honour this sophisticated and truly complex function that our body does for [place] on a daily basis without [place] even asking it to. So each time you complete your toileting event, I invite you  to send a little gratitude to your body and all its incredibly phenomenal parts to end your toilet meditation ! There is a FREE bonus feature of the full guided Toilet Meditation (with music and breathtaking cinematography) included along with the "Creating Pelvic Floor Health with PhysioYoga" practice sessions which consist of a combination of physical therapy based exercises and yoga methods targeted to optimize pelvic floor health. I also have a free brief segment of the meditation on my YouTube channel.   About Abdominal Massage  Abdominal massage, also called external colon massage, is a self-treatment circular massage technique that can reduce and eliminate gas and ease constipation. The colon naturally contracts in waves in a clockwise direction starting from inside the right hip, moving up toward the ribs, across the belly, and down inside the left hip.  When you perform circular abdominal massage, you help stimulate your colon's normal wave pattern of movement called peristalsis.  It is most beneficial when done after eating.  Positioning You can practice abdominal massage with oil while lying down, or in the shower with soap.  Some people find that it is just as effective to do the massage through clothing while sitting or standing.  How to Massage Start by placing your finger tips or knuckles on your right side, just inside your hip bone.  . Make small circular movements while you move upward toward your rib cage.   . Once you reach the bottom right side of your rib cage, take your circular movements across to the left side of the bottom of your rib cage.  . Next, move downward until you reach the inside of your left hip bone.  This is the path your feces travel in your colon. . Continue to perform your abdominal massage in this pattern for 10 minutes each day.     You can apply as much pressure as is comfortable in your massage.  Start gently and build pressure as you continue to  practice.  Notice any areas of pain as you massage; areas of slight pain may be relieved as you massage, but if you have areas of significant or intense pain, consult with your healthcare provider.  Other Considerations . General physical activity including bending and stretching can have a beneficial massage-like effect on the colon.  Deep breathing can also stimulate the colon  because breathing deeply activates the same nervous system that supplies the colon.   . Abdominal massage should always be used in combination with a bowel-conscious diet that is high in the proper type of fiber for you, fluids (primarily water), and a regular exercise program.

## 2018-11-06 ENCOUNTER — Telehealth: Payer: Self-pay | Admitting: Neurology

## 2018-11-06 DIAGNOSIS — M7918 Myalgia, other site: Secondary | ICD-10-CM

## 2018-11-06 NOTE — Telephone Encounter (Signed)
Pt states she is needing a new referral for [facility] for dry needling. Please advise.

## 2018-11-06 NOTE — Telephone Encounter (Signed)
What attached order? thanks

## 2018-11-07 ENCOUNTER — Ambulatory Visit: Payer: Medicaid Other | Admitting: Physical Therapy

## 2018-11-07 DIAGNOSIS — R252 Cramp and spasm: Secondary | ICD-10-CM | POA: Diagnosis not present

## 2018-11-07 DIAGNOSIS — M6281 Muscle weakness (generalized): Secondary | ICD-10-CM

## 2018-11-07 NOTE — Therapy (Signed)
[facility] [address] [place], [place], [address] Phone: [phone]   Fax:  [phone]  Physical Therapy Treatment  Patient Details  Name: Gail Blackburn MRN: [id number] Date of Birth: 1976/05/25 Referring Provider (PT): Crista Elliot, MD   Encounter Date: 11/07/2018  PT End of Session - 11/07/18 0844    Visit Number  2    Date for PT Re-Evaluation  01/23/19    Authorization Type  MCD    Authorization - Visit Number  1    Authorization - Number of Visits  3    PT Start Time  0802    PT Stop Time  0843    PT Time Calculation (min)  41 min    Activity Tolerance  Patient tolerated treatment well    Behavior During Therapy  [facility] for tasks assessed/performed       Past Medical History:  Diagnosis Date  . Anemia    PRIOR HISTORY  . Anxiety   . Asthma   . Bipolar disorder (HCC)    Borderline  . Depression   . Fibromyalgia   . Headache    MIGRAINES  . Hypothyroidism 2009   TOOK MEDS FOR FEW WEEKS NO MEDS NOW  . Migraine   . Thyroid disease    treated in past not sure if too high or too low    Past Surgical History:  Procedure Laterality Date  . COLONOSCOPY    . DILATION AND EVACUATION N/A 05/06/2016   Procedure: DILATATION AND EVACUATION;  Surgeon: Huel Cote, MD;  Location: [facility];  Service: Gynecology;  Laterality: N/A;  . FOOT SURGERY    . TEETH PULLED  03/2016  . WISDOM TOOTH EXTRACTION      There were no vitals filed for this visit.  Subjective Assessment - 11/07/18 0804    Subjective  Nothing has changed.  I forgot to do the self massage.    Pertinent History  chonic fatigue    Patient Stated Goals  able to pee without pushing    Currently in Pain?  No/denies                    Pelvic Floor Special Questions - 11/07/18 0001    Biofeedback Activity  Quick contraction;10 second hold   5 sec hold; all in sitting and        [facility] Adult PT Treatment/Exercise -  11/07/18 0001      Neuro Re-ed    Neuro Re-ed Details   biofeedback: resting tone 2.44mV, quick flick 29mV, 10 sec hold 5.7mV, 20 sec 6.58mV, resting after test       Manual Therapy   Manual Therapy  Myofascial release    Myofascial Release  bladder fascial release                  PT Long Term Goals - 10/31/18 1553      PT LONG TERM GOAL #1   Title  ind with HEP for management of symptoms    Baseline  does not know, just learned some self care today    Time  12    Period  Weeks    Status  New    Target Date  01/23/19      PT LONG TERM GOAL #2   Title  able to empty bladder without any leakage upon standing    Baseline  she reports a few drops every time    Time  12    Period  Weeks    Status  New    Target Date  01/23/19      PT LONG TERM GOAL #3   Title  able to contract pelvic floor with 3/5 MMT and hold for 5 seconds in order to demonstrate improved muscle control for toileting function    Baseline  2/5, hold for 2 seconds    Time  12    Period  Weeks    Status  New    Target Date  01/23/19      PT LONG TERM GOAL #4   Title  reports she is able to urinate without pushing    Baseline  reports maximum bearing down to empty bladder    Time  12    Period  Weeks    Status  New    Target Date  01/23/19            Plan - 11/07/18 0845    Clinical Impression Statement  Pt did well with biofeedback and demonstrates higher resting tone needing cues to relax.  She was able to get 3 or less after doing exercises.  Pt did all activities in sitting to work on position for urinating.    PT Treatment/Interventions  ADLs/Self Care Home Management;Biofeedback;Therapeutic exercise;Therapeutic activities;Neuromuscular re-education;Patient/family education;Scar mobilization;Passive range of motion;Dry needling;Manual techniques;Taping    PT Next Visit Plan  biofeedback for control, internal STM    PT Home Exercise Plan  self care toileting and ab massage, self massage  and contract and hold    Consulted and Agree with Plan of Care  Patient       Patient will benefit from skilled therapeutic intervention in order to improve the following deficits and impairments:  Increased muscle spasms, Impaired tone, Decreased strength, Increased fascial restricitons, Impaired flexibility, Decreased coordination  Visit Diagnosis: Cramp and spasm  Muscle weakness (generalized)     Problem List Patient Active Problem List   Diagnosis Date Noted  . Abdominal wall lump 02/21/2018  . Chronic fatigue syndrome with fibromyalgia 02/24/2017  . Skin lesion of left leg 02/10/2017  . History of abnormal cervical Pap smear 02/10/2017  . Pregnancy 04/12/2016  . Anxiety 10/15/2014  . Depression 10/15/2014  . Migraine 10/15/2014  . Chronic pain 10/15/2014  . Low TSH level 10/15/2014  . Tobacco use disorder 10/15/2014    Vincente Poli, PT 11/07/2018, 9:19 AM  [facility] [address], [address] [place], [place], [address] Phone: [phone]   Fax:  [phone]  Name: Murrel Pavloff MRN: [id number] Date of Birth: 05-26-1976

## 2018-11-12 ENCOUNTER — Encounter: Payer: Medicaid Other | Admitting: Physical Therapy

## 2018-11-14 DIAGNOSIS — Z0289 Encounter for other administrative examinations: Secondary | ICD-10-CM

## 2018-11-20 ENCOUNTER — Ambulatory Visit: Payer: Medicaid Other | Attending: Neurology | Admitting: Physical Therapy

## 2018-11-20 ENCOUNTER — Encounter: Payer: Self-pay | Admitting: Physical Therapy

## 2018-11-20 DIAGNOSIS — R293 Abnormal posture: Secondary | ICD-10-CM | POA: Diagnosis present

## 2018-11-20 DIAGNOSIS — R279 Unspecified lack of coordination: Secondary | ICD-10-CM | POA: Diagnosis present

## 2018-11-20 DIAGNOSIS — M6281 Muscle weakness (generalized): Secondary | ICD-10-CM | POA: Insufficient documentation

## 2018-11-20 DIAGNOSIS — M542 Cervicalgia: Secondary | ICD-10-CM | POA: Diagnosis not present

## 2018-11-20 DIAGNOSIS — M62838 Other muscle spasm: Secondary | ICD-10-CM | POA: Insufficient documentation

## 2018-11-20 DIAGNOSIS — R252 Cramp and spasm: Secondary | ICD-10-CM

## 2018-11-20 NOTE — Therapy (Signed)
[facility] Outpatient Rehabilitation [facility] [address] [place], [place], [address] Phone: [phone]   Fax:  [phone]  Physical Therapy Treatment/Ortho re-evaluation  Patient Details  Name: Gail Blackburn MRN: [id number] Date of Birth: 07-05-1976 Referring Provider (PT): Crista Elliot, MD; Naomie Dean, MD   Encounter Date: 11/20/2018  PT End of Session - 11/20/18 2152    Visit Number  3    Number of Visits  20    Date for PT Re-Evaluation  01/23/19    Authorization Type  MCD    Authorization - Visit Number  2    Authorization - Number of Visits  3    PT Start Time  1415    PT Stop Time  1457    PT Time Calculation (min)  42 min    Activity Tolerance  Patient tolerated treatment well    Behavior During Therapy  [facility] for tasks assessed/performed       Past Medical History:  Diagnosis Date  . Anemia    PRIOR HISTORY  . Anxiety   . Asthma   . Bipolar disorder (HCC)    Borderline  . Depression   . Fibromyalgia   . Headache    MIGRAINES  . Hypothyroidism 2009   TOOK MEDS FOR FEW WEEKS NO MEDS NOW  . Migraine   . Thyroid disease    treated in past not sure if too high or too low    Past Surgical History:  Procedure Laterality Date  . COLONOSCOPY    . DILATION AND EVACUATION N/A 05/06/2016   Procedure: DILATATION AND EVACUATION;  Surgeon: Huel Cote, MD;  Location: [facility];  Service: Gynecology;  Laterality: N/A;  . FOOT SURGERY    . TEETH PULLED  03/2016  . WISDOM TOOTH EXTRACTION      There were no vitals filed for this visit.  Subjective Assessment - 11/20/18 1421    Subjective  Feeling tension from stress- has gove from 10 to 100, moved to mom and dads house. has noticed an increase in migraines. have been doing exercises and stretching. noticing migraines in bil cervical region and more in Left eye. trying to get back to chiropractor.     Pertinent History  chonic fatigue, stress     Diagnostic tests  MRI last year :  negative    Patient Stated Goals  able to pee without pushing    Currently in Pain?  Yes    Pain Score  7     Pain Location  Neck    Pain Orientation  Right;Left    Pain Descriptors / Indicators  Tightness    Aggravating Factors   stress    Pain Relieving Factors  DN, chiropractor         [facility] PT Assessment - 11/20/18 0001      Assessment   Medical Diagnosis  R35.0 (ICD-10-CM) - Urinary frequency; N39.3 (ICD-10-CM) - Stress incontinence; cervicalgia    Referring Provider (PT)  Crista Elliot, MD; Naomie Dean, MD    Prior Therapy  last year, currently in pelvic floor rehab      Precautions   Precautions  None      Restrictions   Weight Bearing Restrictions  No      Balance Screen   Has the patient fallen in the past 6 months  No      Home Environment   Living Arrangements  --   currently living with parents     Prior Function  Level of Independence  Independent    Vocation Requirements  not working      Copy Status  Within Functional Limits for tasks assessed      Sensation   Additional Comments  occasional tingling bil first 3 digits, R & L do not happen together      Posture/Postural Control   Posture Comments  forward head, rounded shoulders      AROM   Cervical Flexion  12   pulling   Cervical Extension  22    Cervical - Right Side Bend  16    Cervical - Left Side Bend  18    Cervical - Right Rotation  42    Cervical - Left Rotation  42      Strength   Overall Strength Comments  gross UE strength 5/5    Strength Assessment Site  Hand    Right/Left hand  Right;Left    Right Hand Grip (lbs)  55    Left Hand Grip (lbs)  50                   [facility] Adult PT Treatment/Exercise - 11/20/18 0001      Manual Therapy   Manual Therapy  Taping    Manual therapy comments  skilled palpation and monitoring during TPDN    Soft tissue mobilization  bilat pectoralis    Kinesiotex  Facilitate Muscle      Kinesiotix    Facilitate Muscle   scapular retraction       Trigger Point Dry Needling - 11/20/18 1446    Consent Given?  Yes    Education Handout Provided  --   verbal education   Muscles Treated Upper Body  Pectoralis minor;Pectoralis major    Pectoralis Major Response  Twitch response elicited;Palpable increased muscle length   bil   Pectoralis Minor Response  Twitch response elicited;Palpable increased muscle length   bil          PT Education - 11/20/18 2152    Education Details  POC, HEP    Person(s) Educated  Patient    Methods  Explanation    Comprehension  Verbalized understanding;Need further instruction       PT Short Term Goals - 05/07/18 1452      PT SHORT TERM GOAL #1   Title  Patient will be indepdnent with HEP     Baseline  understands and is able to demo    Status  Achieved      PT SHORT TERM GOAL #2   Title  Neck pain decr 15-20%     Baseline  about 7/10 today, reports decrease 30%    Status  Achieved        PT Long Term Goals - 11/20/18 2158      PT LONG TERM GOAL #1   Title  ind with HEP for management of symptoms    Baseline  does not know, just learned some self care today    Time  12    Period  Weeks    Status  New    Target Date  01/23/19      PT LONG TERM GOAL #2   Title  able to empty bladder without any leakage upon standing    Baseline  she reports a few drops every time    Time  12    Period  Weeks    Status  New    Target Date  01/23/19  PT LONG TERM GOAL #3   Title  able to contract pelvic floor with 3/5 MMT and hold for 5 seconds in order to demonstrate improved muscle control for toileting function    Baseline  2/5, hold for 2 seconds    Time  12    Period  Weeks    Status  New    Target Date  01/23/19      PT LONG TERM GOAL #4   Title  reports she is able to urinate without pushing    Baseline  reports maximum bearing down to empty bladder    Time  12    Period  Weeks    Status  New    Target Date  01/23/19      PT  LONG TERM GOAL #5   Title  Pt will report cervical pain <= 2/10 with daily activities    Baseline  7/10 at eval    Time  6    Period  Weeks    Status  New    Target Date  01/04/19      Additional Long Term Goals   Additional Long Term Goals  Yes      PT LONG TERM GOAL #6   Title  Pt will be independent with long term HEP and postural alignment for continued care    Baseline  will progress and establish as appropriate    Time  6    Period  Weeks    Status  New    Target Date  01/04/19            Plan - 11/20/18 2153    Clinical Impression Statement  Pt presents to orthopedic PT with complaints of chronic neck pain and HA. Feels that primary cause of pain is stress causing tension. Good strength bilat periscap regions with good grip strength. Poor postural alignment contributing to biomechanical chain tension.  Pt complains of dizziness when standing from chairs. Pt will continue to be treated for pelvic floor as well as treatment for cervical pain once per week.     History and Personal Factors relevant to plan of care:  vaginal delivery with tearing leaving scar tissue, chronic fatigue, polypharmacy, high stress levels- home environment    PT Frequency  1x / week    PT Duration  12 weeks   1/week 6 weeks for Ortho   PT Treatment/Interventions  ADLs/Self Care Home Management;Biofeedback;Therapeutic exercise;Therapeutic activities;Neuromuscular re-education;Patient/family education;Scar mobilization;Passive range of motion;Dry needling;Manual techniques;Taping;Cryotherapy;Electrical Stimulation;Moist Heat;Traction;Ultrasound    PT Next Visit Plan  biofeedback for control, internal STM; DN scalenes, abdominal activation in standing for postural support    PT Home Exercise Plan  self care toileting and ab massage, self massage and contract and hold; door pec stretch, continue band exercises from last session    Consulted and Agree with Plan of Care  Patient       Patient will  benefit from skilled therapeutic intervention in order to improve the following deficits and impairments:  Increased muscle spasms, Impaired tone, Decreased strength, Increased fascial restricitons, Impaired flexibility, Decreased coordination, Pain, Improper body mechanics, Postural dysfunction, Decreased activity tolerance, Decreased range of motion  Visit Diagnosis: Cervicalgia - Plan: PT plan of care cert/re-cert  Abnormal posture - Plan: PT plan of care cert/re-cert  Unspecified lack of coordination - Plan: PT plan of care cert/re-cert  Muscle weakness (generalized) - Plan: PT plan of care cert/re-cert  Cramp and spasm - Plan: PT plan of care cert/re-cert  Other  muscle spasm - Plan: PT plan of care cert/re-cert     Problem List Patient Active Problem List   Diagnosis Date Noted  . Abdominal wall lump 02/21/2018  . Chronic fatigue syndrome with fibromyalgia 02/24/2017  . Skin lesion of left leg 02/10/2017  . History of abnormal cervical Pap smear 02/10/2017  . Pregnancy 04/12/2016  . Anxiety 10/15/2014  . Depression 10/15/2014  . Migraine 10/15/2014  . Chronic pain 10/15/2014  . Low TSH level 10/15/2014  . Tobacco use disorder 10/15/2014  Vergene Marland C. Zamzam Whinery PT, DPT 11/20/18 10:05 PM   [facility] Outpatient Rehabilitation [facility] [address] [place], [place], [address] Phone: [phone]   Fax:  [phone]  Name: Ludell Manross MRN: [id number] Date of Birth: 1976/04/13

## 2018-11-21 ENCOUNTER — Ambulatory Visit: Payer: Medicaid Other | Admitting: Physical Therapy

## 2018-11-21 ENCOUNTER — Telehealth: Payer: Self-pay | Admitting: Physical Therapy

## 2018-11-21 NOTE — Telephone Encounter (Signed)
Patient did not show for appointment.  Patient was called and PT left message to please call us back.  Vincente Poli, PT 11/21/18 8:29 AM

## 2018-11-28 ENCOUNTER — Ambulatory Visit: Payer: Medicaid Other | Attending: Neurology | Admitting: Physical Therapy

## 2018-11-28 ENCOUNTER — Encounter: Payer: Self-pay | Admitting: Physical Therapy

## 2018-11-28 DIAGNOSIS — M6281 Muscle weakness (generalized): Secondary | ICD-10-CM | POA: Insufficient documentation

## 2018-11-28 DIAGNOSIS — R279 Unspecified lack of coordination: Secondary | ICD-10-CM

## 2018-11-28 DIAGNOSIS — R293 Abnormal posture: Secondary | ICD-10-CM | POA: Insufficient documentation

## 2018-11-28 DIAGNOSIS — R252 Cramp and spasm: Secondary | ICD-10-CM | POA: Insufficient documentation

## 2018-11-28 DIAGNOSIS — M542 Cervicalgia: Secondary | ICD-10-CM | POA: Insufficient documentation

## 2018-11-28 NOTE — Therapy (Signed)
[facility] Outpatient Rehabilitation [facility] [address] [place], [place], [address] Phone: [phone]   Fax:  [phone]  Physical Therapy Treatment  Patient Details  Name: Gail Blackburn MRN: [id number] Date of Birth: 1976-07-21 Referring Provider (PT): Crista Elliot, MD; Naomie Dean, MD   Encounter Date: 11/28/2018  PT End of Session - 11/28/18 1440    PT Start Time  1416    PT Stop Time  1430    PT Time Calculation (min)  14 min       Past Medical History:  Diagnosis Date  . Anemia    PRIOR HISTORY  . Anxiety   . Asthma   . Bipolar disorder (HCC)    Borderline  . Depression   . Fibromyalgia   . Headache    MIGRAINES  . Hypothyroidism 2009   TOOK MEDS FOR FEW WEEKS NO MEDS NOW  . Migraine   . Thyroid disease    treated in past not sure if too high or too low    Past Surgical History:  Procedure Laterality Date  . COLONOSCOPY    . DILATION AND EVACUATION N/A 05/06/2016   Procedure: DILATATION AND EVACUATION;  Surgeon: Huel Cote, MD;  Location: [facility];  Service: Gynecology;  Laterality: N/A;  . FOOT SURGERY    . TEETH PULLED  03/2016  . WISDOM TOOTH EXTRACTION      There were no vitals filed for this visit.  Subjective Assessment - 11/28/18 1418    Subjective  Pain in neck and shoulders . Soon she will start meds at night.  No sure details. I can't see the Chiropractor with the  pain I am in now    Currently in Pain?  Yes    Pain Score  10-Worst pain ever    Pain Location  Neck    Pain Orientation  Right;Left    Pain Descriptors / Indicators  Tightness;Aching;Headache;Pounding;Throbbing    Pain Radiating Towards  upper traps rt >  LT    Pain Frequency  Constant    Aggravating Factors   turning,,  touching,  woke up that way,  backing up to drive.      Pain Relieving Factors  trigger point release  DN   Chiropractor,,    Effect of Pain on Daily Activities  sleeping difficult,   ADL  difficult ,  unable to exercise                        [facility] Adult PT Treatment/Exercise - 11/28/18 0001      Neck Exercises: Supine   Other Supine Exercise  --               PT Short Term Goals - 05/07/18 1452      PT SHORT TERM GOAL #1   Title  Patient will be indepdnent with HEP     Baseline  understands and is able to demo    Status  Achieved      PT SHORT TERM GOAL #2   Title  Neck pain decr 15-20%     Baseline  about 7/10 today, reports decrease 30%    Status  Achieved        PT Long Term Goals - 11/20/18 2158      PT LONG TERM GOAL #1   Title  ind with HEP for management of symptoms    Baseline  does not know, just learned some self care today  Time  12    Period  Weeks    Status  New    Target Date  01/23/19      PT LONG TERM GOAL #2   Title  able to empty bladder without any leakage upon standing    Baseline  she reports a few drops every time    Time  12    Period  Weeks    Status  New    Target Date  01/23/19      PT LONG TERM GOAL #3   Title  able to contract pelvic floor with 3/5 MMT and hold for 5 seconds in order to demonstrate improved muscle control for toileting function    Baseline  2/5, hold for 2 seconds    Time  12    Period  Weeks    Status  New    Target Date  01/23/19      PT LONG TERM GOAL #4   Title  reports she is able to urinate without pushing    Baseline  reports maximum bearing down to empty bladder    Time  12    Period  Weeks    Status  New    Target Date  01/23/19      PT LONG TERM GOAL #5   Title  Pt will report cervical pain <= 2/10 with daily activities    Baseline  7/10 at eval    Time  6    Period  Weeks    Status  New    Target Date  01/04/19      Additional Long Term Goals   Additional Long Term Goals  Yes      PT LONG TERM GOAL #6   Title  Pt will be independent with long term HEP and postural alignment for continued care    Baseline  will progress and establish as appropriate    Time  6    Period  Weeks     Status  New    Target Date  01/04/19            Plan - 11/28/18 1442    Clinical Impression Statement  Patient arrived but decided to not be seen due to lack of medicaid authorization.      PT Next Visit Plan  biofeedback for control, internal STM; DN scalenes, abdominal activation in standing for postural support    PT Home Exercise Plan  self care toileting and ab massage, self massage and contract and hold; door pec stretch, continue band exercises from last session    Consulted and Agree with Plan of Care  Patient       Patient will benefit from skilled therapeutic intervention in order to improve the following deficits and impairments:     Visit Diagnosis: Cervicalgia  Abnormal posture  Unspecified lack of coordination  Muscle weakness (generalized)  Cramp and spasm     Problem List Patient Active Problem List   Diagnosis Date Noted  . Abdominal wall lump 02/21/2018  . Chronic fatigue syndrome with fibromyalgia 02/24/2017  . Skin lesion of left leg 02/10/2017  . History of abnormal cervical Pap smear 02/10/2017  . Pregnancy 04/12/2016  . Anxiety 10/15/2014  . Depression 10/15/2014  . Migraine 10/15/2014  . Chronic pain 10/15/2014  . Low TSH level 10/15/2014  . Tobacco use disorder 10/15/2014    ,  PTA 11/28/2018, 2:45 PM  [facility] [address] [place], [place], [address]  Phone: [phone]   Fax:  [phone]  Name: Gail Blackburn MRN: [id number] Date of Birth: 1976/04/04

## 2018-12-05 ENCOUNTER — Other Ambulatory Visit: Payer: Self-pay

## 2018-12-05 ENCOUNTER — Telehealth: Payer: Self-pay | Admitting: Neurology

## 2018-12-05 ENCOUNTER — Encounter: Payer: Self-pay | Admitting: Physical Therapy

## 2018-12-05 ENCOUNTER — Ambulatory Visit: Payer: Medicaid Other | Admitting: Physical Therapy

## 2018-12-05 DIAGNOSIS — M542 Cervicalgia: Secondary | ICD-10-CM

## 2018-12-05 DIAGNOSIS — M6281 Muscle weakness (generalized): Secondary | ICD-10-CM | POA: Diagnosis present

## 2018-12-05 DIAGNOSIS — R279 Unspecified lack of coordination: Secondary | ICD-10-CM | POA: Diagnosis present

## 2018-12-05 DIAGNOSIS — R252 Cramp and spasm: Secondary | ICD-10-CM | POA: Diagnosis present

## 2018-12-05 DIAGNOSIS — R293 Abnormal posture: Secondary | ICD-10-CM | POA: Diagnosis present

## 2018-12-05 NOTE — Therapy (Signed)
[facility] Outpatient Rehabilitation [facility] [address] [place], [place], [address] Phone: [phone]   Fax:  [phone]  Physical Therapy Treatment  Patient Details  Name: Gail Blackburn MRN: [id number] Date of Birth: 14-Nov-1975 Referring Provider (PT): Crista Elliot, MD; Naomie Dean, MD   Encounter Date: 12/05/2018  PT End of Session - 12/05/18 1428    Visit Number  4    Number of Visits  20    Date for PT Re-Evaluation  01/23/19    Authorization Type  MCD    Authorization - Visit Number  1    Authorization - Number of Visits  12    PT Start Time  1425    PT Stop Time  1458    PT Time Calculation (min)  33 min    Activity Tolerance  Patient tolerated treatment well    Behavior During Therapy  [facility] for tasks assessed/performed       Past Medical History:  Diagnosis Date  . Anemia    PRIOR HISTORY  . Anxiety   . Asthma   . Bipolar disorder (HCC)    Borderline  . Depression   . Fibromyalgia   . Headache    MIGRAINES  . Hypothyroidism 2009   TOOK MEDS FOR FEW WEEKS NO MEDS NOW  . Migraine   . Thyroid disease    treated in past not sure if too high or too low    Past Surgical History:  Procedure Laterality Date  . COLONOSCOPY    . DILATION AND EVACUATION N/A 05/06/2016   Procedure: DILATATION AND EVACUATION;  Surgeon: Huel Cote, MD;  Location: [facility];  Service: Gynecology;  Laterality: N/A;  . FOOT SURGERY    . TEETH PULLED  03/2016  . WISDOM TOOTH EXTRACTION      There were no vitals filed for this visit.  Subjective Assessment - 12/05/18 1426    Subjective  Went to chiropractor last week which was helpful. I have an appointment after this. Had a migraine this morning but meds helped.     Currently in Pain?  Yes    Pain Score  7     Pain Location  Neck    Pain Descriptors / Indicators  Sore;Tightness;Tender    Aggravating Factors   turning, palpating, turning head    Pain Relieving Factors  chiropractor, DN                        [facility] Adult PT Treatment/Exercise - 12/05/18 0001      Neck Exercises: Supine   Neck Retraction Limitations  supine chin tuck    Capital Flexion Limitations  retraction + head lift      Neck Exercises: Prone   Other Prone Exercise  prone cervical retraction      Manual Therapy   Manual therapy comments  skilled palpation and monitoring during TPDN    Soft tissue mobilization  SCM Rt, bil upper traps, suboccipitals       Trigger Point Dry Needling - 12/05/18 0001    Muscles Treated Head and Neck  Upper trapezius;Oblique capitus;Sternocleidomastoid;Suboccipitals;Cervical multifidi   all muscles on Rt   Sternocleidomastoid Response  Twitch response elicited;Palpable increased muscle length    Upper Trapezius Response  Twitch reponse elicited;Palpable increased muscle length    Oblique Capitus Response  Twitch response elicited;Palpable increased muscle length    SubOccipitals Response  Twitch response elicited;Palpable increased muscle length    Cervical multifidi Response  Twitch reponse elicited;Palpable increased muscle length           PT Education - 12/05/18 1740    Education Details  need for treatment that provides long term relief    Person(s) Educated  Patient    Methods  Explanation    Comprehension  Verbalized understanding;Need further instruction       PT Short Term Goals - 05/07/18 1452      PT SHORT TERM GOAL #1   Title  Patient will be indepdnent with HEP     Baseline  understands and is able to demo    Status  Achieved      PT SHORT TERM GOAL #2   Title  Neck pain decr 15-20%     Baseline  about 7/10 today, reports decrease 30%    Status  Achieved        PT Long Term Goals - 11/20/18 2158      PT LONG TERM GOAL #1   Title  ind with HEP for management of symptoms    Baseline  does not know, just learned some self care today    Time  12    Period  Weeks    Status  New    Target Date  01/23/19      PT LONG  TERM GOAL #2   Title  able to empty bladder without any leakage upon standing    Baseline  she reports a few drops every time    Time  12    Period  Weeks    Status  New    Target Date  01/23/19      PT LONG TERM GOAL #3   Title  able to contract pelvic floor with 3/5 MMT and hold for 5 seconds in order to demonstrate improved muscle control for toileting function    Baseline  2/5, hold for 2 seconds    Time  12    Period  Weeks    Status  New    Target Date  01/23/19      PT LONG TERM GOAL #4   Title  reports she is able to urinate without pushing    Baseline  reports maximum bearing down to empty bladder    Time  12    Period  Weeks    Status  New    Target Date  01/23/19      PT LONG TERM GOAL #5   Title  Pt will report cervical pain <= 2/10 with daily activities    Baseline  7/10 at eval    Time  6    Period  Weeks    Status  New    Target Date  01/04/19      Additional Long Term Goals   Additional Long Term Goals  Yes      PT LONG TERM GOAL #6   Title  Pt will be independent with long term HEP and postural alignment for continued care    Baseline  will progress and establish as appropriate    Time  6    Period  Weeks    Status  New    Target Date  01/04/19            Plan - 12/05/18 1741    Clinical Impression Statement  PT was running behind so session was started late. Continues to have gross tightness around cervical region and pt reports continued stress as she is not currently living at home.  Poor deep neck flexor endurance noted and challenged through HEP. Will call to schedule appointments with pelvic health PT for continued care there as well.     PT Treatment/Interventions  ADLs/Self Care Home Management;Biofeedback;Therapeutic exercise;Therapeutic activities;Neuromuscular re-education;Patient/family education;Scar mobilization;Passive range of motion;Dry needling;Manual techniques;Taping;Cryotherapy;Electrical Stimulation;Moist  Heat;Traction;Ultrasound    PT Next Visit Plan  cont deep neck flexor training, review decompression    PT Home Exercise Plan  self care toileting and ab massage, self massage and contract and hold; door pec stretch, continue band exercises from last session; cervical retraction- supine & prone, supine head lifts.     Consulted and Agree with Plan of Care  Patient       Patient will benefit from skilled therapeutic intervention in order to improve the following deficits and impairments:  Increased muscle spasms, Impaired tone, Decreased strength, Increased fascial restricitons, Impaired flexibility, Decreased coordination, Pain, Improper body mechanics, Postural dysfunction, Decreased activity tolerance, Decreased range of motion  Visit Diagnosis: Cervicalgia  Abnormal posture     Problem List Patient Active Problem List   Diagnosis Date Noted  . Abdominal wall lump 02/21/2018  . Chronic fatigue syndrome with fibromyalgia 02/24/2017  . Skin lesion of left leg 02/10/2017  . History of abnormal cervical Pap smear 02/10/2017  . Pregnancy 04/12/2016  . Anxiety 10/15/2014  . Depression 10/15/2014  . Migraine 10/15/2014  . Chronic pain 10/15/2014  . Low TSH level 10/15/2014  . Tobacco use disorder 10/15/2014    Raja Caputi C. Jayen Bromwell PT, DPT 12/05/18 5:47 PM   [facility] Outpatient Rehabilitation [facility] [address] [place], [place], [address] Phone: [phone]   Fax:  [phone]  Name: Diannie Minix MRN: [id number] Date of Birth: [birth year]

## 2018-12-05 NOTE — Telephone Encounter (Signed)
I called [facility] @ [phone] to request a refill on the patients Botox medication. It was ready to ship and they received a paid claim. I spoke with Taneasha. It was scheduled for delivery on 12/12/18. DW

## 2018-12-11 ENCOUNTER — Ambulatory Visit: Payer: Medicaid Other | Admitting: Physical Therapy

## 2018-12-11 ENCOUNTER — Encounter: Payer: Self-pay | Admitting: Physical Therapy

## 2018-12-11 ENCOUNTER — Other Ambulatory Visit: Payer: Self-pay

## 2018-12-11 DIAGNOSIS — M6281 Muscle weakness (generalized): Secondary | ICD-10-CM

## 2018-12-11 DIAGNOSIS — M542 Cervicalgia: Secondary | ICD-10-CM

## 2018-12-11 DIAGNOSIS — R293 Abnormal posture: Secondary | ICD-10-CM

## 2018-12-11 NOTE — Therapy (Addendum)
[facility] [place], [place], [address] Phone: [phone]   Fax:  [phone]  Physical Therapy Treatment  Patient Details  Name: Gail Blackburn MRN: [id number] Date of Birth: [birth year] Referring Provider (PT): Lucas Mallow, MD; Sarina Ill, MD   Encounter Date: 12/11/2018  PT End of Session - 12/11/18 1422    Visit Number  5    Number of Visits  20    Date for PT Re-Evaluation  01/23/19    Authorization Type  MCD    Authorization - Visit Number  2    Authorization - Number of Visits  12    PT Start Time  0217    PT Stop Time  9323    PT Time Calculation (min)  38 min       Past Medical History:  Diagnosis Date  . Anemia    PRIOR HISTORY  . Anxiety   . Asthma   . Bipolar disorder ([place])    Borderline  . Depression   . Fibromyalgia   . Headache    MIGRAINES  . Hypothyroidism 2009   TOOK MEDS FOR FEW WEEKS NO MEDS NOW  . Migraine   . Thyroid disease    treated in past not sure if too high or too low    Past Surgical History:  Procedure Laterality Date  . COLONOSCOPY    . DILATION AND EVACUATION N/A 05/06/2016   Procedure: DILATATION AND EVACUATION;  Surgeon: Paula Compton, MD;  Location: [facility];  Service: Gynecology;  Laterality: N/A;  . FOOT SURGERY    . TEETH PULLED  03/2016  . WISDOM TOOTH EXTRACTION      There were no vitals filed for this visit.  Subjective Assessment - 12/11/18 1420    Subjective  Having pressure on right side of head that causes a migraine. Left side of neck is tight. 5/10 pain today.     Currently in Pain?  Yes    Pain Score  5     Pain Location  Neck    Pain Orientation  Left;Right    Pain Descriptors / Indicators  Tightness    Aggravating Factors   turning     Pain Relieving Factors  chiropractor, DN          [facility] PT Assessment - 12/11/18 0001      AROM   Cervical - Right Rotation  65    Cervical - Left Rotation  58                    [facility] Adult PT Treatment/Exercise - 12/11/18 0001      Neck Exercises: Supine   Neck Retraction Limitations  supine chin tuck    Capital Flexion Limitations  retraction + head lift 5 x 10 sec     Other Supine Exercise  shoulder press from decompession series.     Other Supine Exercise  supine blue band horizontals, ER , pullovers with tension x 10 each      Neck Exercises: Prone   Other Prone Exercise  prone cervical retraction x 15    Other Prone Exercise  prone scap retract x 10 ,       Shoulder Exercises: Standing   External Rotation  20 reps;Both    Theraband Level (Shoulder External Rotation)  Level 2 (Red)    Extension  20 reps    Theraband Level (Shoulder Extension)  Level 4 (Blue)    Row  20  reps    Theraband Level (Shoulder Row)  Level 4 (Blue)      Neck Exercises: Stretches   Other Neck Stretches  --    Other Neck Stretches  door way stretch 2 x 30 sec                PT Short Term Goals - 05/07/18 1452      PT SHORT TERM GOAL #1   Title  Patient will be indepdnent with HEP     Baseline  understands and is able to demo    Status  Achieved      PT SHORT TERM GOAL #2   Title  Neck pain decr 15-20%     Baseline  about 7/10 today, reports decrease 30%    Status  Achieved        PT Long Term Goals - 11/20/18 2158      PT LONG TERM GOAL #1   Title  ind with HEP for management of symptoms    Baseline  does not know, just learned some self care today    Time  12    Period  Weeks    Status  New    Target Date  01/23/19      PT LONG TERM GOAL #2   Title  able to empty bladder without any leakage upon standing    Baseline  she reports a few drops every time    Time  12    Period  Weeks    Status  New    Target Date  01/23/19      PT LONG TERM GOAL #3   Title  able to contract pelvic floor with 3/5 MMT and hold for 5 seconds in order to demonstrate improved muscle control for toileting function    Baseline  2/5, hold for 2  seconds    Time  12    Period  Weeks    Status  New    Target Date  01/23/19      PT LONG TERM GOAL #4   Title  reports she is able to urinate without pushing    Baseline  reports maximum bearing down to empty bladder    Time  12    Period  Weeks    Status  New    Target Date  01/23/19      PT LONG TERM GOAL #5   Title  Pt will report cervical pain <= 2/10 with daily activities    Baseline  7/10 at eval    Time  6    Period  Weeks    Status  New    Target Date  01/04/19      Additional Long Term Goals   Additional Long Term Goals  Yes      PT LONG TERM GOAL #6   Title  Pt will be independent with long term HEP and postural alignment for continued care    Baseline  will progress and establish as appropriate    Time  6    Period  Weeks    Status  New    Target Date  01/04/19            Plan - 12/11/18 1440    Clinical Impression Statement  Pt agreeable to treatment despite TPDN not available by PTA. Focused deep neck flexor stretngthening and review of HEP as she has not performed HEP. Cervical rotation improved and she reports less overall pain.  PT Next Visit Plan  cont deep neck flexor training, review decompression    PT Home Exercise Plan  self care toileting and ab massage, self massage and contract and hold; door pec stretch, continue band exercises from last session; cervical retraction- supine & prone, supine head lifts.     Consulted and Agree with Plan of Care  Patient       Patient will benefit from skilled therapeutic intervention in order to improve the following deficits and impairments:  Increased muscle spasms, Impaired tone, Decreased strength, Increased fascial restricitons, Impaired flexibility, Decreased coordination, Pain, Improper body mechanics, Postural dysfunction, Decreased activity tolerance, Decreased range of motion  Visit Diagnosis: Cervicalgia  Abnormal posture  Muscle weakness (generalized)     Problem List Patient Active  Problem List   Diagnosis Date Noted  . Abdominal wall lump 02/21/2018  . Chronic fatigue syndrome with fibromyalgia 02/24/2017  . Skin lesion of left leg 02/10/2017  . History of abnormal cervical Pap smear 02/10/2017  . Pregnancy 04/12/2016  . Anxiety 10/15/2014  . Depression 10/15/2014  . Migraine 10/15/2014  . Chronic pain 10/15/2014  . Low TSH level 10/15/2014  . Tobacco use disorder 10/15/2014    Dorene Ar, PTA 12/11/2018, 3:05 PM  [facility] [address] [place], [place], [address] Phone: [phone]   Fax:  [phone]  Name: Gail Blackburn MRN: [id number] Date of Birth: [birth year]  PHYSICAL THERAPY DISCHARGE SUMMARY  Visits from Start of Care: 5  Current functional level related to goals / functional outcomes: See above   Remaining deficits: See above   Education / Equipment: HEP Plan: Patient agrees to discharge.  Patient goals were not met. Patient is being discharged due to not returning since the last visit.  ?????     [employer], PT 02/28/19 4:15 PM

## 2018-12-14 ENCOUNTER — Other Ambulatory Visit: Payer: Self-pay

## 2018-12-14 ENCOUNTER — Ambulatory Visit: Payer: Medicaid Other | Admitting: Neurology

## 2018-12-14 DIAGNOSIS — G43711 Chronic migraine without aura, intractable, with status migrainosus: Secondary | ICD-10-CM | POA: Diagnosis not present

## 2018-12-14 MED ORDER — METHYLPREDNISOLONE 4 MG PO TBPK: ORAL_TABLET | ORAL | 1 refills | Status: DC

## 2018-12-14 MED ORDER — METHOCARBAMOL 500 MG PO TABS: 500.0000 mg | ORAL_TABLET | Freq: Four times a day (QID) | ORAL | 6 refills | Status: DC | PRN

## 2018-12-14 NOTE — Progress Notes (Signed)
Botox- 100 units x 2 vials Lot: [id number] Expiration: 08/22 NDC: 0023-1145-01  Bacteriostatic 0.9% Sodium Chloride- 4mL total Lot: [id number] Expiration: 06/27/2019 NDC: 0409-1966-02  Dx: G43.711 S/P    

## 2018-12-14 NOTE — Progress Notes (Signed)
Consent Form Botulism Toxin Injection For Chronic Migraine  Interval history 12/14/2018: This is our 5th botox.  Significantly improved, >50% reduction in migraine frequency. She improved on Botox to 15 headache days a month and 7 are migrainous. Unfortunately still with migraine/headache burden started Emgality at last visit.  +levator scapulae bilat,masseters, temples and oo. Baseline is daily headaches and 15 migraine days a month. Will switch to emgality, botox working tremendously but ajovy may not be working will try [employer].   09/14/2018: Started her on Emgality, last appointment  Last appointment 05/2018: Please perform dry needling on cervical muscles for cervical myofascial pain. She loved dry needling. She will be going back, she will let us know when she wants a referral. Will refer for acupuncture at [facility].    Reviewed orally with patient, additionally signature is on file:  Botulism toxin has been approved by the Federal drug administration for treatment of chronic migraine. Botulism toxin does not cure chronic migraine and it may not be effective in some patients.  The administration of botulism toxin is accomplished by injecting a small amount of toxin into the muscles of the neck and head. Dosage must be titrated for each individual. Any benefits resulting from botulism toxin tend to wear off after 3 months with a repeat injection required if benefit is to be maintained. Injections are usually done every 3-4 months with maximum effect peak achieved by about 2 or 3 weeks. Botulism toxin is expensive and you should be sure of what costs you will incur resulting from the injection.  The side effects of botulism toxin use for chronic migraine may include:   -Transient, and usually mild, facial weakness with facial injections  -Transient, and usually mild, head or neck weakness with head/neck injections  -Reduction or loss of forehead facial animation due to forehead muscle  weakness  -Eyelid drooping  -Dry eye  -Pain at the site of injection or bruising at the site of injection  -Double vision  -Potential unknown long term risks  Contraindications: You should not have Botox if you are pregnant, nursing, allergic to albumin, have an infection, skin condition, or muscle weakness at the site of the injection, or have myasthenia gravis, Lambert-Eaton syndrome, or ALS.  It is also possible that as with any injection, there may be an allergic reaction or no effect from the medication. Reduced effectiveness after repeated injections is sometimes seen and rarely infection at the injection site may occur. All care will be taken to prevent these side effects. If therapy is given over a long time, atrophy and wasting in the muscle injected may occur. Occasionally the patient's become refractory to treatment because they develop antibodies to the toxin. In this event, therapy needs to be modified.  I have read the above information and consent to the administration of botulism toxin.    BOTOX PROCEDURE NOTE FOR MIGRAINE HEADACHE    Contraindications and precautions discussed with patient(above). Aseptic procedure was observed and patient tolerated procedure. Procedure performed by Dr. Artemio Aly  The condition has existed for more than 6 months, and pt does not have a diagnosis of ALS, Myasthenia Gravis or Lambert-Eaton Syndrome.  Risks and benefits of injections discussed and pt agrees to proceed with the procedure.  Written consent obtained  These injections are medically necessary. Pt  receives good benefits from these injections. These injections do not cause sedations or hallucinations which the oral therapies may cause.  Description of procedure:  The patient was placed in  a sitting position. The standard protocol was used for Botox as follows, with 5 units of Botox injected at each site:   -Procerus muscle, midline injection  -Corrugator muscle, bilateral  injection  -Frontalis muscle, bilateral injection, with 2 sites each side, medial injection was performed in the upper one third of the frontalis muscle, in the region vertical from the medial inferior edge of the superior orbital rim. The lateral injection was again in the upper one third of the forehead vertically above the lateral limbus of the cornea, 1.5 cm lateral to the medial injection site.  - Levator Scapulae: 5 units bilaterally  -Temporalis muscle injection, 5 sites, bilaterally. The first injection was 3 cm above the tragus of the ear, second injection site was 1.5 cm to 3 cm up from the first injection site in line with the tragus of the ear. The third injection site was 1.5-3 cm forward between the first 2 injection sites. The fourth injection site was 1.5 cm posterior to the second injection site. 5th site laterally in the temporalis  muscleat the level of the outer canthus.  - Patient feels her clenching is a trigger for headaches. +5 units masseter bilaterally   - Patient feels the migraines are centered around the eyes +5 units bilaterally at the outer canthus in the orbicularis occuli  -Occipitalis muscle injection, 3 sites, bilaterally. The first injection was done one half way between the occipital protuberance and the tip of the mastoid process behind the ear. The second injection site was done lateral and superior to the first, 1 fingerbreadth from the first injection. The third injection site was 1 fingerbreadth superiorly and medially from the first injection site.  -Cervical paraspinal muscle injection, 2 sites, bilateral knee first injection site was 1 cm from the midline of the cervical spine, 3 cm inferior to the lower border of the occipital protuberance. The second injection site was 1.5 cm superiorly and laterally to the first injection site.  -Trapezius muscle injection was performed at 3 sites, bilaterally. The first injection site was in the upper trapezius muscle  halfway between the inflection point of the neck, and the acromion. The second injection site was one half way between the acromion and the first injection site. The third injection was done between the first injection site and the inflection point of the neck.   Will return for repeat injection in 3 months.   A 200 unit sof Botox was used, any Botox not injected was wasted. The patient tolerated the procedure well, there were no complications of the above procedure.

## 2018-12-18 ENCOUNTER — Ambulatory Visit: Payer: Medicaid Other | Admitting: Physical Therapy

## 2018-12-25 ENCOUNTER — Ambulatory Visit: Payer: Medicaid Other | Admitting: Physical Therapy

## 2018-12-31 ENCOUNTER — Telehealth: Payer: Self-pay | Admitting: Physical Therapy

## 2018-12-31 NOTE — Telephone Encounter (Signed)
She was contacted today regarding temporary reduction of Outpatient Rehabilitation Services at [facility] due to concerns for community transmission of COVID-19 and that her appointments would need to be cancelled. S   Patient is aware we can be reached by telephone during limited business hours in the meantime and that we now have telehealth PT  visits if she is intrested.  Ivery Quale, PT, DPT 12/31/18 9:41 AM

## 2019-01-01 ENCOUNTER — Ambulatory Visit: Payer: Medicaid Other | Admitting: Physical Therapy

## 2019-03-08 ENCOUNTER — Telehealth: Payer: Self-pay | Admitting: Neurology

## 2019-03-08 NOTE — Telephone Encounter (Signed)
I got a message from the answering service that Gail Blackburn "Has an issue w/[facility] refill and needs DR to contact insurance company per pharmacy"  I called her to get details but got VM.   I left a message that I would forward the message to Dr. Jaynee Eagles and Romelle Starcher

## 2019-03-08 NOTE — Telephone Encounter (Signed)
Thanks, I sent her an email hopefully she will read it and not page you back. thanks

## 2019-03-12 ENCOUNTER — Encounter: Payer: Self-pay | Admitting: Family Medicine

## 2019-03-12 ENCOUNTER — Other Ambulatory Visit: Payer: Self-pay

## 2019-03-12 ENCOUNTER — Ambulatory Visit (INDEPENDENT_AMBULATORY_CARE_PROVIDER_SITE_OTHER): Payer: Medicaid Other | Admitting: Family Medicine

## 2019-03-12 DIAGNOSIS — G43709 Chronic migraine without aura, not intractable, without status migrainosus: Secondary | ICD-10-CM

## 2019-03-12 NOTE — Progress Notes (Signed)
Consent Form Botulism Toxin Injection For Chronic Migraine  This is her 6th Botox procedure. She is getting significant relief of migraines but feels that benefit wears off around the 11th week. She has noticed > 50% reduction in migraines.   Reviewed orally with patient, additionally signature is on file:  Botulism toxin has been approved by the Federal drug administration for treatment of chronic migraine. Botulism toxin does not cure chronic migraine and it may not be effective in some patients.  The administration of botulism toxin is accomplished by injecting a small amount of toxin into the muscles of the neck and head. Dosage must be titrated for each individual. Any benefits resulting from botulism toxin tend to wear off after 3 months with a repeat injection required if benefit is to be maintained. Injections are usually done every 3-4 months with maximum effect peak achieved by about 2 or 3 weeks. Botulism toxin is expensive and you should be sure of what costs you will incur resulting from the injection.  The side effects of botulism toxin use for chronic migraine may include:   -Transient, and usually mild, facial weakness with facial injections  -Transient, and usually mild, head or neck weakness with head/neck injections  -Reduction or loss of forehead facial animation due to forehead muscle weakness  -Eyelid drooping  -Dry eye  -Pain at the site of injection or bruising at the site of injection  -Double vision  -Potential unknown long term risks   Contraindications: You should not have Botox if you are pregnant, nursing, allergic to albumin, have an infection, skin condition, or muscle weakness at the site of the injection, or have myasthenia gravis, Lambert-Eaton syndrome, or ALS.  It is also possible that as with any injection, there may be an allergic reaction or no effect from the medication. Reduced effectiveness after repeated injections is sometimes seen and rarely  infection at the injection site may occur. All care will be taken to prevent these side effects. If therapy is given over a long time, atrophy and wasting in the muscle injected may occur. Occasionally the patient's become refractory to treatment because they develop antibodies to the toxin. In this event, therapy needs to be modified.  I have read the above information and consent to the administration of botulism toxin.    BOTOX PROCEDURE NOTE FOR MIGRAINE HEADACHE  Contraindications and precautions discussed with patient(above). Aseptic procedure was observed and patient tolerated procedure. Procedure performed by Dr. Georgia Dom  The condition has existed for more than 6 months, and pt does not have a diagnosis of ALS, Myasthenia Gravis or Lambert-Eaton Syndrome.  Risks and benefits of injections discussed and pt agrees to proceed with the procedure.  Written consent obtained  These injections are medically necessary. Pt  receives good benefits from these injections. These injections do not cause sedations or hallucinations which the oral therapies may cause.   Description of procedure:  The patient was placed in a sitting position. The standard protocol was used for Botox as follows, with 5 units of Botox injected at each site:  -Procerus muscle, midline injection  -Corrugator muscle, bilateral injection  -Frontalis muscle, bilateral injection, with 2 sites each side, medial injection was performed in the upper one third of the frontalis muscle, in the region vertical from the medial inferior edge of the superior orbital rim. The lateral injection was again in the upper one third of the forehead vertically above the lateral limbus of the cornea, 1.5 cm lateral to  the medial injection site.  -Temporalis muscle injection, 4 sites, bilaterally. The first injection was 3 cm above the tragus of the ear, second injection site was 1.5 cm to 3 cm up from the first injection site in line with the  tragus of the ear. The third injection site was 1.5-3 cm forward between the first 2 injection sites. The fourth injection site was 1.5 cm posterior to the second injection site. 5th site laterally in the temporalis  muscleat the level of the outer canthus.  -Occipitalis muscle injection, 3 sites, bilaterally. The first injection was done one half way between the occipital protuberance and the tip of the mastoid process behind the ear. The second injection site was done lateral and superior to the first, 1 fingerbreadth from the first injection. The third injection site was 1 fingerbreadth superiorly and medially from the first injection site.  -Cervical paraspinal muscle injection, 2 sites, bilateral knee first injection site was 1 cm from the midline of the cervical spine, 3 cm inferior to the lower border of the occipital protuberance. The second injection site was 1.5 cm superiorly and laterally to the first injection site.  -Trapezius muscle injection was performed at 3 sites, bilaterally. The first injection site was in the upper trapezius muscle halfway between the inflection point of the neck, and the acromion. The second injection site was one half way between the acromion and the first injection site. The third injection was done between the first injection site and the inflection point of the neck.  - Masseters injected with 5 units bilaterally    Will return for repeat injection in 3 months.   A 165 units of Botox was used, any Botox not injected was wasted. The patient tolerated the procedure well, there were no complications of the above procedure.

## 2019-03-12 NOTE — Progress Notes (Signed)
B[facility]OX SP [facility]  NDC 3300762263  x2   LOT [id number] Exp 10/2021.

## 2019-03-20 ENCOUNTER — Telehealth: Payer: Self-pay

## 2019-03-20 NOTE — Telephone Encounter (Signed)
Spoke with the patient and they have given verbal consent to file insurance and to do a mychart video visit. Mychart video link has been sent to the patient.   

## 2019-03-21 ENCOUNTER — Ambulatory Visit (INDEPENDENT_AMBULATORY_CARE_PROVIDER_SITE_OTHER): Payer: Medicaid Other | Admitting: Family Medicine

## 2019-03-21 ENCOUNTER — Encounter: Payer: Self-pay | Admitting: Family Medicine

## 2019-03-21 ENCOUNTER — Other Ambulatory Visit: Payer: Self-pay

## 2019-03-21 DIAGNOSIS — G43709 Chronic migraine without aura, not intractable, without status migrainosus: Secondary | ICD-10-CM

## 2019-03-21 MED ORDER — CYCLOBENZAPRINE HCL 5 MG PO TABS: 5.0000 mg | ORAL_TABLET | Freq: Three times a day (TID) | ORAL | 5 refills | Status: DC | PRN

## 2019-03-21 MED ORDER — RIZATRIPTAN BENZOATE 10 MG PO TBDP: ORAL_TABLET | ORAL | 11 refills | Status: DC

## 2019-03-21 NOTE — Progress Notes (Signed)
PATIENT: Gail Blackburn DOB: 08-02-1976  REASON FOR VISIT: follow up HISTORY FROM: patient  Virtual Visit via Telephone Note  I connected with Gail Blackburn on 03/21/19 at  3:00 PM EDT by telephone and verified that I am speaking with the correct person using two identifiers.   I discussed the limitations, risks, security and privacy concerns of performing an evaluation and management service by telephone and the availability of in person appointments. I also discussed with the patient that there may be a patient responsible charge related to this service. The patient expressed understanding and agreed to proceed.   History of Present Illness:  03/21/19 Gail Blackburn is a 43 y.o. female here today for follow up for migraines. She is currently doing great on Botox therapy.  She also continues Emgality monthly and propranolol 60 mg daily.  She is using multiple abortive therapies including Robaxin, rizatriptan, Zofran and pulled the hearing cream.  She also takes Celebrex as needed.  She reports that her migraines continue to be fairly frequent.  They first month following Botox therapy she reports migraines are very well managed with less than 5 in a month.  She states that the second month following her migraines increase.  By the third month she is having greater than 15 headache/migraine days in a month.  She does feel that the rizatriptan helps but states that she is going through a full prescription in a month.  She is taken a 5 mg tablet.  She reports that the Robaxin no longer offers relief.  She has taken tizanidine in the past as well with no benefit.  She is requesting a prescription for Flexeril.  She has taken this in the past with significant benefit.  She was also participating in dry needling prior to pandemic.  She is planning to call to start therapy again. 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  This is managed by her psychiatrist.  She is doing very well and  has been at her current dose for 5 years.  She has not considered weaning Suboxone.      History (copied from Dr Trevor MaceAhern's not on 04/07/2017)  HPI:  Gail Blackburn is a 43 y.o. female here as a referral from Dr. Myrtie SomanWarden for migraines. PMHx migraines, anxiety, depression. Started when she was [age]. Had them off and on for years. Has daily headaches for the last 10 years. Over 15 a month are migrainous. Can last all day long up to 24 hours or sometimes days. Can be severe 10/10 pain. They start behind the left eye and spread to the back of the head, pounding, throbbing, pulsating, with superimposed sharp pains, severe light sensitivity has to go into a dark room, she has nausea. No vomiting. No aura. Staring at the screen makes it worse. She has a lot of blurred vision and eye pain. Headaches worsening for the last 3 months. Worse with bending over, positional. She has a lot of neck pain. Had nerve blocks in the past and went [facility] the [facility]. Nerve blocks did not help.Headaches are worse bending over, vision changes. No medication overuse. No aura. No other focal neurologic deficits, associated symptoms, inciting events or modifiable factors.  Tried: Nortriptyline, Amitriptyline, cymbalta, celebrex, gabapentin,   Reviewed notes, labs and imaging from outside physicians, which showed:   B12, TSH nml. BMP and CBC  Essentially normal.    Observations/Objective:  Generalized: Well developed, in no acute distress  Mentation: Alert oriented to time,  place, history taking. Follows all commands speech and language fluent   Assessment and Plan:  43 y.o. year old female  has a past medical history of Anemia, Anxiety, Asthma, Bipolar disorder ([place]), Depression, Fibromyalgia, Headache, Hypothyroidism (2009), Migraine, and Thyroid disease. here with    ICD-10-CM   1. Chronic migraine without aura without status migrainosus, not intractable  G43.709 cyclobenzaprine (FLEXERIL) 5 MG tablet     rizatriptan (MAXALT-MLT) 10 MG disintegrating tablet   Actually feels that she is doing very well with current migraine therapy.  She will continue Botox treatments every 3 months.  We will continue Emgality every month.  Propranolol 60 mg ER continue daily.  I have increased the dosage of rizatriptan to 10 mg.  She was instructed that she can take 1 tablet at onset of headache.  She may repeat 1 tablet in 2 hours if needed but no more than 2 tablets in 24 hours.  I have also called in Flexeril 5 mg every 8 hours as needed.  I have instructed her not to take this on a daily basis.  I have advised that she discontinue Robaxin.  We will continue Zofran as needed for nausea.  I have also discussed with her the potential correlation with Suboxone and migraines.  I have advised that she reach out to her psychiatrist to discuss this.  I have encouraged her to consider weaning as appropriate.  She verbalizes willingness to consider but is fearful of a relapse as she was previously addicted to pain pills.  I have advised that she work very closely with her psychiatrist.  We will see her back annually for her office visit and every 3 months for her Botox.  She verbalizes understanding and agreement with this plan.  No orders of the defined types were placed in this encounter.   Meds ordered this encounter  Medications   cyclobenzaprine (FLEXERIL) 5 MG tablet    Sig: Take 1 tablet (5 mg total) by mouth every 8 (eight) hours as needed for muscle spasms.    Dispense:  30 tablet    Refill:  5    Order Specific Question:   Supervising Provider    Answer:   Melvenia Beam [[id number]]   rizatriptan (MAXALT-MLT) 10 MG disintegrating tablet    Sig: 1 TABLET FOR MIGRAINE. MAY REPEAT IN 2 HOURS.    Dispense:  10 tablet    Refill:  11    Please consider 90 day supplies to promote better adherence    Order Specific Question:   Supervising Provider    Answer:   Melvenia Beam [[id number]]     Follow Up  Instructions:  I discussed the assessment and treatment plan with the patient. The patient was provided an opportunity to ask questions and all were answered. The patient agreed with the plan and demonstrated an understanding of the instructions.   The patient was advised to call back or seek an in-person evaluation if the symptoms worsen or if the condition fails to improve as anticipated.  I provided 25 minutes of non-face-to-face time during this encounter.  [place] is located at her place of residence during video conference.  Provider is located at her place of residence.  Maryelizabeth Kaufmann, CMA helped to facilitate visit.   Debbora Presto, NP

## 2019-03-25 NOTE — Progress Notes (Signed)
Made any corrections needed, and agree with history, physical, neuro exam,assessment and plan as stated.     Antonia Ahern, MD [facility]  

## 2019-04-08 ENCOUNTER — Telehealth: Payer: Self-pay | Admitting: Neurology

## 2019-04-08 NOTE — Telephone Encounter (Signed)
I called to schedule the patient for Botox injections but she did not answer so i left a VM asking her to call back. DW  °

## 2019-04-25 ENCOUNTER — Ambulatory Visit: Payer: Medicaid Other | Admitting: Physical Therapy

## 2019-04-30 NOTE — Telephone Encounter (Signed)
Pt returned call. Please call back when available. 

## 2019-05-13 NOTE — Telephone Encounter (Signed)
I called and scheduled the patient. DW  °

## 2019-05-20 ENCOUNTER — Ambulatory Visit: Payer: Medicaid Other

## 2019-05-20 NOTE — Progress Notes (Deleted)
  Subjective:   Patient ID: Gail Blackburn    DOB: Apr 16, 1976, 43 y.o. female   MRN: [id number]  Gail Blackburn is a 43 y.o. female with a history of migraine, fibromyalgia with chronic fatigue, anxiety/depression, tobacco use here for   Rash on hand  Had rash for *** days. Location: *** Medications tried: *** Similar rash in past: *** Patient believes may be caused by ***  New medications or antibiotics: *** Tick, Insect or new pet exposure: *** Recent travel: *** New detergent or soap: *** Immunocompromised: ***  Symptoms Itching: *** Pain over rash: *** Feeling ill all over: *** Fever: *** Mouth sores: *** Face or tongue swelling: *** Trouble breathing: *** Joint swelling or pain: ***  Review of Systems:  Per HPI.  [place], medications and smoking status reviewed.  Objective:   There were no vitals taken for this visit. Vitals and nursing note reviewed.  General: well nourished, well developed, in no acute distress with non-toxic appearance HEENT: normocephalic, atraumatic, moist mucous membranes Neck: supple, non-tender without lymphadenopathy CV: regular rate and rhythm without murmurs, rubs, or gallops, no lower extremity edema Lungs: clear to auscultation bilaterally with normal work of breathing Abdomen: soft, non-tender, non-distended, no masses or organomegaly palpable, normoactive bowel sounds Skin: warm, dry, no rashes or lesions Extremities: warm and well perfused, normal tone MSK: ROM grossly intact, strength intact, gait normal Neuro: Alert and oriented, speech normal  Assessment & Plan:   No problem-specific Assessment & Plan notes found for this encounter.  No orders of the defined types were placed in this encounter.  No orders of the defined types were placed in this encounter.   Rory Percy, DO PGY-3, [facility] 05/20/2019 1:25 PM

## 2019-05-21 ENCOUNTER — Other Ambulatory Visit: Payer: Self-pay

## 2019-05-21 ENCOUNTER — Telehealth: Payer: Self-pay | Admitting: *Deleted

## 2019-05-21 ENCOUNTER — Ambulatory Visit: Payer: Medicaid Other | Admitting: Family Medicine

## 2019-05-21 ENCOUNTER — Encounter: Payer: Self-pay | Admitting: Family Medicine

## 2019-05-21 VITALS — BP 108/80 | HR 102 | Wt 151.2 lb

## 2019-05-21 DIAGNOSIS — B352 Tinea manuum: Secondary | ICD-10-CM | POA: Diagnosis not present

## 2019-05-21 MED ORDER — TERBINAFINE HCL 1 % EX CREA: 1.0000 "application " | TOPICAL_CREAM | Freq: Two times a day (BID) | CUTANEOUS | 2 refills | Status: DC

## 2019-05-21 MED ORDER — TERBINAFINE 1 % EX GEL: 1.0000 "application " | Freq: Two times a day (BID) | CUTANEOUS | 2 refills | Status: DC

## 2019-05-21 NOTE — Telephone Encounter (Signed)
Terbinafine only comes in a cream, not gel.  Okay to change? [name], CMA d

## 2019-05-21 NOTE — Assessment & Plan Note (Signed)
Red/scaly/non pustular/largely circular rash filling most of palmar surface with some central clearing.  There since February when it started as a much smaller spot, no known trigger but patient does use a public gym and often wears lifting gloves.  Has tried multiple OTC creams that she cannot remember at this time.  No other location of the rash, no one else in home has it  [place] tinnea manuum, will offer terbinifine 1% BID for 4 wks.  Instructed to return in 4wks for re-evaluation unless this gets worse, may need oral medication if not resolved by then.  Keep out of eyes.

## 2019-05-21 NOTE — Patient Instructions (Signed)
It was a pleasure to see you today! Thank you for choosing [facility] for your primary care. Kirah Stice was seen for rash on hand. Come back to the clinic in a month if this has not resolved or in 2 weeks if it is getting worse.    Tinnea Mannum:    Today were giving you a medicine called terbinafine which helps treat the infection that we believe you have in your hand.  You should use this twice daily for 4 weeks.  If this does not solve your problem we may talk about increasing the strength of treatment to an oral medication that he can take by mouth.  The general first course of action would to try with a topical solution.  Please do not get this in your eyes.   Please bring all your medications to every doctors visit   Sign up for My Chart to have easy access to your labs results, and communication with your Primary care physician.     Please check-out at the front desk before leaving the clinic.     Best,  Dr. Sherene Sires FAMILY MEDICINE RESIDENT - PGY3 05/21/2019 11:49 AM

## 2019-05-21 NOTE — Progress Notes (Signed)
    Subjective:  Gail Blackburn is a 43 y.o. female who presents to the [facility] today with a chief complaint of rash on hand.   HPI: Tinea manuum Red/scaly/non pustular/largely circular rash filling most of palmar surface with some central clearing.  There since February when it started as a much smaller spot, no known trigger but patient does use a public gym and often wears lifting gloves.  Has tried multiple OTC creams that she cannot remember at this time.  No other location of the rash, no one else in home has it   Objective:  Physical Exam: BP 108/80   Pulse (!) 102   Wt 151 lb 3.2 oz (68.6 kg)   LMP 04/30/2019 (Approximate)   SpO2 98%   BMI 24.78 kg/m   Gen: NAD, conversin comfortably CV: regular rate to my exam, no murmur noted Pulm: NWOB, CTAB with no crackles, wheezes, or rhonchi GI: Normal bowel sounds present. Soft, Nontender, Nondistended. MSK: no edema, cyanosis, or clubbing noted Skin: *right hand: Red/scaly/non pustular/largely circular rash filling most of palmar surface with some central clearing.   Neuro: grossly normal, moves all extremities Psych: Normal affect and thought content  No results found for this or any previous visit (from the past 72 hour(s)).   Assessment/Plan:  Tinea manuum Red/scaly/non pustular/largely circular rash filling most of palmar surface with some central clearing.  There since February when it started as a much smaller spot, no known trigger but patient does use a public gym and often wears lifting gloves.  Has tried multiple OTC creams that she cannot remember at this time.  No other location of the rash, no one else in home has it  [place] tinnea manuum, will offer terbinifine 1% BID for 4 wks.  Instructed to return in 4wks for re-evaluation unless this gets worse, may need oral medication if not resolved by then.  Keep out of eyes.   Sherene Sires, DO FAMILY MEDICINE RESIDENT - PGY3 05/21/2019 2:28 PM

## 2019-05-21 NOTE — Telephone Encounter (Signed)
Nope.  Called it in and changed in chart. [name], CMA

## 2019-06-06 ENCOUNTER — Telehealth: Payer: Self-pay | Admitting: Neurology

## 2019-06-06 DIAGNOSIS — M7918 Myalgia, other site: Secondary | ICD-10-CM

## 2019-06-06 NOTE — Telephone Encounter (Signed)
Pt called stating that she is needing a new referral for her dry needling since she has not been able to go since the "whole Covid situation." Please advise.

## 2019-06-06 NOTE — Telephone Encounter (Signed)
Placed order again

## 2019-06-19 ENCOUNTER — Ambulatory Visit (INDEPENDENT_AMBULATORY_CARE_PROVIDER_SITE_OTHER): Payer: Medicaid Other | Admitting: Family Medicine

## 2019-06-19 ENCOUNTER — Encounter: Payer: Self-pay | Admitting: Family Medicine

## 2019-06-19 ENCOUNTER — Other Ambulatory Visit: Payer: Self-pay

## 2019-06-19 DIAGNOSIS — G43709 Chronic migraine without aura, not intractable, without status migrainosus: Secondary | ICD-10-CM

## 2019-06-19 NOTE — Progress Notes (Signed)
Botox- 200 units x 4 vials Lot: [id number] Expiration: 01/2022 NDC: 2620-3559-74  Bacteriostatic 0.9% Sodium Chloride- 5mL total Lot: [id number] Expiration: 03/26/2020 NDC: 3646-8032-12  Dx: Y48.250 S/P

## 2019-06-19 NOTE — Progress Notes (Signed)
She continues to have great success with Botox therapy. She notes increased migraines just prior to next Botox procedure.   Consent Form Botulism Toxin Injection For Chronic Migraine    Reviewed orally with patient, additionally signature is on file:  Botulism toxin has been approved by the Federal drug administration for treatment of chronic migraine. Botulism toxin does not cure chronic migraine and it may not be effective in some patients.  The administration of botulism toxin is accomplished by injecting a small amount of toxin into the muscles of the neck and head. Dosage must be titrated for each individual. Any benefits resulting from botulism toxin tend to wear off after 3 months with a repeat injection required if benefit is to be maintained. Injections are usually done every 3-4 months with maximum effect peak achieved by about 2 or 3 weeks. Botulism toxin is expensive and you should be sure of what costs you will incur resulting from the injection.  The side effects of botulism toxin use for chronic migraine may include:   -Transient, and usually mild, facial weakness with facial injections  -Transient, and usually mild, head or neck weakness with head/neck injections  -Reduction or loss of forehead facial animation due to forehead muscle weakness  -Eyelid drooping  -Dry eye  -Pain at the site of injection or bruising at the site of injection  -Double vision  -Potential unknown long term risks   Contraindications: You should not have Botox if you are pregnant, nursing, allergic to albumin, have an infection, skin condition, or muscle weakness at the site of the injection, or have myasthenia gravis, Lambert-Eaton syndrome, or ALS.  It is also possible that as with any injection, there may be an allergic reaction or no effect from the medication. Reduced effectiveness after repeated injections is sometimes seen and rarely infection at the injection site may occur. All care  will be taken to prevent these side effects. If therapy is given over a long time, atrophy and wasting in the muscle injected may occur. Occasionally the patient's become refractory to treatment because they develop antibodies to the toxin. In this event, therapy needs to be modified.  I have read the above information and consent to the administration of botulism toxin.    BOTOX PROCEDURE NOTE FOR MIGRAINE HEADACHE  Contraindications and precautions discussed with patient(above). Aseptic procedure was observed and patient tolerated procedure. Procedure performed by Dr. Artemio Aly  The condition has existed for more than 6 months, and pt does not have a diagnosis of ALS, Myasthenia Gravis or Lambert-Eaton Syndrome.  Risks and benefits of injections discussed and pt agrees to proceed with the procedure.  Written consent obtained  These injections are medically necessary. Pt  receives good benefits from these injections. These injections do not cause sedations or hallucinations which the oral therapies may cause.   Description of procedure:  The patient was placed in a sitting position. The standard protocol was used for Botox as follows, with 5 units of Botox injected at each site:  -Procerus muscle, midline injection  -Corrugator muscle, bilateral injection  -Frontalis muscle, bilateral injection, with 2 sites each side, medial injection was performed in the upper one third of the frontalis muscle, in the region vertical from the medial inferior edge of the superior orbital rim. The lateral injection was again in the upper one third of the forehead vertically above the lateral limbus of the cornea, 1.5 cm lateral to the medial injection site.  -Temporalis muscle injection, 4 sites,  bilaterally. The first injection was 3 cm above the tragus of the ear, second injection site was 1.5 cm to 3 cm up from the first injection site in line with the tragus of the ear. The third injection site was  1.5-3 cm forward between the first 2 injection sites. The fourth injection site was 1.5 cm posterior to the second injection site. 5th site laterally in the temporalis  muscleat the level of the outer canthus.  -Occipitalis muscle injection, 3 sites, bilaterally. The first injection was done one half way between the occipital protuberance and the tip of the mastoid process behind the ear. The second injection site was done lateral and superior to the first, 1 fingerbreadth from the first injection. The third injection site was 1 fingerbreadth superiorly and medially from the first injection site.  -Cervical paraspinal muscle injection, 2 sites, bilateral knee first injection site was 1 cm from the midline of the cervical spine, 3 cm inferior to the lower border of the occipital protuberance. The second injection site was 1.5 cm superiorly and laterally to the first injection site.  -Trapezius muscle injection was performed at 3 sites, bilaterally. The first injection site was in the upper trapezius muscle halfway between the inflection point of the neck, and the acromion. The second injection site was one half way between the acromion and the first injection site. The third injection was done between the first injection site and the inflection point of the neck.  -masseter bilaterally   Will return for repeat injection in 3 months.   A 155 units of Botox was used, any Botox not injected was wasted. The patient tolerated the procedure well, there were no complications of the above procedure.

## 2019-06-20 NOTE — Progress Notes (Signed)
Made any corrections needed, and agree with procedure  Antonia Ahern, MD [facility]  

## 2019-06-25 ENCOUNTER — Ambulatory Visit: Payer: Medicaid Other | Attending: Neurology | Admitting: Physical Therapy

## 2019-06-25 ENCOUNTER — Other Ambulatory Visit: Payer: Self-pay

## 2019-06-25 ENCOUNTER — Encounter: Payer: Self-pay | Admitting: Physical Therapy

## 2019-06-25 DIAGNOSIS — M542 Cervicalgia: Secondary | ICD-10-CM | POA: Diagnosis not present

## 2019-06-25 DIAGNOSIS — M6281 Muscle weakness (generalized): Secondary | ICD-10-CM | POA: Insufficient documentation

## 2019-06-25 DIAGNOSIS — R293 Abnormal posture: Secondary | ICD-10-CM

## 2019-06-25 NOTE — Therapy (Signed)
[facility] [address] [place], [place], [address] Phone: [phone]   Fax:  [phone]  Physical Therapy Re-evaluation for additional MCD authorization  Patient Details  Name: Gail Blackburn MRN: [id number] Date of Birth: 03-08-1976 Referring Provider (PT): Lucia GaskinsAhern, MD   Encounter Date: 06/25/2019  PT End of Session - 06/25/19 1208    Visit Number  4    Number of Visits  16    Date for PT Re-Evaluation  08/06/19    Authorization Type  MCD RE-eval to submit for more visits on 06/25/19    PT Start Time  1135    PT Stop Time  1215    PT Time Calculation (min)  40 min    Behavior During Therapy  [facility] assessed/per[facility]       Past Medical History:  Diagnosis Date  . Anemia    PRIOR HISTORY  . Anxiety   . Asthma   . Bipolar disorder (HCC)    Borderline  . Depression   . Fibromyalgia   . Headache    MIGRAINES  . Hypothyroidism 2009   TOOK MEDS FOR FEW WEEKS NO MEDS NOW  . Migraine   . Thyroid disease    treated in past not sure if too high or too low    Past Surgical History:  Procedure Laterality Date  . COLONOSCOPY    . DILATION AND EVACUATION N/A 05/06/2016   Procedure: DILATATION AND EVACUATION;  Surgeon: Huel CoteKathy Richardson, MD;  Location: [facility];  Service: Gynecology;  Laterality: N/A;  . FOOT SURGERY    . TEETH PULLED  03/2016  . WISDOM TOOTH EXTRACTION      There were no vitals filed for this visit.  Subjective Assessment - 06/25/19 1137    Subjective  Pt relays things have been worse since she stopped PT in march due to covid and she has not had transportation to come back but now she is able to use her aunts care. She relays neck pain is about 8/9 out of 10 in her neck, shoulders, and Lt shoulder blade numbness and tingling. She relays pain is aggravated by stress, turning her head when driving (looking Lt is worse than Rt). She gets flare ups sometimes after zumba. She has been getting botox for  headaches. She uses heat and massage to help with pain some. She relays she still gets 3-4 headaches per week still but the migranes are eased with botox some. She relays the traction and Dry needling help the most with PT.    Currently in Pain?  Yes   see above        [facility] - 06/25/19 0001      Assessment   Medical Diagnosis  Cervicalgia    Referring Provider (PT)  Lucia GaskinsAhern, MD    Onset Date/Surgical Date  --   Chronic     ROM / Strength   AROM / PROM / Strength  AROM;Strength      AROM   Overall AROM   Due to precautions    AROM Assessment Site  Cervical    Cervical Flexion  25    Cervical Extension  30    Cervical - Right Side Bend  30    Cervical - Left Side Bend  30    Cervical - Right Rotation  50    Cervical - Left Rotation  50      Strength   Overall Strength Comments  grip strength 60 lbs bilat,  UE strength overall 5/5 MMT grossly except shoulder ER 4/5 MMT bilat      Palpation   Palpation comment  tightness and TTP in cervical thoracic spine and paraspinals, Increaesed scapular winging on Lt                    [facility] Adult PT Treatment/Exercise - 06/25/19 0001      Modalities   Modalities  Traction      Traction   Type of Traction  Cervical    Min (lbs)  10    Max (lbs)  15    Time  15 min pre set program             PT Education - 06/25/19 1207    Education Details  POC to schedule with lawrie when available or any other PT who can do DN intervention.    Person(s) Educated  Patient    Methods  Explanation    Comprehension  Verbalized understanding;Need further instruction       PT Short Term Goals - 06/25/19 1238      PT SHORT TERM GOAL #1   Title  Patient will be indepdnent with HEP     Baseline  has not been compliant since stopping PT in march    Time  2    Period  Weeks    Status  On-going      PT SHORT TERM GOAL #2   Title  Neck pain decr 15-20%     Baseline  initially improved but since stopping PT has  regressed to 8-9/10 pain    Time  2    Period  Weeks    Status  On-going        PT Long Term Goals - 06/25/19 1242      PT LONG TERM GOAL #1   Title  Pt will report cervical pain <= 3/10 with daily activities    Baseline  8-9/10    Time  6    Period  Weeks    Status  Revised      PT LONG TERM GOAL #2   Title  Pt will improve bilat shoulder ER strength to at least 5-/5 MMT to improve function    Baseline  4/5 MMT    Time  6    Period  Weeks    Status  Revised      PT LONG TERM GOAL #3   Title  Pt will improve neck ROM to WNL to improve funciton.    Time  6    Period  Weeks    Status  Revised            Plan - 06/25/19 1213    Clinical Impression Statement  Re-evaluaiton perfomed today as she is out of MCD visits and has not been to PT since March 2020 where she had to quit coming due to covid then lack of transportation. This barier is now resolved and she is able to attend PT, she has new precription from MD for same diagnosis of neck pain and she is having headaches. She has good overall strength except for some scapular winging and instabiliy in her rotator cuff. She has limited ROM, increased muscle tightness and tension, and increased pain. She has lost some ROM since she was in PT last. She will benefit from skilled PT to address her deficits. Goals revised to reflect neck diagnosis and no longer womens health.    Personal Factors and Comorbidities  Past/Current Experience;Comorbidity 1;Comorbidity 2;Comorbidity 3+    Comorbidities  migranes, fibromyalgia, anx, bipolar    Examination-Activity Limitations  Sleep;Carry;Reach Overhead    Examination-Participation Restrictions  Driving;Laundry    Rehab Potential  Good    PT Frequency  2x / week    PT Duration  6 weeks    PT Treatment/Interventions  ADLs/Self Care Home Management;Cryotherapy;Electrical Stimulation;Iontophoresis 4mg /ml Dexamethasone;Moist Heat;Traction;Ultrasound;Therapeutic activities;Therapeutic  exercise;Neuromuscular re-education;Manual techniques;Passive range of motion;Dry needling;Taping;Spinal Manipulations;Joint Manipulations    PT Next Visit Plan  Dry needling, needs stretching and ROM for scapular thoracic, consider modalaties for pain, needs Lt scapular stability, needs pain management techniques    PT Home Exercise Plan  assess next time when time allows, she should have some HEP from earlier in the year that she reports she is not really doing       Patient will benefit from skilled therapeutic intervention in order to improve the following deficits and impairments:  Decreased activity tolerance, Decreased range of motion, Decreased strength, Increased fascial restricitons, Increased muscle spasms, Impaired flexibility, Pain  Visit Diagnosis: Cervicalgia  Abnormal posture  Muscle weakness (generalized)     Problem List Patient Active Problem List   Diagnosis Date Noted  . Tinea manuum 05/21/2019  . Abdominal wall lump 02/21/2018  . Chronic fatigue syndrome with fibromyalgia 02/24/2017  . Skin lesion of left leg 02/10/2017  . History of abnormal cervical Pap smear 02/10/2017  . Anxiety 10/15/2014  . Depression 10/15/2014  . Migraine 10/15/2014  . Chronic pain 10/15/2014  . Low TSH level 10/15/2014  . Tobacco use disorder 10/15/2014    Debbe Odea, PT,DPT 06/25/2019, 12:54 PM  [facility] [address] [place], [place], [address] Phone: [phone]   Fax:  [phone]  Name: Gail Blackburn MRN: [id number] Date of Birth: 06-19-1976

## 2019-07-09 ENCOUNTER — Other Ambulatory Visit: Payer: Self-pay

## 2019-07-09 ENCOUNTER — Ambulatory Visit: Payer: Medicaid Other | Attending: Neurology | Admitting: Physical Therapy

## 2019-07-09 ENCOUNTER — Encounter: Payer: Self-pay | Admitting: Physical Therapy

## 2019-07-09 DIAGNOSIS — M542 Cervicalgia: Secondary | ICD-10-CM | POA: Insufficient documentation

## 2019-07-09 DIAGNOSIS — R293 Abnormal posture: Secondary | ICD-10-CM | POA: Diagnosis present

## 2019-07-09 DIAGNOSIS — R252 Cramp and spasm: Secondary | ICD-10-CM | POA: Insufficient documentation

## 2019-07-09 DIAGNOSIS — M6281 Muscle weakness (generalized): Secondary | ICD-10-CM | POA: Insufficient documentation

## 2019-07-09 NOTE — Therapy (Signed)
Gail Blackburn, Gail Blackburn, [address] Phone: [phone]   Fax:  [phone]  Physical Therapy Treatment  Patient Details  Name: Gail Blackburn MRN: [id number] Date of Birth: [birth year] Referring Provider (PT): Jaynee Eagles, MD   Encounter Date: 07/09/2019  PT End of Session - 07/09/19 1240    Visit Number  5    Number of Visits  16    Date for PT Re-Evaluation  08/06/19    Authorization Type  MCD RE-eval to submit for more visits on 06/25/19    Authorization Time Period  CCME approved 3 visits 07/08/2019  to 07-28-2019    PT Start Time  1147    PT Stop Time  1240    PT Time Calculation (min)  53 min    Activity Tolerance  Patient tolerated treatment well    Behavior During Therapy  [facility] for tasks assessed/performed       Past Medical History:  Diagnosis Date  . Anemia    PRIOR HISTORY  . Anxiety   . Asthma   . Bipolar disorder ([place])    Borderline  . Depression   . Fibromyalgia   . Headache    MIGRAINES  . Hypothyroidism 2009   TOOK MEDS FOR FEW WEEKS NO MEDS NOW  . Migraine   . Thyroid disease    treated in past not sure if too high or too low    Past Surgical History:  Procedure Laterality Date  . COLONOSCOPY    . DILATION AND EVACUATION N/A 05/06/2016   Procedure: DILATATION AND EVACUATION;  Surgeon: Paula Compton, MD;  Location: [facility];  Service: Gynecology;  Laterality: N/A;  . FOOT SURGERY    . TEETH PULLED  03/2016  . WISDOM TOOTH EXTRACTION      There were no vitals filed for this visit.  Subjective Assessment - 07/09/19 1151    Subjective  Pt enters clinic and wants to have TPDN done today.   Has not had needling done since March.    Patient Stated Goals  Reduce pain and lose tension in my neck,  and prevent migraines    Currently in Pain?  Yes    Pain Score  7     Pain Location  Neck    Pain Orientation  Right;Left    Pain Descriptors / Indicators  Aching;Spasm;Tightness    Pain Type   Chronic pain    Pain Onset  More than a month ago    Pain Frequency  Constant    Aggravating Factors   stress,  sleeping  trying to use special pillow,    Pain Relieving Factors  yoga                       [facility] Adult PT Treatment/Exercise - 07/09/19 0001      Self-Care   Self-Care  Other Self-Care Comments    Other Self-Care Comments   use of weight, importance of strengthening for feeling of tightness, re education on TPDN and benefits and limitaitons      Manual Therapy   Manual Therapy  Joint mobilization;Soft tissue mobilization    Manual therapy comments  skilled palpation with TPDN    Joint Mobilization  UPA for left C-3 to C-6. laterala glide on left, Grade 3    Soft tissue mobilization  PROM with over pressure,  bil Upper trap and levator and medial rhomboids RT scalene    Myofascial Release  sub occipital release  Trigger Point Dry Needling - 07/09/19 0001    Consent Given?  Yes    Education Handout Provided  Yes    Muscles Treated Head and Neck  Scalenes;Upper trapezius;Levator scapulae;Splenius capitus;Suboccipitals;Oblique capitus;Cervical multifidi    Other Dry Needling  use of 40 mm  over left rib 5th and 6th    Upper Trapezius Response  Twitch reponse elicited;Palpable increased muscle length    Oblique Capitus Response  Twitch response elicited;Palpable increased muscle length    Levator Scapulae Response  Twitch response elicited;Palpable increased muscle length    Scalenes Response  Twitch reponse elicited;Palpable increased muscle length   right only   Splenius capitus Response  Palpable increased muscle length    Cervical multifidi Response  Twitch reponse elicited;Palpable increased muscle length   C-3 to C-6   Subscapularis Response  Twitch response elicited;Palpable increased muscle length   bil twitch on left side          PT Education - 07/09/19 1228    Education Details  self care for benefits of strengthening, education on  TPDN and limtations    Person(s) Educated  Patient    Methods  Explanation;Handout    Comprehension  Verbalized understanding       PT Short Term Goals - 06/25/19 1238      PT SHORT TERM GOAL #1   Title  Patient will be indepdnent with HEP     Baseline  has not been compliant since stopping PT in march    Time  2    Period  Weeks    Status  On-going      PT SHORT TERM GOAL #2   Title  Neck pain decr 15-20%     Baseline  initially improved but since stopping PT has regressed to 8-9/10 pain    Time  2    Period  Weeks    Status  On-going        PT Long Term Goals - 06/25/19 1242      PT LONG TERM GOAL #1   Title  Pt will report cervical pain <= 3/10 with daily activities    Baseline  8-9/10    Time  6    Period  Weeks    Status  Revised      PT LONG TERM GOAL #2   Title  Pt will improve bilat shoulder ER strength to at least 5-/5 MMT to improve function    Baseline  4/5 MMT    Time  6    Period  Weeks    Status  Revised      PT LONG TERM GOAL #3   Title  Pt will improve neck ROM to WNL to improve funciton.    Time  6    Period  Weeks    Status  Revised            Plan - 07/09/19 1234    Clinical Impression Statement  Pt enters clinic after re eval last week.  Ms [name] consents and desires TPDN and was reeducated on benefits and limitiations of TPDN.  Ms [name] knows that she will be [facility]ing [facility] to eventual HEP to manage headaches and pain symptoms.  Pt tolerates TPDN and is closely monitored thoughout session.  Pt verbalizes she knows all her exericises but did not demonstrate.  Ms [name] was told she will probably need more challenging HEP to control her pain as before COVID prevented her from progressing.Pt with several twitch responses  especially on left cervical mx. will continue toward completion of POC    Personal Factors and Comorbidities  Past/Current Experience;Comorbidity 1;Comorbidity 2;Comorbidity 3+    Examination-Activity Limitations   Sleep;Carry;Reach Overhead    Examination-Participation Restrictions  Driving;Laundry    Rehab Potential  Good    PT Frequency  2x / week    PT Duration  6 weeks    PT Treatment/Interventions  ADLs/Self Care Home Management;Cryotherapy;Electrical Stimulation;Iontophoresis 4mg /ml Dexamethasone;Moist Heat;Traction;Ultrasound;Therapeutic activities;Therapeutic exercise;Neuromuscular re-education;Manual techniques;Passive range of motion;Dry needling;Taping;Spinal Manipulations;Joint Manipulations    PT Next Visit Plan  Dry needling, needs stretching and ROM for scapular thoracic, consider modalaties for pain, needs Lt scapular stability, needs pain management techniques Pt told that most needling is for 6 x only and pain should be managed with pain management techniques and HEP    PT Home Exercise Plan  assess next time when time allows, she should have some HEP from earlier in the year that she reports she is not really doing Review HEP next visit    Consulted and Agree with Plan of Care  Patient       Patient will benefit from skilled therapeutic intervention in order to improve the following deficits and impairments:  Decreased activity tolerance, Decreased range of motion, Decreased strength, Increased fascial restricitons, Increased muscle spasms, Impaired flexibility, Pain  Visit Diagnosis: Cervicalgia  Abnormal posture  Muscle weakness (generalized)  Cramp and spasm     Problem List Patient Active Problem List   Diagnosis Date Noted  . Tinea manuum 05/21/2019  . Abdominal wall lump 02/21/2018  . Chronic fatigue syndrome with fibromyalgia 02/24/2017  . Skin lesion of left leg 02/10/2017  . History of abnormal cervical Pap smear 02/10/2017  . Anxiety 10/15/2014  . Depression 10/15/2014  . Migraine 10/15/2014  . Chronic pain 10/15/2014  . Low TSH level 10/15/2014  . Tobacco use disorder 10/15/2014    10/17/2014, PT Certified Exercise Expert for the Aging Adult   07/09/19 12:42 PM Phone: [phone] Fax: [phone]  [facility] [address] [place], [place], [place] Phone: [phone]   Fax:  [phone]  Name: Gail Blackburn MRN: Angeline Slim Date of Birth: [birth year]

## 2019-07-09 NOTE — Patient Instructions (Signed)
Trigger Point Dry Needling  . What is Trigger Point Dry Needling (DN)? o DN is a physical therapy technique used to treat muscle pain and dysfunction. Specifically, DN helps deactivate muscle trigger points (muscle knots).  o A thin filiform needle is used to penetrate the skin and stimulate the underlying trigger point. The goal is for a local twitch response (LTR) to occur and for the trigger point to relax. No medication of any kind is injected during the procedure.   . What Does Trigger Point Dry Needling Feel Like?  o The procedure feels different for each individual patient. Some patients report that they do not actually feel the needle enter the skin and overall the process is not painful. Very mild bleeding may occur. However, many patients feel a deep cramping in the muscle in which the needle was inserted. This is the local twitch response.   Marland Kitchen How Will I feel after the treatment? o Soreness is normal, and the onset of soreness may not occur for a few hours. Typically this soreness does not last longer than two days.  o Bruising is uncommon, however; ice can be used to decrease any possible bruising.  o In rare cases feeling tired or nauseous after the treatment is normal. In addition, your symptoms may get worse before they get better, this period will typically not last longer than 24 hours.   . What Can I do After My Treatment? o Increase your hydration by drinking more water for the next 24 hours. o You may place ice or heat on the areas treated that have become sore, however, do not use heat on inflamed or bruised areas. Heat often brings more relief post needling. o You can continue your regular activities, but vigorous activity is not recommended initially after the treatment for 24 hours. o DN is best combined with other physical therapy such as strengthening, stretching, and other therapies.   Voncille Lo, PT Certified Exercise Expert for the Aging Adult  07/09/19 11:56  AM Phone: [phone] Fax: [phone]

## 2019-07-11 ENCOUNTER — Other Ambulatory Visit: Payer: Self-pay

## 2019-07-11 ENCOUNTER — Encounter: Payer: Self-pay | Admitting: Physical Therapy

## 2019-07-11 ENCOUNTER — Ambulatory Visit: Payer: Medicaid Other | Admitting: Physical Therapy

## 2019-07-11 DIAGNOSIS — M542 Cervicalgia: Secondary | ICD-10-CM

## 2019-07-11 DIAGNOSIS — R252 Cramp and spasm: Secondary | ICD-10-CM

## 2019-07-11 DIAGNOSIS — M6281 Muscle weakness (generalized): Secondary | ICD-10-CM

## 2019-07-11 DIAGNOSIS — R293 Abnormal posture: Secondary | ICD-10-CM

## 2019-07-11 NOTE — Therapy (Signed)
[facility] [address] [place], [place], [address] Phone: [phone]   Fax:  [phone]  Physical Therapy Treatment  Patient Details  Name: Gail Blackburn MRN: [id number] Date of Birth: 1975/12/19 Referring Provider (PT): Lucia GaskinsAhern, MD   Encounter Date: 07/11/2019  PT End of Session - 07/11/19 1504    Visit Number  6    Number of Visits  16    Date for PT Re-Evaluation  08/06/19    Authorization Time Period  CCME approved 3 visits 07/08/2019  to 07-28-2019    Authorization - Visit Number  2    Authorization - Number of Visits  3    PT Start Time  1502    PT Stop Time  1530    PT Time Calculation (min)  28 min    Activity Tolerance  Patient tolerated treatment well    Behavior During Therapy  [facility] for tasks assessed/performed       Past Medical History:  Diagnosis Date  . Anemia    PRIOR HISTORY  . Anxiety   . Asthma   . Bipolar disorder (HCC)    Borderline  . Depression   . Fibromyalgia   . Headache    MIGRAINES  . Hypothyroidism 2009   TOOK MEDS FOR FEW WEEKS NO MEDS NOW  . Migraine   . Thyroid disease    treated in past not sure if too high or too low    Past Surgical History:  Procedure Laterality Date  . COLONOSCOPY    . DILATION AND EVACUATION N/A 05/06/2016   Procedure: DILATATION AND EVACUATION;  Surgeon: Huel CoteKathy Richardson, MD;  Location: [facility];  Service: Gynecology;  Laterality: N/A;  . FOOT SURGERY    . TEETH PULLED  03/2016  . WISDOM TOOTH EXTRACTION      There were no vitals filed for this visit.  Subjective Assessment - 07/11/19 1503    Subjective  Most tension today on Rt upper trap toward midline and bil suboccipitals.    Patient Stated Goals  Reduce pain and lose tension in my neck,  and prevent migraines    Currently in Pain?  Yes    Pain Score  6     Pain Location  Neck    Pain Orientation  Right    Pain Descriptors / Indicators  Tightness                       [facility]  Adult PT Treatment/Exercise - 07/11/19 0001      Exercises   Exercises  Neck      Manual Therapy   Manual Therapy  Taping    Manual therapy comments  skilled palpation with TPDN    Joint Mobilization  thoracic PA, gross rib ER    Soft tissue mobilization  suboccipitals, cervical paraspinals, Rt upper trap    Kinesiotex  Inhibit Muscle;Facilitate Muscle      Kinesiotix   Inhibit Muscle   upper trap    Facilitate Muscle   scapular retraction      Neck Exercises: Stretches   Other Neck Stretches  supine chin tuck    Other Neck Stretches  supine cervical rotation       Trigger Point Dry Needling - 07/11/19 0001    Other Dry Needling  suboccipitals bilateral    Upper Trapezius Response  Twitch reponse elicited;Palpable increased muscle length   Right   Cervical multifidi Response  Twitch reponse elicited;Palpable increased muscle length  Rt C3-6            PT Short Term Goals - 06/25/19 1238      PT SHORT TERM GOAL #1   Title  Patient will be indepdnent with HEP     Baseline  has not been compliant since stopping PT in march    Time  2    Period  Weeks    Status  On-going      PT SHORT TERM GOAL #2   Title  Neck pain decr 15-20%     Baseline  initially improved but since stopping PT has regressed to 8-9/10 pain    Time  2    Period  Weeks    Status  On-going        PT Long Term Goals - 06/25/19 1242      PT LONG TERM GOAL #1   Title  Pt will report cervical pain <= 3/10 with daily activities    Baseline  8-9/10    Time  6    Period  Weeks    Status  Revised      PT LONG TERM GOAL #2   Title  Pt will improve bilat shoulder ER strength to at least 5-/5 MMT to improve function    Baseline  4/5 MMT    Time  6    Period  Weeks    Status  Revised      PT LONG TERM GOAL #3   Title  Pt will improve neck ROM to WNL to improve funciton.    Time  6    Period  Weeks    Status  Revised            Plan - 07/11/19 1531    Clinical Impression  Statement  DN focused to Rt side as that is where her pain was focused today. Reported feeling sore when sitting up. Still tends toward a slouched, rounded shoulder posture so tape was placed for tactile cues.    PT Treatment/Interventions  ADLs/Self Care Home Management;Cryotherapy;Electrical Stimulation;Iontophoresis 4mg /ml Dexamethasone;Moist Heat;Traction;Ultrasound;Therapeutic activities;Therapeutic exercise;Neuromuscular re-education;Manual techniques;Passive range of motion;Dry needling;Taping;Spinal Manipulations;Joint Manipulations    PT Next Visit Plan  submit MCD, review HEP, Dry needling, needs stretching and ROM for scapular thoracic, consider modalaties for pain, needs Lt scapular stability, needs pain management techniques Pt told that most needling is for 6 x only and pain should be managed with pain management techniques and HEP    Consulted and Agree with Plan of Care  Patient       Patient will benefit from skilled therapeutic intervention in order to improve the following deficits and impairments:  Decreased activity tolerance, Decreased range of motion, Decreased strength, Increased fascial restricitons, Increased muscle spasms, Impaired flexibility, Pain  Visit Diagnosis: Cervicalgia  Abnormal posture  Muscle weakness (generalized)  Cramp and spasm     Problem List Patient Active Problem List   Diagnosis Date Noted  . Tinea manuum 05/21/2019  . Abdominal wall lump 02/21/2018  . Chronic fatigue syndrome with fibromyalgia 02/24/2017  . Skin lesion of left leg 02/10/2017  . History of abnormal cervical Pap smear 02/10/2017  . Anxiety 10/15/2014  . Depression 10/15/2014  . Migraine 10/15/2014  . Chronic pain 10/15/2014  . Low TSH level 10/15/2014  . Tobacco use disorder 10/15/2014    Sherry Rogus C. Aniela Caniglia PT, DPT 07/11/19 3:34 PM   [facility] [address] [place], [place], [place] Phone: [phone]    Fax:  [phone]  Name: Gail Blackburn MRN: [id number] Date of Birth: 1976-05-30

## 2019-07-16 ENCOUNTER — Ambulatory Visit: Payer: Medicaid Other | Admitting: Physical Therapy

## 2019-07-18 ENCOUNTER — Encounter: Payer: Self-pay | Admitting: Physical Therapy

## 2019-07-18 ENCOUNTER — Ambulatory Visit: Payer: Medicaid Other | Admitting: Physical Therapy

## 2019-07-18 ENCOUNTER — Other Ambulatory Visit: Payer: Self-pay

## 2019-07-18 DIAGNOSIS — M6281 Muscle weakness (generalized): Secondary | ICD-10-CM

## 2019-07-18 DIAGNOSIS — M542 Cervicalgia: Secondary | ICD-10-CM

## 2019-07-18 DIAGNOSIS — R293 Abnormal posture: Secondary | ICD-10-CM

## 2019-07-19 NOTE — Therapy (Signed)
[place], [place], [address] Phone: [phone]   Fax:  [phone]  Physical Therapy Treatment/ERO  Patient Details  Name: Gail Blackburn MRN: [id number] Date of Birth: April 21, 1976 Referring Provider (PT): Jaynee Eagles, MD   Encounter Date: 07/18/2019  PT End of Session - 07/18/19 1510    Visit Number  7    Number of Visits  16    Date for PT Re-Evaluation  08/06/19    Authorization Type  MCD RE-eval to submit for more visits on 06/25/19    Authorization Time Period  CCME approved 3 visits 07/08/2019  to 07-28-2019    Authorization - Visit Number  3    Authorization - Number of Visits  3    PT Start Time  1508    PT Stop Time  1541    PT Time Calculation (min)  33 min    Activity Tolerance  Patient tolerated treatment well    Behavior During Therapy  [facility] for tasks assessed/performed       Past Medical History:  Diagnosis Date  . Anemia    PRIOR HISTORY  . Anxiety   . Asthma   . Bipolar disorder ([place])    Borderline  . Depression   . Fibromyalgia   . Headache    MIGRAINES  . Hypothyroidism 2009   TOOK MEDS FOR FEW WEEKS NO MEDS NOW  . Migraine   . Thyroid disease    treated in past not sure if too high or too low    Past Surgical History:  Procedure Laterality Date  . COLONOSCOPY    . DILATION AND EVACUATION N/A 05/06/2016   Procedure: DILATATION AND EVACUATION;  Surgeon: Paula Compton, MD;  Location: [facility];  Service: Gynecology;  Laterality: N/A;  . FOOT SURGERY    . TEETH PULLED  03/2016  . WISDOM TOOTH EXTRACTION      There were no vitals filed for this visit.  Subjective Assessment - 07/18/19 1509    Subjective  Stressed after helping my nephew with online schooling. I have a migraine that I feel at the base of my head and bridge of my nose.    Patient Stated Goals  Reduce pain and lose tension in my neck,  and prevent migraines    Currently in Pain?  Yes    Pain Score  7     Pain  Location  Neck    Pain Descriptors / Indicators  Sore;Headache         [facility] PT Assessment - 07/18/19 0001      Assessment   Medical Diagnosis  Cervicalgia    Referring Provider (PT)  Jaynee Eagles, MD    Onset Date/Surgical Date  --   chronic     Precautions   Precautions  None      Restrictions   Weight Bearing Restrictions  No      Balance Screen   Has the patient fallen in the past 6 months  No      [place] residence      Prior Function   Level of Independence  Independent    Vocation  Unemployed      Cognition   Overall Cognitive Status  Within Functional Limits for tasks assessed      Sensation   Additional Comments  occasional numbness in Lt arm      Posture/Postural Control   Posture Comments  able to correct but requires reminders  AROM   Cervical Flexion  39    Cervical Extension  38    Cervical - Right Side Bend  28    Cervical - Left Side Bend  30    Cervical - Right Rotation  58    Cervical - Left Rotation  54      Palpation   Palpation comment  TTp suboccipitals, tightness TMJ                   [facility] Adult PT Treatment/Exercise - 07/18/19 0001      Neck Exercises: Seated   Other Seated Exercise  postural alignment- activation patterns      Manual Therapy   Manual therapy comments  skilled palpation with TPDN    Joint Mobilization  cervical unilaterals and PA    Soft tissue mobilization  suboccipitals, bil cervical paraspinasl      Kinesiotix   Inhibit Muscle   upper trap    Facilitate Muscle   scapular retraction             PT Education - 07/19/19 0653    Education Details  goals, POC    Person(s) Educated  Patient    Methods  Explanation    Comprehension  Verbalized understanding       PT Short Term Goals - 07/18/19 1526      PT SHORT TERM GOAL #1   Title  Patient will be indepdnent with HEP     Status  Achieved      PT SHORT TERM GOAL #2   Title  Neck pain decr  15-20%     Baseline  pain has moved outof upper traps and central to suboccipitals/upper cervical    Status  Achieved        PT Long Term Goals - 06/25/19 1242      PT LONG TERM GOAL #1   Title  Pt will report cervical pain <= 3/10 with daily activities    Baseline  8-9/10    Time  6    Period  Weeks    Status  Revised      PT LONG TERM GOAL #2   Title  Pt will improve bilat shoulder ER strength to at least 5-/5 MMT to improve function    Baseline  4/5 MMT    Time  6    Period  Weeks    Status  Revised      PT LONG TERM GOAL #3   Title  Pt will improve neck ROM to WNL to improve funciton.    Time  6    Period  Weeks    Status  Revised            Plan - 07/18/19 1630    Clinical Impression Statement  Significant reduction in migraine pain following DN today but reported feeling increased tension in TMJs. discussed postural alignment and pt was able to obtain proper resting posture- ktape used again for tactile cues. Pt will continue to benefit from skilled PT in order to continue decreasing pain and reach functional goals.    PT Frequency  2x / week    PT Duration  4 weeks    PT Treatment/Interventions  ADLs/Self Care Home Management;Cryotherapy;Electrical Stimulation;Iontophoresis 4mg /ml Dexamethasone;Moist Heat;Traction;Ultrasound;Therapeutic activities;Therapeutic exercise;Neuromuscular re-education;Manual techniques;Passive range of motion;Dry needling;Taping;Spinal Manipulations;Joint Manipulations    PT Next Visit Plan  review HEP, Dry needling, needs stretching and ROM for scapular thoracic, consider modalaties for pain, needs Lt scapular stability, needs pain management  techniques Pt told that most needling is for 6 x only and pain should be managed with pain management techniques and HEP    PT Home Exercise Plan  assess next time when time allows, she should have some HEP from earlier in the year that she reports she is not really doing Review HEP next visit     Consulted and Agree with Plan of Care  Patient       Patient will benefit from skilled therapeutic intervention in order to improve the following deficits and impairments:  Decreased activity tolerance, Decreased range of motion, Decreased strength, Increased fascial restricitons, Increased muscle spasms, Impaired flexibility, Pain  Visit Diagnosis: Cervicalgia - Plan: PT plan of care cert/re-cert  Abnormal posture - Plan: PT plan of care cert/re-cert  Muscle weakness (generalized) - Plan: PT plan of care cert/re-cert     Problem List Patient Active Problem List   Diagnosis Date Noted  . Tinea manuum 05/21/2019  . Abdominal wall lump 02/21/2018  . Chronic fatigue syndrome with fibromyalgia 02/24/2017  . Skin lesion of left leg 02/10/2017  . History of abnormal cervical Pap smear 02/10/2017  . Anxiety 10/15/2014  . Depression 10/15/2014  . Migraine 10/15/2014  . Chronic pain 10/15/2014  . Low TSH level 10/15/2014  . Tobacco use disorder 10/15/2014    Jamille Fisher C. Aitana Burry PT, DPT 07/19/19 6:56 AM   [facility] [address] [place], [place], [address] Phone: [phone]   Fax:  [phone]  Name: Gail Blackburn MRN: [id number] Date of Birth: 1975-12-21

## 2019-07-23 ENCOUNTER — Other Ambulatory Visit: Payer: Self-pay

## 2019-07-23 ENCOUNTER — Ambulatory Visit: Payer: Medicaid Other | Admitting: Physical Therapy

## 2019-07-23 ENCOUNTER — Encounter: Payer: Self-pay | Admitting: Physical Therapy

## 2019-07-23 DIAGNOSIS — R293 Abnormal posture: Secondary | ICD-10-CM

## 2019-07-23 DIAGNOSIS — M542 Cervicalgia: Secondary | ICD-10-CM | POA: Diagnosis not present

## 2019-07-23 DIAGNOSIS — M6281 Muscle weakness (generalized): Secondary | ICD-10-CM

## 2019-07-23 NOTE — Therapy (Signed)
[place], [place], [address] Phone: [phone]   Fax:  [phone]  Physical Therapy Treatment  Patient Details  Name: Gail Blackburn MRN: [id number] Date of Birth: 1976/03/30 Referring Provider (PT): Jaynee Eagles, MD   Encounter Date: 07/23/2019  PT End of Session - 07/23/19 1505    Visit Number  8    Number of Visits  16    Date for PT Re-Evaluation  08/06/19    Authorization Type  MCD    Authorization Time Period  10/12-11/22    Authorization - Visit Number  1    Authorization - Number of Visits  12    PT Start Time  1501    PT Stop Time  1540    PT Time Calculation (min)  39 min    Activity Tolerance  Patient tolerated treatment well    Behavior During Therapy  [facility] for tasks assessed/performed       Past Medical History:  Diagnosis Date  . Anemia    PRIOR HISTORY  . Anxiety   . Asthma   . Bipolar disorder ([place])    Borderline  . Depression   . Fibromyalgia   . Headache    MIGRAINES  . Hypothyroidism 2009   TOOK MEDS FOR FEW WEEKS NO MEDS NOW  . Migraine   . Thyroid disease    treated in past not sure if too high or too low    Past Surgical History:  Procedure Laterality Date  . COLONOSCOPY    . DILATION AND EVACUATION N/A 05/06/2016   Procedure: DILATATION AND EVACUATION;  Surgeon: Paula Compton, MD;  Location: [facility];  Service: Gynecology;  Laterality: N/A;  . FOOT SURGERY    . TEETH PULLED  03/2016  . WISDOM TOOTH EXTRACTION      There were no vitals filed for this visit.  Subjective Assessment - 07/23/19 1505    Subjective  today neck is actually good, stiff but not hurting.    Patient Stated Goals  Reduce pain and lose tension in my neck,  and prevent migraines    Currently in Pain?  No/denies                       [facility] Adult PT Treatment/Exercise - 07/23/19 0001      Neck Exercises: Seated   Other Seated Exercise  GHJ ER in roundback, marching, oblique  twists    Other Seated Exercise  long leg sit ups      Manual Therapy   Manual therapy comments  cervical traction    Joint Mobilization  prone rib ER    Soft tissue mobilization  bil pectoralis & periscap      Kinesiotix   Facilitate Muscle   scpaular retraction               PT Short Term Goals - 07/18/19 1526      PT SHORT TERM GOAL #1   Title  Patient will be indepdnent with HEP     Status  Achieved      PT SHORT TERM GOAL #2   Title  Neck pain decr 15-20%     Baseline  pain has moved outof upper traps and central to suboccipitals/upper cervical    Status  Achieved        PT Long Term Goals - 06/25/19 1242      PT LONG TERM GOAL #1   Title  Pt will report cervical  pain <= 3/10 with daily activities    Baseline  8-9/10    Time  6    Period  Weeks    Status  Revised      PT LONG TERM GOAL #2   Title  Pt will improve bilat shoulder ER strength to at least 5-/5 MMT to improve function    Baseline  4/5 MMT    Time  6    Period  Weeks    Status  Revised      PT LONG TERM GOAL #3   Title  Pt will improve neck ROM to WNL to improve funciton.    Time  6    Period  Weeks    Status  Revised            Plan - 07/23/19 1542    Clinical Impression Statement  No need for DN today, utilized manual therapy techniques to lengthen pectoralis muscles and improve scapular retraction ability- Lt GHJ felt stiff in AP mobility. Improved posture following. Noted rib cage flare indicating poor core control in posture so core exercises were added to HEP.    PT Treatment/Interventions  ADLs/Self Care Home Management;Cryotherapy;Electrical Stimulation;Iontophoresis 4mg /ml Dexamethasone;Moist Heat;Traction;Ultrasound;Therapeutic activities;Therapeutic exercise;Neuromuscular re-education;Manual techniques;Passive range of motion;Dry needling;Taping;Spinal Manipulations;Joint Manipulations    PT Next Visit Plan  review HEP, Dry needling, needs stretching and ROM for scapular  thoracic, consider modalaties for pain, needs Lt scapular stability, needs pain management techniques Pt told that most needling is for 6 x only and pain should be managed with pain management techniques and HEP    PT Home Exercise Plan  assess next time when time allows, she should have some HEP from earlier in the year that she reports she is not really doing Review HEP next visit, roundback, long leg sit up    Consulted and Agree with Plan of Care  Patient       Patient will benefit from skilled therapeutic intervention in order to improve the following deficits and impairments:  Decreased activity tolerance, Decreased range of motion, Decreased strength, Increased fascial restricitons, Increased muscle spasms, Impaired flexibility, Pain  Visit Diagnosis: Cervicalgia  Abnormal posture  Muscle weakness (generalized)     Problem List Patient Active Problem List   Diagnosis Date Noted  . Tinea manuum 05/21/2019  . Abdominal wall lump 02/21/2018  . Chronic fatigue syndrome with fibromyalgia 02/24/2017  . Skin lesion of left leg 02/10/2017  . History of abnormal cervical Pap smear 02/10/2017  . Anxiety 10/15/2014  . Depression 10/15/2014  . Migraine 10/15/2014  . Chronic pain 10/15/2014  . Low TSH level 10/15/2014  . Tobacco use disorder 10/15/2014    Jashanti Clinkscale C. Geneive Sandstrom PT, DPT 07/23/19 3:46 PM   [facility] [address] [place], [place], [place] Phone: [phone]   Fax:  [phone]  Name: Kierstynn Babich MRN: Angeline Slim Date of Birth: 01-24-1976

## 2019-07-25 ENCOUNTER — Ambulatory Visit: Payer: Medicaid Other | Admitting: Physical Therapy

## 2019-08-01 ENCOUNTER — Other Ambulatory Visit: Payer: Self-pay | Admitting: Neurology

## 2019-08-07 ENCOUNTER — Other Ambulatory Visit: Payer: Self-pay

## 2019-08-07 ENCOUNTER — Ambulatory Visit: Payer: Medicaid Other | Attending: Neurology | Admitting: Physical Therapy

## 2019-08-07 DIAGNOSIS — R293 Abnormal posture: Secondary | ICD-10-CM | POA: Diagnosis present

## 2019-08-07 DIAGNOSIS — R279 Unspecified lack of coordination: Secondary | ICD-10-CM | POA: Insufficient documentation

## 2019-08-07 DIAGNOSIS — R252 Cramp and spasm: Secondary | ICD-10-CM | POA: Diagnosis present

## 2019-08-07 DIAGNOSIS — M62838 Other muscle spasm: Secondary | ICD-10-CM | POA: Diagnosis present

## 2019-08-07 DIAGNOSIS — M6281 Muscle weakness (generalized): Secondary | ICD-10-CM | POA: Insufficient documentation

## 2019-08-07 DIAGNOSIS — M542 Cervicalgia: Secondary | ICD-10-CM | POA: Diagnosis present

## 2019-08-07 NOTE — Therapy (Signed)
[facility] [address] [place], [place], [address] Phone: [phone]   Fax:  [phone]  Physical Therapy Treatment  Patient Details  Name: Gail Blackburn MRN: [id number] Date of Birth: [birth year] Referring Provider (PT): Lucia Gaskins, MD   Encounter Date: 08/07/2019  PT End of Session - 08/07/19 1135    Visit Number  9    Number of Visits  16    Date for PT Re-Evaluation  08/23/19    Authorization Type  MCD    Authorization Time Period  10/12-11/22    Authorization - Visit Number  2    Authorization - Number of Visits  12    PT Start Time  1135    PT Stop Time  1215    PT Time Calculation (min)  40 min    Activity Tolerance  Patient tolerated treatment well    Behavior During Therapy  [facility] for tasks assessed/performed       Past Medical History:  Diagnosis Date  . Anemia    PRIOR HISTORY  . Anxiety   . Asthma   . Bipolar disorder ([facility])    Borderline  . Depression   . Fibromyalgia   . Headache    MIGRAINES  . Hypothyroidism 2009   TOOK MEDS FOR FEW WEEKS NO MEDS NOW  . Migraine   . Thyroid disease    treated in past not sure if too high or too low    Past Surgical History:  Procedure Laterality Date  . COLONOSCOPY    . DILATION AND EVACUATION N/A 05/06/2016   Procedure: DILATATION AND EVACUATION;  Surgeon: Huel Cote, MD;  Location: [facility];  Service: Gynecology;  Laterality: N/A;  . FOOT SURGERY    . TEETH PULLED  03/2016  . WISDOM TOOTH EXTRACTION      There were no vitals filed for this visit.  Subjective Assessment - 08/07/19 1137    Subjective  Pt reports today she is a more sore today than she has been, botox and DN help.  She states that stress is the biggest factor in her pain    Currently in Pain?  Yes    Pain Score  5     Pain Location  Neck    Pain Orientation  Left    Pain Descriptors / Indicators  Tightness    Pain Type  Chronic pain    Aggravating Factors   stress    Pain Relieving  Factors  meds, DN                       [facility] Adult PT Treatment/Exercise - 08/07/19 0001      Self-Care   Other Self-Care Comments   verbally reviewed HEP - new ones for core      Manual Therapy   Manual therapy comments  skilled palpation and monitorinmg during DN    Soft tissue mobilization  STM to Lt upper traps, levators cervical and upper thoracic mul      Neck Exercises: Stretches   Other Neck Stretches  thoracic openers over soft bolster witharms overhead and in T     Other Neck Stretches  shoulder stretches overhead and flowing in ER/IR        Trigger Point Dry Needling - 08/07/19 0001    Consent Given?  Yes    Education Handout Provided  Previously provided    Muscles Treated Head and Neck  Upper trapezius;Levator scapulae    Electrical Stimulation Performed  with Dry Needling  Yes    Upper Trapezius Response  Palpable increased muscle length;Twitch reponse elicited   LT with stim   Levator Scapulae Response  Twitch response elicited;Palpable increased muscle length   Lt with stim   Cervical multifidi Response  Palpable increased muscle length;Twitch reponse elicited   Lt with stim            PT Short Term Goals - 07/18/19 1526      PT SHORT TERM GOAL #1   Title  Patient will be indepdnent with HEP     Status  Achieved      PT SHORT TERM GOAL #2   Title  Neck pain decr 15-20%     Baseline  pain has moved outof upper traps and central to suboccipitals/upper cervical    Status  Achieved        PT Long Term Goals - 08/07/19 1157      PT LONG TERM GOAL #1   Title  Pt will report cervical pain <= 3/10 with daily activities    Target Date  08/23/19      PT LONG TERM GOAL #2   Title  Pt will improve bilat shoulder ER strength to at least 5-/5 MMT to improve function    Target Date  08/23/19      PT LONG TERM GOAL #3   Title  Pt will improve neck ROM to WNL to improve funciton.    Target Date  08/23/19      PT LONG TERM GOAL #4    Title  reports she is able to urinate without pushing    Target Date  08/23/19      PT LONG TERM GOAL #5   Title  Pt will report cervical pain <= 2/10 with daily activities    Target Date  08/23/19            Plan - 08/07/19 1209    Clinical Impression Statement  Pt has not been in for two weeks, was doing better however Lt cervical pain and tightness returned. She self reports that stresss is a big trigger for her - we discussed using meditation, deep breathing to manage this.  She does have good releases with DN and manual work.    Rehab Potential  Good    PT Frequency  2x / week    PT Duration  4 weeks    PT Treatment/Interventions  ADLs/Self Care Home Management;Cryotherapy;Electrical Stimulation;Iontophoresis 4mg /ml Dexamethasone;Moist Heat;Traction;Ultrasound;Therapeutic activities;Therapeutic exercise;Neuromuscular re-education;Manual techniques;Passive range of motion;Dry needling;Taping;Spinal Manipulations;Joint Manipulations    PT Next Visit Plan  review HEP, Dry needling, needs stretching and ROM for scapular thoracic, consider modalaties for pain, needs Lt scapular stability, needs pain management techniques Pt told that most needling is for 6 x only and pain should be managed with pain management techniques and HEP       Patient will benefit from skilled therapeutic intervention in order to improve the following deficits and impairments:  Decreased activity tolerance, Decreased range of motion, Decreased strength, Increased fascial restricitons, Increased muscle spasms, Impaired flexibility, Pain  Visit Diagnosis: Cervicalgia  Abnormal posture  Muscle weakness (generalized)  Cramp and spasm     Problem List Patient Active Problem List   Diagnosis Date Noted  . Tinea manuum 05/21/2019  . Abdominal wall lump 02/21/2018  . Chronic fatigue syndrome with fibromyalgia 02/24/2017  . Skin lesion of left leg 02/10/2017  . History of abnormal cervical Pap smear  02/10/2017  . Anxiety 10/15/2014  .  Depression 10/15/2014  . Migraine 10/15/2014  . Chronic pain 10/15/2014  . Low TSH level 10/15/2014  . Tobacco use disorder 10/15/2014    Jeral Pinch PT  08/07/2019, 12:15 PM  [facility] [address] [place], [place], [address] Phone: [phone]   Fax:  [phone]  Name: Gail Blackburn MRN: [id number] Date of Birth: [birth year]

## 2019-08-14 ENCOUNTER — Other Ambulatory Visit: Payer: Self-pay

## 2019-08-14 ENCOUNTER — Encounter: Payer: Self-pay | Admitting: Physical Therapy

## 2019-08-14 ENCOUNTER — Ambulatory Visit: Payer: Medicaid Other | Admitting: Physical Therapy

## 2019-08-14 DIAGNOSIS — M542 Cervicalgia: Secondary | ICD-10-CM | POA: Diagnosis not present

## 2019-08-14 DIAGNOSIS — R293 Abnormal posture: Secondary | ICD-10-CM

## 2019-08-14 DIAGNOSIS — M6281 Muscle weakness (generalized): Secondary | ICD-10-CM

## 2019-08-14 NOTE — Therapy (Signed)
[facility] [address] [place], [place], [address] Phone: [phone]   Fax:  [phone]  Physical Therapy Treatment  Patient Details  Name: Sheretha Shadd MRN: [id number] Date of Birth: [birth year] Referring Provider (PT): Lucia Gaskins, MD   Encounter Date: 08/14/2019  PT End of Session - 08/14/19 1659    Visit Number  10    Number of Visits  16    Date for PT Re-Evaluation  08/23/19    Authorization Type  MCD    Authorization Time Period  10/12-11/22    Authorization - Visit Number  3    Authorization - Number of Visits  12    PT Start Time  1633    PT Stop Time  1701    PT Time Calculation (min)  28 min    Activity Tolerance  Patient tolerated treatment well    Behavior During Therapy  [facility] for tasks assessed/performed       Past Medical History:  Diagnosis Date  . Anemia    PRIOR HISTORY  . Anxiety   . Asthma   . Bipolar disorder (HCC)    Borderline  . Depression   . Fibromyalgia   . Headache    MIGRAINES  . Hypothyroidism 2009   TOOK MEDS FOR FEW WEEKS NO MEDS NOW  . Migraine   . Thyroid disease    treated in past not sure if too high or too low    Past Surgical History:  Procedure Laterality Date  . COLONOSCOPY    . DILATION AND EVACUATION N/A 05/06/2016   Procedure: DILATATION AND EVACUATION;  Surgeon: Huel Cote, MD;  Location: [facility];  Service: Gynecology;  Laterality: N/A;  . FOOT SURGERY    . TEETH PULLED  03/2016  . WISDOM TOOTH EXTRACTION      There were no vitals filed for this visit.  Subjective Assessment - 08/14/19 1634    Subjective  Lt periorbital and suboccipital pain with migraine today.    Currently in Pain?  Yes    Pain Location  Neck    Pain Descriptors / Indicators  [facility] PT Treatment/Exercise - 08/14/19 0001      Manual Therapy   Manual therapy comments  skilled palpation and monitorinmg during DN    Soft tissue  mobilization  Lt-sided cervical & suboccipital musculature into SCM and upper trap       Trigger Point Dry Needling - 08/14/19 0001    Muscles Treated Head and Neck  Sternocleidomastoid    Sternocleidomastoid Response  Twitch response elicited;Palpable increased muscle length    Oblique Capitus Response  Twitch response elicited;Palpable increased muscle length    Splenius capitus Response  Twitch reponse elicited;Palpable increased muscle length    Cervical multifidi Response  Twitch reponse elicited;Palpable increased muscle length           PT Education - 08/14/19 1710    Education Details  see Plan    Person(s) Educated  Patient    Methods  Explanation    Comprehension  Verbalized understanding       PT Short Term Goals - 07/18/19 1526      PT SHORT TERM GOAL #1   Title  Patient will be indepdnent with HEP     Status  Achieved      PT SHORT TERM GOAL #2   Title  Neck pain decr 15-20%     Baseline  pain has moved outof upper traps and central to suboccipitals/upper cervical    Status  Achieved        PT Long Term Goals - 08/07/19 1157      PT LONG TERM GOAL #1   Title  Pt will report cervical pain <= 3/10 with daily activities    Target Date  08/23/19      PT LONG TERM GOAL #2   Title  Pt will improve bilat shoulder ER strength to at least 5-/5 MMT to improve function    Target Date  08/23/19      PT LONG TERM GOAL #3   Title  Pt will improve neck ROM to WNL to improve funciton.    Target Date  08/23/19      PT LONG TERM GOAL #4   Title  reports she is able to urinate without pushing    Target Date  08/23/19      PT LONG TERM GOAL #5   Title  Pt will report cervical pain <= 2/10 with daily activities    Target Date  08/23/19            Plan - 08/14/19 1707    Clinical Impression Statement  Good tolerance to DN. Concordant periorbital pain upon palpation to Lt SCM, difficult to needle as she has a large vein at the surface but we were able to  decrease pain. Discussion held about decreasing stressors as well as decreasing smoking/caffeine intake to decrease cervical drive of respiratory movement.    PT Treatment/Interventions  ADLs/Self Care Home Management;Cryotherapy;Electrical Stimulation;Iontophoresis 4mg /ml Dexamethasone;Moist Heat;Traction;Ultrasound;Therapeutic activities;Therapeutic exercise;Neuromuscular re-education;Manual techniques;Passive range of motion;Dry needling;Taping;Spinal Manipulations;Joint Manipulations    PT Next Visit Plan  review HEP, Dry needling, needs stretching and ROM for scapular thoracic, consider modalaties for pain, needs Lt scapular stability, needs pain management techniques Pt told that most needling is for 6 x only and pain should be managed with pain management techniques and HEP    PT Home Exercise Plan  prior HEP, roundback, long leg sit up    Consulted and Agree with Plan of Care  Patient       Patient will benefit from skilled therapeutic intervention in order to improve the following deficits and impairments:  Decreased activity tolerance, Decreased range of motion, Decreased strength, Increased fascial restricitons, Increased muscle spasms, Impaired flexibility, Pain  Visit Diagnosis: Cervicalgia  Abnormal posture  Muscle weakness (generalized)     Problem List Patient Active Problem List   Diagnosis Date Noted  . Tinea manuum 05/21/2019  . Abdominal wall lump 02/21/2018  . Chronic fatigue syndrome with fibromyalgia 02/24/2017  . Skin lesion of left leg 02/10/2017  . History of abnormal cervical Pap smear 02/10/2017  . Anxiety 10/15/2014  . Depression 10/15/2014  . Migraine 10/15/2014  . Chronic pain 10/15/2014  . Low TSH level 10/15/2014  . Tobacco use disorder 10/15/2014    Hesham Womac C. Camyah Pultz PT, DPT 08/14/19 5:11 PM   [facility] [address] [place], [place], [address] Phone: [phone]   Fax:   [phone]  Name: Yaeko Fazekas MRN: [id number] Date of Birth: 07-11-1976

## 2019-08-16 ENCOUNTER — Other Ambulatory Visit: Payer: Self-pay

## 2019-08-16 ENCOUNTER — Ambulatory Visit: Payer: Medicaid Other | Admitting: Physical Therapy

## 2019-08-16 DIAGNOSIS — R252 Cramp and spasm: Secondary | ICD-10-CM

## 2019-08-16 DIAGNOSIS — M62838 Other muscle spasm: Secondary | ICD-10-CM

## 2019-08-16 DIAGNOSIS — M542 Cervicalgia: Secondary | ICD-10-CM

## 2019-08-16 DIAGNOSIS — M6281 Muscle weakness (generalized): Secondary | ICD-10-CM

## 2019-08-16 DIAGNOSIS — R279 Unspecified lack of coordination: Secondary | ICD-10-CM

## 2019-08-16 DIAGNOSIS — R293 Abnormal posture: Secondary | ICD-10-CM

## 2019-08-16 NOTE — Therapy (Addendum)
[place], [place], [address] Phone: [phone]   Fax:  [phone]  Physical Therapy Treatment/Discharge Note  Patient Details  Name: Gail Blackburn MRN: [id number] Date of Birth: 08-17-1976 Referring Provider (PT): Jaynee Eagles, MD   Encounter Date: 08/16/2019  PT End of Session - 08/16/19 0936    Visit Number  11    Number of Visits  16    Date for PT Re-Evaluation  08/23/19    Authorization Type  MCD    PT Start Time  0932    PT Stop Time  1026    PT Time Calculation (min)  54 min    Activity Tolerance  Patient tolerated treatment well    Behavior During Therapy  [facility] for tasks assessed/performed       Past Medical History:  Diagnosis Date  . Anemia    PRIOR HISTORY  . Anxiety   . Asthma   . Bipolar disorder ([place])    Borderline  . Depression   . Fibromyalgia   . Headache    MIGRAINES  . Hypothyroidism 2009   TOOK MEDS FOR FEW WEEKS NO MEDS NOW  . Migraine   . Thyroid disease    treated in past not sure if too high or too low    Past Surgical History:  Procedure Laterality Date  . COLONOSCOPY    . DILATION AND EVACUATION N/A 05/06/2016   Procedure: DILATATION AND EVACUATION;  Surgeon: Paula Compton, MD;  Location: [facility];  Service: Gynecology;  Laterality: N/A;  . FOOT SURGERY    . TEETH PULLED  03/2016  . WISDOM TOOTH EXTRACTION      There were no vitals filed for this visit.  Subjective Assessment - 08/16/19 0941    Subjective  I still get pain but today in the morning I am a 3/10  but rising to a 5/10.    Patient Stated Goals  Reduce pain and lose tension in my neck,  and prevent migraines    Currently in Pain?  Yes    Pain Score  3     Pain Orientation  Left    Pain Descriptors / Indicators  Throbbing;Sharp    Pain Type  Chronic pain    Pain Onset  More than a month ago    Pain Frequency  Intermittent         [facility] PT Assessment - 08/16/19 0001      Assessment    Medical Diagnosis  Cervicalgia    Referring Provider (PT)  Jaynee Eagles, MD      Sensation   Additional Comments  occasional numbness in Lt arm      Posture/Postural Control   Posture Comments  Pt incorporates posture into daily life to decrease numbness      ROM / Strength   AROM / PROM / Strength  AROM;Strength      AROM   Cervical Flexion  40    Cervical Extension  40    Cervical - Right Side Bend  28    Cervical - Left Side Bend  30    Cervical - Right Rotation  58    Cervical - Left Rotation  54      Strength   Overall Strength Comments   UE strength overall 5/5 MMT grossly except shoulder ER 5-/5 MMT bilat      Palpation   Palpation comment  TTp suboccipitals, tightness TMJ  [facility] Adult PT Treatment/Exercise - 08/16/19 0001      Self-Care   Other Self-Care Comments   verbally reviewed and finalized HEP for home use, discussed benefits of pelvic floor PT for future PT intervention      Moist Heat Therapy   Number Minutes Moist Heat  15 Minutes    Moist Heat Location  Cervical   thoracic     Manual Therapy   Manual therapy comments  skilled palpation and monitorinmg during DN    Soft tissue mobilization  bil cervical paraspinals/ supboccipital RT scalene, SCM       Trigger Point Dry Needling - 08/16/19 0001    Consent Given?  Yes    Education Handout Provided  Previously provided    Muscles Treated Head and Neck  Sternocleidomastoid    Muscles Treated Upper Quadrant  Rhomboids    Dry Needling Comments  33m 30 mm    Other Dry Needling  sub occipitals bil    Sternocleidomastoid Response  Twitch response elicited;Palpable increased muscle length   RT avoiding large peripheral vein   Upper Trapezius Response  Twitch reponse elicited;Palpable increased muscle length   bil   Oblique Capitus Response  Palpable increased muscle length   bil   Scalenes Response  Twitch reponse elicited;Palpable increased muscle length   RT   Splenius capitus  Response  Twitch reponse elicited;Palpable increased muscle length    Cervical multifidi Response  Twitch reponse elicited;Palpable increased muscle length    Rhomboids Response  Twitch response elicited;Palpable increased muscle length   bil            PT Short Term Goals - 07/18/19 1526      PT SHORT TERM GOAL #1   Title  Patient will be indepdnent with HEP     Status  Achieved      PT SHORT TERM GOAL #2   Title  Neck pain decr 15-20%     Baseline  pain has moved outof upper traps and central to suboccipitals/upper cervical    Status  Achieved        PT Long Term Goals - 08/16/19 0940      PT LONG TERM GOAL #1   Title  Pt will report cervical pain <= 3/10 with daily activities    Baseline  3/10 now and up to 5/10 at times.  sometimes feels tight    Period  Weeks    Status  Partially Met      PT LONG TERM GOAL #2   Title  Pt will improve bilat shoulder ER strength to at least 5-/5 MMT to improve function    Baseline  5-/5 bil  ER and all else 5/5    Time  6    Period  Weeks    Status  Achieved      PT LONG TERM GOAL #3   Title  Pt will improve neck ROM to WNL to improve funciton.    Baseline  Pt WNL  See Flowchart    Time  6    Period  Weeks    Status  Achieved      PT LONG TERM GOAL #4   Title  reports she is able to urinate without pushing    Baseline  reports maximum bearing down to empty bladder , Pt will return to PT to pelvic floor PT in future    Time  12    Period  Weeks    Status  Not Met  PT LONG TERM GOAL #5   Title  Pt will report cervical pain <= 2/10 with daily activities    Baseline  3/10  stress triggers and covid has not helped  more tightness than pain but when irritated quickly escalates,  using meditation and self care techniques / medication    Time  6    Period  Weeks    Status  Partially Met            Plan - 08/16/19 1326    Clinical Impression Statement  Pt returns for TPDN and is closely monitored throughout  session.  Pt has WNL AROM of cervical and grossly 5/5 UE strength and ER 5-/5.  Ms Mulhall still has concerns about Pelvic floor weakness and she is going to pursue Pelvic floor PT in the coming weeks.  Pt is independent with HEP. all LTG's met or partially met. except for pelvic floor goal for which she is pursuing care. Ms Drakes was 3/10 today and at times is as hight as 5/10.  Pt will continue with HEP at home and is ready for DC today after 5 TPDN sessions.    Personal Factors and Comorbidities  Past/Current Experience;Comorbidity 1;Comorbidity 2;Comorbidity 3+    Comorbidities  migranes, fibromyalgia, anx, bipolar    Examination-Activity Limitations  Sleep;Carry;Reach Overhead    Examination-Participation Restrictions  Driving;Laundry    Rehab Potential  Good    PT Frequency  2x / week    PT Duration  4 weeks    PT Treatment/Interventions  ADLs/Self Care Home Management;Cryotherapy;Electrical Stimulation;Iontophoresis 35m/ml Dexamethasone;Moist Heat;Traction;Ultrasound;Therapeutic activities;Therapeutic exercise;Neuromuscular re-education;Manual techniques;Passive range of motion;Dry needling;Taping;Spinal Manipulations;Joint Manipulations    PT Next Visit Plan  DC from ortho and will pursue help for pelvic floor problems.  Pt has appt with MD    PT Home Exercise Plan  prior HEP, roundback, long leg sit up    Consulted and Agree with Plan of Care  Patient       Patient will benefit from skilled therapeutic intervention in order to improve the following deficits and impairments:  Decreased activity tolerance, Decreased range of motion, Decreased strength, Increased fascial restricitons, Increased muscle spasms, Impaired flexibility, Pain  Visit Diagnosis: Cervicalgia  Abnormal posture  Muscle weakness (generalized)  Cramp and spasm  Unspecified lack of coordination  Other muscle spasm     Problem List Patient Active Problem List   Diagnosis Date Noted  . Tinea manuum 05/21/2019   . Abdominal wall lump 02/21/2018  . Chronic fatigue syndrome with fibromyalgia 02/24/2017  . Skin lesion of left leg 02/10/2017  . History of abnormal cervical Pap smear 02/10/2017  . Anxiety 10/15/2014  . Depression 10/15/2014  . Migraine 10/15/2014  . Chronic pain 10/15/2014  . Low TSH level 10/15/2014  . Tobacco use disorder 10/15/2014    LVoncille Lo PT Certified Exercise Expert for the Aging Adult  08/16/19 1:39 PM Phone: [phone]: 3[facility] [address]Phone: [phone]  Fax:  [phone] Name: ALousie CalicoMRN: [id number] of Birth: 104/16/1977

## 2019-08-29 ENCOUNTER — Other Ambulatory Visit: Payer: Self-pay | Admitting: Neurology

## 2019-09-02 DIAGNOSIS — Z79891 Long term (current) use of opiate analgesic: Secondary | ICD-10-CM | POA: Diagnosis not present

## 2019-09-11 ENCOUNTER — Other Ambulatory Visit: Payer: Self-pay | Admitting: Neurology

## 2019-09-17 ENCOUNTER — Encounter: Payer: Self-pay | Admitting: Family Medicine

## 2019-09-17 ENCOUNTER — Other Ambulatory Visit: Payer: Self-pay

## 2019-09-17 ENCOUNTER — Ambulatory Visit (INDEPENDENT_AMBULATORY_CARE_PROVIDER_SITE_OTHER): Payer: Medicaid Other | Admitting: Family Medicine

## 2019-09-17 DIAGNOSIS — G43709 Chronic migraine without aura, not intractable, without status migrainosus: Secondary | ICD-10-CM | POA: Diagnosis not present

## 2019-09-17 NOTE — Progress Notes (Signed)
Botox- 100 units x 4 vials Lot: [id number] Expiration: 10/2021 NDC: 7207-2182-88  Bacteriostatic 0.9% Sodium Chloride- 45mL total Lot: [id number] Expiration: 12/26/2019 NDC: 1460-4799-87  Dx: A15.872 S/P

## 2019-09-17 NOTE — Progress Notes (Signed)
Consent Form Botulism Toxin Injection For Chronic Migraine    Reviewed orally with patient, additionally signature is on file:  Botulism toxin has been approved by the Federal drug administration for treatment of chronic migraine. Botulism toxin does not cure chronic migraine and it may not be effective in some patients.  The administration of botulism toxin is accomplished by injecting a small amount of toxin into the muscles of the neck and head. Dosage must be titrated for each individual. Any benefits resulting from botulism toxin tend to wear off after 3 months with a repeat injection required if benefit is to be maintained. Injections are usually done every 3-4 months with maximum effect peak achieved by about 2 or 3 weeks. Botulism toxin is expensive and you should be sure of what costs you will incur resulting from the injection.  The side effects of botulism toxin use for chronic migraine may include:   -Transient, and usually mild, facial weakness with facial injections  -Transient, and usually mild, head or neck weakness with head/neck injections  -Reduction or loss of forehead facial animation due to forehead muscle weakness  -Eyelid drooping  -Dry eye  -Pain at the site of injection or bruising at the site of injection  -Double vision  -Potential unknown long term risks   Contraindications: You should not have Botox if you are pregnant, nursing, allergic to albumin, have an infection, skin condition, or muscle weakness at the site of the injection, or have myasthenia gravis, Lambert-Eaton syndrome, or ALS.  It is also possible that as with any injection, there may be an allergic reaction or no effect from the medication. Reduced effectiveness after repeated injections is sometimes seen and rarely infection at the injection site may occur. All care will be taken to prevent these side effects. If therapy is given over a long time, atrophy and wasting in the muscle injected may  occur. Occasionally the patient's become refractory to treatment because they develop antibodies to the toxin. In this event, therapy needs to be modified.  I have read the above information and consent to the administration of botulism toxin.    BOTOX PROCEDURE NOTE FOR MIGRAINE HEADACHE  Contraindications and precautions discussed with patient(above). Aseptic procedure was observed and patient tolerated procedure. Procedure performed by Debbora Presto, FNP-C.   The condition has existed for more than 6 months, and pt does not have a diagnosis of ALS, Myasthenia Gravis or Lambert-Eaton Syndrome.  Risks and benefits of injections discussed and pt agrees to proceed with the procedure.  Written consent obtained  These injections are medically necessary. Pt  receives good benefits from these injections. These injections do not cause sedations or hallucinations which the oral therapies may cause.   Description of procedure:  The patient was placed in a sitting position. The standard protocol was used for Botox as follows, with 5 units of Botox injected at each site:  -Procerus muscle, midline injection  -Corrugator muscle, bilateral injection  -Frontalis muscle, bilateral injection, with 2 sites each side, medial injection was performed in the upper one third of the frontalis muscle, in the region vertical from the medial inferior edge of the superior orbital rim. The lateral injection was again in the upper one third of the forehead vertically above the lateral limbus of the cornea, 1.5 cm lateral to the medial injection site.  -Temporalis muscle injection, 4 sites, bilaterally. The first injection was 3 cm above the tragus of the ear, second injection site was 1.5 cm to  3 cm up from the first injection site in line with the tragus of the ear. The third injection site was 1.5-3 cm forward between the first 2 injection sites. The fourth injection site was 1.5 cm posterior to the second injection  site. 5th site laterally in the temporalis  muscleat the level of the outer canthus.  -Occipitalis muscle injection, 3 sites, bilaterally. The first injection was done one half way between the occipital protuberance and the tip of the mastoid process behind the ear. The second injection site was done lateral and superior to the first, 1 fingerbreadth from the first injection. The third injection site was 1 fingerbreadth superiorly and medially from the first injection site.  -Cervical paraspinal muscle injection, 2 sites, bilateral knee first injection site was 1 cm from the midline of the cervical spine, 3 cm inferior to the lower border of the occipital protuberance. The second injection site was 1.5 cm superiorly and laterally to the first injection site.  -Trapezius muscle injection was performed at 3 sites, bilaterally. The first injection site was in the upper trapezius muscle halfway between the inflection point of the neck, and the acromion. The second injection site was one half way between the acromion and the first injection site. The third injection was done between the first injection site and the inflection point of the neck.  - Levator Scapulae: 5 units bilaterally  - Patient feels her clenching is a trigger for headaches. +5 units masseter bilaterally   - Patient feels the migraines are centered around the eyes +5 units bilaterally at the outer canthus in the orbicularis occuli   Will return for repeat injection in 3 months.   A 175 units of Botox was used, any Botox not injected was wasted. The patient tolerated the procedure well, there were no complications of the above procedure.

## 2019-09-30 ENCOUNTER — Other Ambulatory Visit: Payer: Self-pay | Admitting: Neurology

## 2019-10-06 ENCOUNTER — Other Ambulatory Visit: Payer: Self-pay | Admitting: Neurology

## 2019-10-16 ENCOUNTER — Telehealth: Payer: Self-pay

## 2019-10-16 NOTE — Telephone Encounter (Signed)
Patient called wanting to know if she can be referred back to [facility] for dry needling for her migraines. Patient states she was discharged from [facility] and is not sure why.  Please follow up

## 2019-10-23 NOTE — Telephone Encounter (Signed)
I called pt and she states she was confused as she has issues with her bladder.  The dry needling is for her migraines.   Please advise if ok to resume, may need new order.

## 2019-10-23 NOTE — Telephone Encounter (Signed)
Can you guide me on recommended length of treatment for dry needling. She told me at her last Botox procedure that PT had discharged her the end of November. She had 8 DN treatments and PT notes state with each visit that recommended treatment is 6 sessions. She was seen for dry needling in March 2020 as well. Can we send her back? Any other recommendations? I am happy to send her back but concerned that treatments are only temporarily beneficial and sessions are limited.

## 2019-10-23 NOTE — Telephone Encounter (Signed)
Please let her know that I talked with Dr Lucia Gaskins. We are not aware of any contraindication to long term dry needling therapy, however, her insurance and PT dictate how many sessions she can have. Dr Lucia Gaskins recommended she consider [facility] but this will be an out of pocket expense. May be around $100 for 30 minute session. Alternatively, she can call her insurance and ask about dry needling and how often they will allow her to go.

## 2019-10-23 NOTE — Telephone Encounter (Signed)
I'm not aware of any contraindications to long term therapy. Alternatively I hear the owner of [facility] is awesome at dry needling if she doesn't mind paying out of pocket I think it is about $100 for a 30-minute dry needling session and a 30 minute massage. Thanks!

## 2019-10-24 NOTE — Telephone Encounter (Signed)
I called pt and relayed that her AL/NP and AA/MD reviewing your chart, dry needling is a therapy that is limited pre insurance.  She may investigate with her insurance to see if this is a option still, if so she will call us back.  (I relayed that per her PT in epic [facility]), the PT stated that she was done).  I relayed that private pay [facility] is available if that is something she can afford.  We are seeing for migraines and she is getting BOTOX , if continued issues with neck will need to see pcp for referral to pain management.

## 2019-11-25 ENCOUNTER — Other Ambulatory Visit: Payer: Self-pay | Admitting: Neurology

## 2019-12-09 ENCOUNTER — Other Ambulatory Visit: Payer: Self-pay | Admitting: Neurology

## 2019-12-16 ENCOUNTER — Telehealth: Payer: Self-pay | Admitting: Family Medicine

## 2019-12-16 ENCOUNTER — Other Ambulatory Visit: Payer: Self-pay | Admitting: Neurology

## 2019-12-16 NOTE — Telephone Encounter (Signed)
Pt called to schedule next botox apt

## 2019-12-16 NOTE — Telephone Encounter (Signed)
Returned patient call to schedule next Botox appointment.  No answer.  Voicemail left requesting a call back.

## 2019-12-17 ENCOUNTER — Other Ambulatory Visit: Payer: Self-pay | Admitting: Neurology

## 2019-12-17 NOTE — Telephone Encounter (Signed)
Pt LVM requesting a CB to schedule next botox apt

## 2019-12-18 ENCOUNTER — Other Ambulatory Visit: Payer: Self-pay | Admitting: *Deleted

## 2019-12-18 MED ORDER — BOTOX 100 UNITS IJ SOLR: INTRAMUSCULAR | 1 refills | Status: DC

## 2019-12-19 ENCOUNTER — Other Ambulatory Visit: Payer: Self-pay

## 2019-12-19 ENCOUNTER — Ambulatory Visit (INDEPENDENT_AMBULATORY_CARE_PROVIDER_SITE_OTHER): Payer: Medicaid Other | Admitting: Family Medicine

## 2019-12-19 VITALS — BP 100/64 | HR 89 | Wt 143.6 lb

## 2019-12-19 DIAGNOSIS — N390 Urinary tract infection, site not specified: Secondary | ICD-10-CM | POA: Insufficient documentation

## 2019-12-19 DIAGNOSIS — M545 Low back pain, unspecified: Secondary | ICD-10-CM

## 2019-12-19 DIAGNOSIS — N3000 Acute cystitis without hematuria: Secondary | ICD-10-CM

## 2019-12-19 LAB — POCT UA - MICROSCOPIC ONLY

## 2019-12-19 LAB — POCT URINE PREGNANCY: Preg Test, Ur: NEGATIVE

## 2019-12-19 MED ORDER — CEPHALEXIN 500 MG PO CAPS: 500.0000 mg | ORAL_CAPSULE | Freq: Four times a day (QID) | ORAL | 0 refills | Status: AC

## 2019-12-19 NOTE — Patient Instructions (Signed)
Urinary Tract Infection, Adult A urinary tract infection (UTI) is an infection of any part of the urinary tract. The urinary tract includes:  The kidneys.  The ureters.  The bladder.  The urethra. These organs make, store, and get rid of pee (urine) in the body. What are the causes? This is caused by germs (bacteria) in your genital area. These germs grow and cause swelling (inflammation) of your urinary tract. What increases the risk? You are more likely to develop this condition if:  You have a small, thin tube (catheter) to drain pee.  You cannot control when you pee or poop (incontinence).  You are female, and: ? You use these methods to prevent pregnancy:  A medicine that kills sperm (spermicide).  A device that blocks sperm (diaphragm). ? You have low levels of a female hormone (estrogen). ? You are pregnant.  You have genes that add to your risk.  You are sexually active.  You take antibiotic medicines.  You have trouble peeing because of: ? A prostate that is bigger than normal, if you are female. ? A blockage in the part of your body that drains pee from the bladder (urethra). ? A kidney stone. ? A nerve condition that affects your bladder (neurogenic bladder). ? Not getting enough to drink. ? Not peeing often enough.  You have other conditions, such as: ? Diabetes. ? A weak disease-fighting system (immune system). ? Sickle cell disease. ? Gout. ? Injury of the spine. What are the signs or symptoms? Symptoms of this condition include:  Needing to pee right away (urgently).  Peeing often.  Peeing small amounts often.  Pain or burning when peeing.  Blood in the pee.  Pee that smells bad or not like normal.  Trouble peeing.  Pee that is cloudy.  Fluid coming from the vagina, if you are female.  Pain in the belly or lower back. Other symptoms include:  Throwing up (vomiting).  No urge to eat.  Feeling mixed up (confused).  Being tired  and grouchy (irritable).  A fever.  Watery poop (diarrhea). How is this treated? This condition may be treated with:  Antibiotic medicine.  Other medicines.  Drinking enough water. Follow these instructions at home:  Medicines  Take over-the-counter and prescription medicines only as told by your doctor.  If you were prescribed an antibiotic medicine, take it as told by your doctor. Do not stop taking it even if you start to feel better. General instructions  Make sure you: ? Pee until your bladder is empty. ? Do not hold pee for a long time. ? Empty your bladder after sex. ? Wipe from front to back after pooping if you are a female. Use each tissue one time when you wipe.  Drink enough fluid to keep your pee pale yellow.  Keep all follow-up visits as told by your doctor. This is important. Contact a doctor if:  You do not get better after 1-2 days.  Your symptoms go away and then come back. Get help right away if:  You have very bad back pain.  You have very bad pain in your lower belly.  You have a fever.  You are sick to your stomach (nauseous).  You are throwing up. Summary  A urinary tract infection (UTI) is an infection of any part of the urinary tract.  This condition is caused by germs in your genital area.  There are many risk factors for a UTI. These include having a small, thin   tube to drain pee and not being able to control when you pee or poop.  Treatment includes antibiotic medicines for germs.  Drink enough fluid to keep your pee pale yellow. This information is not intended to replace advice given to you by your health care provider. Make sure you discuss any questions you have with your health care provider. Document Revised: 08/30/2018 Document Reviewed: 03/22/2018 Elsevier Patient Education  2020 Elsevier Inc.  

## 2019-12-19 NOTE — Progress Notes (Signed)
    SUBJECTIVE:   CHIEF COMPLAINT / HPI:   Low back pain/flank pain Patient states she feels like she has a "UTI" with associated low back pain/flank pain. She has had this pain before about 10 years ago, went to the doctor and was found to have a UTI, got antibiotics, and her symptoms resolved. She has urinary frequency, urgency, and dysuria. She denies fever, chills, nausea, vomiting, or diarrhea. She has not seen any blood in her urine. The associated low back/flank pain started just a day or two after her dysuria started. Tylenol has provided little help, she has started taking Azo, and she is using a heating pad which does provide some relief for her back pain.  PERTINENT  PMH / PSH: Anxiety, Depression  OBJECTIVE:   BP 100/64   Pulse 89   Wt 143 lb 9.6 oz (65.1 kg)   LMP 11/27/2019 (Approximate)   SpO2 98%   BMI 23.53 kg/m   Gen: Alert and Oriented x 3, NAD CV: RRR, no murmurs, normal S1, S2 split Resp: CTAB, no wheezing, rales, or rhonchi, comfortable work of breathing GU: + Bilateral CVA tenderness Ext: no clubbing, cyanosis, or edema Skin: warm, dry, intact, no rashes  ASSESSMENT/PLAN:   UTI (urinary tract infection) Patient with multiple symptoms consistent with UTI, unable to perform U/A due to her taking Azo according to lab however, urine microscopy showed no RBCs so unlikely to be a kidney stone. Urine Culture shows E. Coli >100k - I had started patient on Keflex 500mg  QID for 5 days, complete course - F/u as needed or if symptoms worsen     , DO [facility]

## 2019-12-20 LAB — CBC WITH DIFFERENTIAL/PLATELET
Basophils Absolute: 0.1 10*3/uL (ref 0.0–0.2)
Basos: 1 %
EOS (ABSOLUTE): 0.5 10*3/uL — ABNORMAL HIGH (ref 0.0–0.4)
Eos: 6 %
Hematocrit: 38.8 % (ref 34.0–46.6)
Hemoglobin: 12.9 g/dL (ref 11.1–15.9)
Immature Grans (Abs): 0 10*3/uL (ref 0.0–0.1)
Immature Granulocytes: 0 %
Lymphocytes Absolute: 2.1 10*3/uL (ref 0.7–3.1)
Lymphs: 28 %
MCH: 32.7 pg (ref 26.6–33.0)
MCHC: 33.2 g/dL (ref 31.5–35.7)
MCV: 98 fL — ABNORMAL HIGH (ref 79–97)
Monocytes Absolute: 0.6 10*3/uL (ref 0.1–0.9)
Monocytes: 7 %
Neutrophils Absolute: 4.3 10*3/uL (ref 1.4–7.0)
Neutrophils: 58 %
Platelets: 259 10*3/uL (ref 150–450)
RBC: 3.95 x10E6/uL (ref 3.77–5.28)
RDW: 12.4 % (ref 11.7–15.4)
WBC: 7.5 10*3/uL (ref 3.4–10.8)

## 2019-12-20 LAB — BASIC METABOLIC PANEL
BUN/Creatinine Ratio: 15 (ref 9–23)
BUN: 12 mg/dL (ref 6–24)
CO2: 20 mmol/L (ref 20–29)
Calcium: 9.5 mg/dL (ref 8.7–10.2)
Chloride: 106 mmol/L (ref 96–106)
Creatinine, Ser: 0.81 mg/dL (ref 0.57–1.00)
GFR calc Af Amer: 104 mL/min/{1.73_m2} (ref >59)
GFR calc non Af Amer: 90 mL/min/{1.73_m2} (ref >59)
Glucose: 85 mg/dL (ref 65–99)
Potassium: 4.1 mmol/L (ref 3.5–5.2)
Sodium: 140 mmol/L (ref 134–144)

## 2019-12-22 LAB — URINE CULTURE

## 2019-12-23 NOTE — Assessment & Plan Note (Signed)
Patient with multiple symptoms consistent with UTI, unable to perform U/A due to her taking Azo according to lab however, urine microscopy showed no RBCs so unlikely to be a kidney stone. Urine Culture shows E. Coli >100k - I had started patient on Keflex 500mg  QID for 5 days, complete course - F/u as needed or if symptoms worsen

## 2019-12-26 ENCOUNTER — Ambulatory Visit: Payer: Medicaid Other

## 2020-01-02 ENCOUNTER — Telehealth: Payer: Self-pay | Admitting: *Deleted

## 2020-01-02 NOTE — Telephone Encounter (Signed)
I called [facility] [phone] and spoke to Waite Rasmusson.  She states that there is no PA on file for her.  Must fill out the PA form and fax to [facility] [phone] with clinical notes.

## 2020-01-06 ENCOUNTER — Telehealth: Payer: Self-pay | Admitting: Family Medicine

## 2020-01-06 NOTE — Telephone Encounter (Signed)
I called [facility] [phone] and spoke to [place].  She states that Botox was approved.  PA# is [id number] Valid from 01/06/2020-12/31/2020.  Ref# for call is [id number].  I called [facility] [phone] and spoke to [place] to schedule medication delivery.  She took all information and once they have verified insurance they will call to schedule delivery.

## 2020-01-06 NOTE — Telephone Encounter (Signed)
1) Medication(s) Requested (by name): Did not name 2) Pharmacy of Choice:   [facility]   Rep with pharmacy LVM  to request a prescription refill but did not leave medication name states a verbal can be given at [phone] option 3

## 2020-01-08 NOTE — Telephone Encounter (Signed)
I called [facility] [phone] to try and schedule her Botox delivery.  Spoke to [place] who states they have reached out to patient but got no answer.  They left a message on 01/07/2020 requesting a call back.  I called patient and got no answer.  I  left a message requesting she call [facility] so we can schedule the medication delivery.

## 2020-01-08 NOTE — Telephone Encounter (Signed)
I called # left and not correct #.  I LMVM for pt for name of medication relating [facility].

## 2020-01-14 DIAGNOSIS — Z23 Encounter for immunization: Secondary | ICD-10-CM | POA: Diagnosis not present

## 2020-01-15 ENCOUNTER — Other Ambulatory Visit: Payer: Self-pay

## 2020-01-15 ENCOUNTER — Ambulatory Visit: Payer: Medicaid Other | Admitting: Family Medicine

## 2020-01-15 VITALS — Temp 97.0°F | Ht 65.5 in | Wt 142.6 lb

## 2020-01-15 DIAGNOSIS — G43709 Chronic migraine without aura, not intractable, without status migrainosus: Secondary | ICD-10-CM

## 2020-01-15 DIAGNOSIS — M542 Cervicalgia: Secondary | ICD-10-CM

## 2020-01-15 NOTE — Progress Notes (Signed)
 Consent Form Botulism Toxin Injection For Chronic Migraine    Reviewed orally with patient, additionally signature is on file:  Botulism toxin has been approved by the Federal drug administration for treatment of chronic migraine. Botulism toxin does not cure chronic migraine and it may not be effective in some patients.  The administration of botulism toxin is accomplished by injecting a small amount of toxin into the muscles of the neck and head. Dosage must be titrated for each individual. Any benefits resulting from botulism toxin tend to wear off after 3 months with a repeat injection required if benefit is to be maintained. Injections are usually done every 3-4 months with maximum effect peak achieved by about 2 or 3 weeks. Botulism toxin is expensive and you should be sure of what costs you will incur resulting from the injection.  The side effects of botulism toxin use for chronic migraine may include:   -Transient, and usually mild, facial weakness with facial injections  -Transient, and usually mild, head or neck weakness with head/neck injections  -Reduction or loss of forehead facial animation due to forehead muscle weakness  -Eyelid drooping  -Dry eye  -Pain at the site of injection or bruising at the site of injection  -Double vision  -Potential unknown long term risks   Contraindications: You should not have Botox if you are pregnant, nursing, allergic to albumin, have an infection, skin condition, or muscle weakness at the site of the injection, or have myasthenia gravis, Lambert-Eaton syndrome, or ALS.  It is also possible that as with any injection, there may be an allergic reaction or no effect from the medication. Reduced effectiveness after repeated injections is sometimes seen and rarely infection at the injection site may occur. All care will be taken to prevent these side effects. If therapy is given over a long time, atrophy and wasting in the muscle injected may  occur. Occasionally the patient's become refractory to treatment because they develop antibodies to the toxin. In this event, therapy needs to be modified.  I have read the above information and consent to the administration of botulism toxin.    BOTOX PROCEDURE NOTE FOR MIGRAINE HEADACHE  Contraindications and precautions discussed with patient(above). Aseptic procedure was observed and patient tolerated procedure. Procedure performed by Messi Twedt, FNP-C.   The condition has existed for more than 6 months, and pt does not have a diagnosis of ALS, Myasthenia Gravis or Lambert-Eaton Syndrome.  Risks and benefits of injections discussed and pt agrees to proceed with the procedure.  Written consent obtained  These injections are medically necessary. Pt  receives good benefits from these injections. These injections do not cause sedations or hallucinations which the oral therapies may cause.   Description of procedure:  The patient was placed in a sitting position. The standard protocol was used for Botox as follows, with 5 units of Botox injected at each site:  -Procerus muscle, midline injection  -Corrugator muscle, bilateral injection  -Frontalis muscle, bilateral injection, with 2 sites each side, medial injection was performed in the upper one third of the frontalis muscle, in the region vertical from the medial inferior edge of the superior orbital rim. The lateral injection was again in the upper one third of the forehead vertically above the lateral limbus of the cornea, 1.5 cm lateral to the medial injection site.  -Temporalis muscle injection, 4 sites, bilaterally. The first injection was 3 cm above the tragus of the ear, second injection site was 1.5 cm to   3 cm up from the first injection site in line with the tragus of the ear. The third injection site was 1.5-3 cm forward between the first 2 injection sites. The fourth injection site was 1.5 cm posterior to the second injection  site. 5th site laterally in the temporalis  muscleat the level of the outer canthus.  -Occipitalis muscle injection, 3 sites, bilaterally. The first injection was done one half way between the occipital protuberance and the tip of the mastoid process behind the ear. The second injection site was done lateral and superior to the first, 1 fingerbreadth from the first injection. The third injection site was 1 fingerbreadth superiorly and medially from the first injection site.  -Cervical paraspinal muscle injection, 2 sites, bilaterally. The first injection site was 1 cm from the midline of the cervical spine, 3 cm inferior to the lower border of the occipital protuberance. The second injection site was 1.5 cm superiorly and laterally to the first injection site.  -Trapezius muscle injection was performed at 3 sites, bilaterally. The first injection site was in the upper trapezius muscle halfway between the inflection point of the neck, and the acromion. The second injection site was one half way between the acromion and the first injection site. The third injection was done between the first injection site and the inflection point of the neck.   Will return for repeat injection in 3 months.   A total of 200 units of Botox was prepared, 155 units of Botox was injected as documented above, any Botox not injected was wasted. The patient tolerated the procedure well, there were no complications of the above procedure.   

## 2020-01-15 NOTE — Progress Notes (Signed)
Botox- 100 units x 2 vials Lot: [id number] Expiration: 03/2022 NDC: 0347-4259-56  Bacteriostatic 0.9% Sodium Chloride- 3mL total Lot: [id number] Expiration: 03/26/2020 NDC: 3329-5188-41  Dx: Y60.630 S/P

## 2020-01-16 ENCOUNTER — Other Ambulatory Visit: Payer: Self-pay | Admitting: Neurology

## 2020-01-21 DIAGNOSIS — Z79891 Long term (current) use of opiate analgesic: Secondary | ICD-10-CM | POA: Diagnosis not present

## 2020-02-01 ENCOUNTER — Other Ambulatory Visit: Payer: Self-pay | Admitting: Neurology

## 2020-02-05 DIAGNOSIS — M9901 Segmental and somatic dysfunction of cervical region: Secondary | ICD-10-CM | POA: Diagnosis not present

## 2020-02-05 DIAGNOSIS — M9903 Segmental and somatic dysfunction of lumbar region: Secondary | ICD-10-CM | POA: Diagnosis not present

## 2020-02-05 DIAGNOSIS — S39012A Strain of muscle, fascia and tendon of lower back, initial encounter: Secondary | ICD-10-CM | POA: Diagnosis not present

## 2020-02-05 DIAGNOSIS — M5414 Radiculopathy, thoracic region: Secondary | ICD-10-CM | POA: Diagnosis not present

## 2020-02-05 DIAGNOSIS — R519 Headache, unspecified: Secondary | ICD-10-CM | POA: Diagnosis not present

## 2020-02-05 DIAGNOSIS — M9902 Segmental and somatic dysfunction of thoracic region: Secondary | ICD-10-CM | POA: Diagnosis not present

## 2020-02-11 DIAGNOSIS — Z23 Encounter for immunization: Secondary | ICD-10-CM | POA: Diagnosis not present

## 2020-02-20 DIAGNOSIS — R519 Headache, unspecified: Secondary | ICD-10-CM | POA: Diagnosis not present

## 2020-02-20 DIAGNOSIS — M9903 Segmental and somatic dysfunction of lumbar region: Secondary | ICD-10-CM | POA: Diagnosis not present

## 2020-02-20 DIAGNOSIS — M5414 Radiculopathy, thoracic region: Secondary | ICD-10-CM | POA: Diagnosis not present

## 2020-02-20 DIAGNOSIS — M9902 Segmental and somatic dysfunction of thoracic region: Secondary | ICD-10-CM | POA: Diagnosis not present

## 2020-02-20 DIAGNOSIS — S39012A Strain of muscle, fascia and tendon of lower back, initial encounter: Secondary | ICD-10-CM | POA: Diagnosis not present

## 2020-02-20 DIAGNOSIS — M9901 Segmental and somatic dysfunction of cervical region: Secondary | ICD-10-CM | POA: Diagnosis not present

## 2020-02-23 ENCOUNTER — Other Ambulatory Visit: Payer: Self-pay | Admitting: Neurology

## 2020-02-25 ENCOUNTER — Other Ambulatory Visit: Payer: Self-pay | Admitting: Neurology

## 2020-02-26 DIAGNOSIS — M5414 Radiculopathy, thoracic region: Secondary | ICD-10-CM | POA: Diagnosis not present

## 2020-02-26 DIAGNOSIS — M9902 Segmental and somatic dysfunction of thoracic region: Secondary | ICD-10-CM | POA: Diagnosis not present

## 2020-02-26 DIAGNOSIS — M9903 Segmental and somatic dysfunction of lumbar region: Secondary | ICD-10-CM | POA: Diagnosis not present

## 2020-02-26 DIAGNOSIS — R519 Headache, unspecified: Secondary | ICD-10-CM | POA: Diagnosis not present

## 2020-02-26 DIAGNOSIS — S39012A Strain of muscle, fascia and tendon of lower back, initial encounter: Secondary | ICD-10-CM | POA: Diagnosis not present

## 2020-02-26 DIAGNOSIS — M9901 Segmental and somatic dysfunction of cervical region: Secondary | ICD-10-CM | POA: Diagnosis not present

## 2020-03-01 ENCOUNTER — Other Ambulatory Visit: Payer: Self-pay | Admitting: Neurology

## 2020-03-02 ENCOUNTER — Other Ambulatory Visit: Payer: Self-pay | Admitting: *Deleted

## 2020-03-05 ENCOUNTER — Other Ambulatory Visit: Payer: Self-pay | Admitting: *Deleted

## 2020-03-05 MED ORDER — PROPRANOLOL HCL ER 60 MG PO CP24: 60.0000 mg | ORAL_CAPSULE | Freq: Every day | ORAL | 0 refills | Status: DC

## 2020-03-08 ENCOUNTER — Other Ambulatory Visit: Payer: Self-pay | Admitting: Neurology

## 2020-03-11 DIAGNOSIS — M9903 Segmental and somatic dysfunction of lumbar region: Secondary | ICD-10-CM | POA: Diagnosis not present

## 2020-03-11 DIAGNOSIS — R519 Headache, unspecified: Secondary | ICD-10-CM | POA: Diagnosis not present

## 2020-03-11 DIAGNOSIS — M5414 Radiculopathy, thoracic region: Secondary | ICD-10-CM | POA: Diagnosis not present

## 2020-03-11 DIAGNOSIS — S39012A Strain of muscle, fascia and tendon of lower back, initial encounter: Secondary | ICD-10-CM | POA: Diagnosis not present

## 2020-03-11 DIAGNOSIS — M9901 Segmental and somatic dysfunction of cervical region: Secondary | ICD-10-CM | POA: Diagnosis not present

## 2020-03-11 DIAGNOSIS — M9902 Segmental and somatic dysfunction of thoracic region: Secondary | ICD-10-CM | POA: Diagnosis not present

## 2020-03-13 ENCOUNTER — Other Ambulatory Visit: Payer: Self-pay | Admitting: Neurology

## 2020-03-13 DIAGNOSIS — M5414 Radiculopathy, thoracic region: Secondary | ICD-10-CM | POA: Diagnosis not present

## 2020-03-13 DIAGNOSIS — S39012A Strain of muscle, fascia and tendon of lower back, initial encounter: Secondary | ICD-10-CM | POA: Diagnosis not present

## 2020-03-13 DIAGNOSIS — M9901 Segmental and somatic dysfunction of cervical region: Secondary | ICD-10-CM | POA: Diagnosis not present

## 2020-03-13 DIAGNOSIS — M9903 Segmental and somatic dysfunction of lumbar region: Secondary | ICD-10-CM | POA: Diagnosis not present

## 2020-03-13 DIAGNOSIS — R519 Headache, unspecified: Secondary | ICD-10-CM | POA: Diagnosis not present

## 2020-03-13 DIAGNOSIS — M9902 Segmental and somatic dysfunction of thoracic region: Secondary | ICD-10-CM | POA: Diagnosis not present

## 2020-03-23 DIAGNOSIS — S39012A Strain of muscle, fascia and tendon of lower back, initial encounter: Secondary | ICD-10-CM | POA: Diagnosis not present

## 2020-03-23 DIAGNOSIS — R519 Headache, unspecified: Secondary | ICD-10-CM | POA: Diagnosis not present

## 2020-03-23 DIAGNOSIS — M9903 Segmental and somatic dysfunction of lumbar region: Secondary | ICD-10-CM | POA: Diagnosis not present

## 2020-03-23 DIAGNOSIS — M9902 Segmental and somatic dysfunction of thoracic region: Secondary | ICD-10-CM | POA: Diagnosis not present

## 2020-03-23 DIAGNOSIS — M9901 Segmental and somatic dysfunction of cervical region: Secondary | ICD-10-CM | POA: Diagnosis not present

## 2020-03-23 DIAGNOSIS — M5414 Radiculopathy, thoracic region: Secondary | ICD-10-CM | POA: Diagnosis not present

## 2020-04-06 ENCOUNTER — Telehealth: Payer: Self-pay | Admitting: Family Medicine

## 2020-04-06 NOTE — Telephone Encounter (Addendum)
I called [name] [phone] and spoke with Archie Patten to see about scheduling delivery. She states that they have called the patient multiple times to get consent for shipment, but her phone gives a message that it does not take incoming calls. I attempted to call the patient on that number [phone]) and I also received the same message. I called patient's mother, Diane (DPR) and asked her about this. She gave me [phone]) to try. I called this number and was able to reach the patient. She states her phone is cut off and this current number is a text-free number she is using until her phone cuts back on. I advised her to call [name] as soon as possible to make sure her medication gets here on time for her 7/22 appointment. I gave her the number and she states she will call them immediately.  PA on file with Medicaid. PA #[id number] (01/06/2020-12/31/2020).

## 2020-04-06 NOTE — Telephone Encounter (Signed)
[name] called at 1:31pm and left a VM asking for a call to set up a botox shipment. Please call [phone].

## 2020-04-06 NOTE — Telephone Encounter (Signed)
I called [name] and spoke with [place]. Botox TBD 7/20 for 7/22 appointment.

## 2020-04-16 ENCOUNTER — Ambulatory Visit: Payer: Medicaid Other | Admitting: Family Medicine

## 2020-04-16 ENCOUNTER — Telehealth: Payer: Self-pay | Admitting: Family Medicine

## 2020-04-16 DIAGNOSIS — G43709 Chronic migraine without aura, not intractable, without status migrainosus: Secondary | ICD-10-CM | POA: Diagnosis not present

## 2020-04-16 MED ORDER — CELECOXIB 100 MG PO CAPS: 100.0000 mg | ORAL_CAPSULE | Freq: Every day | ORAL | 0 refills | Status: DC | PRN

## 2020-04-16 MED ORDER — RIZATRIPTAN BENZOATE 10 MG PO TBDP: ORAL_TABLET | ORAL | 11 refills | Status: DC

## 2020-04-16 MED ORDER — EMGALITY 120 MG/ML ~~LOC~~ SOAJ: 120.0000 mg | SUBCUTANEOUS | 11 refills | Status: DC

## 2020-04-16 MED ORDER — PROPRANOLOL HCL ER 60 MG PO CP24: 60.0000 mg | ORAL_CAPSULE | Freq: Every day | ORAL | 3 refills | Status: DC

## 2020-04-16 NOTE — Telephone Encounter (Signed)
I called and left patient a message about referral switched to [facility] [address].  And telephone number.

## 2020-04-16 NOTE — Telephone Encounter (Signed)
I called [name] because patient's medication has not yet been delivered. I spoke with [name], who states that there was a shipment delay and delivery had been rescheduled for today. Patient had an 8:30 appointment. Medication was used from office stock. When shipment arrives I will replace.

## 2020-04-16 NOTE — Progress Notes (Signed)
She is doing well. She continues Emgality monthly and propranolol ER 60mg  daily. Rizatriptan, Celebrex, flexeril and ondansetron used PRN for abortive therapy. Also taking gabapentin 800mg  TID and Effexor 150mg  daily prescribed by PCP.   Consent Form Botulism Toxin Injection For Chronic Migraine    Reviewed orally with patient, additionally signature is on file:  Botulism toxin has been approved by the Federal drug administration for treatment of chronic migraine. Botulism toxin does not cure chronic migraine and it may not be effective in some patients.  The administration of botulism toxin is accomplished by injecting a small amount of toxin into the muscles of the neck and head. Dosage must be titrated for each individual. Any benefits resulting from botulism toxin tend to wear off after 3 months with a repeat injection required if benefit is to be maintained. Injections are usually done every 3-4 months with maximum effect peak achieved by about 2 or 3 weeks. Botulism toxin is expensive and you should be sure of what costs you will incur resulting from the injection.  The side effects of botulism toxin use for chronic migraine may include:   -Transient, and usually mild, facial weakness with facial injections  -Transient, and usually mild, head or neck weakness with head/neck injections  -Reduction or loss of forehead facial animation due to forehead muscle weakness  -Eyelid drooping  -Dry eye  -Pain at the site of injection or bruising at the site of injection  -Double vision  -Potential unknown long term risks   Contraindications: You should not have Botox if you are pregnant, nursing, allergic to albumin, have an infection, skin condition, or muscle weakness at the site of the injection, or have myasthenia gravis, Lambert-Eaton syndrome, or ALS.  It is also possible that as with any injection, there may be an allergic reaction or no effect from the medication. Reduced  effectiveness after repeated injections is sometimes seen and rarely infection at the injection site may occur. All care will be taken to prevent these side effects. If therapy is given over a long time, atrophy and wasting in the muscle injected may occur. Occasionally the patient's become refractory to treatment because they develop antibodies to the toxin. In this event, therapy needs to be modified.  I have read the above information and consent to the administration of botulism toxin.    BOTOX PROCEDURE NOTE FOR MIGRAINE HEADACHE  Contraindications and precautions discussed with patient(above). Aseptic procedure was observed and patient tolerated procedure. Procedure performed by , FNP-C.   The condition has existed for more than 6 months, and pt does not have a diagnosis of ALS, Myasthenia Gravis or Lambert-Eaton Syndrome.  Risks and benefits of injections discussed and pt agrees to proceed with the procedure.  Written consent obtained  These injections are medically necessary. Pt  receives good benefits from these injections. These injections do not cause sedations or hallucinations which the oral therapies may cause.   Description of procedure:  The patient was placed in a sitting position. The standard protocol was used for Botox as follows, with 5 units of Botox injected at each site:  -Procerus muscle, midline injection  -Corrugator muscle, bilateral injection  -Frontalis muscle, bilateral injection, with 2 sites each side, medial injection was performed in the upper one third of the frontalis muscle, in the region vertical from the medial inferior edge of the superior orbital rim. The lateral injection was again in the upper one third of the forehead vertically above the lateral limbus  of the cornea, 1.5 cm lateral to the medial injection site.  -Temporalis muscle injection, 4 sites, bilaterally. The first injection was 3 cm above the tragus of the ear, second injection  site was 1.5 cm to 3 cm up from the first injection site in line with the tragus of the ear. The third injection site was 1.5-3 cm forward between the first 2 injection sites. The fourth injection site was 1.5 cm posterior to the second injection site. 5th site laterally in the temporalis  muscleat the level of the outer canthus.  -Occipitalis muscle injection, 3 sites, bilaterally. The first injection was done one half way between the occipital protuberance and the tip of the mastoid process behind the ear. The second injection site was done lateral and superior to the first, 1 fingerbreadth from the first injection. The third injection site was 1 fingerbreadth superiorly and medially from the first injection site.  -Cervical paraspinal muscle injection, 2 sites, bilaterally. The first injection site was 1 cm from the midline of the cervical spine, 3 cm inferior to the lower border of the occipital protuberance. The second injection site was 1.5 cm superiorly and laterally to the first injection site.  -Trapezius muscle injection was performed at 3 sites, bilaterally. The first injection site was in the upper trapezius muscle halfway between the inflection point of the neck, and the acromion. The second injection site was one half way between the acromion and the first injection site. The third injection was done between the first injection site and the inflection point of the neck.    Will return for repeat injection in 3 months.   A total of 200 units of Botox was prepared, 155 units of Botox was injected as documented above, any Botox not injected was wasted. The patient tolerated the procedure well, there were no complications of the above procedure.

## 2020-04-16 NOTE — Progress Notes (Signed)
Botox-200 unit sx1 vials Lot: [id number] Expiration: 12/2022 NDC: 2336-1224-49   0.9% Sodium Chloride- 62mL total Lot: [id number] Expiration: 06/2022 NDC: 10211-173-56  Dx: Chronic Migraine B/B   Consent signed

## 2020-04-20 NOTE — Telephone Encounter (Signed)
[place] with [name] called and stated that a replacement request had been created for patient's Botox on [facility]'s behalf, and we should be receiving the shipment today between 8- 4:30.

## 2020-04-22 ENCOUNTER — Other Ambulatory Visit: Payer: Self-pay | Admitting: Neurology

## 2020-04-23 DIAGNOSIS — H5213 Myopia, bilateral: Secondary | ICD-10-CM | POA: Diagnosis not present

## 2020-04-27 ENCOUNTER — Other Ambulatory Visit: Payer: Self-pay | Admitting: *Deleted

## 2020-04-27 ENCOUNTER — Telehealth: Payer: Self-pay | Admitting: Family Medicine

## 2020-04-27 DIAGNOSIS — G43709 Chronic migraine without aura, not intractable, without status migrainosus: Secondary | ICD-10-CM

## 2020-04-27 MED ORDER — CYCLOBENZAPRINE HCL 5 MG PO TABS: 5.0000 mg | ORAL_TABLET | Freq: Three times a day (TID) | ORAL | 5 refills | Status: DC | PRN

## 2020-04-27 NOTE — Telephone Encounter (Signed)
Pt called stating that not all of her medications were called in for her. She states that her ondansetron (ZOFRAN) 4 MG tablet and the cyclobenzaprine (FLEXERIL) 5 MG tablet were not called in for her. Pt would like to know if the cyclobenzaprine (FLEXERIL) 5 MG tablet can be increased to the 10mg . These need to be called in to the [facility] on [address]

## 2020-04-28 NOTE — Telephone Encounter (Signed)
Let her know that she can use two tablets as needed for a total of 10mg  but I would prefer to keep the prescription at 5mg . My recommendation would be take 5mg  as needed and add an additional 5mg  tablet on days when tension is worse. Thanks.

## 2020-04-28 NOTE — Telephone Encounter (Signed)
Order placed for 5mg  cyclobenzaprine yesterday.  She is asking for 10mg  tab increase.  Please advise.

## 2020-04-29 DIAGNOSIS — Z79891 Long term (current) use of opiate analgesic: Secondary | ICD-10-CM | POA: Diagnosis not present

## 2020-05-05 ENCOUNTER — Encounter: Payer: Self-pay | Admitting: Physical Therapy

## 2020-05-05 ENCOUNTER — Ambulatory Visit: Payer: Medicaid Other | Attending: Family Medicine | Admitting: Physical Therapy

## 2020-05-05 ENCOUNTER — Other Ambulatory Visit: Payer: Self-pay

## 2020-05-05 DIAGNOSIS — G43001 Migraine without aura, not intractable, with status migrainosus: Secondary | ICD-10-CM | POA: Insufficient documentation

## 2020-05-05 DIAGNOSIS — M542 Cervicalgia: Secondary | ICD-10-CM | POA: Diagnosis not present

## 2020-05-05 NOTE — Therapy (Signed)
[facility] [address] [place], [place], [address] Phone: [phone]   Fax:  [phone]  Physical Therapy Evaluation  Patient Details  Name: Gail Blackburn MRN: [id number] Date of Birth: 20-Dec-1975 Referring Provider (PT): Shawnie Dapper, NP   Encounter Date: 05/05/2020   PT End of Session - 05/05/20 1337    Visit Number 1    Date for PT Re-Evaluation 07/18/20    Authorization Type [employer] of [place]    Authorization - Visit Number 0    Authorization - Number of Visits 4    PT Start Time 1334    PT Stop Time 1410    PT Time Calculation (min) 36 min    Activity Tolerance Patient tolerated treatment well    Behavior During Therapy [facility] for tasks assessed/performed           Past Medical History:  Diagnosis Date  . Anemia    PRIOR HISTORY  . Anxiety   . Asthma   . Bipolar disorder (HCC)    Borderline  . Depression   . Fibromyalgia   . Headache    MIGRAINES  . Hypothyroidism 2009   TOOK MEDS FOR FEW WEEKS NO MEDS NOW  . Migraine   . Thyroid disease    treated in past not sure if too high or too low    Past Surgical History:  Procedure Laterality Date  . COLONOSCOPY    . DILATION AND EVACUATION N/A 05/06/2016   Procedure: DILATATION AND EVACUATION;  Surgeon: Huel Cote, MD;  Location: [facility];  Service: Gynecology;  Laterality: N/A;  . FOOT SURGERY    . TEETH PULLED  03/2016  . WISDOM TOOTH EXTRACTION      There were no vitals filed for this visit.    Subjective Assessment - 05/05/20 1338    Subjective Botox injections still every 3 mo. Under a lot of stress. still feeling tension along cervical paraspinals with sharp pain in temporal regions & behind my eyes. Denies N/T into UE but does feel some numbness behind Lt scapula. Went to MD for eye appointment recently. Not doing exercises as much as she should.    Patient Stated Goals decrease tension & migraines    Currently in Pain? Yes    Pain Score 7     Pain  Location Neck    Pain Orientation Left    Pain Descriptors / Indicators Sharp    Aggravating Factors  stress, activity    Pain Relieving Factors rest, DN              [facility] PT Assessment - 05/05/20 0001      Assessment   Medical Diagnosis cervicalgia, migraines    Referring Provider (PT) Amy Lomax, NP    Onset Date/Surgical Date --   chronic, approx 2015   Hand Dominance Right      Precautions   Precautions None      Restrictions   Weight Bearing Restrictions No      Balance Screen   Has the patient fallen in the past 6 months No      Home Environment   Living Environment Private residence    Additional Comments has been moving into and out of her home      Prior Function   Level of Independence Independent      Cognition   Overall Cognitive Status Within Functional Limits for tasks assessed      Observation/Other Assessments   Focus on Therapeutic Outcomes (FOTO)  N/A MCD      Sensation   Additional Comments some numbness around Lt scapula      Posture/Postural Control   Posture Comments upright with bil rounded shoulder (Lt>Rt), mild loss of thoracic kyphosis      ROM / Strength   AROM / PROM / Strength AROM;Strength      AROM   AROM Assessment Site Cervical    Cervical Flexion 28    Cervical Extension 32    Cervical - Right Side Bend 30    Cervical - Left Side Bend 30    Cervical - Right Rotation 60    Cervical - Left Rotation 42      Strength   Overall Strength Comments gross UE 5/5, bil ER 4/5                      Objective measurements completed on examination: See above findings.       [facility] Adult PT Treatment/Exercise - 05/05/20 0001      Manual Therapy   Manual therapy comments skilled palpation and monitorinmg during DN            Trigger Point Dry Needling - 05/05/20 0001    Consent Given? Yes    Education Handout Provided Previously provided    Muscles Treated Head and Neck Upper trapezius    Upper Trapezius  Response Twitch reponse elicited;Palpable increased muscle length   bilateral               PT Education - 05/05/20 1432    Education Details anatomy of condition, POC, HEP,, exercise form/rationale    Person(s) Educated Patient    Methods Explanation    Comprehension Verbalized understanding;Need further instruction            PT Short Term Goals - 05/05/20 1427      PT SHORT TERM GOAL #1   Title Cervical rotation within 5 deg Rt to Lt to decrease unequal strain    Baseline see flowsheet    Time 4    Period Weeks    Status New    Target Date 06/06/20      PT SHORT TERM GOAL #2   Title will verbalize performing HEP at least every other day    Baseline not doing any right now    Time 4    Period Weeks    Status New    Target Date 06/06/20      PT SHORT TERM GOAL #3   Title GHJ ER strength to 5/5    Baseline 4/5 today    Time 4    Period Weeks    Status New    Target Date 06/06/20             PT Long Term Goals - 05/05/20 1429      PT LONG TERM GOAL #1   Title to be set at ERO PRN                  Plan - 05/05/20 1417    Clinical Impression Statement Pt presents to PT with complaints of continued migraines and cervicalgia that has been a chronic complaint. Has been treated by PT in the past and DN is very helpful and she is returning for continued treatment. Significant tightnes bilaterally in upper traps and cerivcal paraspinals. Pt has done well working on weight loss and improving her posture. Lt shoulder is rounded forward and only corrected with mobilizations to clavicle-  she will ask her chiropractor to look at this as well. I utilized DN as a treatment today as I have done this treatment on her before and I am familiar with this patient. I have asked her to return to doing her exercises from our last round of PT as her HEP today. Will benefit from skilled PT to address postural deviations to decrease pain and meet functional goals.    Personal  Factors and Comorbidities Comorbidity 1;Time since onset of injury/illness/exacerbation    Comorbidities fibromyalgia, high stress levels due to home environment    Examination-Activity Limitations Bathing;Reach Overhead;Sit;Caring for Others;Carry;Sleep;Lift    Examination-Participation Restrictions Cleaning;Meal Prep;Community Activity;Driving;Laundry    Stability/Clinical Decision Making Evolving/Moderate complexity    Clinical Decision Making Moderate    Rehab Potential Good    PT Frequency --   3 visits in first auth followed by 2/week for 6 weeks   PT Duration Other (comment)   10 weeks   PT Treatment/Interventions ADLs/Self Care Home Management;Cryotherapy;Electrical Stimulation;Traction;Moist Heat;Iontophoresis 4mg /ml Dexamethasone;Therapeutic activities;Therapeutic exercise;Patient/family education;Neuromuscular re-education;Manual techniques;Taping;Dry needling;Joint Manipulations;Spinal Manipulations    PT Next Visit Plan continue DN PRN, review HEP    PT Home Exercise Plan from last episode    Consulted and Agree with Plan of Care Patient           Patient will benefit from skilled therapeutic intervention in order to improve the following deficits and impairments:  Improper body mechanics, Pain, Postural dysfunction, Increased muscle spasms, Decreased activity tolerance, Decreased strength, Impaired UE functional use, Hypermobility  Visit Diagnosis: Migraine without aura and with status migrainosus, not intractable - Plan: PT plan of care cert/re-cert  Cervicalgia - Plan: PT plan of care cert/re-cert     Problem List Patient Active Problem List   Diagnosis Date Noted  . UTI (urinary tract infection) 12/19/2019  . Tinea manuum 05/21/2019  . Abdominal wall lump 02/21/2018  . Chronic fatigue syndrome with fibromyalgia 02/24/2017  . Skin lesion of left leg 02/10/2017  . History of abnormal cervical Pap smear 02/10/2017  . Anxiety 10/15/2014  . Depression 10/15/2014  .  Migraine 10/15/2014  . Chronic pain 10/15/2014  . Low TSH level 10/15/2014  . Tobacco use disorder 10/15/2014    Mahathi Pokorney C. Casondra Gasca PT, DPT 05/05/20 2:35 PM   [facility] [address] [place], [place], [place] Phone: [phone]   Fax:  [phone]  Name: Gail Blackburn MRN: Angeline Slim Date of Birth: August 04, 1976

## 2020-05-20 DIAGNOSIS — H5213 Myopia, bilateral: Secondary | ICD-10-CM | POA: Diagnosis not present

## 2020-05-25 ENCOUNTER — Telehealth: Payer: Self-pay | Admitting: Family Medicine

## 2020-05-25 ENCOUNTER — Other Ambulatory Visit: Payer: Self-pay | Admitting: Neurology

## 2020-05-25 NOTE — Telephone Encounter (Signed)
I called and LMVM for pt that medications that she needs refills on she does have them available [facility] [address]  Celebrex done  04-16-2020 filled no refill.  zofran was done today.

## 2020-05-25 NOTE — Telephone Encounter (Signed)
Pt called stating that her pharmacy informed her that they requested her medication and they were informed that she was not a pt here. Pt is needing a refill on her celecoxib (CELEBREX) 100 MG capsule,  cyclobenzaprine (FLEXERIL) 5 MG tablet,  Galcanezumab-gnlm (EMGALITY) 120 MG/ML SOAJ,  ondansetron (ZOFRAN) 4 MG tablet and her rizatriptan (MAXALT-MLT) 10 MG disintegrating tablet sent in to the [facility] on [address]

## 2020-05-29 ENCOUNTER — Ambulatory Visit: Payer: Medicaid Other | Attending: Family Medicine | Admitting: Physical Therapy

## 2020-05-29 ENCOUNTER — Other Ambulatory Visit: Payer: Self-pay

## 2020-05-29 ENCOUNTER — Encounter: Payer: Self-pay | Admitting: Physical Therapy

## 2020-05-29 DIAGNOSIS — M6281 Muscle weakness (generalized): Secondary | ICD-10-CM

## 2020-05-29 DIAGNOSIS — R293 Abnormal posture: Secondary | ICD-10-CM

## 2020-05-29 DIAGNOSIS — G43001 Migraine without aura, not intractable, with status migrainosus: Secondary | ICD-10-CM

## 2020-05-29 DIAGNOSIS — M542 Cervicalgia: Secondary | ICD-10-CM | POA: Diagnosis not present

## 2020-05-29 DIAGNOSIS — R252 Cramp and spasm: Secondary | ICD-10-CM | POA: Diagnosis not present

## 2020-05-29 NOTE — Therapy (Signed)
[facility] [address] [place], [place], [address] Phone: [phone]   Fax:  [phone]  Physical Therapy Treatment/ERO  Patient Details  Name: Gail Blackburn MRN: [id number] Date of Birth: [birth year] Referring Provider (PT): Shawnie Dapper, NP   Encounter Date: 05/29/2020   PT End of Session - 05/29/20 1322    Visit Number 2    Date for PT Re-Evaluation 07/18/20    Authorization Type MCD of [place]    Authorization Time Period 8/17-9/6    Authorization - Visit Number 1    Authorization - Number of Visits 4    PT Start Time 1317    PT Stop Time 1356    PT Time Calculation (min) 39 min    Activity Tolerance Patient tolerated treatment well    Behavior During Therapy [facility] for tasks assessed/performed           Past Medical History:  Diagnosis Date  . Anemia    PRIOR HISTORY  . Anxiety   . Asthma   . Bipolar disorder (HCC)    Borderline  . Depression   . Fibromyalgia   . Headache    MIGRAINES  . Hypothyroidism 2009   TOOK MEDS FOR FEW WEEKS NO MEDS NOW  . Migraine   . Thyroid disease    treated in past not sure if too high or too low    Past Surgical History:  Procedure Laterality Date  . COLONOSCOPY    . DILATION AND EVACUATION N/A 05/06/2016   Procedure: DILATATION AND EVACUATION;  Surgeon: Huel Cote, MD;  Location: [facility];  Service: Gynecology;  Laterality: N/A;  . FOOT SURGERY    . TEETH PULLED  03/2016  . WISDOM TOOTH EXTRACTION      There were no vitals filed for this visit.   Subjective Assessment - 05/29/20 1322    Subjective Pain is awful and my stress level is way up. I am working on my posture.    Patient Stated Goals decrease tension & migraines    Currently in Pain? Yes    Pain Score 7     Pain Location Neck    Pain Orientation Right;Left   Rt worse   Pain Descriptors / Indicators Tightness    Aggravating Factors  stress    Pain Relieving Factors massage somewhat, DN               [facility] PT Assessment - 05/29/20 0001      Assessment   Medical Diagnosis cervicalgia, migraines    Referring Provider (PT) Shawnie Dapper, NP    Onset Date/Surgical Date --   approx 2015   Hand Dominance Right      Observation/Other Assessments   Focus on Therapeutic Outcomes (FOTO)  N/A MCD      Sensation   Additional Comments occasional tingling in fingers not always associated with migraines, Lt arm will go numb a lot      Posture/Postural Control   Posture Comments able to demo upright posture      AROM   Cervical Flexion 30    Cervical Extension 44    Cervical - Right Side Bend 30    Cervical - Left Side Bend 30    Cervical - Right Rotation 60    Cervical - Left Rotation 60      Strength   Overall Strength Comments bil ER 4+/5  [facility] Adult PT Treatment/Exercise - 05/29/20 0001      Exercises   Exercises Shoulder      Shoulder Exercises: Stretch   Other Shoulder Stretches child pose      Manual Therapy   Manual therapy comments skilled palpation and monitoring during TPDN            Trigger Point Dry Needling - 05/29/20 0001    Muscles Treated Upper Quadrant Subscapularis;Supraspinatus    Other Dry Needling bil suboccipitals    Upper Trapezius Response Twitch reponse elicited;Palpable increased muscle length   bil   Supraspinatus Response Twitch response elicited;Palpable increased muscle length   Left   Subscapularis Response Twitch response elicited;Palpable increased muscle length   bil in prone               PT Education - 05/29/20 1455    Education Details anatomy of condition, POC, HEP, managing stress    Person(s) Educated Patient    Methods Explanation    Comprehension Verbalized understanding;Need further instruction            PT Short Term Goals - 05/29/20 1345      PT SHORT TERM GOAL #1   Title Cervical rotation within 5 deg Rt to Lt to decrease unequal strain    Baseline see flowsheet    Status  Achieved      PT SHORT TERM GOAL #2   Title will verbalize performing HEP at least every other day    Baseline about 1/week right now    Status On-going      PT SHORT TERM GOAL #3   Title GHJ ER strength to 5/5    Baseline flexion 5/5, abd 5/5, ER 4+/5, all improved but uncomfortable to test    Status On-going             PT Long Term Goals - 05/29/20 1351      PT LONG TERM GOAL #1   Title pt will be able to drive comfortably without limitation by neck pain    Baseline very uncomfortable    Time 8   extra time for scheduling/MCD auth   Period Weeks    Status New    Target Date 07/25/20      PT LONG TERM GOAL #2   Title HA/migraines to =<3 days/week    Baseline every day at ERO    Time 8    Period Weeks    Status New    Target Date 07/25/20                 Plan - 05/29/20 1451    Clinical Impression Statement Pt continues to have very high stress levels that are causing tension HA and migraines. Measurements were taken post-DN today with measurements outlined in flowsheet. Pt reported near full resolution of HA today following treatment. She is improving in her endurance to hold upright posture as I did not have to correct her during the treatment today. She reports that she has found her bands after moving and is going to increase her frequency at which she is performing her HEP. Pt will cont to benefit from skilled PT to decrease tension that is causing migraines and contintinue toward functional LTGs.    Examination-Participation Restrictions Cleaning;Meal Prep;Community Activity;Driving;Laundry    PT Frequency 2x / week    PT Duration 6 weeks    PT Treatment/Interventions ADLs/Self Care Home Management;Cryotherapy;Electrical Stimulation;Traction;Moist Heat;Iontophoresis 4mg /ml Dexamethasone;Therapeutic activities;Therapeutic exercise;Patient/family education;Neuromuscular re-education;Manual techniques;Taping;Dry needling;Joint Manipulations;Spinal Manipulations  PT Next Visit Plan DN PRN, review HEP    PT Home Exercise Plan from last episode    Consulted and Agree with Plan of Care Patient           Patient will benefit from skilled therapeutic intervention in order to improve the following deficits and impairments:  Improper body mechanics, Pain, Postural dysfunction, Increased muscle spasms, Decreased activity tolerance, Decreased strength, Impaired UE functional use, Hypermobility  Visit Diagnosis: Migraine without aura and with status migrainosus, not intractable  Cervicalgia  Abnormal posture  Muscle weakness (generalized)  Cramp and spasm     Problem List Patient Active Problem List   Diagnosis Date Noted  . UTI (urinary tract infection) 12/19/2019  . Tinea manuum 05/21/2019  . Abdominal wall lump 02/21/2018  . Chronic fatigue syndrome with fibromyalgia 02/24/2017  . Skin lesion of left leg 02/10/2017  . History of abnormal cervical Pap smear 02/10/2017  . Anxiety 10/15/2014  . Depression 10/15/2014  . Migraine 10/15/2014  . Chronic pain 10/15/2014  . Low TSH level 10/15/2014  . Tobacco use disorder 10/15/2014    Tiger Spieker C. Tashika Goodin PT, DPT 05/29/20 2:56 PM   [facility] [address] [place], [place], [address] Phone: [phone]   Fax:  [phone]  Name: Gearldine Looney MRN: [id number] Date of Birth: 04-06-1976

## 2020-06-08 ENCOUNTER — Other Ambulatory Visit: Payer: Self-pay

## 2020-06-08 ENCOUNTER — Ambulatory Visit (INDEPENDENT_AMBULATORY_CARE_PROVIDER_SITE_OTHER): Payer: Medicaid Other | Admitting: Family Medicine

## 2020-06-08 ENCOUNTER — Encounter: Payer: Self-pay | Admitting: Family Medicine

## 2020-06-08 DIAGNOSIS — M542 Cervicalgia: Secondary | ICD-10-CM

## 2020-06-08 DIAGNOSIS — G43709 Chronic migraine without aura, not intractable, without status migrainosus: Secondary | ICD-10-CM | POA: Diagnosis not present

## 2020-06-08 NOTE — Progress Notes (Addendum)
PATIENT: Gail Blackburn DOB: [birth year]  REASON FOR VISIT: follow up HISTORY FROM: patient  Virtual Visit via Telephone Note  I connected with Gail Blackburn on 06/08/20 at  1:00 PM EDT by telephone and verified that I am speaking with the correct person using two identifiers.   I discussed the limitations, risks, security and privacy concerns of performing an evaluation and management service by telephone and the availability of in person appointments. I also discussed with the patient that there may be a patient responsible charge related to this service. The patient expressed understanding and agreed to proceed.   History of Present Illness:  06/08/20 Gail Blackburn is a 44 y.o. female here today for follow up migraines. She continues Botox therapy. She feels it works very well but tends to wear off about 1 week prior to next procedure. She also continues Emgality monthly and propranolol 60mg  daily. She is using rizatriptan for abortive therapy. She is working with PT for dry needling. She is currently getting dry needling twice weekly for the next 6 weeks. She feels this helps significantly with neck pain. She uses celecoxib 100mg , cyclobenzaprine 5mg  and Voltaren cream as needed for neck pain/migraine abortion. She continues follow up with psychiatry on suboxone and PCP on venlafaxine and gabapentin. She feels that she is doing fairly well. She typically has 7-8 headache days per month, 3-4 are migrainous.   History (copied from my note on 03/21/2019)  Gail Blackburn is a 44 y.o. female here today for follow up for migraines. She is currently doing great on Botox therapy.  She also continues Emgality monthly and propranolol 60 mg daily.  She is using multiple abortive therapies including Robaxin, rizatriptan, Zofran and pulled the hearing cream.  She also takes Celebrex as needed.  She reports that her migraines continue to be fairly frequent.  They first month following Botox therapy she reports  migraines are very well managed with less than 5 in a month.  She states that the second month following her migraines increase.  By the third month she is having greater than 15 headache/migraine days in a month.  She does feel that the rizatriptan helps but states that she is going through a full prescription in a month.  She is taken a 5 mg tablet.  She reports that the Robaxin no longer offers relief.  She has taken tizanidine in the past as well with no benefit.  She is requesting a prescription for Flexeril.  She has taken this in the past with significant benefit.  She was also participating in dry needling prior to pandemic.  She is planning to call to start therapy again. 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  This is managed by her psychiatrist.  She is doing very well and has been at her current dose for 5 years.  She has not considered weaning Suboxone.      History (copied from Dr 03/23/2019 not on 04/07/2017)  41 [address] a 44 y.o.femalehere as a referral from Dr. 04/09/2017 migraines. PMHx migraines, anxiety, depression. Started when she was 41. Had them off and on for years. Has daily headaches for the last 10 years.Over 15 a month are migrainous. Can last all day long up to 24 hours or sometimes days. Can be severe 10/10 pain.They start behind the left eye and spread to the back of the head, pounding, throbbing, pulsating, with superimposed sharp pains, severe light sensitivity has to go into a dark room, she has  nausea. No vomiting. No aura. Staring at the screen makes it worse. She has a lot of blurred vision and eye pain. Headaches worsening for the last 3 months.Worse with bending over, positional.She has a lot of neck pain. Had nerve blocks in the past and went to the headache wellness center. Nerve blocks did not help.Headaches are worse bending over, vision changes.No medication overuse. No aura.No other focal neurologic deficits, associated  symptoms, inciting events or modifiable factors.  Tried: Nortriptyline, Amitriptyline, cymbalta, celebrex, gabapentin,  Reviewed notes, labs and imaging from outside physicians, which showed:   B12, TSH nml. BMP and CBC Essentially normal.    Observations/Objective:  Televisit    Assessment and Plan:  44 y.o. year old female  has a past medical history of Anemia, Anxiety, Asthma, Bipolar disorder (HCC), Depression, Fibromyalgia, Headache, Hypothyroidism (2009), Migraine, and Thyroid disease. here with    ICD-10-CM   1. Chronic migraine without aura without status migrainosus, not intractable  G43.709   2. Cervicalgia  M54.2    Jalana is doing well on current treatment plan. We will continue Botox every 12 weeks, Emgality every 30 days, propranolol 60mg  daily and rizatriptan as needed. She may also use celecoxib, cyclobenzaprine and Voltaren as needed but advised against regular use. She will continue PT, dry needling, as directed. She was encouraged to continue healthy lifestyle habits. She will follow up every 12 weeks for Botox and annually for office visit.    No orders of the defined types were placed in this encounter.   No orders of the defined types were placed in this encounter.    Follow Up Instructions:  I discussed the assessment and treatment plan with the patient. The patient was provided an opportunity to ask questions and all were answered. The patient agreed with the plan and demonstrated an understanding of the instructions.   The patient was advised to call back or seek an in-person evaluation if the symptoms worsen or if the condition fails to improve as anticipated.  I provided 15 minutes of non-face-to-face time during this encounter. Patient located at her place of residence during [place], provider is in the office.    [place], NP   Made any corrections needed, and agree with history, physical, neuro exam,assessment and plan as stated.      Shawnie Dapper, MD [facility]

## 2020-06-11 ENCOUNTER — Other Ambulatory Visit: Payer: Self-pay | Admitting: *Deleted

## 2020-06-11 MED ORDER — CELECOXIB 100 MG PO CAPS: 100.0000 mg | ORAL_CAPSULE | Freq: Every day | ORAL | 0 refills | Status: DC | PRN

## 2020-06-17 ENCOUNTER — Ambulatory Visit: Payer: Medicaid Other | Admitting: Physical Therapy

## 2020-06-17 ENCOUNTER — Encounter: Payer: Self-pay | Admitting: Physical Therapy

## 2020-06-17 ENCOUNTER — Other Ambulatory Visit: Payer: Self-pay

## 2020-06-17 DIAGNOSIS — G43001 Migraine without aura, not intractable, with status migrainosus: Secondary | ICD-10-CM

## 2020-06-17 DIAGNOSIS — R252 Cramp and spasm: Secondary | ICD-10-CM | POA: Diagnosis not present

## 2020-06-17 DIAGNOSIS — M6281 Muscle weakness (generalized): Secondary | ICD-10-CM | POA: Diagnosis not present

## 2020-06-17 DIAGNOSIS — R293 Abnormal posture: Secondary | ICD-10-CM | POA: Diagnosis not present

## 2020-06-17 DIAGNOSIS — M542 Cervicalgia: Secondary | ICD-10-CM | POA: Diagnosis not present

## 2020-06-17 NOTE — Therapy (Signed)
[facility] [address] [place], [place], [address] Phone: [phone]   Fax:  [phone]  Physical Therapy Treatment  Patient Details  Name: Gail Blackburn MRN: [id number] Date of Birth: 1976/03/03 Referring Provider (PT): Shawnie Dapper, NP   Encounter Date: 06/17/2020   PT End of Session - 06/17/20 1708    Visit Number 3    Date for PT Re-Evaluation 07/18/20    Authorization Type [employer] [place]    Authorization Time Period 9/21-11/1    Authorization - Visit Number 1    Authorization - Number of Visits 12    PT Start Time 1641   pt arrived late   PT Stop Time 1703    PT Time Calculation (min) 22 min    Activity Tolerance Patient tolerated treatment well    Behavior During Therapy [facility] for tasks assessed/performed           Past Medical History:  Diagnosis Date  . Anemia    PRIOR HISTORY  . Anxiety   . Asthma   . Bipolar disorder (HCC)    Borderline  . Depression   . Fibromyalgia   . Headache    MIGRAINES  . Hypothyroidism 2009   TOOK MEDS FOR FEW WEEKS NO MEDS NOW  . Migraine   . Thyroid disease    treated in past not sure if too high or too low    Past Surgical History:  Procedure Laterality Date  . COLONOSCOPY    . DILATION AND EVACUATION N/A 05/06/2016   Procedure: DILATATION AND EVACUATION;  Surgeon: Huel Cote, MD;  Location: [facility];  Service: Gynecology;  Laterality: N/A;  . FOOT SURGERY    . TEETH PULLED  03/2016  . WISDOM TOOTH EXTRACTION      There were no vitals filed for this visit.   Subjective Assessment - 06/17/20 1643    Subjective Last 2 days were bad. Tdoay not so bad, just a migraine behind my Lt eye. Had 1 really really bad day since last visit which I took all my meds. Still doing botox every 3 weeks. I have been trying to do my exercises.    Patient Stated Goals decrease tension & migraines    Currently in Pain? Yes    Pain Score 7     Pain Location Neck    Pain Orientation Left     Pain Descriptors / Indicators Tightness                             [facility] Adult PT Treatment/Exercise - 06/17/20 0001      Manual Therapy   Manual Therapy Soft tissue mobilization;Joint mobilization    Manual therapy comments skilled palpation and monitoring during TPDN    Joint Mobilization thoracic & rib mobs with multiple cavitations in grade 4 mobilizations    Soft tissue mobilization STM applied following DN to each documented muscle group            Trigger Point Dry Needling - 06/17/20 0001    Muscles Treated Head and Neck Levator scapulae    Other Dry Needling bil suboccipitals, C3-6 Lt paraspinals    Upper Trapezius Response Twitch reponse elicited;Palpable increased muscle length   Lt   Levator Scapulae Response Twitch response elicited;Palpable increased muscle length   Lt                 PT Short Term Goals - 05/29/20 1345  PT SHORT TERM GOAL #1   Title Cervical rotation within 5 deg Rt to Lt to decrease unequal strain    Baseline see flowsheet    Status Achieved      PT SHORT TERM GOAL #2   Title will verbalize performing HEP at least every other day    Baseline about 1/week right now    Status On-going      PT SHORT TERM GOAL #3   Title GHJ ER strength to 5/5    Baseline flexion 5/5, abd 5/5, ER 4+/5, all improved but uncomfortable to test    Status On-going             PT Long Term Goals - 05/29/20 1351      PT LONG TERM GOAL #1   Title pt will be able to drive comfortably without limitation by neck pain    Baseline very uncomfortable    Time 8   extra time for scheduling/MCD auth   Period Weeks    Status New    Target Date 07/25/20      PT LONG TERM GOAL #2   Title HA/migraines to =<3 days/week    Baseline every day at ERO    Time 8    Period Weeks    Status New    Target Date 07/25/20                 Plan - 06/17/20 1709    Clinical Impression Statement Was able to resolve migraine symptoms with  DN and manual therapy today. Her stress levels are still reported as very high which contribute to migraines. I asked her to perform gentle movement and take a hot shower in a couple of hours to maintain relaxed muscles.    PT Treatment/Interventions ADLs/Self Care Home Management;Cryotherapy;Electrical Stimulation;Traction;Moist Heat;Iontophoresis 4mg /ml Dexamethasone;Therapeutic activities;Therapeutic exercise;Patient/family education;Neuromuscular re-education;Manual techniques;Taping;Dry needling;Joint Manipulations;Spinal Manipulations    PT Next Visit Plan DN PRN    PT Home Exercise Plan from last episode    Consulted and Agree with Plan of Care Patient           Patient will benefit from skilled therapeutic intervention in order to improve the following deficits and impairments:  Improper body mechanics, Pain, Postural dysfunction, Increased muscle spasms, Decreased activity tolerance, Decreased strength, Impaired UE functional use, Hypermobility  Visit Diagnosis: Migraine without aura and with status migrainosus, not intractable  Cervicalgia     Problem List Patient Active Problem List   Diagnosis Date Noted  . UTI (urinary tract infection) 12/19/2019  . Tinea manuum 05/21/2019  . Abdominal wall lump 02/21/2018  . Chronic fatigue syndrome with fibromyalgia 02/24/2017  . Skin lesion of left leg 02/10/2017  . History of abnormal cervical Pap smear 02/10/2017  . Anxiety 10/15/2014  . Depression 10/15/2014  . Migraine 10/15/2014  . Chronic pain 10/15/2014  . Low TSH level 10/15/2014  . Tobacco use disorder 10/15/2014   Gail Blackburn C. Anaya Bovee PT, DPT 06/17/20 5:11 PM   [facility] [address] [place], [place], [place] Phone: [phone]   Fax:  [phone]  Name: Gail Blackburn MRN: Angeline Slim Date of Birth: 1975/12/31

## 2020-06-20 ENCOUNTER — Ambulatory Visit: Payer: Medicaid Other | Admitting: Physical Therapy

## 2020-06-20 ENCOUNTER — Telehealth: Payer: Self-pay | Admitting: Physical Therapy

## 2020-06-20 NOTE — Telephone Encounter (Signed)
LVM regarding NS today & advised of next appointment time. Requested call back if she needs to RS.  Jemia Fata C. Conard Alvira PT, DPT 06/20/20 11:09 AM

## 2020-06-23 ENCOUNTER — Encounter: Payer: Self-pay | Admitting: Physical Therapy

## 2020-06-23 ENCOUNTER — Other Ambulatory Visit: Payer: Self-pay

## 2020-06-23 ENCOUNTER — Ambulatory Visit: Payer: Medicaid Other | Admitting: Physical Therapy

## 2020-06-23 DIAGNOSIS — G43001 Migraine without aura, not intractable, with status migrainosus: Secondary | ICD-10-CM | POA: Diagnosis not present

## 2020-06-23 DIAGNOSIS — R252 Cramp and spasm: Secondary | ICD-10-CM | POA: Diagnosis not present

## 2020-06-23 DIAGNOSIS — R293 Abnormal posture: Secondary | ICD-10-CM

## 2020-06-23 DIAGNOSIS — M542 Cervicalgia: Secondary | ICD-10-CM | POA: Diagnosis not present

## 2020-06-23 DIAGNOSIS — M6281 Muscle weakness (generalized): Secondary | ICD-10-CM | POA: Diagnosis not present

## 2020-06-23 NOTE — Therapy (Signed)
[facility] Outpatient Rehabilitation [facility] [address] [place], [place], [address] Phone: [phone]   Fax:  [phone]  Physical Therapy Treatment  Patient Details  Name: Gail Blackburn MRN: [id number] Date of Birth: 1976/01/24 Referring Provider (PT): Shawnie Dapper, NP   Encounter Date: 06/23/2020   PT End of Session - 06/23/20 1626    Visit Number 4    Date for PT Re-Evaluation 07/18/20    Authorization Type M[employer] of [place]    Authorization Time Period 9/21-11/1    Authorization - Visit Number 2    Authorization - Number of Visits 12    PT Start Time 1545    PT Stop Time 1625    PT Time Calculation (min) 40 min    Activity Tolerance Patient tolerated treatment well    Behavior During Therapy [facility] for tasks assessed/performed           Past Medical History:  Diagnosis Date  . Anemia    PRIOR HISTORY  . Anxiety   . Asthma   . Bipolar disorder (HCC)    Borderline  . Depression   . Fibromyalgia   . Headache    MIGRAINES  . Hypothyroidism 2009   TOOK MEDS FOR FEW WEEKS NO MEDS NOW  . Migraine   . Thyroid disease    treated in past not sure if too high or too low    Past Surgical History:  Procedure Laterality Date  . COLONOSCOPY    . DILATION AND EVACUATION N/A 05/06/2016   Procedure: DILATATION AND EVACUATION;  Surgeon: Huel Cote, MD;  Location: [facility];  Service: Gynecology;  Laterality: N/A;  . FOOT SURGERY    . TEETH PULLED  03/2016  . WISDOM TOOTH EXTRACTION      There were no vitals filed for this visit.   Subjective Assessment - 06/23/20 1551    Subjective This morning had a HA behind Lt eye, they have not been as bad. Rt side of neck and Lt scapula hurt today.    Patient Stated Goals decrease tension & migraines              [facility] PT Assessment - 06/23/20 0001      AROM   Cervical - Right Side Bend 26    Cervical - Left Side Bend 30                         [facility] Adult PT Treatment/Exercise - 06/23/20  0001      Shoulder Exercises: Standing   Other Standing Exercises reviewed wide & narrow row and Ws with free weigts for gym workouts      Manual Therapy   Manual Therapy Manual Traction    Manual therapy comments skilled palpation and monitoring during TPDN    Joint Mobilization prone thoracic & rib PA, Rt lateral cervical PA C3-6; supine lateral cervical glides    Soft tissue mobilization all muscles following DN, suboccipital release, bil pec stretching    Manual Traction cervical            Trigger Point Dry Needling - 06/23/20 0001    Muscles Treated Upper Quadrant Infraspinatus;Rhomboids    Other Dry Needling Rt suboccipitals    Levator Scapulae Response Twitch response elicited;Palpable increased muscle length   Lt   Rhomboids Response Twitch response elicited;Palpable increased muscle length   Lt   Infraspinatus Response Twitch response elicited;Palpable increased muscle length   Lr  PT Short Term Goals - 05/29/20 1345      PT SHORT TERM GOAL #1   Title Cervical rotation within 5 deg Rt to Lt to decrease unequal strain    Baseline see flowsheet    Status Achieved      PT SHORT TERM GOAL #2   Title will verbalize performing HEP at least every other day    Baseline about 1/week right now    Status On-going      PT SHORT TERM GOAL #3   Title GHJ ER strength to 5/5    Baseline flexion 5/5, abd 5/5, ER 4+/5, all improved but uncomfortable to test    Status On-going             PT Long Term Goals - 05/29/20 1351      PT LONG TERM GOAL #1   Title pt will be able to drive comfortably without limitation by neck pain    Baseline very uncomfortable    Time 8   extra time for scheduling/MCD auth   Period Weeks    Status New    Target Date 07/25/20      PT LONG TERM GOAL #2   Title HA/migraines to =<3 days/week    Baseline every day at ERO    Time 8    Period Weeks    Status New    Target Date 07/25/20                 Plan -  06/23/20 1628    Clinical Impression Statement pt has great tolerance to DN and reports feeling less tense following treatment. is working out but required cues for posture to avoid shoulder elevation which aggrivates symptoms.    PT Treatment/Interventions ADLs/Self Care Home Management;Cryotherapy;Electrical Stimulation;Traction;Moist Heat;Iontophoresis 4mg /ml Dexamethasone;Therapeutic activities;Therapeutic exercise;Patient/family education;Neuromuscular re-education;Manual techniques;Taping;Dry needling;Joint Manipulations;Spinal Manipulations    PT Next Visit Plan DN PRN, review gym workouts- machines? pt works with    PT Home Exercise Plan from last episode    Consulted and Agree with Plan of Care Patient           Patient will benefit from skilled therapeutic intervention in order to improve the following deficits and impairments:  Improper body mechanics, Pain, Postural dysfunction, Increased muscle spasms, Decreased activity tolerance, Decreased strength, Impaired UE functional use, Hypermobility  Visit Diagnosis: Migraine without aura and with status migrainosus, not intractable  Cervicalgia  Abnormal posture     Problem List Patient Active Problem List   Diagnosis Date Noted  . UTI (urinary tract infection) 12/19/2019  . Tinea manuum 05/21/2019  . Abdominal wall lump 02/21/2018  . Chronic fatigue syndrome with fibromyalgia 02/24/2017  . Skin lesion of left leg 02/10/2017  . History of abnormal cervical Pap smear 02/10/2017  . Anxiety 10/15/2014  . Depression 10/15/2014  . Migraine 10/15/2014  . Chronic pain 10/15/2014  . Low TSH level 10/15/2014  . Tobacco use disorder 10/15/2014    Nickolette Espinola C. Brayden Betters PT, DPT 06/23/20 4:30 PM   [facility] [address] [place], [place], [place] Phone: [phone]   Fax:  [phone]  Name: Arneisha Kincannon MRN: Angeline Slim Date of Birth: [birth year]

## 2020-06-25 ENCOUNTER — Other Ambulatory Visit: Payer: Self-pay

## 2020-06-25 ENCOUNTER — Encounter: Payer: Self-pay | Admitting: Physical Therapy

## 2020-06-25 ENCOUNTER — Ambulatory Visit: Payer: Medicaid Other | Admitting: Physical Therapy

## 2020-06-25 DIAGNOSIS — G43001 Migraine without aura, not intractable, with status migrainosus: Secondary | ICD-10-CM

## 2020-06-25 DIAGNOSIS — M542 Cervicalgia: Secondary | ICD-10-CM | POA: Diagnosis not present

## 2020-06-25 DIAGNOSIS — M6281 Muscle weakness (generalized): Secondary | ICD-10-CM | POA: Diagnosis not present

## 2020-06-25 DIAGNOSIS — R252 Cramp and spasm: Secondary | ICD-10-CM | POA: Diagnosis not present

## 2020-06-25 DIAGNOSIS — R293 Abnormal posture: Secondary | ICD-10-CM | POA: Diagnosis not present

## 2020-06-25 NOTE — Therapy (Signed)
[facility] Outpatient Rehabilitation [facility] [address] [place], [place], [address] Phone: [phone]   Fax:  [phone]  Physical Therapy Treatment  Patient Details  Name: Gail Blackburn MRN: [id number] Date of Birth: 07/16/1976 Referring Provider (PT): Shawnie Dapper, NP   Encounter Date: 06/25/2020   PT End of Session - 06/25/20 1411    Visit Number 5    Date for PT Re-Evaluation 07/18/20    Authorization Time Period 9/21-11/1    Authorization - Visit Number 3    Authorization - Number of Visits 12    PT Start Time 1410    PT Stop Time 1442    PT Time Calculation (min) 32 min    Activity Tolerance Patient tolerated treatment well    Behavior During Therapy [facility] for tasks assessed/performed           Past Medical History:  Diagnosis Date  . Anemia    PRIOR HISTORY  . Anxiety   . Asthma   . Bipolar disorder (HCC)    Borderline  . Depression   . Fibromyalgia   . Headache    MIGRAINES  . Hypothyroidism 2009   TOOK MEDS FOR FEW WEEKS NO MEDS NOW  . Migraine   . Thyroid disease    treated in past not sure if too high or too low    Past Surgical History:  Procedure Laterality Date  . COLONOSCOPY    . DILATION AND EVACUATION N/A 05/06/2016   Procedure: DILATATION AND EVACUATION;  Surgeon: Huel Cote, MD;  Location: [facility];  Service: Gynecology;  Laterality: N/A;  . FOOT SURGERY    . TEETH PULLED  03/2016  . WISDOM TOOTH EXTRACTION      There were no vitals filed for this visit.   Subjective Assessment - 06/25/20 1412    Subjective Migraine behind Lt eye started around 11:00 today. Was sore after DN last visit but pain returned on Rt cervical suboccipital region and Lt periscapular region. Is under a lot of stress. Lt scapula 8/10. Celebrex around 1200 today.    Patient Stated Goals decrease tension & migraines    Currently in Pain? Yes    Pain Score 6     Pain Location Neck    Pain Orientation Right    Pain Descriptors / Indicators  Tightness    Aggravating Factors  stress    Pain Relieving Factors DN                             [facility] Adult PT Treatment/Exercise - 06/25/20 0001      Manual Therapy   Manual therapy comments skilled palpation and monitoring during TPDN    Soft tissue mobilization all muscles following DN, suboccipital release, bil pec stretching; bil upper trap stretching with GHJ AP pressure    Manual Traction cervical            Trigger Point Dry Needling - 06/25/20 0001    Muscles Treated Upper Quadrant Latissimus dorsi    Other Dry Needling Rt suboccipitals    Levator Scapulae Response Twitch response elicited;Palpable increased muscle length   Lt   Infraspinatus Response Twitch response elicited;Palpable increased muscle length   LT   Subscapularis Response Twitch response elicited;Palpable increased muscle length   LT   Latissimus dorsi Response Twitch response elicited;Palpable increased muscle length   LT in prone               PT  Education - 06/25/20 1443    Education Details adding core work to UE bands, postural alignment    Person(s) Educated Patient    Methods Explanation;Demonstration;Verbal cues    Comprehension Verbalized understanding;Returned demonstration;Need further instruction            PT Short Term Goals - 05/29/20 1345      PT SHORT TERM GOAL #1   Title Cervical rotation within 5 deg Rt to Lt to decrease unequal strain    Baseline see flowsheet    Status Achieved      PT SHORT TERM GOAL #2   Title will verbalize performing HEP at least every other day    Baseline about 1/week right now    Status On-going      PT SHORT TERM GOAL #3   Title GHJ ER strength to 5/5    Baseline flexion 5/5, abd 5/5, ER 4+/5, all improved but uncomfortable to test    Status On-going             PT Long Term Goals - 05/29/20 1351      PT LONG TERM GOAL #1   Title pt will be able to drive comfortably without limitation by neck pain    Baseline  very uncomfortable    Time 8   extra time for scheduling/MCD auth   Period Weeks    Status New    Target Date 07/25/20      PT LONG TERM GOAL #2   Title HA/migraines to =<3 days/week    Baseline every day at ERO    Time 8    Period Weeks    Status New    Target Date 07/25/20                 Plan - 06/25/20 1444    Clinical Impression Statement Reported resolution of HA symptoms and pain following treatment today with soreness as expected. Is doing better with upright posture but needs to move back to decrease extensor work which was discussed today. DN is helpful in short term for relief of symptoms but pt denies long term carryover. Stress is likely contributing as she feels she wears her stress in her shoulders.    PT Treatment/Interventions ADLs/Self Care Home Management;Cryotherapy;Electrical Stimulation;Traction;Moist Heat;Iontophoresis 4mg /ml Dexamethasone;Therapeutic activities;Therapeutic exercise;Patient/family education;Neuromuscular re-education;Manual techniques;Taping;Dry needling;Joint Manipulations;Spinal Manipulations    PT Next Visit Plan DN PRN, review gym workouts- machines? pt works with    PT Home Exercise Plan from last episode    Consulted and Agree with Plan of Care Patient           Patient will benefit from skilled therapeutic intervention in order to improve the following deficits and impairments:  Improper body mechanics, Pain, Postural dysfunction, Increased muscle spasms, Decreased activity tolerance, Decreased strength, Impaired UE functional use, Hypermobility  Visit Diagnosis: Migraine without aura and with status migrainosus, not intractable  Cervicalgia  Abnormal posture     Problem List Patient Active Problem List   Diagnosis Date Noted  . UTI (urinary tract infection) 12/19/2019  . Tinea manuum 05/21/2019  . Abdominal wall lump 02/21/2018  . Chronic fatigue syndrome with fibromyalgia 02/24/2017  . Skin lesion of  left leg 02/10/2017  . History of abnormal cervical Pap smear 02/10/2017  . Anxiety 10/15/2014  . Depression 10/15/2014  . Migraine 10/15/2014  . Chronic pain 10/15/2014  . Low TSH level 10/15/2014  . Tobacco use disorder 10/15/2014    Lucille Crichlow C. Haidan Nhan PT, DPT 06/25/20 2:49  PM   [facility] Outpatient Rehabilitation [facility] [address] [place], [place], [address] Phone: [phone]   Fax:  [phone]  Name: Gail Blackburn MRN: [id number] Date of Birth: [birth year]

## 2020-06-30 ENCOUNTER — Ambulatory Visit: Payer: Medicaid Other | Admitting: Physical Therapy

## 2020-07-02 ENCOUNTER — Ambulatory Visit: Payer: Medicaid Other | Admitting: Physical Therapy

## 2020-07-07 ENCOUNTER — Encounter: Payer: Self-pay | Admitting: Physical Therapy

## 2020-07-07 ENCOUNTER — Other Ambulatory Visit: Payer: Self-pay

## 2020-07-07 ENCOUNTER — Ambulatory Visit: Payer: Medicaid Other | Attending: Family Medicine | Admitting: Physical Therapy

## 2020-07-07 DIAGNOSIS — M542 Cervicalgia: Secondary | ICD-10-CM

## 2020-07-07 DIAGNOSIS — G43001 Migraine without aura, not intractable, with status migrainosus: Secondary | ICD-10-CM | POA: Insufficient documentation

## 2020-07-07 DIAGNOSIS — R293 Abnormal posture: Secondary | ICD-10-CM | POA: Insufficient documentation

## 2020-07-07 DIAGNOSIS — M6281 Muscle weakness (generalized): Secondary | ICD-10-CM | POA: Insufficient documentation

## 2020-07-07 NOTE — Therapy (Signed)
[facility] [address] [place], [place], [address] Phone: [phone]   Fax:  [phone]  Physical Therapy Treatment  Patient Details  Name: Gail Blackburn MRN: [id number] Date of Birth: Jul 22, 1976 Referring Provider (PT): Shawnie Dapper, NP   Encounter Date: 07/07/2020   PT End of Session - 07/07/20 1636    Visit Number 6    Date for PT Re-Evaluation 07/18/20    Authorization Type [employer] [place]    Authorization Time Period 9/21-11/1    Authorization - Visit Number 4    Authorization - Number of Visits 12    PT Start Time 1630    PT Stop Time 1709    PT Time Calculation (min) 39 min    Activity Tolerance Patient tolerated treatment well    Behavior During Therapy [facility] for tasks assessed/performed           Past Medical History:  Diagnosis Date  . Anemia    PRIOR HISTORY  . Anxiety   . Asthma   . Bipolar disorder (HCC)    Borderline  . Depression   . Fibromyalgia   . Headache    MIGRAINES  . Hypothyroidism 2009   TOOK MEDS FOR FEW WEEKS NO MEDS NOW  . Migraine   . Thyroid disease    treated in past not sure if too high or too low    Past Surgical History:  Procedure Laterality Date  . COLONOSCOPY    . DILATION AND EVACUATION N/A 05/06/2016   Procedure: DILATATION AND EVACUATION;  Surgeon: Huel Cote, MD;  Location: [facility];  Service: Gynecology;  Laterality: N/A;  . FOOT SURGERY    . TEETH PULLED  03/2016  . WISDOM TOOTH EXTRACTION      There were no vitals filed for this visit.   Subjective Assessment - 07/07/20 1633    Subjective Left trap is 9/10 after being sick. Trying to urinate creates HA pain.    Patient Stated Goals decrease tension & migraines    Currently in Pain? Yes    Pain Score 9     Pain Location Neck    Pain Orientation Left    Pain Descriptors / Indicators Tightness    Aggravating Factors  stress, coughing from being sick    Pain Relieving Factors DN, workouts help a little               [facility] PT Assessment - 07/07/20 0001      AROM   Cervical - Right Rotation 50   passive 78   Cervical - Left Rotation 40   passive 70                        [facility] Adult PT Treatment/Exercise - 07/07/20 0001      Shoulder Exercises: Seated   Other Seated Exercises V-ups to reduce pull from cervical region & engage core      Shoulder Exercises: Stretch   Other Shoulder Stretches thoracic ext over chair with deep breathing also done supine over pillow    Other Shoulder Stretches cervical rotation snag, upper trap stretching      Manual Therapy   Manual Therapy Taping    Manual therapy comments skilled palpation and monitoring during TPDN    Joint Mobilization thoracic PA & lateral mobs with most focus on T4, Lt first rib depression    Soft tissue mobilization Lt upper trap    McConnell Lt upper trap inhibition with depression  on first rib            Trigger Point Dry Needling - 07/07/20 0001    Upper Trapezius Response Twitch reponse elicited;Palpable increased muscle length   Lt                 PT Short Term Goals - 05/29/20 1345      PT SHORT TERM GOAL #1   Title Cervical rotation within 5 deg Rt to Lt to decrease unequal strain    Baseline see flowsheet    Status Achieved      PT SHORT TERM GOAL #2   Title will verbalize performing HEP at least every other day    Baseline about 1/week right now    Status On-going      PT SHORT TERM GOAL #3   Title GHJ ER strength to 5/5    Baseline flexion 5/5, abd 5/5, ER 4+/5, all improved but uncomfortable to test    Status On-going             PT Long Term Goals - 05/29/20 1351      PT LONG TERM GOAL #1   Title pt will be able to drive comfortably without limitation by neck pain    Baseline very uncomfortable    Time 8   extra time for scheduling/MCD auth   Period Weeks    Status New    Target Date 07/25/20      PT LONG TERM GOAL #2   Title HA/migraines to =<3 days/week    Baseline  every day at ERO    Time 8    Period Weeks    Status New    Target Date 07/25/20                 Plan - 07/07/20 1712    Clinical Impression Statement Limited in DN need today but benefited most from joint mobilization. A large amount of time was spent on mobilization of thoracic region with focus on T4 PA & Rt lateral PA. Pt has been sick and coughing which is consistent with Lt first rib elevation and tightness in thoracic region. Reported feeling 'fine" when she stood after manual therapy. Stretchy end feel for passive cervical rotation so SNAG was used for stretching. Pt has been trying to tone so we reviewed core activation utilizing hip hinge rather than lumbar flexion with forwward head reach.    PT Treatment/Interventions ADLs/Self Care Home Management;Cryotherapy;Electrical Stimulation;Traction;Moist Heat;Iontophoresis 4mg /ml Dexamethasone;Therapeutic activities;Therapeutic exercise;Patient/family education;Neuromuscular re-education;Manual techniques;Taping;Dry needling;Joint Manipulations;Spinal Manipulations    PT Next Visit Plan continue core strengthening, gym machines if she wants to go to a gym    PT Home Exercise Plan from last episode    Consulted and Agree with Plan of Care Patient           Patient will benefit from skilled therapeutic intervention in order to improve the following deficits and impairments:  Improper body mechanics, Pain, Postural dysfunction, Increased muscle spasms, Decreased activity tolerance, Decreased strength, Impaired UE functional use, Hypermobility  Visit Diagnosis: Migraine without aura and with status migrainosus, not intractable  Cervicalgia  Abnormal posture  Muscle weakness (generalized)     Problem List Patient Active Problem List   Diagnosis Date Noted  . UTI (urinary tract infection) 12/19/2019  . Tinea manuum 05/21/2019  . Abdominal wall lump 02/21/2018  . Chronic fatigue syndrome with fibromyalgia 02/24/2017  .  Skin lesion of left leg 02/10/2017  . History of abnormal cervical Pap  smear 02/10/2017  . Anxiety 10/15/2014  . Depression 10/15/2014  . Migraine 10/15/2014  . Chronic pain 10/15/2014  . Low TSH level 10/15/2014  . Tobacco use disorder 10/15/2014    Kinte Trim C. Navah Grondin PT, DPT 07/07/20 5:15 PM   [facility] [address] [place], [place], [address] Phone: [phone]   Fax:  [phone]  Name: Gail Blackburn MRN: [id number] Date of Birth: 1976-05-30

## 2020-07-09 ENCOUNTER — Ambulatory Visit: Payer: Medicaid Other | Admitting: Physical Therapy

## 2020-07-14 ENCOUNTER — Telehealth: Payer: Self-pay | Admitting: Family Medicine

## 2020-07-14 ENCOUNTER — Encounter: Payer: Medicaid Other | Admitting: Physical Therapy

## 2020-07-14 NOTE — Telephone Encounter (Signed)
Patient has a Botox appointment on 10/25. I called [name] to see if Botox delivery could be scheduled. I spoke with Joni Reining, who states they are having difficulty reaching the patient. Joni Reining states they have the incorrect phone number after I provided her with the one we have on file. She attempted to contact the patient to get consent for shipment. Joni Reining states she did not answer. I called the patient and was able to reach her. She states she was on another call when [name] called her. I advised the patient to call the pharmacy and give consent for Botox shipment. She states she will do so.

## 2020-07-15 NOTE — Telephone Encounter (Signed)
I called [name] and spoke with Celine to see if Botox delivery can be scheduled. Celine states that patient has not called to give consent. She placed me on hold and called the patient and was able to reach her. Patient gave consent for shipment. Celine scheduled Botox delivery for 10/21.

## 2020-07-16 ENCOUNTER — Ambulatory Visit: Payer: Medicaid Other | Admitting: Physical Therapy

## 2020-07-16 NOTE — Telephone Encounter (Signed)
I received (2) 100U vials of Botox today from [name] for patient's 10/25 appointment.

## 2020-07-20 ENCOUNTER — Ambulatory Visit: Payer: Medicaid Other | Admitting: Family Medicine

## 2020-07-20 DIAGNOSIS — G43709 Chronic migraine without aura, not intractable, without status migrainosus: Secondary | ICD-10-CM
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
## 2020-07-20 NOTE — Progress Notes (Signed)
Botox-100unitsx2 vials °Lot: [id number] °Expiration: 10/2022 °NDC: 0023-1145-01 ° ° °0.9% Sodium Chloride- 4mL total °Lot: [id number] °Expiration: 06/2022 °NDC: 63323-186-03 ° °Dx: Chronic Migraine °SP ° °Consent signed  ° °

## 2020-07-23 ENCOUNTER — Ambulatory Visit: Payer: Medicaid Other | Admitting: Physical Therapy

## 2020-07-24 ENCOUNTER — Ambulatory Visit: Payer: Medicaid Other | Admitting: Physical Therapy

## 2020-07-28 DIAGNOSIS — J4521 Mild intermittent asthma with (acute) exacerbation: Secondary | ICD-10-CM | POA: Diagnosis not present

## 2020-07-28 DIAGNOSIS — H6691 Otitis media, unspecified, right ear: Secondary | ICD-10-CM | POA: Diagnosis not present

## 2020-07-28 DIAGNOSIS — L258 Unspecified contact dermatitis due to other agents: Secondary | ICD-10-CM | POA: Diagnosis not present

## 2020-07-29 DIAGNOSIS — Z79891 Long term (current) use of opiate analgesic: Secondary | ICD-10-CM | POA: Diagnosis not present

## 2020-07-31 ENCOUNTER — Other Ambulatory Visit: Payer: Self-pay | Admitting: Neurology

## 2020-08-07 ENCOUNTER — Ambulatory Visit: Payer: Medicaid Other | Attending: Family Medicine | Admitting: Physical Therapy

## 2020-08-07 ENCOUNTER — Encounter: Payer: Self-pay | Admitting: Physical Therapy

## 2020-08-07 ENCOUNTER — Other Ambulatory Visit: Payer: Self-pay

## 2020-08-07 DIAGNOSIS — M6281 Muscle weakness (generalized): Secondary | ICD-10-CM | POA: Diagnosis not present

## 2020-08-07 DIAGNOSIS — G43001 Migraine without aura, not intractable, with status migrainosus: Secondary | ICD-10-CM | POA: Insufficient documentation

## 2020-08-07 DIAGNOSIS — R252 Cramp and spasm: Secondary | ICD-10-CM | POA: Diagnosis not present

## 2020-08-07 DIAGNOSIS — M542 Cervicalgia: Secondary | ICD-10-CM | POA: Insufficient documentation

## 2020-08-07 DIAGNOSIS — R293 Abnormal posture: Secondary | ICD-10-CM | POA: Diagnosis not present

## 2020-08-07 NOTE — Therapy (Signed)
[facility] Outpatient Rehabilitation [facility] [address] [place], [place], [address] Phone: [phone]   Fax:  [phone]  Physical Therapy Treatment/Re-evaluation  Patient Details  Name: Gail Blackburn MRN: [id number] Date of Birth: 12-29-1975 Referring Provider (PT): Shawnie Dapper, NP   Encounter Date: 08/07/2020   PT End of Session - 08/07/20 0809    Visit Number 7    Date for PT Re-Evaluation 09/25/20    Authorization Type MCD of - auth submitted 11/12    PT Start Time 0806    PT Stop Time 0834    PT Time Calculation (min) 28 min    Activity Tolerance Patient tolerated treatment well    Behavior During Therapy [facility] for tasks assessed/performed           Past Medical History:  Diagnosis Date  . Anemia    PRIOR HISTORY  . Anxiety   . Asthma   . Bipolar disorder (HCC)    Borderline  . Depression   . Fibromyalgia   . Headache    MIGRAINES  . Hypothyroidism 2009   TOOK MEDS FOR FEW WEEKS NO MEDS NOW  . Migraine   . Thyroid disease    treated in past not sure if too high or too low    Past Surgical History:  Procedure Laterality Date  . COLONOSCOPY    . DILATION AND EVACUATION N/A 05/06/2016   Procedure: DILATATION AND EVACUATION;  Surgeon: Huel Cote, MD;  Location: [facility];  Service: Gynecology;  Laterality: N/A;  . FOOT SURGERY    . TEETH PULLED  03/2016  . WISDOM TOOTH EXTRACTION      There were no vitals filed for this visit.   Subjective Assessment - 08/07/20 0807    Subjective Some days are worse, today 6-7/10. Middle of night Rt shoulder was pinching- was laying on Lt side. Stress levels are very high.    Patient Stated Goals decrease tension & migraines    Currently in Pain? Yes    Pain Score 7     Pain Location Shoulder    Pain Orientation Right;Left    Pain Descriptors / Indicators --   tension   Aggravating Factors  stress    Pain Relieving Factors DN              [facility] PT Assessment - 08/07/20 0001       Assessment   Medical Diagnosis cervicalgia, migraines    Referring Provider (PT) Shawnie Dapper, NP    Onset Date/Surgical Date --   approx 2015   Hand Dominance Right      Precautions   Precautions None      Restrictions   Weight Bearing Restrictions No      Balance Screen   Has the patient fallen in the past 6 months Yes    How many times? 1    Has the patient had a decrease in activity level because of a fear of falling?  No    Is the patient reluctant to leave their home because of a fear of falling?  No      Home Tourist information centre manager residence      Prior Function   Level of Independence Independent    Vocation Requirements not working      Cognition   Overall Cognitive Status Within Functional Limits for tasks assessed      Observation/Other Assessments   Focus on Therapeutic Outcomes (FOTO)  n/a      Sensation  Additional Comments numbness in Rt scapula      Posture/Postural Control   Posture Comments resolved winging scapulae, able to correct posture without cues, elevated and rouned shoulders bilaterally      AROM   Overall AROM Comments PT able to obtain approx 20 more deg of rot bilat with stretchy end feel    Cervical - Right Rotation 56    Cervical - Left Rotation 54      Palpation   Palpation comment GHJ & cervical PROM stretchy end feels                                   PT Short Term Goals - 05/29/20 1345      PT SHORT TERM GOAL #1   Title Cervical rotation within 5 deg Rt to Lt to decrease unequal strain    Baseline see flowsheet    Status Achieved      PT SHORT TERM GOAL #2   Title will verbalize performing HEP at least every other day    Baseline about 1/week right now    Status On-going      PT SHORT TERM GOAL #3   Title GHJ ER strength to 5/5    Baseline flexion 5/5, abd 5/5, ER 4+/5, all improved but uncomfortable to test    Status On-going             PT Long Term Goals - 08/07/20 0814       PT LONG TERM GOAL #1   Title pt will be able to drive comfortably without limitation by neck pain    Baseline moderate discomfort, improved ability to put arm around seat to help turn to look    Time 6    Period Weeks    Status On-going    Target Date 09/25/20      PT LONG TERM GOAL #2   Title HA/migraines to =<3 days/week    Baseline 3 days/week on average    Status Achieved      PT LONG TERM GOAL #3   Title Average pain with ADLs <=4/10    Baseline average 6-7/10 with spikes    Time 6    Period Weeks    Status New    Target Date 09/25/20                 Plan - 08/07/20 0836    Clinical Impression Statement Pt has made progress toward goals as she has improved ability to drive and is experiencing a decrease in frequency in migraines overall. General joint mobility is much better with springy end feels indicating primary limitations are in muscular tension. Pt responds very well to DN to decrease pain levels and is doing well with her HEP. Admits that her stress levels are a primary limiting factor in progress carryover and is doing her best to work on it. Will cont to benefit from skilled PT to continue progression toward migraine control and overall postural alignment & strength.    PT Frequency 1x / week    PT Duration 6 weeks    PT Treatment/Interventions ADLs/Self Care Home Management;Cryotherapy;Electrical Stimulation;Traction;Moist Heat;Iontophoresis 4mg /ml Dexamethasone;Therapeutic activities;Therapeutic exercise;Patient/family education;Neuromuscular re-education;Manual techniques;Taping;Dry needling;Joint Manipulations;Spinal Manipulations    PT Next Visit Plan continue core strengthening, gym machines if she wants to go to a gym, DN PRN    PT Home Exercise Plan from last episode    Consulted and Agree  with Plan of Care Patient           Patient will benefit from skilled therapeutic intervention in order to improve the following deficits and impairments:   Improper body mechanics, Pain, Postural dysfunction, Increased muscle spasms, Decreased activity tolerance, Decreased strength, Impaired UE functional use, Hypermobility  Visit Diagnosis: Migraine without aura and with status migrainosus, not intractable - Plan: PT plan of care cert/re-cert  Cervicalgia - Plan: PT plan of care cert/re-cert  Abnormal posture - Plan: PT plan of care cert/re-cert  Muscle weakness (generalized) - Plan: PT plan of care cert/re-cert  Cramp and spasm - Plan: PT plan of care cert/re-cert     Problem List Patient Active Problem List   Diagnosis Date Noted  . UTI (urinary tract infection) 12/19/2019  . Tinea manuum 05/21/2019  . Abdominal wall lump 02/21/2018  . Chronic fatigue syndrome with fibromyalgia 02/24/2017  . Skin lesion of left leg 02/10/2017  . History of abnormal cervical Pap smear 02/10/2017  . Anxiety 10/15/2014  . Depression 10/15/2014  . Migraine 10/15/2014  . Chronic pain 10/15/2014  . Low TSH level 10/15/2014  . Tobacco use disorder 10/15/2014    Marci Polito C. Kimiye Strathman PT, DPT 08/07/20 8:43 AM   [facility] Outpatient Rehabilitation [facility] [address] [place], [place], [address] Phone: [phone]   Fax:  [phone]  Name: Gail Blackburn MRN: [id number] Date of Birth: [birth year]

## 2020-08-11 ENCOUNTER — Other Ambulatory Visit: Payer: Self-pay

## 2020-08-11 MED ORDER — ONDANSETRON HCL 4 MG PO TABS: ORAL_TABLET | ORAL | 0 refills | Status: DC

## 2020-08-28 ENCOUNTER — Ambulatory Visit: Payer: Medicaid Other | Admitting: Physical Therapy

## 2020-09-02 ENCOUNTER — Telehealth: Payer: Self-pay | Admitting: Student in an Organized Health Care Education/Training Program

## 2020-09-02 NOTE — Telephone Encounter (Signed)
°   Gail Blackburn DOB: 03-Sep-1976 MRN: [id number]   RIDER WAIVER AND RELEASE OF LIABILITY  For purposes of improving physical access to our facilities, [facility] is pleased to partner with third parties to provide [facility] patients or other authorized individuals the option of convenient, on-demand ground transportation services (the [employer]) through use of the technology service that enables users to request on-demand ground transportation from independent third-party providers.  By opting to use and accept these [employer], I, the undersigned, hereby agree on behalf of myself, and on behalf of any minor child using the [employer] for whom I am the parent or legal guardian, as follows:  1. Science writer provided to me are provided by independent third-party transportation providers who are not [employer] or employees and who are unaffiliated with [employer]. 2. [place] [facility] is neither a transportation carrier nor a common or public carrier. 3. [facility] has no control over the quality or safety of the transportation that occurs as a result of the [employer]. 4. [facility] cannot guarantee that any third-party transportation provider will complete any arranged transportation service. 5. [facility] makes no representation, warranty, or guarantee regarding the reliability, timeliness, quality, safety, suitability, or availability of any of the Transport Services or that they will be error free. 6. I fully understand that traveling by vehicle involves risks and dangers of serious bodily injury, including permanent disability, paralysis, and death. I agree, on behalf of myself and on behalf of any minor child using the Transport Services for whom I am the parent or legal guardian, that the entire risk arising out of my use of the [employer] remains solely with me, to the maximum extent permitted under applicable law. 7. The [employer]  are provided as is and as available. [place] disclaims all representations and warranties, express, implied or statutory, not expressly set out in these terms, including the implied warranties of merchantability and fitness for a particular purpose. 8. I hereby waive and release [facility], its agents, employees, officers, directors, representatives, insurers, attorneys, assigns, successors, subsidiaries, and affiliates from any and all past, present, or future claims, demands, liabilities, actions, causes of action, or suits of any kind directly or indirectly arising from acceptance and use of the [employer]. 9. I further waive and release [place] and its affiliates from all present and future liability and responsibility for any injury or death to persons or damages to property caused by or related to the use of the [employer]. 10. I have read this Waiver and Release of Liability, and I understand the terms used in it and their legal significance. This Waiver is freely and voluntarily given with the understanding that my right (as well as the right of any minor child for whom I am the parent or legal guardian using the [employer]) to legal recourse against [place] in connection with the [employer] is knowingly surrendered in return for use of these services.   I attest that I read the consent document to Gail Blackburn, gave Ms. Toft the opportunity to ask questions and answered the questions asked (if any). I affirm that Gail Blackburn then provided consent for she's participation in this program.     Gail Blackburn

## 2020-09-03 ENCOUNTER — Other Ambulatory Visit: Payer: Self-pay

## 2020-09-03 ENCOUNTER — Ambulatory Visit: Payer: Medicaid Other | Attending: Family Medicine | Admitting: Physical Therapy

## 2020-09-03 ENCOUNTER — Encounter: Payer: Self-pay | Admitting: Physical Therapy

## 2020-09-03 DIAGNOSIS — G43001 Migraine without aura, not intractable, with status migrainosus: Secondary | ICD-10-CM | POA: Insufficient documentation

## 2020-09-03 DIAGNOSIS — M542 Cervicalgia: Secondary | ICD-10-CM | POA: Diagnosis not present

## 2020-09-03 DIAGNOSIS — R293 Abnormal posture: Secondary | ICD-10-CM | POA: Insufficient documentation

## 2020-09-03 DIAGNOSIS — M6281 Muscle weakness (generalized): Secondary | ICD-10-CM | POA: Diagnosis not present

## 2020-09-03 NOTE — Therapy (Signed)
[facility] Outpatient Rehabilitation [facility] [address] [place], [place], [address] Phone: [phone]   Fax:  [phone]  Physical Therapy Treatment  Patient Details  Name: Gail Blackburn MRN: [id number] Date of Birth: [birth year] Referring Provider (PT): Shawnie Dapper, NP   Encounter Date: 09/03/2020   PT End of Session - 09/03/20 1749    Visit Number 8    Date for PT Re-Evaluation 09/25/20    Authorization Type MCD of [place]- auth pending    PT Start Time 1752    PT Stop Time 1829    PT Time Calculation (min) 37 min    Activity Tolerance Patient tolerated treatment well    Behavior During Therapy [facility] for tasks assessed/performed           Past Medical History:  Diagnosis Date  . Anemia    PRIOR HISTORY  . Anxiety   . Asthma   . Bipolar disorder (HCC)    Borderline  . Depression   . Fibromyalgia   . Headache    MIGRAINES  . Hypothyroidism 2009   TOOK MEDS FOR FEW WEEKS NO MEDS NOW  . Migraine   . Thyroid disease    treated in past not sure if too high or too low    Past Surgical History:  Procedure Laterality Date  . COLONOSCOPY    . DILATION AND EVACUATION N/A 05/06/2016   Procedure: DILATATION AND EVACUATION;  Surgeon: Huel Cote, MD;  Location: [facility];  Service: Gynecology;  Laterality: N/A;  . FOOT SURGERY    . TEETH PULLED  03/2016  . WISDOM TOOTH EXTRACTION      There were no vitals filed for this visit.   Subjective Assessment - 09/03/20 1752    Subjective My tension is terrible. Having migraines and taking meds for last 3 days.    Patient Stated Goals decrease tension & migraines    Currently in Pain? Yes    Pain Score 8     Pain Location Shoulder    Pain Orientation Right    Pain Descriptors / Indicators --   tension   Aggravating Factors  stress    Pain Relieving Factors DN              [facility] PT Assessment - 09/03/20 0001      AROM   Overall AROM Comments maintained resolution of winging scapula    Cervical -  Right Rotation 60    Cervical - Left Rotation 46                         [facility] Adult PT Treatment/Exercise - 09/03/20 0001      Modalities   Modalities Traction      Traction   Type of Traction Cervical    Min (lbs) 5    Max (lbs) 15    Hold Time 60s    Rest Time 20s    Time 15 min      Manual Therapy   Manual therapy comments skilled palpation and monitoring during TPDN    Joint Mobilization Rt first rib depression, gross thoracic and rib cage PA    Soft tissue mobilization Rt upper trap            Trigger Point Dry Needling - 09/03/20 0001    Upper Trapezius Response Twitch reponse elicited   Right                 PT Short Term Goals -  05/29/20 1345      PT SHORT TERM GOAL #1   Title Cervical rotation within 5 deg Rt to Lt to decrease unequal strain    Baseline see flowsheet    Status Achieved      PT SHORT TERM GOAL #2   Title will verbalize performing HEP at least every other day    Baseline about 1/week right now    Status On-going      PT SHORT TERM GOAL #3   Title GHJ ER strength to 5/5    Baseline flexion 5/5, abd 5/5, ER 4+/5, all improved but uncomfortable to test    Status On-going             PT Long Term Goals - 08/07/20 0814      PT LONG TERM GOAL #1   Title pt will be able to drive comfortably without limitation by neck pain    Baseline moderate discomfort, improved ability to put arm around seat to help turn to look    Time 6    Period Weeks    Status On-going    Target Date 09/25/20      PT LONG TERM GOAL #2   Title HA/migraines to =<3 days/week    Baseline 3 days/week on average    Status Achieved      PT LONG TERM GOAL #3   Title Average pain with ADLs <=4/10    Baseline average 6-7/10 with spikes    Time 6    Period Weeks    Status New    Target Date 09/25/20                 Plan - 09/03/20 1819    Clinical Impression Statement Able to get a single twitch response from Rt upper trap today  but otherwise not finding trigger points as usual. She did have botos about 3 weeks ago. Notable limitation in cervical, thoracic and rib cage mobility and was placed on traction following manual mobilization. At this time I believe she would benefit from use of chiropractic treatment moreso than dry needling. She is established with a chiropractor and will contact them. She continues to experience very high levels of stress that are contributing to sensation of tightness and migraines.    PT Treatment/Interventions ADLs/Self Care Home Management;Cryotherapy;Electrical Stimulation;Traction;Moist Heat;Iontophoresis 4mg /ml Dexamethasone;Therapeutic activities;Therapeutic exercise;Patient/family education;Neuromuscular re-education;Manual techniques;Taping;Dry needling;Joint Manipulations;Spinal Manipulations    PT Next Visit Plan continue core strengthening, gym machines if she wants to go to a gym, DN PRN    PT Home Exercise Plan from last episode    Consulted and Agree with Plan of Care Patient           Patient will benefit from skilled therapeutic intervention in order to improve the following deficits and impairments:  Improper body mechanics,Pain,Postural dysfunction,Increased muscle spasms,Decreased activity tolerance,Decreased strength,Impaired UE functional use,Hypermobility  Visit Diagnosis: Migraine without aura and with status migrainosus, not intractable  Cervicalgia  Abnormal posture  Muscle weakness (generalized)     Problem List Patient Active Problem List   Diagnosis Date Noted  . UTI (urinary tract infection) 12/19/2019  . Tinea manuum 05/21/2019  . Abdominal wall lump 02/21/2018  . Chronic fatigue syndrome with fibromyalgia 02/24/2017  . Skin lesion of left leg 02/10/2017  . History of abnormal cervical Pap smear 02/10/2017  . Anxiety 10/15/2014  . Depression 10/15/2014  . Migraine 10/15/2014  . Chronic pain 10/15/2014  . Low TSH level 10/15/2014  . Tobacco use  disorder  10/15/2014    Friend Dorfman C. Morgen Ritacco PT, DPT 09/03/20 6:37 PM   [facility] Outpatient Rehabilitation [facility] [address] [place], [place], [address] Phone: [phone]   Fax:  [phone]  Name: Gail Blackburn MRN: [id number] Date of Birth: 1976/03/23

## 2020-09-07 DIAGNOSIS — Z23 Encounter for immunization: Secondary | ICD-10-CM | POA: Diagnosis not present

## 2020-09-07 DIAGNOSIS — Z3041 Encounter for surveillance of contraceptive pills: Secondary | ICD-10-CM | POA: Diagnosis not present

## 2020-09-07 DIAGNOSIS — A609 Anogenital herpesviral infection, unspecified: Secondary | ICD-10-CM | POA: Diagnosis not present

## 2020-09-07 DIAGNOSIS — Z113 Encounter for screening for infections with a predominantly sexual mode of transmission: Secondary | ICD-10-CM | POA: Diagnosis not present

## 2020-09-07 DIAGNOSIS — Z9889 Other specified postprocedural states: Secondary | ICD-10-CM | POA: Diagnosis not present

## 2020-09-07 DIAGNOSIS — Z1231 Encounter for screening mammogram for malignant neoplasm of breast: Secondary | ICD-10-CM | POA: Diagnosis not present

## 2020-09-08 DIAGNOSIS — Z9889 Other specified postprocedural states: Secondary | ICD-10-CM | POA: Diagnosis not present

## 2020-09-10 ENCOUNTER — Ambulatory Visit: Payer: Medicaid Other | Admitting: Physical Therapy

## 2020-09-17 ENCOUNTER — Encounter: Payer: Self-pay | Admitting: Physical Therapy

## 2020-09-17 ENCOUNTER — Other Ambulatory Visit: Payer: Self-pay

## 2020-09-17 ENCOUNTER — Ambulatory Visit: Payer: Medicaid Other | Admitting: Physical Therapy

## 2020-09-17 DIAGNOSIS — M6281 Muscle weakness (generalized): Secondary | ICD-10-CM | POA: Diagnosis not present

## 2020-09-17 DIAGNOSIS — G43001 Migraine without aura, not intractable, with status migrainosus: Secondary | ICD-10-CM

## 2020-09-17 DIAGNOSIS — R293 Abnormal posture: Secondary | ICD-10-CM

## 2020-09-17 DIAGNOSIS — M542 Cervicalgia: Secondary | ICD-10-CM

## 2020-09-17 NOTE — Therapy (Signed)
[facility] Outpatient Rehabilitation [facility] [address] [place], [place], [address] Phone: [phone]   Fax:  [phone]  Physical Therapy Treatment  Patient Details  Name: Gail Blackburn MRN: [id number] Date of Birth: March 12, 1976 Referring Provider (PT): Shawnie Dapper, NP   Encounter Date: 09/17/2020   PT End of Session - 09/17/20 1550    Visit Number 9    Date for PT Re-Evaluation 09/25/20    Authorization Time Period 9/21-11/1    Authorization - Visit Number 5    Authorization - Number of Visits 12    PT Start Time 1545    PT Stop Time 1609    PT Time Calculation (min) 24 min    Activity Tolerance Patient tolerated treatment well    Behavior During Therapy [facility] for tasks assessed/performed           Past Medical History:  Diagnosis Date   Anemia    PRIOR HISTORY   Anxiety    Asthma    Bipolar disorder (HCC)    Borderline   Depression    Fibromyalgia    Headache    MIGRAINES   Hypothyroidism 2009   TOOK MEDS FOR FEW WEEKS NO MEDS NOW   Migraine    Thyroid disease    treated in past not sure if too high or too low    Past Surgical History:  Procedure Laterality Date   COLONOSCOPY     DILATION AND EVACUATION N/A 05/06/2016   Procedure: DILATATION AND EVACUATION;  Surgeon: Huel Cote, MD;  Location: [facility];  Service: Gynecology;  Laterality: N/A;   FOOT SURGERY     TEETH PULLED  03/2016   WISDOM TOOTH EXTRACTION      There were no vitals filed for this visit.   Subjective Assessment - 09/17/20 1548    Subjective I have a big knot on Rt side of neck. I had a migraine for 3 days straight but it is time for my shot. Lt side of head and behind eye to Lt side of neck with migraine with vision changes. Has not been to chriopractor but has been able to create cavitations independently and feels less stiff.    Patient Stated Goals decrease tension & migraines    Currently in Pain? Yes    Pain Score 5     Pain Location Neck     Pain Orientation Right    Pain Descriptors / Indicators Tightness    Aggravating Factors  Rt sidebend    Pain Relieving Factors DN                             [facility] Adult PT Treatment/Exercise - 09/17/20 0001      Manual Therapy   Manual therapy comments skilled palpation and monitoring during TPDN    Joint Mobilization Rt first rib depression, Rt rib mobility, lateral cervical glides    Soft tissue mobilization Rt upper trap, cervical paraspinals, subocciptial release    Manual Traction cervical            Trigger Point Dry Needling - 09/17/20 0001    Other Dry Needling Rt cervical paraspinals C3-6    Upper Trapezius Response Twitch reponse elicited   Rt                 PT Short Term Goals - 05/29/20 1345      PT SHORT TERM GOAL #1   Title Cervical rotation within 5 deg  Rt to Lt to decrease unequal strain    Baseline see flowsheet    Status Achieved      PT SHORT TERM GOAL #2   Title will verbalize performing HEP at least every other day    Baseline about 1/week right now    Status On-going      PT SHORT TERM GOAL #3   Title GHJ ER strength to 5/5    Baseline flexion 5/5, abd 5/5, ER 4+/5, all improved but uncomfortable to test    Status On-going             PT Long Term Goals - 08/07/20 0814      PT LONG TERM GOAL #1   Title pt will be able to drive comfortably without limitation by neck pain    Baseline moderate discomfort, improved ability to put arm around seat to help turn to look    Time 6    Period Weeks    Status On-going    Target Date 09/25/20      PT LONG TERM GOAL #2   Title HA/migraines to =<3 days/week    Baseline 3 days/week on average    Status Achieved      PT LONG TERM GOAL #3   Title Average pain with ADLs <=4/10    Baseline average 6-7/10 with spikes    Time 6    Period Weeks    Status New    Target Date 09/25/20                 Plan - 09/17/20 1613    Clinical Impression Statement Twitch  respones in Rt upper trap today. Noted that Rt shoulder is more rounded than Lt but overall demo a more appropriate posture. We discussed the importance of quitting smoking to assist in treatment of migraines and pt verbalized awareness.    PT Treatment/Interventions ADLs/Self Care Home Management;Cryotherapy;Electrical Stimulation;Traction;Moist Heat;Iontophoresis 4mg /ml Dexamethasone;Therapeutic activities;Therapeutic exercise;Patient/family education;Neuromuscular re-education;Manual techniques;Taping;Dry needling;Joint Manipulations;Spinal Manipulations    PT Next Visit Plan DN PRN    PT Home Exercise Plan from last episode    Consulted and Agree with Plan of Care Patient           Patient will benefit from skilled therapeutic intervention in order to improve the following deficits and impairments:  Improper body mechanics,Pain,Postural dysfunction,Increased muscle spasms,Decreased activity tolerance,Decreased strength,Impaired UE functional use,Hypermobility  Visit Diagnosis: Migraine without aura and with status migrainosus, not intractable  Cervicalgia  Abnormal posture  Muscle weakness (generalized)     Problem List Patient Active Problem List   Diagnosis Date Noted   UTI (urinary tract infection) 12/19/2019   Tinea manuum 05/21/2019   Abdominal wall lump 02/21/2018   Chronic fatigue syndrome with fibromyalgia 02/24/2017   Skin lesion of left leg 02/10/2017   History of abnormal cervical Pap smear 02/10/2017   Anxiety 10/15/2014   Depression 10/15/2014   Migraine 10/15/2014   Chronic pain 10/15/2014   Low TSH level 10/15/2014   Tobacco use disorder 10/15/2014    Gail Blackburn C. Gail Blackburn PT, DPT 09/17/20 4:15 PM   [facility] Outpatient Rehabilitation [facility] [address] [place], [place], [place] Phone: [phone]   Fax:  [phone]  Name: Gail Blackburn MRN: Angeline Slim Date of Birth: 04-Aug-1976

## 2020-09-22 ENCOUNTER — Encounter: Payer: Self-pay | Admitting: Physical Therapy

## 2020-09-22 ENCOUNTER — Ambulatory Visit: Payer: Medicaid Other | Admitting: Physical Therapy

## 2020-09-22 ENCOUNTER — Other Ambulatory Visit: Payer: Self-pay

## 2020-09-22 DIAGNOSIS — M542 Cervicalgia: Secondary | ICD-10-CM | POA: Diagnosis not present

## 2020-09-22 DIAGNOSIS — G43001 Migraine without aura, not intractable, with status migrainosus: Secondary | ICD-10-CM

## 2020-09-22 DIAGNOSIS — M6281 Muscle weakness (generalized): Secondary | ICD-10-CM | POA: Diagnosis not present

## 2020-09-22 DIAGNOSIS — R293 Abnormal posture: Secondary | ICD-10-CM

## 2020-09-22 NOTE — Therapy (Signed)
[facility] Outpatient Rehabilitation [facility] [address] [place], [place], [address] Phone: [phone]   Fax:  [phone]  Physical Therapy Treatment  Patient Details  Name: Gail Blackburn MRN: [id number] Date of Birth: 1976/08/15 Referring Provider (PT): Shawnie Dapper, NP   Encounter Date: 09/22/2020   PT End of Session - 09/22/20 1401    Visit Number 10    Date for PT Re-Evaluation 09/25/20    Authorization Type [employer] of [place]    Authorization Time Period 12/3-12/30    Authorization - Visit Number 3    Authorization - Number of Visits 4    PT Start Time 1331    PT Stop Time 1355    PT Time Calculation (min) 24 min    Activity Tolerance Patient tolerated treatment well    Behavior During Therapy [facility] for tasks assessed/performed           Past Medical History:  Diagnosis Date  . Anemia    PRIOR HISTORY  . Anxiety   . Asthma   . Bipolar disorder (HCC)    Borderline  . Depression   . Fibromyalgia   . Headache    MIGRAINES  . Hypothyroidism 2009   TOOK MEDS FOR FEW WEEKS NO MEDS NOW  . Migraine   . Thyroid disease    treated in past not sure if too high or too low    Past Surgical History:  Procedure Laterality Date  . COLONOSCOPY    . DILATION AND EVACUATION N/A 05/06/2016   Procedure: DILATATION AND EVACUATION;  Surgeon: Huel Cote, MD;  Location: [facility];  Service: Gynecology;  Laterality: N/A;  . FOOT SURGERY    . TEETH PULLED  03/2016  . WISDOM TOOTH EXTRACTION      There were no vitals filed for this visit.   Subjective Assessment - 09/22/20 1334    Subjective The left side is fine, the right side is bad.                             [facility] Adult PT Treatment/Exercise - 09/22/20 0001      Manual Therapy   Manual therapy comments skilled palpation and monitoring during TPDN    Joint Mobilization rt first rib mobilization, rt rib cage ER, thoracic PA with multiple cavitations noted    Soft tissue mobilization Rt  upper trap, levator, mid trap, rhomboids            Trigger Point Dry Needling - 09/22/20 0001    Upper Trapezius Response Twitch reponse elicited;Palpable increased muscle length   Right   Rhomboids Response Twitch response elicited;Palpable increased muscle length   Right                 PT Short Term Goals - 05/29/20 1345      PT SHORT TERM GOAL #1   Title Cervical rotation within 5 deg Rt to Lt to decrease unequal strain    Baseline see flowsheet    Status Achieved      PT SHORT TERM GOAL #2   Title will verbalize performing HEP at least every other day    Baseline about 1/week right now    Status On-going      PT SHORT TERM GOAL #3   Title GHJ ER strength to 5/5    Baseline flexion 5/5, abd 5/5, ER 4+/5, all improved but uncomfortable to test    Status On-going  PT Long Term Goals - 08/07/20 0814      PT LONG TERM GOAL #1   Title pt will be able to drive comfortably without limitation by neck pain    Baseline moderate discomfort, improved ability to put arm around seat to help turn to look    Time 6    Period Weeks    Status On-going    Target Date 09/25/20      PT LONG TERM GOAL #2   Title HA/migraines to =<3 days/week    Baseline 3 days/week on average    Status Achieved      PT LONG TERM GOAL #3   Title Average pain with ADLs <=4/10    Baseline average 6-7/10 with spikes    Time 6    Period Weeks    Status New    Target Date 09/25/20                 Plan - 09/22/20 1402    Clinical Impression Statement A few trigger points were addressed today in Rt upper trap and around periscap region but most relief brought by mobilization to ribs and cavitations in thoracic region. pt reported feeling like she could move easier and turn her head with less discomfort. Overall feels that migraines are improving but continues to wear stress in her shoulders.    PT Treatment/Interventions ADLs/Self Care Home  Management;Cryotherapy;Electrical Stimulation;Traction;Moist Heat;Iontophoresis 4mg /ml Dexamethasone;Therapeutic activities;Therapeutic exercise;Patient/family education;Neuromuscular re-education;Manual techniques;Taping;Dry needling;Joint Manipulations;Spinal Manipulations    PT Next Visit Plan DN PRN    PT Home Exercise Plan from last episode    Consulted and Agree with Plan of Care Patient           Patient will benefit from skilled therapeutic intervention in order to improve the following deficits and impairments:  Improper body mechanics,Pain,Postural dysfunction,Increased muscle spasms,Decreased activity tolerance,Decreased strength,Impaired UE functional use,Hypermobility  Visit Diagnosis: Migraine without aura and with status migrainosus, not intractable  Cervicalgia  Abnormal posture  Muscle weakness (generalized)     Problem List Patient Active Problem List   Diagnosis Date Noted  . UTI (urinary tract infection) 12/19/2019  . Tinea manuum 05/21/2019  . Abdominal wall lump 02/21/2018  . Chronic fatigue syndrome with fibromyalgia 02/24/2017  . Skin lesion of left leg 02/10/2017  . History of abnormal cervical Pap smear 02/10/2017  . Anxiety 10/15/2014  . Depression 10/15/2014  . Migraine 10/15/2014  . Chronic pain 10/15/2014  . Low TSH level 10/15/2014  . Tobacco use disorder 10/15/2014    Kiaraliz Rafuse C. Gissela Bloch PT, DPT 09/22/20 2:07 PM   [facility] Outpatient Rehabilitation [facility] [address] [place], [place], [place] Phone: [phone]   Fax:  [phone]  Name: Krystalynn [place] MRN: Angeline Slim Date of Birth: 1976-01-29

## 2020-09-26 DIAGNOSIS — K769 Liver disease, unspecified: Secondary | ICD-10-CM

## 2020-09-26 HISTORY — DX: Liver disease, unspecified: K76.9

## 2020-09-30 ENCOUNTER — Ambulatory Visit: Payer: Medicaid Other | Admitting: Physical Therapy

## 2020-10-06 ENCOUNTER — Telehealth: Payer: Self-pay | Admitting: Family Medicine

## 2020-10-06 NOTE — Telephone Encounter (Signed)
Patient has a Botox appointment on 1/26. I called [name] and spoke with Elnita Maxwell to schedule Botox delivery. Botox TBD 1/18.

## 2020-10-08 NOTE — Telephone Encounter (Signed)
Received a call from Jamal at [facility] to reschedule Botox delivery due to [facility] being closed Monday, 1/17. Rescheduled Botox delivery to 1/19.

## 2020-10-14 ENCOUNTER — Other Ambulatory Visit: Payer: Self-pay | Admitting: Neurology

## 2020-10-14 NOTE — Telephone Encounter (Signed)
(  2) 100U vials of Botox delivered today from [name]. 

## 2020-10-21 ENCOUNTER — Ambulatory Visit: Payer: Medicaid Other | Attending: Family Medicine | Admitting: Physical Therapy

## 2020-10-21 ENCOUNTER — Other Ambulatory Visit: Payer: Self-pay

## 2020-10-21 ENCOUNTER — Encounter: Payer: Self-pay | Admitting: Physical Therapy

## 2020-10-21 ENCOUNTER — Ambulatory Visit: Payer: Medicaid Other | Admitting: Family Medicine

## 2020-10-21 DIAGNOSIS — M6281 Muscle weakness (generalized): Secondary | ICD-10-CM | POA: Insufficient documentation

## 2020-10-21 DIAGNOSIS — G43001 Migraine without aura, not intractable, with status migrainosus: Secondary | ICD-10-CM | POA: Insufficient documentation

## 2020-10-21 DIAGNOSIS — G43709 Chronic migraine without aura, not intractable, without status migrainosus: Secondary | ICD-10-CM

## 2020-10-21 DIAGNOSIS — R293 Abnormal posture: Secondary | ICD-10-CM | POA: Insufficient documentation

## 2020-10-21 DIAGNOSIS — M542 Cervicalgia: Secondary | ICD-10-CM | POA: Diagnosis not present

## 2020-10-21 MED ORDER — ONDANSETRON HCL 4 MG PO TABS: ORAL_TABLET | ORAL | 5 refills | Status: DC
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
## 2020-10-21 NOTE — Progress Notes (Addendum)
Botox- 100 units x 2 vial Lot: [id number] Expiration: 10/2022 NDC: 3159-4585-92  Bacteriostatic 0.9% Sodium Chloride- 58mL total [id number] Expiration: 02/23 NDC: 17711-657-90  Dx: X83.338 SP

## 2020-10-22 ENCOUNTER — Encounter: Payer: Self-pay | Admitting: Physical Therapy

## 2020-10-22 NOTE — Therapy (Signed)
[facility] [address] [place], [place], [address] Phone: [phone]   Fax:  [phone]  Physical Therapy Treatment  Patient Details  Name: Gail Blackburn MRN: [id number] Date of Birth: 02/19/1976 Referring Provider (PT): Shawnie Dapper, NP   Encounter Date: 10/21/2020    Past Medical History:  Diagnosis Date   Anemia    PRIOR HISTORY   Anxiety    Asthma    Bipolar disorder (HCC)    Borderline   Depression    Fibromyalgia    Headache    MIGRAINES   Hypothyroidism 2009   TOOK MEDS FOR FEW WEEKS NO MEDS NOW   Migraine    Thyroid disease    treated in past not sure if too high or too low    Past Surgical History:  Procedure Laterality Date   COLONOSCOPY     DILATION AND EVACUATION N/A 05/06/2016   Procedure: DILATATION AND EVACUATION;  Surgeon: Huel Cote, MD;  Location: [facility];  Service: Gynecology;  Laterality: N/A;   FOOT SURGERY     TEETH PULLED  03/2016   WISDOM TOOTH EXTRACTION      There were no vitals filed for this visit.   Subjective Assessment - 10/21/20 1106    Subjective Patient has been having increased heachaches over the past month. They can last all day but ussualy about 3 hours. She has been having improved pain with the needling.  The right side has been worse. When she is driving whe is having difficulty turning her head.    Pertinent History anxiety, bi-poalr, migranes, fibromyalgia,    Patient Stated Goals decrease tension & migraines    Currently in Pain? Yes    Pain Score 6     Pain Location Neck    Pain Orientation Right;Left    Pain Descriptors / Indicators Aching    Pain Type Chronic pain    Pain Onset More than a month ago    Pain Frequency Constant    Aggravating Factors  Stress    Pain Relieving Factors Dry needling; botox heat roller on the neck;              [facility] PT Assessment - 10/22/20 0001      Assessment   Medical Diagnosis cervicalgia, migraines     Referring Provider (PT) Shawnie Dapper, NP    Next MD Visit Three months    Prior Therapy Has had needling in the past      Precautions   Precautions None      Restrictions   Weight Bearing Restrictions No      AROM   Cervical Flexion 40    Cervical Extension 48   pinching on the left   Cervical - Right Side Bend 15   with pain   Cervical - Left Side Bend 20    Cervical - Right Rotation 50    Cervical - Left Rotation 40                                 PT Education - 10/21/20 1111    Education Details reviewed stretches and exercises    Person(s) Educated Patient    Methods Explanation;Demonstration;Tactile cues;Verbal cues    Comprehension Verbalized understanding;Returned demonstration;Verbal cues required;Tactile cues required            PT Short Term Goals - 10/22/20 0939      PT SHORT TERM GOAL #1  Title Cervical rotation within 5 deg Rt to Lt to decrease unequal strain    Baseline 10 degrees difference between left and right    Time 4    Period Weeks    Status On-going    Target Date 06/06/20      PT SHORT TERM GOAL #2   Title will verbalize performing HEP at least every other day    Baseline not perfroming as much as she should. We will continue to educate on the benfits of strengthening    Time 4    Period Weeks    Status On-going    Target Date 06/06/20      PT SHORT TERM GOAL #3   Title GHJ ER strength to 5/5    Baseline 4+/5 flexion bilateral and ER    Time 4    Period Weeks    Status On-going    Target Date 06/06/20             PT Long Term Goals - 10/22/20 0942      PT LONG TERM GOAL #1   Title pt will be able to drive comfortably without limitation by neck pain    Baseline continues to have discomfort    Time 6    Period Weeks    Status On-going      PT LONG TERM GOAL #2   Title HA/migraines to =<3 days/week    Baseline 2-3 x a week. had been improved but freqeuncy is increasing    Status On-going      PT  LONG TERM GOAL #3   Title Average pain with ADLs <=4/10    Baseline average 6-7/10 with spikes    Time 6    Period Weeks    Status On-going                 Plan - 10/21/20 1225    Clinical Impression Statement Patient presents back to therapy follwing a month of not having therapy. In that month she was sick and had some things going on at home. In that time her headaches ahvew steadily increased. She has used her HEP but not as much as she should.    Personal Factors and Comorbidities Comorbidity 1;Time since onset of injury/illness/exacerbation    Comorbidities fibromyalgia, high stress levels due to home environment    Examination-Activity Limitations Bathing;Reach Overhead;Sit;Caring for Others;Carry;Sleep;Lift    Examination-Participation Restrictions Cleaning;Meal Prep;Community Activity;Driving;Laundry    Stability/Clinical Decision Making Evolving/Moderate complexity    Clinical Decision Making Moderate    Rehab Potential Good    PT Frequency 1x / week    PT Duration 6 weeks    PT Treatment/Interventions ADLs/Self Care Home Management;Cryotherapy;Electrical Stimulation;Traction;Moist Heat;Iontophoresis 4mg /ml Dexamethasone;Therapeutic activities;Therapeutic exercise;Patient/family education;Neuromuscular re-education;Manual techniques;Taping;Dry needling;Joint Manipulations;Spinal Manipulations    PT Next Visit Plan DN PRN    PT Home Exercise Plan from last episode    Consulted and Agree with Plan of Care Patient           Patient will benefit from skilled therapeutic intervention in order to improve the following deficits and impairments:  Improper body mechanics,Pain,Postural dysfunction,Increased muscle spasms,Decreased activity tolerance,Decreased strength,Impaired UE functional use,Hypermobility  Visit Diagnosis: Migraine without aura and with status migrainosus, not intractable  Cervicalgia  Abnormal posture  Muscle weakness (generalized)     Problem  List Patient Active Problem List   Diagnosis Date Noted   UTI (urinary tract infection) 12/19/2019   Tinea manuum 05/21/2019   Abdominal wall lump 02/21/2018  Chronic fatigue syndrome with fibromyalgia 02/24/2017   Skin lesion of left leg 02/10/2017   History of abnormal cervical Pap smear 02/10/2017   Anxiety 10/15/2014   Depression 10/15/2014   Migraine 10/15/2014   Chronic pain 10/15/2014   Low TSH level 10/15/2014   Tobacco use disorder 10/15/2014    Dessie Coma 10/22/2020, 9:45 AM  [facility] [address] [place], [place], [address] Phone: [phone]   Fax:  [phone]  Name: Gail Blackburn MRN: [id number] Date of Birth: 22-Aug-1976

## 2020-10-27 DIAGNOSIS — F331 Major depressive disorder, recurrent, moderate: Secondary | ICD-10-CM | POA: Diagnosis not present

## 2020-10-28 ENCOUNTER — Ambulatory Visit (INDEPENDENT_AMBULATORY_CARE_PROVIDER_SITE_OTHER): Payer: Medicaid Other | Admitting: Family Medicine

## 2020-10-28 ENCOUNTER — Other Ambulatory Visit: Payer: Self-pay

## 2020-10-28 VITALS — BP 100/70 | HR 86 | Ht 65.5 in | Wt 132.0 lb

## 2020-10-28 DIAGNOSIS — R1010 Upper abdominal pain, unspecified: Secondary | ICD-10-CM

## 2020-10-28 MED ORDER — PANTOPRAZOLE SODIUM 40 MG PO TBEC
40.0000 mg | DELAYED_RELEASE_TABLET | Freq: Every day | ORAL | 0 refills | Status: DC
Start: 2020-10-28 — End: 2021-01-26

## 2020-10-28 NOTE — Assessment & Plan Note (Addendum)
Acute. Unclear etiology. No acute abdomen or infectious symptoms. Differential includes gastritis, PUD, pamcreatitis, gallbladder etiology, acute hepatitis. Tolerating PO. Hemodynamically stable. - CBC w/ diff, CMP, lipase - Protonix 40mg  QD - Recommended limiting alcohol intake. Recommended follow up with PCP to further discuss home stressors and excessive alcohol intake - Encouraged good hydration  - OTC Gaviscon, Pepto, Tums for break though pain - Ibuprofen 600mg  every 6 hours. Encouraged to avoid Tylenol until labs return and liver function evaluated. Will avoid opiates. - ED precautions discussed - Will order imaging pending lab results. if labs normal, will treat for gastritis/ulcer and start Sucralfate and refer to GI.

## 2020-10-28 NOTE — Patient Instructions (Signed)
It was a pleasure to see you today!  Thank you for choosing [facility] for your primary care.   Our plans for today were:  Start taking Pantoprazole (Protonix) 40mg  daily  Use Gaviscon, Pepto, Tums for break though pain  Use Ibuprofen 600mg  every 6 hours  Please limit alcohol use  Drink plenty of water  Go to the ED if you develop inability to stay hydrated, fever/chills, or severe abdominal pain not improved by above measures  Best Wishes,   , DO

## 2020-10-28 NOTE — Progress Notes (Signed)
   Subjective:   Patient ID: Gail Blackburn    DOB: [birth year], 45 y.o. female   MRN: [id number]  Gail Blackburn is a 45 y.o. female with a history of migraines, chronic fatigue w/ fibromyalgia, chronic pain, anxiety, depression, low TSH level, tobacco use disorder here for abdominal pain.  Abdominal Pain: Upper bilateral abdominal pain x 3 days. Describes the pain as sharp, worse when she uses her muscles, "Feels like someone is grabbing my stomach". Notes the pain is constant. Today is the worst day. She woke up 5 times overnight due to pain. Treatments included pepto, antacids, gasx. Denies any nausea, vomiting, diarrhea, constipation, fevers, chills. She takes Suboxone but has regular bowel movements. Last bowel movement was yesterday. It was firm. Notes that she is eating and drinking just fine. Alcohol: drinks "a lot" - drinks of vodka per day  Review of Systems:  Per HPI.   Objective:   BP 100/70   Pulse 86   Ht 5' 5.5" (1.664 m)   Wt 132 lb (59.9 kg)   LMP  (LMP Unknown)   SpO2 98%   BMI 21.63 kg/m  Vitals and nursing note reviewed.  General: pleasant thin young female, sitting comfortably in exam chair, well nourished, well developed, in no acute distress with non-toxic appearance Resp: breathing comfortably on room air, speaking in full sentences Abdomen: soft, tender primarily in RUQ and epigastric area, non-distended,palpable liver edge, normoactive bowel sounds, no rebound or guarding, (+) Murphey's sign Skin: warm, dry Extremities: warm and well perfused MSK:  gait normal Neuro: Alert and oriented, speech normal  Assessment & Plan:   Pain of upper abdomen Acute. Unclear etiology. No acute abdomen or infectious symptoms. Differential includes gastritis, PUD, pamcreatitis, gallbladder etiology, acute hepatitis. Tolerating PO. Hemodynamically stable. - CBC w/ diff, CMP, lipase - Protonix 40mg  QD - Recommended limiting alcohol intake. Recommended follow up with PCP to  further discuss home stressors and excessive alcohol intake - Encouraged good hydration  - OTC Gaviscon, Pepto, Tums for break though pain - Ibuprofen 600mg  every 6 hours. Encouraged to avoid Tylenol until labs return and liver function evaluated. Will avoid opiates. - ED precautions discussed - Will order imaging pending lab results. if labs normal, will treat for gastritis/ulcer and start Sucralfate and refer to GI.   Orders Placed This Encounter  Procedures  . CBC with Differential  . Lipase  . Comprehensive metabolic panel   Meds ordered this encounter  Medications  . pantoprazole (PROTONIX) 40 MG tablet    Sig: Take 1 tablet (40 mg total) by mouth daily.    Dispense:  30 tablet    Refill:  0    , DO PGY-3, [facility] 10/28/2020 8:18 PM

## 2020-10-29 LAB — CBC WITH DIFFERENTIAL/PLATELET
Basophils Absolute: 0.1 10*3/uL (ref 0.0–0.2)
Basos: 1 %
EOS (ABSOLUTE): 1.2 10*3/uL — ABNORMAL HIGH (ref 0.0–0.4)
Eos: 15 %
Hematocrit: 39 % (ref 34.0–46.6)
Hemoglobin: 12.7 g/dL (ref 11.1–15.9)
Immature Grans (Abs): 0 10*3/uL (ref 0.0–0.1)
Immature Granulocytes: 0 %
Lymphocytes Absolute: 2.4 10*3/uL (ref 0.7–3.1)
Lymphs: 30 %
MCH: 31.8 pg (ref 26.6–33.0)
MCHC: 32.6 g/dL (ref 31.5–35.7)
MCV: 98 fL — ABNORMAL HIGH (ref 79–97)
Monocytes Absolute: 0.6 10*3/uL (ref 0.1–0.9)
Monocytes: 7 %
Neutrophils Absolute: 3.7 10*3/uL (ref 1.4–7.0)
Neutrophils: 47 %
Platelets: 190 10*3/uL (ref 150–450)
RBC: 3.99 x10E6/uL (ref 3.77–5.28)
RDW: 12.9 % (ref 11.7–15.4)
WBC: 7.9 10*3/uL (ref 3.4–10.8)

## 2020-10-29 LAB — COMPREHENSIVE METABOLIC PANEL
ALT: 21 IU/L (ref 0–32)
AST: 35 IU/L (ref 0–40)
Albumin/Globulin Ratio: 2 (ref 1.2–2.2)
Albumin: 4.5 g/dL (ref 3.8–4.8)
Alkaline Phosphatase: 107 IU/L (ref 44–121)
BUN/Creatinine Ratio: 9 (ref 9–23)
BUN: 6 mg/dL (ref 6–24)
Bilirubin Total: 0.4 mg/dL (ref 0.0–1.2)
CO2: 23 mmol/L (ref 20–29)
Calcium: 9.6 mg/dL (ref 8.7–10.2)
Chloride: 104 mmol/L (ref 96–106)
Creatinine, Ser: 0.64 mg/dL (ref 0.57–1.00)
GFR calc Af Amer: 127 mL/min/{1.73_m2} (ref >59)
GFR calc non Af Amer: 110 mL/min/{1.73_m2} (ref >59)
Globulin, Total: 2.3 g/dL (ref 1.5–4.5)
Glucose: 87 mg/dL (ref 65–99)
Potassium: 4 mmol/L (ref 3.5–5.2)
Sodium: 140 mmol/L (ref 134–144)
Total Protein: 6.8 g/dL (ref 6.0–8.5)

## 2020-10-29 LAB — LIPASE: Lipase: 122 U/L — ABNORMAL HIGH (ref 14–72)

## 2020-10-30 ENCOUNTER — Telehealth: Payer: Self-pay

## 2020-10-30 NOTE — Telephone Encounter (Signed)
Patient calls nurse line requesting to speak to Dr. Mauri Reading regarding labs. Patient has been able to access lab results in [place], however, has further questions for provider regarding values.   Veronda Prude, RN

## 2020-10-30 NOTE — Telephone Encounter (Signed)
Spoke to patient and discussed lab results.

## 2020-11-02 ENCOUNTER — Inpatient Hospital Stay (HOSPITAL_COMMUNITY)
Admission: EM | Admit: 2020-11-02 | Discharge: 2020-11-10 | DRG: 439 | Disposition: A | Discharge status: Home / Self Care | Payer: Medicaid Other | Attending: Family Medicine | Admitting: Family Medicine

## 2020-11-02 ENCOUNTER — Other Ambulatory Visit: Payer: Self-pay

## 2020-11-02 ENCOUNTER — Encounter (HOSPITAL_COMMUNITY): Payer: Self-pay | Admitting: *Deleted

## 2020-11-02 DIAGNOSIS — Z91048 Other nonmedicinal substance allergy status: Secondary | ICD-10-CM

## 2020-11-02 DIAGNOSIS — Z91040 Latex allergy status: Secondary | ICD-10-CM

## 2020-11-02 DIAGNOSIS — E861 Hypovolemia: Secondary | ICD-10-CM | POA: Diagnosis present

## 2020-11-02 DIAGNOSIS — M797 Fibromyalgia: Secondary | ICD-10-CM | POA: Diagnosis present

## 2020-11-02 DIAGNOSIS — K5900 Constipation, unspecified: Secondary | ICD-10-CM | POA: Diagnosis present

## 2020-11-02 DIAGNOSIS — G43709 Chronic migraine without aura, not intractable, without status migrainosus: Secondary | ICD-10-CM

## 2020-11-02 DIAGNOSIS — G8929 Other chronic pain: Secondary | ICD-10-CM | POA: Diagnosis present

## 2020-11-02 DIAGNOSIS — R5382 Chronic fatigue, unspecified: Secondary | ICD-10-CM | POA: Diagnosis present

## 2020-11-02 DIAGNOSIS — Z818 Family history of other mental and behavioral disorders: Secondary | ICD-10-CM

## 2020-11-02 DIAGNOSIS — R188 Other ascites: Secondary | ICD-10-CM | POA: Diagnosis not present

## 2020-11-02 DIAGNOSIS — R109 Unspecified abdominal pain: Secondary | ICD-10-CM | POA: Diagnosis not present

## 2020-11-02 DIAGNOSIS — F10239 Alcohol dependence with withdrawal, unspecified: Secondary | ICD-10-CM | POA: Diagnosis not present

## 2020-11-02 DIAGNOSIS — I82890 Acute embolism and thrombosis of other specified veins: Secondary | ICD-10-CM | POA: Diagnosis present

## 2020-11-02 DIAGNOSIS — Z23 Encounter for immunization: Secondary | ICD-10-CM

## 2020-11-02 DIAGNOSIS — D509 Iron deficiency anemia, unspecified: Secondary | ICD-10-CM | POA: Diagnosis present

## 2020-11-02 DIAGNOSIS — K852 Alcohol induced acute pancreatitis without necrosis or infection: Principal | ICD-10-CM | POA: Diagnosis present

## 2020-11-02 DIAGNOSIS — Z79899 Other long term (current) drug therapy: Secondary | ICD-10-CM

## 2020-11-02 DIAGNOSIS — Z20822 Contact with and (suspected) exposure to covid-19: Secondary | ICD-10-CM | POA: Diagnosis present

## 2020-11-02 DIAGNOSIS — Z811 Family history of alcohol abuse and dependence: Secondary | ICD-10-CM

## 2020-11-02 DIAGNOSIS — F319 Bipolar disorder, unspecified: Secondary | ICD-10-CM | POA: Diagnosis present

## 2020-11-02 DIAGNOSIS — F17290 Nicotine dependence, other tobacco product, uncomplicated: Secondary | ICD-10-CM | POA: Diagnosis present

## 2020-11-02 DIAGNOSIS — F419 Anxiety disorder, unspecified: Secondary | ICD-10-CM | POA: Diagnosis present

## 2020-11-02 DIAGNOSIS — R627 Adult failure to thrive: Secondary | ICD-10-CM | POA: Diagnosis present

## 2020-11-02 DIAGNOSIS — J45909 Unspecified asthma, uncomplicated: Secondary | ICD-10-CM | POA: Diagnosis present

## 2020-11-02 DIAGNOSIS — E876 Hypokalemia: Secondary | ICD-10-CM | POA: Diagnosis not present

## 2020-11-02 DIAGNOSIS — E860 Dehydration: Secondary | ICD-10-CM | POA: Diagnosis present

## 2020-11-02 DIAGNOSIS — F1020 Alcohol dependence, uncomplicated: Secondary | ICD-10-CM

## 2020-11-02 DIAGNOSIS — G43909 Migraine, unspecified, not intractable, without status migrainosus: Secondary | ICD-10-CM | POA: Diagnosis present

## 2020-11-02 DIAGNOSIS — E871 Hypo-osmolality and hyponatremia: Secondary | ICD-10-CM | POA: Diagnosis present

## 2020-11-02 LAB — CBC
HCT: 40.8 % (ref 36.0–46.0)
Hemoglobin: 13.1 g/dL (ref 12.0–15.0)
MCH: 31.8 pg (ref 26.0–34.0)
MCHC: 32.1 g/dL (ref 30.0–36.0)
MCV: 99 fL (ref 80.0–100.0)
Platelets: 221 10*3/uL (ref 150–400)
RBC: 4.12 MIL/uL (ref 3.87–5.11)
RDW: 12.7 % (ref 11.5–15.5)
WBC: 9.7 10*3/uL (ref 4.0–10.5)
nRBC: 0 % (ref 0.0–0.2)

## 2020-11-02 LAB — URINALYSIS, ROUTINE W REFLEX MICROSCOPIC
Bilirubin Urine: NEGATIVE
Glucose, UA: NEGATIVE mg/dL
Ketones, ur: 5 mg/dL — AB
Leukocytes,Ua: NEGATIVE
Nitrite: NEGATIVE
Protein, ur: 100 mg/dL — AB
Specific Gravity, Urine: 1.04 — ABNORMAL HIGH (ref 1.005–1.030)
pH: 5 (ref 5.0–8.0)

## 2020-11-02 LAB — COMPREHENSIVE METABOLIC PANEL
ALT: 29 U/L (ref 0–44)
AST: 49 U/L — ABNORMAL HIGH (ref 15–41)
Albumin: 4 g/dL (ref 3.5–5.0)
Alkaline Phosphatase: 85 U/L (ref 38–126)
Anion gap: 11 (ref 5–15)
BUN: 11 mg/dL (ref 6–20)
CO2: 21 mmol/L — ABNORMAL LOW (ref 22–32)
Calcium: 9.1 mg/dL (ref 8.9–10.3)
Chloride: 101 mmol/L (ref 98–111)
Creatinine, Ser: 0.67 mg/dL (ref 0.44–1.00)
GFR, Estimated: >60 mL/min (ref >60)
Glucose, Bld: 99 mg/dL (ref 70–99)
Potassium: 4.6 mmol/L (ref 3.5–5.1)
Sodium: 133 mmol/L — ABNORMAL LOW (ref 135–145)
Total Bilirubin: 0.5 mg/dL (ref 0.3–1.2)
Total Protein: 6.6 g/dL (ref 6.5–8.1)

## 2020-11-02 LAB — I-STAT BETA HCG BLOOD, ED (MC, WL, AP ONLY): I-stat hCG, quantitative: <5 m[IU]/mL (ref <5)

## 2020-11-02 LAB — LIPASE, BLOOD: Lipase: 956 U/L — ABNORMAL HIGH (ref 11–51)

## 2020-11-02 MED ORDER — ONDANSETRON 4 MG PO TBDP
4.0000 mg | ORAL_TABLET | Freq: Once | ORAL | Status: AC | PRN
  Administered 2020-11-02: 4 mg via ORAL
  Filled 2020-11-02: qty 1

## 2020-11-02 NOTE — ED Notes (Signed)
Pt was eating and drinking at time of vitals recheck. Temperature would not take.

## 2020-11-02 NOTE — ED Triage Notes (Signed)
Pt reports having abd pain that started last week as upper abd pain. Went to pcp and had blood drawn, was told lipase was elevated. Now pain has increased and is generalized to entire abd. Had n/v today.

## 2020-11-03 ENCOUNTER — Emergency Department (HOSPITAL_COMMUNITY): Payer: Medicaid Other

## 2020-11-03 DIAGNOSIS — F10930 Alcohol use, unspecified with withdrawal, uncomplicated: Secondary | ICD-10-CM | POA: Insufficient documentation

## 2020-11-03 DIAGNOSIS — Z79891 Long term (current) use of opiate analgesic: Secondary | ICD-10-CM | POA: Diagnosis not present

## 2020-11-03 DIAGNOSIS — R109 Unspecified abdominal pain: Secondary | ICD-10-CM | POA: Insufficient documentation

## 2020-11-03 DIAGNOSIS — E871 Hypo-osmolality and hyponatremia: Secondary | ICD-10-CM | POA: Insufficient documentation

## 2020-11-03 DIAGNOSIS — K852 Alcohol induced acute pancreatitis without necrosis or infection: Secondary | ICD-10-CM | POA: Insufficient documentation

## 2020-11-03 DIAGNOSIS — F10230 Alcohol dependence with withdrawal, uncomplicated: Secondary | ICD-10-CM | POA: Insufficient documentation

## 2020-11-03 DIAGNOSIS — R188 Other ascites: Secondary | ICD-10-CM | POA: Diagnosis not present

## 2020-11-03 LAB — COMPREHENSIVE METABOLIC PANEL
ALT: 21 U/L (ref 0–44)
AST: 33 U/L (ref 15–41)
Albumin: 3.3 g/dL — ABNORMAL LOW (ref 3.5–5.0)
Alkaline Phosphatase: 86 U/L (ref 38–126)
Anion gap: 7 (ref 5–15)
BUN: 10 mg/dL (ref 6–20)
CO2: 24 mmol/L (ref 22–32)
Calcium: 8.1 mg/dL — ABNORMAL LOW (ref 8.9–10.3)
Chloride: 105 mmol/L (ref 98–111)
Creatinine, Ser: 0.65 mg/dL (ref 0.44–1.00)
GFR, Estimated: >60 mL/min (ref >60)
Glucose, Bld: 115 mg/dL — ABNORMAL HIGH (ref 70–99)
Potassium: 4 mmol/L (ref 3.5–5.1)
Sodium: 136 mmol/L (ref 135–145)
Total Bilirubin: 0.8 mg/dL (ref 0.3–1.2)
Total Protein: 5.7 g/dL — ABNORMAL LOW (ref 6.5–8.1)

## 2020-11-03 LAB — LIPID PANEL
Cholesterol: 133 mg/dL (ref 0–200)
HDL: 53 mg/dL (ref >40)
LDL Cholesterol: 63 mg/dL (ref 0–99)
Total CHOL/HDL Ratio: 2.5 RATIO
Triglycerides: 86 mg/dL (ref <150)
VLDL: 17 mg/dL (ref 0–40)

## 2020-11-03 LAB — BASIC METABOLIC PANEL
Anion gap: 7 (ref 5–15)
BUN: 9 mg/dL (ref 6–20)
CO2: 20 mmol/L — ABNORMAL LOW (ref 22–32)
Calcium: 7.7 mg/dL — ABNORMAL LOW (ref 8.9–10.3)
Chloride: 109 mmol/L (ref 98–111)
Creatinine, Ser: 0.56 mg/dL (ref 0.44–1.00)
GFR, Estimated: >60 mL/min (ref >60)
Glucose, Bld: 81 mg/dL (ref 70–99)
Potassium: 3.5 mmol/L (ref 3.5–5.1)
Sodium: 136 mmol/L (ref 135–145)

## 2020-11-03 LAB — HIV ANTIBODY (ROUTINE TESTING W REFLEX): HIV Screen 4th Generation wRfx: NONREACTIVE

## 2020-11-03 LAB — PHOSPHORUS: Phosphorus: 2.3 mg/dL — ABNORMAL LOW (ref 2.5–4.6)

## 2020-11-03 LAB — SARS CORONAVIRUS 2 (TAT 6-24 HRS): SARS Coronavirus 2: NEGATIVE

## 2020-11-03 LAB — MAGNESIUM: Magnesium: 1.9 mg/dL (ref 1.7–2.4)

## 2020-11-03 MED ORDER — GABAPENTIN 800 MG PO TABS
800.0000 mg | ORAL_TABLET | Freq: Three times a day (TID) | ORAL | Status: DC
  Filled 2020-11-03 (×2): qty 1

## 2020-11-03 MED ORDER — THIAMINE HCL 100 MG PO TABS
100.0000 mg | ORAL_TABLET | Freq: Every day | ORAL | Status: DC
  Administered 2020-11-03 – 2020-11-10 (×7): 100 mg via ORAL
  Filled 2020-11-03 (×7): qty 1

## 2020-11-03 MED ORDER — SODIUM CHLORIDE 0.9 % IV SOLN: INTRAVENOUS | Status: DC

## 2020-11-03 MED ORDER — KETOROLAC TROMETHAMINE 30 MG/ML IJ SOLN
30.0000 mg | Freq: Once | INTRAMUSCULAR | Status: AC
  Administered 2020-11-03: 30 mg via INTRAVENOUS
  Filled 2020-11-03: qty 1

## 2020-11-03 MED ORDER — GABAPENTIN 300 MG PO CAPS
800.0000 mg | ORAL_CAPSULE | Freq: Three times a day (TID) | ORAL | Status: DC
  Administered 2020-11-03: 800 mg via ORAL
  Filled 2020-11-03: qty 2

## 2020-11-03 MED ORDER — ENOXAPARIN SODIUM 40 MG/0.4ML ~~LOC~~ SOLN
40.0000 mg | SUBCUTANEOUS | Status: DC
  Administered 2020-11-03 – 2020-11-10 (×8): 40 mg via SUBCUTANEOUS
  Filled 2020-11-03 (×8): qty 0.4

## 2020-11-03 MED ORDER — LORAZEPAM 1 MG PO TABS
1.0000 mg | ORAL_TABLET | ORAL | Status: AC | PRN
  Administered 2020-11-03 – 2020-11-04 (×5): 1 mg via ORAL
  Administered 2020-11-04: 3 mg via ORAL
  Administered 2020-11-05 (×3): 1 mg via ORAL
  Administered 2020-11-06: 2 mg via ORAL
  Filled 2020-11-03 (×2): qty 2
  Filled 2020-11-03 (×4): qty 1
  Filled 2020-11-03: qty 3
  Filled 2020-11-03 (×3): qty 1

## 2020-11-03 MED ORDER — ONDANSETRON HCL 4 MG PO TABS
4.0000 mg | ORAL_TABLET | Freq: Four times a day (QID) | ORAL | Status: DC | PRN
  Administered 2020-11-04 – 2020-11-05 (×3): 4 mg via ORAL
  Filled 2020-11-03 (×3): qty 1

## 2020-11-03 MED ORDER — THIAMINE HCL 100 MG/ML IJ SOLN
100.0000 mg | Freq: Every day | INTRAMUSCULAR | Status: DC
  Administered 2020-11-07: 100 mg via INTRAVENOUS
  Filled 2020-11-03 (×3): qty 2

## 2020-11-03 MED ORDER — BUPROPION HCL ER (XL) 150 MG PO TB24
150.0000 mg | ORAL_TABLET | Freq: Every day | ORAL | Status: DC
Start: 2020-11-03 — End: 2020-11-03

## 2020-11-03 MED ORDER — SODIUM CHLORIDE 0.9 % IV BOLUS
1000.0000 mL | Freq: Once | INTRAVENOUS | Status: AC
  Administered 2020-11-03: 1000 mL via INTRAVENOUS

## 2020-11-03 MED ORDER — ADULT MULTIVITAMIN W/MINERALS CH
1.0000 | ORAL_TABLET | Freq: Every day | ORAL | Status: DC
  Administered 2020-11-03 – 2020-11-10 (×8): 1 via ORAL
  Filled 2020-11-03 (×8): qty 1

## 2020-11-03 MED ORDER — ONDANSETRON HCL 4 MG/2ML IJ SOLN
INTRAMUSCULAR | Status: AC
  Filled 2020-11-03: qty 2

## 2020-11-03 MED ORDER — GABAPENTIN 400 MG PO CAPS
1200.0000 mg | ORAL_CAPSULE | Freq: Every day | ORAL | Status: DC
Start: 2020-11-04 — End: 2020-11-11
  Administered 2020-11-04 – 2020-11-10 (×7): 1200 mg via ORAL
  Filled 2020-11-03 (×7): qty 3

## 2020-11-03 MED ORDER — BUPROPION HCL ER (SR) 150 MG PO TB12
150.0000 mg | ORAL_TABLET | Freq: Every day | ORAL | Status: DC
  Administered 2020-11-04 – 2020-11-10 (×7): 150 mg via ORAL
  Filled 2020-11-03 (×8): qty 1

## 2020-11-03 MED ORDER — FOLIC ACID 1 MG PO TABS
1.0000 mg | ORAL_TABLET | Freq: Every day | ORAL | Status: DC
  Administered 2020-11-03 – 2020-11-10 (×8): 1 mg via ORAL
  Filled 2020-11-03 (×8): qty 1

## 2020-11-03 MED ORDER — ONDANSETRON HCL 4 MG/2ML IJ SOLN
4.0000 mg | Freq: Four times a day (QID) | INTRAMUSCULAR | Status: DC | PRN
  Administered 2020-11-04: 4 mg via INTRAVENOUS
  Filled 2020-11-03: qty 2

## 2020-11-03 MED ORDER — BUPRENORPHINE HCL-NALOXONE HCL 8-2 MG SL SUBL
1.0000 | SUBLINGUAL_TABLET | Freq: Two times a day (BID) | SUBLINGUAL | Status: DC
  Administered 2020-11-03 – 2020-11-10 (×14): 1 via SUBLINGUAL
  Filled 2020-11-03 (×14): qty 1

## 2020-11-03 MED ORDER — BUPROPION HCL ER (XL) 150 MG PO TB24
150.0000 mg | ORAL_TABLET | Freq: Every day | ORAL | Status: DC
  Administered 2020-11-03: 150 mg via ORAL
  Filled 2020-11-03: qty 1

## 2020-11-03 MED ORDER — GABAPENTIN 400 MG PO CAPS
800.0000 mg | ORAL_CAPSULE | Freq: Two times a day (BID) | ORAL | Status: DC
  Administered 2020-11-03 – 2020-11-10 (×14): 800 mg via ORAL
  Filled 2020-11-03 (×14): qty 2

## 2020-11-03 MED ORDER — KETOROLAC TROMETHAMINE 30 MG/ML IJ SOLN: 30.0000 mg | Freq: Four times a day (QID) | INTRAMUSCULAR | Status: DC | PRN

## 2020-11-03 MED ORDER — GABAPENTIN 300 MG PO CAPS
400.0000 mg | ORAL_CAPSULE | Freq: Once | ORAL | Status: AC
  Administered 2020-11-03: 400 mg via ORAL
  Filled 2020-11-03: qty 1

## 2020-11-03 MED ORDER — PROPRANOLOL HCL ER 60 MG PO CP24
60.0000 mg | ORAL_CAPSULE | Freq: Every day | ORAL | Status: DC
  Administered 2020-11-03 – 2020-11-10 (×8): 60 mg via ORAL
  Filled 2020-11-03 (×8): qty 1

## 2020-11-03 MED ORDER — BISACODYL 10 MG RE SUPP: 10.0000 mg | Freq: Once | RECTAL | Status: DC

## 2020-11-03 MED ORDER — LORAZEPAM 2 MG/ML IJ SOLN
1.0000 mg | INTRAMUSCULAR | Status: AC | PRN
Start: 2020-11-03 — End: 2020-11-06
  Administered 2020-11-05: 2 mg via INTRAVENOUS
  Filled 2020-11-03: qty 1

## 2020-11-03 MED ORDER — BISACODYL 10 MG RE SUPP
10.0000 mg | Freq: Every day | RECTAL | Status: DC | PRN
  Administered 2020-11-04: 10 mg via RECTAL
  Filled 2020-11-03: qty 1

## 2020-11-03 MED ORDER — NICOTINE 21 MG/24HR TD PT24
21.0000 mg | MEDICATED_PATCH | Freq: Every day | TRANSDERMAL | Status: DC
  Administered 2020-11-04 – 2020-11-10 (×7): 21 mg via TRANSDERMAL
  Filled 2020-11-03 (×8): qty 1

## 2020-11-03 MED ORDER — CYCLOBENZAPRINE HCL 10 MG PO TABS
5.0000 mg | ORAL_TABLET | Freq: Three times a day (TID) | ORAL | Status: DC | PRN
  Administered 2020-11-04 – 2020-11-10 (×8): 5 mg via ORAL
  Filled 2020-11-03 (×9): qty 1

## 2020-11-03 MED ORDER — KETOROLAC TROMETHAMINE 30 MG/ML IJ SOLN
30.0000 mg | Freq: Four times a day (QID) | INTRAMUSCULAR | Status: DC
  Administered 2020-11-03 – 2020-11-06 (×12): 30 mg via INTRAVENOUS
  Filled 2020-11-03 (×12): qty 1

## 2020-11-03 NOTE — ED Notes (Signed)
Patient transported to Ultrasound 

## 2020-11-03 NOTE — H&P (Addendum)
Family Medicine Teaching [facility] Admission History and Physical Service Pager: [phone]  Patient name: Gail Blackburn Medical record number: [id number] Date of birth: [birth year] Age: 45 y.o. Gender: female  Primary Care Provider: Leeroy Bock, DO Consultants: None Code Status: Full  Chief Complaint: Generalized Abdominal pain  Assessment and Plan: Gail Blackburn is a 45 y.o. female presenting with worsening abdominal pain and intolerance to PO. PMH is significant for migraines, chronic pain, anxiety/depression, tobacco use disorder  Moderate Acute Alcoholic Pancreatitis Acute. Progressively worsening abdominal pain, initially epigastric, now generalized. Evaluated at [facility] on 10/28/20 for upper abdominal pain with mild elevation in lipase in setting of heavy alcohol use - recommended home management. Presented to ED on 2/7 with worsening pain and now nausea/vomiting with inability to tolerate PO in setting of continued alcohol use. Lipase acutely worsened from 122 to 956. RUQ ultrasound negative for biliary stones, notable for acute pancreatitis.  UA with elevated SG indicative of element of dehydration, otherwise normal. CBC WNL. Sodium mildly low at 133 otherwise electrolytes normal with stable kidney function. Bili and liver function relatively normal. Upreg negative. Hemodynamically stable. Given inability to tolerate PO, will admit for fluid hydration and pain control.  - Admit to FPTS, med surg, attending Dr. Jennette Kettle - NPO, advance diet as tolerated  - S/p 1L NS bolus. Dry mucus membranes - give another 1L bolus - 145ml/hr NS IVF  - Toradol 30mg  IV 6 hours x 5 days total - Continue Suboxone 8-2mg  SL, next dose PM on 2/8 - Zofran PRN - AVOID NARCOTICS  - Consider 1000mg  IV Tylenol PRN if break through pain  - repeat CMP, lipid panel, A1C, HIV now - monitor electrolytes and vitals closely over next 24-48 hours > Repeat BMP at 2100, vitals q6 hours - strict I/O with goal of >0.5 to  1 cc/kg/hour  Hyponatremia: Most recent Na 133. Likely secondary to hypovolemia in setting of vomiting. Dry mucus membranes on exam. Plan to repeat now that she has been repleated. If continues to remain low, will further evaluate. - follow up repeat CMP  Alcohol Abuse: Currently drinks 4/8 of vodka per day, last drink yesterday. No prior history of hospitalizations for withdrawals or seizures. Endorses taking more home Klonopin to help with withdrawals.  - CIWAs with ativan  - TOC for alcohol cessation resources  Anxiety/Depression: Chronic. Denis acute SI/HI. Home meds include Wellbutrin 150mg  QD, Klonopin 0.5mg  PRN, Hydroxyzine 50mg  PRN for anxiety (endorsres seldomly taking). - Ativan PRN for CIWAs, once out of acute withdrawal, can consider restarting PRN home Klonopin - Continue Wellbutrin 150mg  QD - Consider restarting home Hydroxyzine if additional agent needed for anxiety or sleep  Migraines:  Chronic. Home meds include: Galacanezumab monthly injection, Propranolol 60mg  QD, Rizatriptan 10mg  PRN. - continue Propranolol 60mg  QD - can add PRN triptan if needed  Chronic Pain: Chronic. Currently on Suboxone 8-2mg  SL BID, Gabapentin 800mg  TID and Flexeril 5mg  PRN. - continue home meds  Tobacco Use Disorder: - Nicotine patch PRN  FEN/GI: NPO, likely advance diet in 24 hours if improving Prophylaxis: Lovenox  Disposition: med-surg  History of Present Illness:  Gail Blackburn is a 45 y.o. female presenting with worsening abdominal pain. She was seen at [facility] on 10/28/20 for upper abdominal pain. Denied any nausea, vomiting at that time. CMP, CBC, UA normal. Lipase mildly elevated at that time to 122. She was instructed to avoid alcohol, manage pain with home suboxone and Tylenol/Ibuprofen, and bland diet with plenty of hydration. She notes  that she continued to drink alcohol and developed worsening pain in subsequent days. Developed nausea/vomiting yesterday with inability to tolerate  any PO. Denies any fevers or chills. Last bowel movement was Saturday and has been urinating normally.   Review Of Systems: Per HPI with the following additions:   Review of Systems  Constitutional: Negative for chills and fever.  HENT: Negative.   Respiratory: Negative.   Cardiovascular: Negative.   Gastrointestinal: Positive for abdominal pain, nausea and vomiting. Negative for constipation and diarrhea.  Genitourinary: Negative for dysuria.  Neurological: Negative for dizziness, seizures, loss of consciousness and headaches.  Psychiatric/Behavioral: Positive for substance abuse. Negative for suicidal ideas.    Patient Active Problem List   Diagnosis Date Noted  . Pain of upper abdomen 10/28/2020  . UTI (urinary tract infection) 12/19/2019  . Tinea manuum 05/21/2019  . Abdominal wall lump 02/21/2018  . Chronic fatigue syndrome with fibromyalgia 02/24/2017  . Skin lesion of left leg 02/10/2017  . History of abnormal cervical Pap smear 02/10/2017  . Anxiety 10/15/2014  . Depression 10/15/2014  . Migraine 10/15/2014  . Chronic pain 10/15/2014  . Low TSH level 10/15/2014  . Tobacco use disorder 10/15/2014    Past Medical History: Past Medical History:  Diagnosis Date  . Anemia    PRIOR HISTORY  . Anxiety   . Asthma   . Bipolar disorder (HCC)    Borderline  . Depression   . Fibromyalgia   . Headache    MIGRAINES  . Hypothyroidism 2009   TOOK MEDS FOR FEW WEEKS NO MEDS NOW  . Migraine   . Thyroid disease    treated in past not sure if too high or too low    Past Surgical History: Past Surgical History:  Procedure Laterality Date  . COLONOSCOPY    . DILATION AND EVACUATION N/A 05/06/2016   Procedure: DILATATION AND EVACUATION;  Surgeon: Huel Cote, MD;  Location: [facility];  Service: Gynecology;  Laterality: N/A;  . FOOT SURGERY    . TEETH PULLED  03/2016  . WISDOM TOOTH EXTRACTION      Social History: Social History   Tobacco Use  . Smoking status:  Current Every Day Smoker    Years: 15.00    Types: Cigarettes  . Smokeless tobacco: Never Used  . Tobacco comment: Up to 5 vape cigs per day  Vaping Use  . Vaping Use: Every day  Substance Use Topics  . Alcohol use: No    Comment: Occasional, maybe 2 x/yr  . Drug use: No    Comment: Previous addiction to pain meds   Additional social history: Alcohol use daily  Please also refer to relevant sections of EMR.  Family History: Family History  Problem Relation Age of Onset  . Bipolar disorder Mother   . Alcohol abuse Mother   . Hyperlipidemia Mother   . Heart disease Maternal Grandmother   . Stroke Maternal Grandmother   . Leukemia Maternal Grandmother   . Hyperlipidemia Maternal Grandmother   . Hypertension Maternal Grandmother   . Graves' disease Cousin   . Alcohol abuse Maternal Aunt   . Drug abuse Maternal Aunt   . Asthma Maternal Aunt   . COPD Maternal Aunt   . Bipolar disorder Maternal Aunt   . Hyperlipidemia Maternal Aunt    Allergies and Medications: Allergies  Allergen Reactions  . Latex Rash    Only in vaginal area   No current facility-administered medications on file prior to encounter.   Current Outpatient  Medications on File Prior to Encounter  Medication Sig Dispense Refill  . botulinum toxin Type A (BOTOX) 100 units SOLR injection PROVIDER TO INJECT 155 UNITS INTRAMUSCULARLY INTO HEAD AND NECK EVERY 3 MONTHS. 2 each 1  . buprenorphine-naloxone (SUBOXONE) 8-2 MG SUBL SL tablet Place 1 tablet under the tongue 2 (two) times daily.    Marland Kitchen buPROPion (WELLBUTRIN XL) 150 MG 24 hr tablet Take 150 mg by mouth daily.    . cyclobenzaprine (FLEXERIL) 5 MG tablet Take 1 tablet (5 mg total) by mouth every 8 (eight) hours as needed for muscle spasms. 30 tablet 5  . diclofenac sodium (VOLTAREN) 1 % GEL Apply 4 g topically 4 (four) times daily. 100 g 4  . gabapentin (NEURONTIN) 800 MG tablet Take 800 mg by mouth 3 (three) times daily.  0  . Galcanezumab-gnlm (EMGALITY) 120  MG/ML SOAJ Inject 120 mg into the skin every 30 (thirty) days. 1 mL 11  . methylPREDNISolone (MEDROL DOSEPAK) 4 MG TBPK tablet FOLLOW PACKAGE DIRECTIONS 21 tablet 0  . norethindrone (JENCYCLA) 0.35 MG tablet Take 1 tablet by mouth daily.    . ondansetron (ZOFRAN) 4 MG tablet TAKE 1 TABLET BY MOUTH EVERY 6 HOURS AS NEEDED FOR NAUSEA FOR VOMITING 20 tablet 5  . pantoprazole (PROTONIX) 40 MG tablet Take 1 tablet (40 mg total) by mouth daily. 30 tablet 0  . propranolol ER (INDERAL LA) 60 MG 24 hr capsule Take 1 capsule (60 mg total) by mouth daily. 90 capsule 3  . rizatriptan (MAXALT-MLT) 10 MG disintegrating tablet 1 TABLET FOR MIGRAINE. MAY REPEAT IN 2 HOURS. 10 tablet 11  . terbinafine (LAMISIL) 1 % cream Apply 1 application topically 2 (two) times daily. 12 g 2  . venlafaxine XR (EFFEXOR-XR) 150 MG 24 hr capsule Take 150 mg by mouth daily with breakfast.      Objective: BP (!) 134/107   Pulse 93   Temp 98 F (36.7 C) (Oral)   Resp (!) 27   LMP 10/05/2020   SpO2 99%  Exam: General: pleasant young thin female, appears comfortable when lying still but appears uncomfortable when having to move, well nourished, well developed, in no acute distress with non-toxic appearance HEENT: normocephalic, atraumatic, dry mucous membranes, oropharynx clear without erythema or exudate CV: regular rate and rhythm without murmurs, rubs, or gallops, no lower extremity edema, 2+ radial and pedal pulses bilaterally Lungs: clear to auscultation bilaterally with normal work of breathing on room air, speaking in full sentences Abdomen: soft but diffusely tender throughout, non-distended, no masses or organomegaly palpable, normoactive bowel sounds Skin: warm, dry Extremities: warm and well perfused Neuro: Alert and oriented, speech normal   Labs and Imaging: CBC BMET  Recent Labs  Lab 11/02/20 1645  WBC 9.7  HGB 13.1  HCT 40.8  PLT 221   Recent Labs  Lab 11/02/20 1645  NA 133*  K 4.6  CL 101  CO2  21*  BUN 11  CREATININE 0.67  GLUCOSE 99  CALCIUM 9.1      Lipase 956 Beta-HCG: neg  Urinalysis    Component Value Date/Time   COLORURINE YELLOW 11/02/2020 1645   APPEARANCEUR HAZY (A) 11/02/2020 1645   LABSPEC 1.040 (H) 11/02/2020 1645   PHURINE 5.0 11/02/2020 1645   GLUCOSEU NEGATIVE 11/02/2020 1645   HGBUR SMALL (A) 11/02/2020 1645   BILIRUBINUR NEGATIVE 11/02/2020 1645   KETONESUR 5 (A) 11/02/2020 1645   PROTEINUR 100 (A) 11/02/2020 1645   UROBILINOGEN 1.0 04/10/2014 1816   NITRITE  NEGATIVE 11/02/2020 1645   LEUKOCYTESUR NEGATIVE 11/02/2020 1645    US Abdomen Limited RUQ (LIVER/GB)  Result Date: 11/03/2020 CLINICAL DATA:  Abdominal pain EXAM: ULTRASOUND ABDOMEN LIMITED RIGHT UPPER QUADRANT COMPARISON:  January 03, 2009 abdominal ultrasound. FINDINGS: Gallbladder: No gallstones or wall thickening visualized. There is no pericholecystic fluid. No sonographic Murphy sign noted by sonographer. Common bile duct: Diameter: 4 mm. No intrahepatic or extrahepatic biliary duct dilatation. Liver: No focal lesion identified. Within normal limits in parenchymal echogenicity. Portal vein is patent on color Doppler imaging with normal direction of blood flow towards the liver. Other: Visualized portions of pancreas appear rather prominent with pancreatic duct measuring 4 mm. Slight ascites. IMPRESSION: 1. Prominence in visualized portions of pancreas with prominence of the pancreatic duct. These are findings potentially indicating a degree of acute pancreatitis. It may be prudent to consider correlation with contrast enhanced CT or MR of the pancreas to further evaluate. 2.  Slight ascites. 3.  Study otherwise unremarkable. Electronically Signed   By: Bretta Bang III M.D.   On: 11/03/2020 08:39    Joana Reamer, DO 11/03/2020, 9:36 AM PGY-3, [place] FPTS Intern pager: [phone], text pages welcome

## 2020-11-03 NOTE — ED Provider Notes (Signed)
[facility] EMERGENCY DEPARTMENT Provider Note   CSN: [id number] Arrival date & time: 11/02/20  1559     History Chief Complaint  Patient presents with  . Abdominal Pain    Gail Blackburn is a 45 y.o. female has medical history of bipolar disorder, depression, fibromyalgia, hypothyroidism, Suboxone use who presents for evaluation of abdominal pain, nausea/vomiting.  Patient reports she has had intermittent abdominal pain over the last month or so.  But a week ago, she went to the [facility] family clinic and was evaluated.  She was told her lipase was in the 100s.  She states that she was monitoring her symptoms.  About 2 days ago, she felt like the pain got more severe.  She states it had typically been in the upper part of the abdomen but it is since spread diffusely throughout the entire abdomen.  States pain is worse in the right upper quadrant.  She states that since yesterday, she has not been able to tolerate any p.o. and has started vomiting.  She reports last night, the vomiting started turning green in color.  No blood.  She reports that her last Suboxone dose was yesterday.  She does drink alcohol and states over the last year, she has increased her alcohol usage.  She reports that in the day, she will drink about half 1/5 of vodka.  She estimates that she does this a few times a week.  She denies any diarrhea, constipation.  She has not had any previous abdominal surgeries.  Denies any fevers, chest pain, difficulty breathing, dysuria, hematuria.  The history is provided by the patient.       Past Medical History:  Diagnosis Date  . Anemia    PRIOR HISTORY  . Anxiety   . Asthma   . Bipolar disorder ([place])    Borderline  . Depression   . Fibromyalgia   . Headache    MIGRAINES  . Hypothyroidism 2009   TOOK MEDS FOR FEW WEEKS NO MEDS NOW  . Migraine   . Thyroid disease    treated in past not sure if too high or too low    Patient Active Problem List    Diagnosis Date Noted  . Pain of upper abdomen 10/28/2020  . UTI (urinary tract infection) 12/19/2019  . Tinea manuum 05/21/2019  . Abdominal wall lump 02/21/2018  . Chronic fatigue syndrome with fibromyalgia 02/24/2017  . Skin lesion of left leg 02/10/2017  . History of abnormal cervical Pap smear 02/10/2017  . Anxiety 10/15/2014  . Depression 10/15/2014  . Migraine 10/15/2014  . Chronic pain 10/15/2014  . Low TSH level 10/15/2014  . Tobacco use disorder 10/15/2014    Past Surgical History:  Procedure Laterality Date  . COLONOSCOPY    . DILATION AND EVACUATION N/A 05/06/2016   Procedure: DILATATION AND EVACUATION;  Surgeon: Paula Compton, MD;  Location: [facility];  Service: Gynecology;  Laterality: N/A;  . FOOT SURGERY    . TEETH PULLED  03/2016  . WISDOM TOOTH EXTRACTION       OB History   No obstetric history on file.     Family History  Problem Relation Age of Onset  . Bipolar disorder Mother   . Alcohol abuse Mother   . Hyperlipidemia Mother   . Heart disease Maternal Grandmother   . Stroke Maternal Grandmother   . Leukemia Maternal Grandmother   . Hyperlipidemia Maternal Grandmother   . Hypertension Maternal Grandmother   . Graves' disease Cousin   .  Alcohol abuse Maternal Aunt   . Drug abuse Maternal Aunt   . Asthma Maternal Aunt   . COPD Maternal Aunt   . Bipolar disorder Maternal Aunt   . Hyperlipidemia Maternal Aunt     Social History   Tobacco Use  . Smoking status: Current Every Day Smoker    Years: 15.00    Types: Cigarettes  . Smokeless tobacco: Never Used  . Tobacco comment: Up to 5 vape cigs per day  Vaping Use  . Vaping Use: Every day  Substance Use Topics  . Alcohol use: No    Comment: Occasional, maybe 2 x/yr  . Drug use: No    Comment: Previous addiction to pain meds    Home Medications Prior to Admission medications   Medication Sig Start Date End Date Taking? Authorizing Provider  botulinum toxin Type A (BOTOX) 100 units SOLR  injection PROVIDER TO INJECT 155 UNITS INTRAMUSCULARLY INTO HEAD AND NECK EVERY 3 MONTHS. 12/18/19   Melvenia Beam, MD  buprenorphine-naloxone (SUBOXONE) 8-2 MG SUBL SL tablet Place 1 tablet under the tongue 2 (two) times daily.    [provider]  buPROPion (WELLBUTRIN XL) 150 MG 24 hr tablet Take 150 mg by mouth daily.    [provider]  cyclobenzaprine (FLEXERIL) 5 MG tablet Take 1 tablet (5 mg total) by mouth every 8 (eight) hours as needed for muscle spasms. 04/27/20   Lomax, Amy, NP  diclofenac sodium (VOLTAREN) 1 % GEL Apply 4 g topically 4 (four) times daily. 06/21/18   Melvenia Beam, MD  gabapentin (NEURONTIN) 800 MG tablet Take 800 mg by mouth 3 (three) times daily. 12/09/16   [provider]  Galcanezumab-gnlm (EMGALITY) 120 MG/ML SOAJ Inject 120 mg into the skin every 30 (thirty) days. 04/16/20   Lomax, Amy, NP  methylPREDNISolone (MEDROL DOSEPAK) 4 MG TBPK tablet FOLLOW PACKAGE DIRECTIONS 08/02/20   Melvenia Beam, MD  norethindrone (JENCYCLA) 0.35 MG tablet Take 1 tablet by mouth daily.    [provider]  ondansetron (ZOFRAN) 4 MG tablet TAKE 1 TABLET BY MOUTH EVERY 6 HOURS AS NEEDED FOR NAUSEA FOR VOMITING 10/21/20   Lomax, Amy, NP  pantoprazole (PROTONIX) 40 MG tablet Take 1 tablet (40 mg total) by mouth daily. 10/28/20   Mullis, Kiersten P, DO  propranolol ER (INDERAL LA) 60 MG 24 hr capsule Take 1 capsule (60 mg total) by mouth daily. 04/16/20   Lomax, Amy, NP  rizatriptan (MAXALT-MLT) 10 MG disintegrating tablet 1 TABLET FOR MIGRAINE. MAY REPEAT IN 2 HOURS. 04/16/20   Lomax, Amy, NP  terbinafine (LAMISIL) 1 % cream Apply 1 application topically 2 (two) times daily. 05/21/19   Sherene Sires, DO  venlafaxine XR (EFFEXOR-XR) 150 MG 24 hr capsule Take 150 mg by mouth daily with breakfast.    [provider]    Allergies    Latex  Review of Systems   Review of Systems  Constitutional: Negative for fever.  Respiratory: Negative for cough  and shortness of breath.   Cardiovascular: Negative for chest pain.  Gastrointestinal: Positive for abdominal pain, nausea and vomiting. Negative for constipation and diarrhea.  Genitourinary: Negative for dysuria and hematuria.  Neurological: Negative for headaches.  All other systems reviewed and are negative.   Physical Exam Updated Vital Signs BP (!) 129/96   Pulse 96   Temp 98 F (36.7 C) (Oral)   Resp 16   LMP 10/05/2020   SpO2 100%   Physical Exam Vitals and nursing note  reviewed.  Constitutional:      Appearance: Normal appearance. She is well-developed and well-nourished.     Comments: Appears uncomfortable, actively vomiting  HENT:     Head: Normocephalic and atraumatic.     Mouth/Throat:     Mouth: Oropharynx is clear and moist and mucous membranes are normal.  Eyes:     General: Lids are normal.     Extraocular Movements: EOM normal.     Conjunctiva/sclera: Conjunctivae normal.     Pupils: Pupils are equal, round, and reactive to light.  Cardiovascular:     Rate and Rhythm: Normal rate and regular rhythm.     Pulses: Normal pulses.     Heart sounds: Normal heart sounds. No murmur heard. No friction rub. No gallop.   Pulmonary:     Effort: Pulmonary effort is normal.     Breath sounds: Normal breath sounds.     Comments: Lungs clear to auscultation bilaterally.  Symmetric chest rise.  No wheezing, rales, rhonchi. Abdominal:     Palpations: Abdomen is soft. Abdomen is not rigid.     Tenderness: There is abdominal tenderness. There is no guarding.     Comments: Abdomen soft, nondistended.  Generalized tenderness noted with focal tenderness in the right upper quadrant, epigastric and left upper quadrant region.  No rigidity, guarding.  Musculoskeletal:        General: Normal range of motion.     Cervical back: Full passive range of motion without pain.  Skin:    General: Skin is warm and dry.     Capillary Refill: Capillary refill takes less than 2 seconds.   Neurological:     Mental Status: She is alert and oriented to person, place, and time.  Psychiatric:        Mood and Affect: Mood and affect normal.        Speech: Speech normal.     ED Results / Procedures / Treatments   Labs (all labs ordered are listed, but only abnormal results are displayed) Labs Reviewed  LIPASE, BLOOD - Abnormal; Notable for the following components:      Result Value   Lipase 956 (*)    All other components within normal limits  COMPREHENSIVE METABOLIC PANEL - Abnormal; Notable for the following components:   Sodium 133 (*)    CO2 21 (*)    AST 49 (*)    All other components within normal limits  URINALYSIS, ROUTINE W REFLEX MICROSCOPIC - Abnormal; Notable for the following components:   APPearance HAZY (*)    Specific Gravity, Urine 1.040 (*)    Hgb urine dipstick SMALL (*)    Ketones, ur 5 (*)    Protein, ur 100 (*)    Bacteria, UA RARE (*)    All other components within normal limits  SARS CORONAVIRUS 2 (TAT 6-24 HRS)  CBC  I-STAT BETA HCG BLOOD, ED ([facility], [facility], [facility] ONLY)    EKG None  Radiology US Abdomen Limited RUQ (LIVER/GB)  Result Date: 11/03/2020 CLINICAL DATA:  Abdominal pain EXAM: ULTRASOUND ABDOMEN LIMITED RIGHT UPPER QUADRANT COMPARISON:  January 03, 2009 abdominal ultrasound. FINDINGS: Gallbladder: No gallstones or wall thickening visualized. There is no pericholecystic fluid. No sonographic Murphy sign noted by sonographer. Common bile duct: Diameter: 4 mm. No intrahepatic or extrahepatic biliary duct dilatation. Liver: No focal lesion identified. Within normal limits in parenchymal echogenicity. Portal vein is patent on color Doppler imaging with normal direction of blood flow towards the liver. Other: Visualized portions of  pancreas appear rather prominent with pancreatic duct measuring 4 mm. Slight ascites. IMPRESSION: 1. Prominence in visualized portions of pancreas with prominence of the pancreatic duct. These are findings potentially  indicating a degree of acute pancreatitis. It may be prudent to consider correlation with contrast enhanced CT or MR of the pancreas to further evaluate. 2.  Slight ascites. 3.  Study otherwise unremarkable. Electronically Signed   By: Lowella Grip III M.D.   On: 11/03/2020 08:39    Procedures Procedures   Medications Ordered in ED Medications  ondansetron (ZOFRAN-ODT) disintegrating tablet 4 mg (4 mg Oral Given 11/02/20 1648)  ondansetron (ZOFRAN) 4 MG/2ML injection (  Given 11/03/20 0758)  sodium chloride 0.9 % bolus 1,000 mL (1,000 mLs Intravenous New Bag/Given 11/03/20 0820)  ketorolac (TORADOL) 30 MG/ML injection 30 mg (30 mg Intravenous Given 11/03/20 0847)    ED Course  I have reviewed the triage vital signs and the nursing notes.  Pertinent labs & imaging results that were available during my care of the patient were reviewed by me and considered in my medical decision making (see chart for details).    MDM Rules/Calculators/A&P                          45 year old female who presents for evaluation of abdominal pain, nausea/vomiting.  She has had intermittent abdominal pain over the last month worsened in the last 2 days.  Associated with nausea/vomiting.  Last night, vomiting started turning green.  On initial arrival, she is afebrile, appears uncomfortable but no acute distress.  She is actively vomiting.  On exam, she has tenderness diffusely but is worse in the right upper quadrant, epigastric and left upper quadrant regions.  Concern for alcohol induced pancreatitis that she does report that she has increased her alcohol use in the last year or so.  Also consider hepatobiliary etiology.  Labs ordered at triage.  Lipase is elevated at 956.  CMP shows normal BUN creatinine.  AST is 49, ALT and alk phos within normal limits.  UA negative for any infectious etiology.  Prominence in visualized portions of pancreas with prominence of the pancreatic duct.  Otherwise unremarkable.  At  this time, patient has some improvement in vomiting but still having some pain.  I suspect this is likely alcohol induced pancreatitis.  We will plan for admission.  Discussed with family practice who accepts patient for admission.   Portions of this note were generated with [employer]. Dictation errors may occur despite best attempts at proofreading.   Final Clinical Impression(s) / ED Diagnoses Final diagnoses:  Abdominal pain  Alcohol-induced acute pancreatitis, unspecified complication status    Rx / DC Orders ED Discharge Orders    None       Desma Mcgregor 11/03/20 0940    Little, Wenda Overland, MD 11/03/20 1154

## 2020-11-04 DIAGNOSIS — F1020 Alcohol dependence, uncomplicated: Secondary | ICD-10-CM | POA: Diagnosis not present

## 2020-11-04 DIAGNOSIS — K5900 Constipation, unspecified: Secondary | ICD-10-CM

## 2020-11-04 DIAGNOSIS — K852 Alcohol induced acute pancreatitis without necrosis or infection: Secondary | ICD-10-CM | POA: Diagnosis not present

## 2020-11-04 LAB — CBC
HCT: 31.4 % — ABNORMAL LOW (ref 36.0–46.0)
Hemoglobin: 10.4 g/dL — ABNORMAL LOW (ref 12.0–15.0)
MCH: 32.5 pg (ref 26.0–34.0)
MCHC: 33.1 g/dL (ref 30.0–36.0)
MCV: 98.1 fL (ref 80.0–100.0)
Platelets: 121 10*3/uL — ABNORMAL LOW (ref 150–400)
RBC: 3.2 MIL/uL — ABNORMAL LOW (ref 3.87–5.11)
RDW: 12.7 % (ref 11.5–15.5)
WBC: 7.1 10*3/uL (ref 4.0–10.5)
nRBC: 0 % (ref 0.0–0.2)

## 2020-11-04 LAB — PROTIME-INR
INR: 1 (ref 0.8–1.2)
Prothrombin Time: 12.9 seconds (ref 11.4–15.2)

## 2020-11-04 LAB — BASIC METABOLIC PANEL
Anion gap: 7 (ref 5–15)
BUN: 8 mg/dL (ref 6–20)
CO2: 22 mmol/L (ref 22–32)
Calcium: 8.1 mg/dL — ABNORMAL LOW (ref 8.9–10.3)
Chloride: 107 mmol/L (ref 98–111)
Creatinine, Ser: 0.67 mg/dL (ref 0.44–1.00)
GFR, Estimated: >60 mL/min (ref >60)
Glucose, Bld: 86 mg/dL (ref 70–99)
Potassium: 3.4 mmol/L — ABNORMAL LOW (ref 3.5–5.1)
Sodium: 136 mmol/L (ref 135–145)

## 2020-11-04 LAB — HEMOGLOBIN A1C
Hgb A1c MFr Bld: 5.1 % (ref 4.8–5.6)
Mean Plasma Glucose: 99.67 mg/dL

## 2020-11-04 MED ORDER — POTASSIUM CHLORIDE CRYS ER 20 MEQ PO TBCR
40.0000 meq | EXTENDED_RELEASE_TABLET | Freq: Once | ORAL | Status: AC
  Administered 2020-11-04: 40 meq via ORAL
  Filled 2020-11-04: qty 2

## 2020-11-04 NOTE — Progress Notes (Signed)
Family Medicine Teaching Service Daily Progress Note Intern Pager: [phone]  Patient name: Gail Blackburn Medical record number: [id number] Date of birth: [birth year] Age: 45 y.o. Gender: female  Primary Care Provider: Leeroy Bock, DO Consultants: None Code Status: Full  Pt Overview and Major Events to Date:  2/8: admitted  Assessment and Plan:  Gail Blackburn is a 45 y.o. female who presented with worsening abdominal pain and PO intolerance. PMH is significant for alcohol use disorder, bipolar disorder, migraines, chronic pain, prior opioid use (now on suboxone), fibromyalgia, anxiety/depression, tobacco use disorder.  Acute Alcoholic Pancreatitis Lipase 956 on admission, RUQ u/s with findings c/w pancreatitis, but negative for biliary stone. Still complains of significant abdominal pain but states the toradol and gabapentin are helpful. Ongoing nausea but no vomiting since admission. -IV fluids-- NS @ 150 cc/hr -NPO, advance diet as tolerated -Toradol 30mg  q6h scheduled -Gabapentin 800mg -1200mg -800mg  -Zofran 4mg  q6h prn -AVOID NARCOTICS -Ongoing alcohol cessation counseling  Constipation Last BM 2/4. I suspect a good portion of her abdominal pain is related to constipation, as her abdomen is distended on exam. -Dulcolax suppository  Alcohol Use Disorder  Acute Alcohol Withdrawal Last drink 2/7. Drinks vodka daily. CIWA scores 6>11>18>7>5>10 since admission. Has received 5mg  Ativan over past 24h. -CIWA monitoring with Ativan -Daily Thiamine, folic acid, multivitamin -TOC consult for substance use disorder  Hypokalemia K 3.4 this morning -Repleted with 40 mEq klor-con x1 -Daily BMP  Anxiety/Depression Chronic, stable. Home meds: Wellbutrin 150mg  daily, Klonopin 0.5mg  prn, and hydroxyzine 50mg  prn (rarely uses hydroxyzine). -Ativan prn for alcohol withdrawal as above (holding home Klonopin) -Continue home Wellbutrin  Migraines Chronic. Home meds: Galacanezumab  monthly, propranolol 60mg  daily and rizatriptan prn -Continue home Propranolol  Chronic Pain On Suboxone 8-2mg  SL BID, gabapentin 800mg  TID, and flexeril prn -Continue home medications -Gabapentin dose increase as discussed above for pancreatitis pain  Tobacco Use Disorder -Nicotine patch 21mg  daily  Normocytic Anemia Hgb 10.4 this morning, from 14 on admission. Likely dilutional as patient has received aggressive fluid hydration for pancreatitis -Continue to monitor  FEN/GI: NPO, advance as tolerated PPx: Lovenox   Status is: Observation The patient will require care spanning > 2 midnights and should be moved to inpatient because: Ongoing active pain requiring inpatient pain management and IV treatments appropriate due to intensity of illness or inability to take PO  Dispo: The patient is from: Home              Anticipated d/c is to: Home              Anticipated d/c date is: 2 days              Patient currently is not medically stable to d/c.   Difficult to place patient No    Subjective:  No acute events overnight. Patient reports ongoing abdominal pain this morning- worst in the RUQ but also in bilateral lower quadrants. Last BM 2/4. Patient also due to start her menstrual cycle shortly. She has ongoing nausea but no vomiting.  Objective: Temp:  [98.3 F (36.8 C)-98.6 F (37 C)] 98.3 F (36.8 C) (02/09 0402) Pulse Rate:  [83-106] 97 (02/09 0402) Resp:  [12-27] 18 (02/09 0402) BP: (122-149)/(76-137) 145/97 (02/09 0600) SpO2:  [97 %-100 %] 97 % (02/09 0402) Weight:  [62 kg] 62 kg (02/09 0100) Physical Exam: General: alert, well-appearing, NAD Cardiovascular: RRR, normal S1/S2 without m/r/g Respiratory: normal WOB, lungs CTAB Abdomen: mildly distended, diffuse tenderness to palpation, worst in epigastric  region and RUQ. Extremities: no peripheral edema Neuro: alert, oriented x3, no tremulousness  Laboratory: Recent Labs  Lab 10/28/20 1456 11/02/20 1645  11/04/20 0344  WBC 7.9 9.7 7.1  HGB 12.7 13.1 10.4*  HCT 39.0 40.8 31.4*  PLT 190 221 121*   Recent Labs  Lab 10/28/20 1456 10/28/20 1456 11/02/20 1645 11/03/20 1311 11/03/20 2100 11/04/20 0344  NA 140   < > 133* 136 136 136  K 4.0  --  4.6 4.0 3.5 3.4*  CL 104  --  101 105 109 107  CO2 23  --  21* 24 20* 22  BUN 6   < > 11 10 9 8   CREATININE 0.64  --  0.67 0.65 0.56 0.67  CALCIUM 9.6  --  9.1 8.1* 7.7* 8.1*  PROT 6.8  --  6.6 5.7*  --   --   BILITOT 0.4  --  0.5 0.8  --   --   ALKPHOS 107  --  85 86  --   --   ALT 21  --  29 21  --   --   AST 35  --  49* 33  --   --   GLUCOSE 87   < > 99 115* 81 86   < > = values in this interval not displayed.    Imaging/Diagnostic Tests: Abdomen Limited RUQ (LIVER/GB) Result Date: 11/03/2020 IMPRESSION: 1. Prominence in visualized portions of pancreas with prominence of the pancreatic duct. These are findings potentially indicating a degree of acute pancreatitis. It may be prudent to consider correlation with contrast enhanced CT or MR of the pancreas to further evaluate. 2.  Slight ascites. 3.  Study otherwise unremarkable.     01/01/2021, MD 11/04/2020, 6:23 AM PGY-1, [facility] Family Medicine FPTS Intern pager: [phone], text pages welcome

## 2020-11-04 NOTE — Plan of Care (Signed)
  Problem: Health Behavior/Discharge Planning: Goal: Ability to manage health-related needs will improve Outcome: Progressing   Problem: Clinical Measurements: Goal: Ability to maintain clinical measurements within normal limits will improve Outcome: Progressing Goal: Will remain free from infection Outcome: Progressing Goal: Diagnostic test results will improve Outcome: Progressing Goal: Respiratory complications will improve Outcome: Progressing Goal: Cardiovascular complication will be avoided Outcome: Progressing   Problem: Coping: Goal: Level of anxiety will decrease Outcome: Progressing   Problem: Elimination: Goal: Will not experience complications related to bowel motility Outcome: Progressing Goal: Will not experience complications related to urinary retention Outcome: Progressing   Problem: Pain Managment: Goal: General experience of comfort will improve Outcome: Progressing   Problem: Safety: Goal: Ability to remain free from injury will improve Outcome: Progressing   Problem: Skin Integrity: Goal: Risk for impaired skin integrity will decrease Outcome: Progressing   

## 2020-11-05 DIAGNOSIS — K5900 Constipation, unspecified: Secondary | ICD-10-CM | POA: Diagnosis present

## 2020-11-05 DIAGNOSIS — F1020 Alcohol dependence, uncomplicated: Secondary | ICD-10-CM | POA: Diagnosis not present

## 2020-11-05 DIAGNOSIS — K852 Alcohol induced acute pancreatitis without necrosis or infection: Secondary | ICD-10-CM | POA: Diagnosis present

## 2020-11-05 DIAGNOSIS — K7689 Other specified diseases of liver: Secondary | ICD-10-CM | POA: Diagnosis not present

## 2020-11-05 DIAGNOSIS — Z79899 Other long term (current) drug therapy: Secondary | ICD-10-CM | POA: Diagnosis not present

## 2020-11-05 DIAGNOSIS — K862 Cyst of pancreas: Secondary | ICD-10-CM | POA: Diagnosis not present

## 2020-11-05 DIAGNOSIS — M797 Fibromyalgia: Secondary | ICD-10-CM | POA: Diagnosis present

## 2020-11-05 DIAGNOSIS — E876 Hypokalemia: Secondary | ICD-10-CM | POA: Diagnosis not present

## 2020-11-05 DIAGNOSIS — Z91048 Other nonmedicinal substance allergy status: Secondary | ICD-10-CM | POA: Diagnosis not present

## 2020-11-05 DIAGNOSIS — E861 Hypovolemia: Secondary | ICD-10-CM | POA: Diagnosis present

## 2020-11-05 DIAGNOSIS — G8929 Other chronic pain: Secondary | ICD-10-CM | POA: Diagnosis present

## 2020-11-05 DIAGNOSIS — F319 Bipolar disorder, unspecified: Secondary | ICD-10-CM | POA: Diagnosis present

## 2020-11-05 DIAGNOSIS — D509 Iron deficiency anemia, unspecified: Secondary | ICD-10-CM | POA: Diagnosis present

## 2020-11-05 DIAGNOSIS — R5382 Chronic fatigue, unspecified: Secondary | ICD-10-CM | POA: Diagnosis present

## 2020-11-05 DIAGNOSIS — F17290 Nicotine dependence, other tobacco product, uncomplicated: Secondary | ICD-10-CM | POA: Diagnosis present

## 2020-11-05 DIAGNOSIS — E871 Hypo-osmolality and hyponatremia: Secondary | ICD-10-CM | POA: Diagnosis present

## 2020-11-05 DIAGNOSIS — G43909 Migraine, unspecified, not intractable, without status migrainosus: Secondary | ICD-10-CM | POA: Diagnosis present

## 2020-11-05 DIAGNOSIS — Z23 Encounter for immunization: Secondary | ICD-10-CM | POA: Diagnosis not present

## 2020-11-05 DIAGNOSIS — E860 Dehydration: Secondary | ICD-10-CM | POA: Diagnosis present

## 2020-11-05 DIAGNOSIS — F10239 Alcohol dependence with withdrawal, unspecified: Secondary | ICD-10-CM | POA: Diagnosis not present

## 2020-11-05 DIAGNOSIS — Z91040 Latex allergy status: Secondary | ICD-10-CM | POA: Diagnosis not present

## 2020-11-05 DIAGNOSIS — Z811 Family history of alcohol abuse and dependence: Secondary | ICD-10-CM | POA: Diagnosis not present

## 2020-11-05 DIAGNOSIS — Z818 Family history of other mental and behavioral disorders: Secondary | ICD-10-CM | POA: Diagnosis not present

## 2020-11-05 DIAGNOSIS — F419 Anxiety disorder, unspecified: Secondary | ICD-10-CM | POA: Diagnosis present

## 2020-11-05 DIAGNOSIS — I82890 Acute embolism and thrombosis of other specified veins: Secondary | ICD-10-CM | POA: Diagnosis not present

## 2020-11-05 DIAGNOSIS — Z20822 Contact with and (suspected) exposure to covid-19: Secondary | ICD-10-CM | POA: Diagnosis present

## 2020-11-05 DIAGNOSIS — K859 Acute pancreatitis without necrosis or infection, unspecified: Secondary | ICD-10-CM | POA: Diagnosis not present

## 2020-11-05 LAB — CBC
HCT: 28.8 % — ABNORMAL LOW (ref 36.0–46.0)
Hemoglobin: 9.8 g/dL — ABNORMAL LOW (ref 12.0–15.0)
MCH: 33.7 pg (ref 26.0–34.0)
MCHC: 34 g/dL (ref 30.0–36.0)
MCV: 99 fL (ref 80.0–100.0)
Platelets: 164 10*3/uL (ref 150–400)
RBC: 2.91 MIL/uL — ABNORMAL LOW (ref 3.87–5.11)
RDW: 13.1 % (ref 11.5–15.5)
WBC: 5.9 10*3/uL (ref 4.0–10.5)
nRBC: 0 % (ref 0.0–0.2)

## 2020-11-05 LAB — BASIC METABOLIC PANEL
Anion gap: 8 (ref 5–15)
BUN: <5 mg/dL — ABNORMAL LOW (ref 6–20)
CO2: 23 mmol/L (ref 22–32)
Calcium: 7.7 mg/dL — ABNORMAL LOW (ref 8.9–10.3)
Chloride: 104 mmol/L (ref 98–111)
Creatinine, Ser: 0.64 mg/dL (ref 0.44–1.00)
GFR, Estimated: >60 mL/min (ref >60)
Glucose, Bld: 86 mg/dL (ref 70–99)
Potassium: 3.1 mmol/L — ABNORMAL LOW (ref 3.5–5.1)
Sodium: 135 mmol/L (ref 135–145)

## 2020-11-05 LAB — PHOSPHORUS: Phosphorus: 1.7 mg/dL — ABNORMAL LOW (ref 2.5–4.6)

## 2020-11-05 LAB — MAGNESIUM: Magnesium: 1.8 mg/dL (ref 1.7–2.4)

## 2020-11-05 MED ORDER — PANTOPRAZOLE SODIUM 40 MG PO TBEC
40.0000 mg | DELAYED_RELEASE_TABLET | Freq: Every day | ORAL | Status: DC
  Administered 2020-11-05 – 2020-11-10 (×6): 40 mg via ORAL
  Filled 2020-11-05 (×6): qty 1

## 2020-11-05 MED ORDER — POTASSIUM CHLORIDE 20 MEQ PO PACK
40.0000 meq | PACK | Freq: Once | ORAL | Status: AC
  Administered 2020-11-05: 40 meq via ORAL
  Filled 2020-11-05: qty 2

## 2020-11-05 MED ORDER — POLYETHYLENE GLYCOL 3350 17 G PO PACK
17.0000 g | PACK | Freq: Two times a day (BID) | ORAL | Status: DC
  Administered 2020-11-05: 17 g via ORAL
  Filled 2020-11-05: qty 1

## 2020-11-05 MED ORDER — POLYETHYLENE GLYCOL 3350 17 G PO PACK
17.0000 g | PACK | Freq: Every day | ORAL | Status: DC
  Administered 2020-11-06: 17 g via ORAL
  Filled 2020-11-05: qty 1

## 2020-11-05 MED ORDER — BISACODYL 10 MG RE SUPP
10.0000 mg | Freq: Once | RECTAL | Status: AC
  Administered 2020-11-05: 10 mg via RECTAL
  Filled 2020-11-05: qty 1

## 2020-11-05 MED ORDER — K PHOS MONO-SOD PHOS DI & MONO 155-852-130 MG PO TABS
250.0000 mg | ORAL_TABLET | Freq: Three times a day (TID) | ORAL | Status: AC
  Administered 2020-11-05 – 2020-11-06 (×3): 250 mg via ORAL
  Filled 2020-11-05 (×3): qty 1

## 2020-11-05 MED ORDER — MAGNESIUM CHLORIDE 64 MG PO TBEC
1.0000 | DELAYED_RELEASE_TABLET | Freq: Every day | ORAL | Status: DC
  Administered 2020-11-05 – 2020-11-08 (×4): 64 mg via ORAL
  Filled 2020-11-05 (×4): qty 1

## 2020-11-05 NOTE — Progress Notes (Addendum)
Family Medicine Teaching Service Daily Progress Note Intern Pager: [phone]  Patient name: Gail Blackburn Medical record number: [id number] Date of birth: [birth year] Age: 45 y.o. Gender: female  Primary Care Provider: Leeroy Bock, DO Consultants: None Code Status: Full  Pt Overview and Major Events to Date:  2/8: admitted  Assessment and Plan:  Gail Blackburn is a 45 y.o. female who presented with worsening abdominal pain and PO intolerance. PMH is significant for alcohol use disorder, bipolar disorder, migraines, chronic pain, prior opioid use (now on suboxone), fibromyalgia, anxiety/depression, tobacco use disorder.  Acute Alcoholic Pancreatitis Pain adequately controlled with current regimen. Tolerating clear liquid diet -Continue IV fluids-- NS @ 150 cc/hr -Will advance to full liquid diet -Toradol 30mg  q6h scheduled (2/8- ) -Gabapentin 800mg -1200mg -800mg  -Zofran 4mg  q6h prn -Will restart home protonix 40mg  daily -AVOID NARCOTICS -Ongoing alcohol cessation counseling  Constipation Patient states she didn't have a true bowel movement yesterday-- feels she just pooped out the suppository but no actual stool. Last BM 2/4. -Will give additional Dulcolax suppository today -Miralax BID -Encouraged ambulation  Alcohol Use Disorder  Acute Alcohol Withdrawal Last drink 2/7. Drinks vodka daily. CIWA scores 7>3>2 overnight. Received 2mg  Ativan over past 24hours. -CIWA monitoring with Ativan -Daily Thiamine, folic acid, multivitamin -TOC consult for substance use disorder  Hypokalemia  Hypophosphatemia K 3.1 this morning. Mag 1.8. Phosphorus was low previously at 2.3. -Repleted with 40 mEq klor-con x2 -PO Mag chloride 64mg  daily -Will recheck phosphorus -Daily BMP, mag, phos  Normocytic Anemia Hgb 9.8 this morning, from 14 on admission and 10.4 yesterday. Likely dilutional as patient has received aggressive fluid hydration for pancreatitis. -Continue to  monitor  Anxiety/Depression Chronic, stable. Home meds: Wellbutrin 150mg  daily, Klonopin 0.5mg  prn, and hydroxyzine 50mg  prn (rarely uses hydroxyzine). Also takes venlafaxine at home. -Ativan prn for alcohol withdrawal as above (holding home Klonopin) -Continue home Wellbutrin -Plan to restart home venlafaxine pending discussion with patient/pharmacy  Migraines Chronic. Home meds: Galacanezumab monthly, propranolol 60mg  daily and rizatriptan prn -Continue home Propranolol  Chronic Pain On Suboxone 8-2mg  SL BID, gabapentin 800mg  TID, and flexeril prn -Continue home medications -Gabapentin dose increase as discussed above for pancreatitis pain -Plan to re-start home venlafaxine   Tobacco Use Disorder -Nicotine patch 21mg  daily   FEN/GI: Full liquid diet, advance as tolerated PPx: Lovenox   Status is: Inpatient Remains inpatient appropriate because: ongoing active pain requiring inpatient pain management  Dispo: The patient is from: Home              Anticipated d/c is to: Home              Anticipated d/c date is: 2 days              Patient currently is not medically stable to d/c.   Difficult to place patient No    Subjective:  No acute events overnight. Patient still with upper>lower abdominal discomfort but states the pain is tolerable. She feels ready to advance her diet. No significant nausea. No vomiting since admission.  Objective: Temp:  [98.2 F (36.8 C)-99.6 F (37.6 C)] 98.2 F (36.8 C) (02/10 0758) Pulse Rate:  [89-105] 92 (02/10 0758) Resp:  [16-18] 16 (02/10 0758) BP: (118-135)/(78-93) 126/92 (02/10 0758) SpO2:  [98 %-99 %] 99 % (02/10 0758) Physical Exam: General: alert, well-appearing, NAD Cardiovascular: RRR, normal S1/S2 without m/r/g Respiratory: normal WOB, lungs CTAB Abdomen: minimally distended, diffuse tenderness to palpation, worst in epigastric region and RUQ. Extremities: no peripheral edema  Neuro: alert, oriented x3, no  tremulousness  Laboratory: Recent Labs  Lab 11/02/20 1645 11/04/20 0344 11/05/20 0332  WBC 9.7 7.1 5.9  HGB 13.1 10.4* 9.8*  HCT 40.8 31.4* 28.8*  PLT 221 121* 164   Recent Labs  Lab 11/02/20 1645 11/03/20 1311 11/03/20 2100 11/04/20 0344 11/05/20 0332  NA 133* 136 136 136 135  K 4.6 4.0 3.5 3.4* 3.1*  CL 101 105 109 107 104  CO2 21* 24 20* 22 23  BUN 11 10 9 8  <5*  CREATININE 0.67 0.65 0.56 0.67 0.64  CALCIUM 9.1 8.1* 7.7* 8.1* 7.7*  PROT 6.6 5.7*  --   --   --   BILITOT 0.5 0.8  --   --   --   ALKPHOS 85 86  --   --   --   ALT 29 21  --   --   --   AST 49* 33  --   --   --   GLUCOSE 99 115* 81 86 86    Imaging/Diagnostic Tests: No new imaging in past 24h.    , MD 11/05/2020, 8:46 AM PGY-1, [facility] Family Medicine FPTS Intern pager: [phone], text pages welcome

## 2020-11-05 NOTE — Progress Notes (Signed)
OT Cancellation Note  Patient Details Name: Gail Blackburn MRN: [id number] DOB: Aug 07, 1976   Cancelled Treatment:    Reason Eval/Treat Not Completed: OT screened, no needs identified, will sign off. Discussed with PT. Pt independent.  Thornell Mule, OT/L   Acute OT Clinical Specialist Acute Rehabilitation Services Pager [phone] Office [phone]  11/05/2020, 3:18 PM

## 2020-11-05 NOTE — TOC Initial Note (Signed)
Transition of Care [facility]) - Initial/Assessment Note    Patient Details  Name: Gail Blackburn MRN: [id number] Date of Birth: [birth year]  [facility]) CM/SW Contact:    Kermit Balo, RN Phone Number: 11/05/2020, 2:32 PM  Clinical Narrative:                 Pt lives at home with her boyfriend. She denies any issues with her home medications or transportation.  Pt has PCP. TOC following.   Expected Discharge Plan: Home/Self Care Barriers to Discharge: Continued Medical Work up   Patient Goals and CMS Choice        Expected Discharge Plan and Services Expected Discharge Plan: Home/Self Care   Discharge Planning Services: CM Consult   Living arrangements for the past 2 months: Single Family Home                                      Prior Living Arrangements/Services Living arrangements for the past 2 months: Single Family Home Lives with:: Significant Other Patient language and need for interpreter reviewed:: Yes Do you feel safe going back to the place where you live?: Yes        Care giver support system in place?: Yes (comment)   Criminal Activity/Legal Involvement Pertinent to Current Situation/Hospitalization: No - Comment as needed  Activities of Daily Living      Permission Sought/Granted                  Emotional Assessment Appearance:: Appears stated age Attitude/Demeanor/Rapport: Engaged Affect (typically observed): Accepting Orientation: : Oriented to Self,Oriented to Place,Oriented to  Time,Oriented to Situation Alcohol / Substance Use: Alcohol Use Psych Involvement: No (comment)  Admission diagnosis:  Acute alcoholic pancreatitis [K85.20] Abdominal pain [R10.9] Alcohol-induced acute pancreatitis, unspecified complication status [K85.20] Patient Active Problem List   Diagnosis Date Noted  . Alcohol use disorder, moderate, dependence (HCC)   . Constipation   . Acute alcoholic pancreatitis 11/03/2020  . Abdominal pain    . Alcohol-induced acute pancreatitis   . Alcohol withdrawal syndrome without complication (HCC)   . Hyponatremia   . Pain of upper abdomen 10/28/2020  . UTI (urinary tract infection) 12/19/2019  . Tinea manuum 05/21/2019  . Abdominal wall lump 02/21/2018  . Chronic fatigue syndrome with fibromyalgia 02/24/2017  . Skin lesion of left leg 02/10/2017  . History of abnormal cervical Pap smear 02/10/2017  . Anxiety 10/15/2014  . Depression 10/15/2014  . Migraine 10/15/2014  . Chronic pain 10/15/2014  . Low TSH level 10/15/2014  . Tobacco use disorder 10/15/2014   PCP:  Leeroy Bock, DO Pharmacy:   [facility] [address], [place] - [address] [place] [address] Phone: [phone] Fax: [phone]  [facility], [place] - [address] [place] [address] Phone: [phone] Fax: [phone]  [facility]) - [place], [place] - [address] [place] [address] Phone: [phone] Fax: [phone]  [id number] [place], [place] - [address] [place] [address] Phone: [phone] Fax: [phone]     Social Determinants of Health (SDOH) Interventions    Readmission Risk Interventions No flowsheet data found.

## 2020-11-05 NOTE — Plan of Care (Signed)
  Problem: Health Behavior/Discharge Planning: Goal: Ability to manage health-related needs will improve Outcome: Progressing   Problem: Clinical Measurements: Goal: Ability to maintain clinical measurements within normal limits will improve Outcome: Progressing Goal: Will remain free from infection Outcome: Progressing Goal: Diagnostic test results will improve Outcome: Progressing Goal: Respiratory complications will improve Outcome: Progressing Goal: Cardiovascular complication will be avoided Outcome: Progressing   Problem: Coping: Goal: Level of anxiety will decrease Outcome: Progressing   Problem: Elimination: Goal: Will not experience complications related to urinary retention Outcome: Progressing   Problem: Safety: Goal: Ability to remain free from injury will improve Outcome: Progressing   Problem: Skin Integrity: Goal: Risk for impaired skin integrity will decrease Outcome: Progressing

## 2020-11-05 NOTE — Progress Notes (Signed)
Physical Therapy Evaluation and Discharge Patient Details Name: Gail Blackburn MRN: [id number] DOB: [birth year] Today's Date: 11/05/2020   History of Present Illness  Pt is a 45 y/o female admitted secondary to alcoholic pancreatitis. PMH includes alcohol use, tobacco use, fibromyalgia, bipolar disorder, and migraine.  Clinical Impression  Patient evaluated by Physical Therapy with no further acute PT needs identified. All education has been completed and the patient has no further questions. Pt overall at an independent and mod I level for gait and stair navigation. Reporting L lower back pain and abdominal pain, but did not limit mobility. Educated about importance of mobility while in the hospital. See below for any follow-up Physical Therapy or equipment needs. PT is signing off. Thank you for this referral. If needs change, please re-consult.      Follow Up Recommendations No PT follow up    Equipment Recommendations  None recommended by PT    Recommendations for Other Services       Precautions / Restrictions Precautions Precautions: None Restrictions Weight Bearing Restrictions: No      Mobility  Bed Mobility Overal bed mobility: Independent                  Transfers Overall transfer level: Independent                  Ambulation/Gait Ambulation/Gait assistance: Independent Gait Distance (Feet): 200 Feet Assistive device: None Gait Pattern/deviations: WFL(Within Functional Limits) Gait velocity: WFL   General Gait Details: No LOB noted. Overall independent. Educated about importance of mobility while in the hospital.  Stairs Stairs: Yes Stairs assistance: Modified independent (Device/Increase time) Stair Management: One rail Right;Alternating pattern;Forwards Number of Stairs: 4 General stair comments: Overall steady with stair navigation. No LOB noted.  Wheelchair Mobility    Modified Rankin (Stroke Patients Only)       Balance Overall  balance assessment: Independent                                           Pertinent Vitals/Pain Pain Assessment: 0-10 Pain Score: 6  Pain Location: abdomen and L lower back Pain Descriptors / Indicators: Aching Pain Intervention(s): Limited activity within patient's tolerance;Monitored during session;Repositioned    Home Living Family/patient expects to be discharged to:: Private residence Living Arrangements: Children Available Help at Discharge: Family;Available PRN/intermittently Type of Home: House Home Access: Stairs to enter Entrance Stairs-Rails: Lawyer of Steps: 4 Home Layout: Two level Home Equipment: None      Prior Function Level of Independence: Independent               Hand Dominance        Extremity/Trunk Assessment   Upper Extremity Assessment Upper Extremity Assessment: Overall WFL for tasks assessed    Lower Extremity Assessment Lower Extremity Assessment: Overall WFL for tasks assessed    Cervical / Trunk Assessment Cervical / Trunk Assessment: Normal  Communication   Communication: No difficulties  Cognition Arousal/Alertness: Awake/alert Behavior During Therapy: WFL for tasks assessed/performed Overall Cognitive Status: Within Functional Limits for tasks assessed                                        General Comments      Exercises     Assessment/Plan  PT Assessment Patent does not need any further PT services  PT Problem List         PT Treatment Interventions      PT Goals (Current goals can be found in the Care Plan section)  Acute Rehab PT Goals Patient Stated Goal: to decrease pain PT Goal Formulation: With patient Time For Goal Achievement: 11/05/20 Potential to Achieve Goals: Good    Frequency     Barriers to discharge        Co-evaluation               AM-PAC PT "6 Clicks" Mobility  Outcome Measure Help needed turning from your back  to your side while in a flat bed without using bedrails?: None Help needed moving from lying on your back to sitting on the side of a flat bed without using bedrails?: None Help needed moving to and from a bed to a chair (including a wheelchair)?: None Help needed standing up from a chair using your arms (e.g., wheelchair or bedside chair)?: None Help needed to walk in hospital room?: None Help needed climbing 3-5 steps with a railing? : None 6 Click Score: 24    End of Session   Activity Tolerance: Patient tolerated treatment well Patient left: in bed;with call bell/phone within reach Nurse Communication: Mobility status PT Visit Diagnosis: Other abnormalities of gait and mobility (R26.89)    Time: 3491-7915 PT Time Calculation (min) (ACUTE ONLY): 13 min   Charges:   PT Evaluation $PT Eval Low Complexity: 1 Low          Cindee Salt, DPT  Acute Rehabilitation Services  Pager: [phone] Office: [phone]   [facility] Prom 11/05/2020, 3:37 PM

## 2020-11-05 NOTE — TOC CAGE-AID Note (Signed)
Transition of Care [facility]) - CAGE-AID Screening   Patient Details  Name: Codee Bloodworth MRN: [id number] Date of Birth: 1976/03/07  [facility] CM/SW Contact:    Kermit Balo, RN Phone Number: 11/05/2020, 2:29 PM   Clinical Narrative: Pt accepting of resources for outpatient and inpatient drug/ alcohol counseling.   CAGE-AID Screening:    Have You Ever Felt You Ought to Cut Down on Your Drinking or Drug Use?: Yes Have People Annoyed You By Critizing Your Drinking Or Drug Use?: Yes Have You Felt Bad Or Guilty About Your Drinking Or Drug Use?: Yes Have You Ever Had a Drink or Used Drugs First Thing In The Morning to Steady Your Nerves or to Get Rid of a Hangover?: No CAGE-AID Score: 3  Substance Abuse Education Offered: Yes  Substance abuse interventions: Patient Counseling,Educational Materials

## 2020-11-06 ENCOUNTER — Ambulatory Visit: Payer: Medicaid Other | Admitting: Physical Therapy

## 2020-11-06 DIAGNOSIS — K852 Alcohol induced acute pancreatitis without necrosis or infection: Secondary | ICD-10-CM | POA: Diagnosis not present

## 2020-11-06 DIAGNOSIS — K5900 Constipation, unspecified: Secondary | ICD-10-CM | POA: Diagnosis not present

## 2020-11-06 DIAGNOSIS — F1020 Alcohol dependence, uncomplicated: Secondary | ICD-10-CM | POA: Diagnosis not present

## 2020-11-06 LAB — BASIC METABOLIC PANEL
Anion gap: 7 (ref 5–15)
BUN: <5 mg/dL — ABNORMAL LOW (ref 6–20)
CO2: 22 mmol/L (ref 22–32)
Calcium: 8.1 mg/dL — ABNORMAL LOW (ref 8.9–10.3)
Chloride: 107 mmol/L (ref 98–111)
Creatinine, Ser: 0.51 mg/dL (ref 0.44–1.00)
GFR, Estimated: >60 mL/min (ref >60)
Glucose, Bld: 92 mg/dL (ref 70–99)
Potassium: 3.3 mmol/L — ABNORMAL LOW (ref 3.5–5.1)
Sodium: 136 mmol/L (ref 135–145)

## 2020-11-06 LAB — CBC
HCT: 24.9 % — ABNORMAL LOW (ref 36.0–46.0)
Hemoglobin: 8.6 g/dL — ABNORMAL LOW (ref 12.0–15.0)
MCH: 33.3 pg (ref 26.0–34.0)
MCHC: 34.5 g/dL (ref 30.0–36.0)
MCV: 96.5 fL (ref 80.0–100.0)
Platelets: 186 10*3/uL (ref 150–400)
RBC: 2.58 MIL/uL — ABNORMAL LOW (ref 3.87–5.11)
RDW: 13.2 % (ref 11.5–15.5)
WBC: 5.4 10*3/uL (ref 4.0–10.5)
nRBC: 0 % (ref 0.0–0.2)

## 2020-11-06 LAB — PHOSPHORUS: Phosphorus: 2.8 mg/dL (ref 2.5–4.6)

## 2020-11-06 LAB — MAGNESIUM: Magnesium: 1.8 mg/dL (ref 1.7–2.4)

## 2020-11-06 MED ORDER — LORAZEPAM 1 MG PO TABS
1.0000 mg | ORAL_TABLET | ORAL | Status: DC | PRN
  Administered 2020-11-06: 3 mg via ORAL
  Administered 2020-11-06 – 2020-11-07 (×2): 2 mg via ORAL
  Filled 2020-11-06: qty 2
  Filled 2020-11-06: qty 4
  Filled 2020-11-06: qty 2

## 2020-11-06 MED ORDER — LORAZEPAM 2 MG/ML IJ SOLN
1.0000 mg | INTRAMUSCULAR | Status: DC | PRN
  Administered 2020-11-07: 1 mg via INTRAVENOUS
  Administered 2020-11-07 – 2020-11-08 (×2): 2 mg via INTRAVENOUS
  Filled 2020-11-06 (×5): qty 1

## 2020-11-06 MED ORDER — POLYETHYLENE GLYCOL 3350 17 G PO PACK
17.0000 g | PACK | Freq: Two times a day (BID) | ORAL | Status: DC
  Administered 2020-11-06 – 2020-11-10 (×6): 17 g via ORAL
  Filled 2020-11-06 (×5): qty 1

## 2020-11-06 MED ORDER — POTASSIUM CHLORIDE 20 MEQ PO PACK
20.0000 meq | PACK | Freq: Once | ORAL | Status: AC
  Administered 2020-11-06: 20 meq via ORAL
  Filled 2020-11-06: qty 1

## 2020-11-06 MED ORDER — ACETAMINOPHEN 500 MG PO TABS
1000.0000 mg | ORAL_TABLET | Freq: Four times a day (QID) | ORAL | Status: DC
  Administered 2020-11-06 – 2020-11-10 (×16): 1000 mg via ORAL
  Filled 2020-11-06 (×16): qty 2

## 2020-11-06 MED ORDER — POTASSIUM CHLORIDE CRYS ER 20 MEQ PO TBCR
40.0000 meq | EXTENDED_RELEASE_TABLET | Freq: Four times a day (QID) | ORAL | Status: AC
  Administered 2020-11-06 (×2): 40 meq via ORAL
  Filled 2020-11-06 (×2): qty 2

## 2020-11-06 NOTE — Plan of Care (Signed)
  Problem: Clinical Measurements: Goal: Ability to maintain clinical measurements within normal limits will improve Outcome: Progressing Goal: Will remain free from infection Outcome: Progressing Goal: Diagnostic test results will improve Outcome: Progressing Goal: Respiratory complications will improve Outcome: Progressing Goal: Cardiovascular complication will be avoided Outcome: Progressing   Problem: Coping: Goal: Level of anxiety will decrease Outcome: Progressing   Problem: Safety: Goal: Ability to remain free from injury will improve Outcome: Progressing   

## 2020-11-06 NOTE — Progress Notes (Signed)
Family Medicine Teaching Service Daily Progress Note Intern Pager: [phone]  Patient name: Gail Blackburn Medical record number: [id number] Date of birth: [birth year] Age: 45 y.o. Gender: female  Primary Care Provider: Leeroy Bock, DO Consultants: None Code Status: Full  Pt Overview and Major Events to Date:  2/8: admitted  Assessment and Plan:  Gail Blackburn is a 45 y.o. female who presented with worsening abdominal pain and PO intolerance. PMH is significant for alcohol use disorder, bipolar disorder, migraines, chronic pain, prior opioid use (now on suboxone), fibromyalgia, anxiety/depression, tobacco use disorder.  Acute Alcoholic Pancreatitis Still with upper abdominal pain this morning that is unchanged from prior. Was able to tolerate soup and grits yesterday. -Will get CT abdomen/pelvis to rule out cyst or other etiology -Continue full liquid diet, advance as tolerated -d/c IV fluids -d/c Toradol -Tylenol 1000mg  q6h scheduled -Gabapentin 800mg -1200mg -800mg  -Zofran 4mg  q6h prn -Protonix 40mg  daily -AVOID NARCOTICS -Ongoing alcohol cessation counseling  Normocytic Anemia Hgb 8.6 this morning. Hgb trend since admission 13.2>10.4>9.8>8.6. Thought to be dilutional initially. No signs of bleeding. With ongoing abdominal pain and dropping hemoglobin, will obtain CT abdomen/pelvis. -Abdominal imaging as discussed above -d/c IV fluids -Will check iron studies, vit B12 -Daily CBC -Protonix 40mg  daily  Constipation Last true BM 2/4. S/p dulcolax suppository x2 this admission. -CT abdomen/pelvis as above -Miralax daily -Depending on stool burden seen on imaging, may need additional Miralax/enema etc. -Encouraged ambulation  Alcohol Use Disorder  Acute Alcohol Withdrawal Last drink 2/7. Drinks vodka daily. CIWA scores 9>4 overnight. Received 4mg  Ativan over past 12h. -CIWA monitoring with Ativan prn -Daily thiamine, folic acid, multivitamin -TOC consult for  substance use disorder  Hypokalemia K 3.3 this morning. Mag 1.8. Phosphorus wnl at 2.8 -Will replete with 40 mEq klor-con x2 -PO Mag chloride 64mg  daily -Daily BMP, mag, phos  Anxiety/Depression Chronic, stable. Home meds: Wellbutrin 150mg  daily, Klonopin 0.5mg  prn, and hydroxyzine 50mg  prn (rarely uses hydroxyzine). Reportedly self-weaned her venlafaxine two months ago. -Ativan prn for alcohol withdrawal as above (holding home Klonopin) -Continue home Wellbutrin -Consider restarting venlafaxine  Migraines Chronic. Home meds: Galacanezumab monthly, propranolol 60mg  daily and rizatriptan prn -Continue home Propranolol  Chronic Pain On Suboxone 8-2mg  SL BID, gabapentin 800mg  TID, and flexeril prn -Continue home medications -Gabapentin dose increase as discussed above for pancreatitis pain  Tobacco Use Disorder -Nicotine patch 21mg  daily   FEN/GI: Full liquid diet, advance as tolerated PPx: Lovenox   Status is: Inpatient Remains inpatient appropriate because: ongoing active pain requiring inpatient pain management  Dispo: The patient is from: Home              Anticipated d/c is to: Home              Anticipated d/c date is: 2 days              Patient currently is not medically stable to d/c.   Difficult to place patient No    Subjective:  No acute events overnight. Patient still with upper abdominal pain this morning but was able to tolerate grits and soup. Still has not had BM. Also complains of feeling anxious about being in the hospital.  Objective: Temp:  [97.9 F (36.6 C)-98.8 F (37.1 C)] 98.3 F (36.8 C) (02/11 0327) Pulse Rate:  [82-93] 91 (02/11 0327) Resp:  [16-18] 17 (02/11 0327) BP: (114-138)/(78-95) 135/94 (02/11 0327) SpO2:  [97 %-100 %] 97 % (02/11 0327) Physical Exam: General: alert, well-appearing, NAD Cardiovascular: RRR, normal S1/S2  without m/r/g Respiratory: normal WOB, lungs CTAB Abdomen: minimally distended, diffuse tenderness to  palpation, worst in epigastric region and RUQ. Extremities: no peripheral edema Neuro: alert, oriented x3, no tremulousness  Laboratory: Recent Labs  Lab 11/04/20 0344 11/05/20 0332 11/06/20 0300  WBC 7.1 5.9 5.4  HGB 10.4* 9.8* 8.6*  HCT 31.4* 28.8* 24.9*  PLT 121* 164 186   Recent Labs  Lab 11/02/20 1645 11/03/20 1311 11/03/20 2100 11/04/20 0344 11/05/20 0332 11/06/20 0300  NA 133* 136   < > 136 135 136  K 4.6 4.0   < > 3.4* 3.1* 3.3*  CL 101 105   < > 107 104 107  CO2 21* 24   < > 22 23 22   BUN 11 10   < > 8 <5* <5*  CREATININE 0.67 0.65   < > 0.67 0.64 0.51  CALCIUM 9.1 8.1*   < > 8.1* 7.7* 8.1*  PROT 6.6 5.7*  --   --   --   --   BILITOT 0.5 0.8  --   --   --   --   ALKPHOS 85 86  --   --   --   --   ALT 29 21  --   --   --   --   AST 49* 33  --   --   --   --   GLUCOSE 99 115*   < > 86 86 92   < > = values in this interval not displayed.    Imaging/Diagnostic Tests: No new imaging in past 24h.    , MD 11/06/2020, 6:43 AM PGY-1, [facility] FPTS Intern pager: [phone], text pages welcome

## 2020-11-07 ENCOUNTER — Inpatient Hospital Stay (HOSPITAL_COMMUNITY): Payer: Medicaid Other

## 2020-11-07 DIAGNOSIS — K852 Alcohol induced acute pancreatitis without necrosis or infection: Secondary | ICD-10-CM | POA: Diagnosis not present

## 2020-11-07 LAB — CBC
HCT: 31.9 % — ABNORMAL LOW (ref 36.0–46.0)
Hemoglobin: 10.5 g/dL — ABNORMAL LOW (ref 12.0–15.0)
MCH: 32.5 pg (ref 26.0–34.0)
MCHC: 32.9 g/dL (ref 30.0–36.0)
MCV: 98.8 fL (ref 80.0–100.0)
Platelets: 292 10*3/uL (ref 150–400)
RBC: 3.23 MIL/uL — ABNORMAL LOW (ref 3.87–5.11)
RDW: 13.2 % (ref 11.5–15.5)
WBC: 6.1 10*3/uL (ref 4.0–10.5)
nRBC: 0 % (ref 0.0–0.2)

## 2020-11-07 LAB — BASIC METABOLIC PANEL
Anion gap: 10 (ref 5–15)
BUN: <5 mg/dL — ABNORMAL LOW (ref 6–20)
CO2: 25 mmol/L (ref 22–32)
Calcium: 9.1 mg/dL (ref 8.9–10.3)
Chloride: 103 mmol/L (ref 98–111)
Creatinine, Ser: 0.53 mg/dL (ref 0.44–1.00)
GFR, Estimated: >60 mL/min (ref >60)
Glucose, Bld: 89 mg/dL (ref 70–99)
Potassium: 3.6 mmol/L (ref 3.5–5.1)
Sodium: 138 mmol/L (ref 135–145)

## 2020-11-07 LAB — IRON AND TIBC
Iron: 26 ug/dL — ABNORMAL LOW (ref 28–170)
Saturation Ratios: 8 % — ABNORMAL LOW (ref 10.4–31.8)
TIBC: 328 ug/dL (ref 250–450)
UIBC: 302 ug/dL

## 2020-11-07 LAB — VITAMIN B12: Vitamin B-12: 1117 pg/mL — ABNORMAL HIGH (ref 180–914)

## 2020-11-07 LAB — FERRITIN: Ferritin: 121 ng/mL (ref 11–307)

## 2020-11-07 LAB — MAGNESIUM: Magnesium: 2 mg/dL (ref 1.7–2.4)

## 2020-11-07 LAB — PHOSPHORUS: Phosphorus: 5 mg/dL — ABNORMAL HIGH (ref 2.5–4.6)

## 2020-11-07 MED ORDER — SODIUM CHLORIDE 0.9% FLUSH
10.0000 mL | Freq: Two times a day (BID) | INTRAVENOUS | Status: DC
  Administered 2020-11-08 – 2020-11-10 (×4): 10 mL

## 2020-11-07 MED ORDER — INFLUENZA VAC SPLIT QUAD 0.5 ML IM SUSY
0.5000 mL | PREFILLED_SYRINGE | INTRAMUSCULAR | Status: AC
  Administered 2020-11-10: 0.5 mL via INTRAMUSCULAR
  Filled 2020-11-07: qty 0.5

## 2020-11-07 MED ORDER — SODIUM CHLORIDE 0.9% FLUSH
10.0000 mL | INTRAVENOUS | Status: DC | PRN
Start: 2020-11-07 — End: 2020-11-11
  Administered 2020-11-08: 10 mL

## 2020-11-07 MED ORDER — GADOBUTROL 1 MMOL/ML IV SOLN
6.5000 mL | Freq: Once | INTRAVENOUS | Status: AC | PRN
  Administered 2020-11-07: 6.5 mL via INTRAVENOUS

## 2020-11-07 MED ORDER — HYDROXYZINE HCL 25 MG PO TABS
50.0000 mg | ORAL_TABLET | Freq: Three times a day (TID) | ORAL | Status: DC | PRN
  Administered 2020-11-07 – 2020-11-09 (×3): 50 mg via ORAL
  Filled 2020-11-07 (×3): qty 2

## 2020-11-07 MED ORDER — SODIUM CHLORIDE 0.9 % IV SOLN: INTRAVENOUS | Status: DC

## 2020-11-07 MED ORDER — IOHEXOL 300 MG/ML  SOLN
80.0000 mL | Freq: Once | INTRAMUSCULAR | Status: AC | PRN
  Administered 2020-11-07: 80 mL via INTRAVENOUS

## 2020-11-07 MED ORDER — KETOROLAC TROMETHAMINE 15 MG/ML IJ SOLN
15.0000 mg | Freq: Four times a day (QID) | INTRAMUSCULAR | Status: DC
  Administered 2020-11-07 – 2020-11-08 (×5): 15 mg via INTRAVENOUS
  Filled 2020-11-07 (×5): qty 1

## 2020-11-07 NOTE — Progress Notes (Signed)
Family Medicine Teaching Service Daily Progress Note Intern Pager: [phone]  Patient name: Gail Blackburn Medical record number: [id number] Date of birth: 05-07-1976 Age: 45 y.o. Gender: female  Primary Care Provider: Leeroy Bock, DO Consultants: GI Code Status: Full  Pt Overview and Major Events to Date:  2/8: admitted  Assessment and Plan: Lamerle Jabs a 45 y.o.femalewho presented with worsening abdominal pain and PO intolerance. PMH is significant foralcohol use disorder, bipolar disorder, migraines, chronic pain, prior opioid use (now on suboxone), fibromyalgia, anxiety/depression, tobacco use disorder.  Acute Alcoholic Pancreatitis Continues to have abdominal pain.  CT abd shows non occlusive splenic thrombosis. MRI liver shows pancreatic and peripancreatic inflammation owith early pseudocyst formation along the anterior and inferior margin of the pancreatic body. Abdomen tender on exam.   -Continue Tylenol, Gabapentin for pain -Continue Toradol 15 mg IV x5 days -Continue Zofran and Protonix -Restart IV NS/100/hr -NPO and continue for at least 24-48 hrs to allow for abdominal rest -Avoid Narcotics  Normocytic Anemia Iron panel suggestive of Iron deficiency. -Can consider IV iron replacement if worsening Hbg or symptomatic -Po iron on discharge -Monitor CBC  Constipation -Continue current regime   Alcohol Use Disorder  Acute Alcohol Withdrawal CIWA's 3>5 Continue CIWA protocol Continue Ativan Hold off on Clonipin for now -Continue daily thiamine, multivitamins and folic acid.  Hypokalemia Resolved -BMP in am  Anxiety/Depression Stable.  She is very anxious.and would like to restart her Klonipin. -Will hold off on Klonipin -CIWA protocol -Continue home Wellbutrin  Migraines Continue home Propanol   Chronic Pain On Suboxone, Gabapentin and Flexeril at hom -Continue above  Tobacco Use Disorder -Nicotine patch daily  FEN/GI: Full liquid  diet, advance as tolerated PPx: Lovenox   Status is: Inpatient  Remains inpatient appropriate because:Ongoing active pain requiring inpatient pain management   Dispo: The patient is from: Home              Anticipated d/c is to: Home              Anticipated d/c date is: 3 days              Patient currently is not medically stable to d/c.   Difficult to place patient No        Subjective:  No acute events overnight. Continues to have abdominal pain.  Tylenol not really helping. Would like Toradol again.  Doe not want Narcotics.  She has not moved bowels yet either.  Reports that abdomen has not gotten more distended.  Objective: Temp:  [98 F (36.7 C)-98.4 F (36.9 C)] 98 F (36.7 C) (02/12 0342) Pulse Rate:  [74-99] 82 (02/12 0559) Resp:  [16-20] 17 (02/12 0342) BP: (119-144)/(66-103) 125/83 (02/12 0342) SpO2:  [98 %-100 %] 98 % (02/12 0342)  Physical Exam:  General: 45 y.o. female in NAD Cardio: RRR no m/r/g Lungs: CTAB, no wheezing, no rhonchi, no crackles, no IWOB on room air Abdomen: Soft, mid epigastric pain on light palpation, non-distended, positive bowel sounds Skin: warm and dry Extremities: No edema  Laboratory: Recent Labs  Lab 11/05/20 0332 11/06/20 0300 11/07/20 0524  WBC 5.9 5.4 6.1  HGB 9.8* 8.6* 10.5*  HCT 28.8* 24.9* 31.9*  PLT 164 186 292   Recent Labs  Lab 11/02/20 1645 11/03/20 1311 11/03/20 2100 11/05/20 0332 11/06/20 0300 11/07/20 0524  NA 133* 136   < > 135 136 138  K 4.6 4.0   < > 3.1* 3.3* 3.6  CL 101 105   < >  104 107 103  CO2 21* 24   < > 23 22 25   BUN 11 10   < > <5* <5* <5*  CREATININE 0.67 0.65   < > 0.64 0.51 0.53  CALCIUM 9.1 8.1*   < > 7.7* 8.1* 9.1  PROT 6.6 5.7*  --   --   --   --   BILITOT 0.5 0.8  --   --   --   --   ALKPHOS 85 86  --   --   --   --   ALT 29 21  --   --   --   --   AST 49* 33  --   --   --   --   GLUCOSE 99 115*   < > 86 92 89   < > = values in this interval not displayed.       Imaging/Diagnostic Tests: CT ABDOMEN PELVIS W CONTRAST  Result Date: 11/07/2020 CLINICAL DATA:  Pancreatitis, persistent. EXAM: CT ABDOMEN AND PELVIS WITH CONTRAST TECHNIQUE: Multidetector CT imaging of the abdomen and pelvis was performed using the standard protocol following bolus administration of intravenous contrast. CONTRAST:  62mL OMNIPAQUE IOHEXOL 300 MG/ML  SOLN COMPARISON:  Ultrasound abdomen 11/03/2020, CT abdomen pelvis 01/03/2009 FINDINGS: Lower chest: Bilateral trace, left greater than right, pleural effusions. Bilateral lower lobe linear atelectasis versus scarring. Hepatobiliary: Vague pericentimeter peripheral hypodensity within the right hepatic lobe (3:24). Suggestion of another vague hypodensity within the right hepatic lobe measuring approximately 2.6 cm (3:11). No gallstones, gallbladder wall thickening, or pericholecystic fluid. No biliary dilatation. Pancreas: Hazy pancreatic contour with associated peripancreatic simple free fluid as well as fat stranding. Possibly forming fluid collection inferior to the pancreatic body with however no definite pseudocyst formation. Spleen: Normal in size without focal abnormality. Adrenals/Urinary Tract: No adrenal nodule bilaterally. Bilateral kidneys enhance symmetrically. No hydronephrosis. No hydroureter. The urinary bladder is unremarkable. Stomach/Bowel: PO contrast reaches the sigmoid colon. Stomach is within normal limits. No evidence of bowel wall thickening or dilatation. Stool throughout the colon. Appendix appears normal. Vascular/Lymphatic: The portal and superior mesenteric veins are patent. There is a nonocclusive filling defect within the splenic vein (7:77). No abdominal aorta or iliac aneurysm. No splenic artery aneurysm formation; However, limited evaluation on this portal venous phase study. Prominent but nonenlarged retroperitoneal lymph nodes. No abdominal, pelvic, or inguinal lymphadenopathy. Reproductive: Uterus and  bilateral adnexa are unremarkable. Other: Trace to small volume simple free fluid within the abdomen and pelvis. No intraperitoneal free gas. No organized fluid collection. Musculoskeletal: No acute or significant osseous findings. IMPRESSION: 1. Bilateral trace pleural effusions. 2. Acute pancreatitis with no definite pseudocyst formation/drainable organized fluid collection. Associated trace to small volume simple fluid ascites. 3. Nonocclusive splenic vein thrombosis. 4. Couple of vague, indeterminate, 1-2 cm right hepatic lobe lesions. Consider MRI liver protocol for further evaluation. 5. Constipation. Electronically Signed   By: 03/05/2009 M.D.   On: 11/07/2020 02:11   MR LIVER W WO CONTRAST  Result Date: 11/07/2020 CLINICAL DATA:  Liver lesion. Worsening abdominal pain. Recent pancreatitis and nonocclusive splenic vein thrombosis. EXAM: MRI ABDOMEN WITHOUT AND WITH CONTRAST TECHNIQUE: Multiplanar multisequence MR imaging of the abdomen was performed both before and after the administration of intravenous contrast. CONTRAST:  6.17mL GADAVIST GADOBUTROL 1 MMOL/ML IV SOLN COMPARISON:  CT abdomen 11/07/2020 FINDINGS: Despite efforts by the technologist and patient, motion artifact is present on today's exam and could not be eliminated. This reduces exam sensitivity and specificity. Lower  chest: Small bilateral pleural effusions. Hepatobiliary: In 1 of the areas of concern in the right hepatic lobe about at the level of the gallbladder fossa laterally, there is a suggestion of very subtle accentuated T2 signal in the parenchyma on image 19 of series 9 and some equivocal accentuated delayed enhancement for example on image 41 of series 24, but otherwise this lesion is essentially occult on MRI. The lesion does not have worrisome characteristics and presumably could represent a tiny focus of subcapsular fibrosis or equivocal local inflammation or injury. Of other areas of hypodensity closer to the dome of  the right hepatic lobe on recent CT have no MRI lesion correlate and are likely benign/incidental. The gallbladder appears unremarkable. Common bile duct 0.5 cm in diameter, without a well-defined filling defect and with normal distal tapering appearance. Pancreas: Pancreatic and peripancreatic inflammation noted. There is suspicion for early pseudocyst formation along the anterior and inferior margin of the pancreatic body, with an irregular collection of tubular fluid signal with enhancing margins measuring about 6.2 by 1.9 by 1.3 cm, for example shown on images 46-57 of series 26. Given the enhancement of the margins, these are probably early pseudocysts rather than simply acute fluid collections. I not see compelling findings of pancreatic necrosis, and there is no gas in these lesions to further suggest pancreatic abscess. Spleen:  Unremarkable Adrenals/Urinary Tract:  Unremarkable Stomach/Bowel: Unremarkable Vascular/Lymphatic: There continues to be some localized narrowing in the left renal vein along the pancreatic tail suspicious for nonocclusive thrombus. This does not appear changed in configuration compared to the earlier CT scan. Other: Trace free fluid along the right hepatic lobe margin. Subcutaneous edema along the flanks and lateral abdominal wall musculature. Musculoskeletal: Unremarkable IMPRESSION: 1. Pancreatic and peripancreatic inflammation with early pseudocyst formation along the anterior and inferior margin of the pancreatic body. No findings of pancreatic necrosis. 2. 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 3. In spell of the areas of concern in the right hepatic lobe about the level of the gallbladder fossa laterally, there is a suggestion of very subtle 0.8 cm focus of accentuated T2 signal in the parenchyma and some equivocal accentuated delayed enhancement. This presumably could represent a tiny focus of subcapsular  fibrosis or equivocal local inflammation or injury. The other areas of hypodensity closer to the dome of the right hepatic lobe on recent CT have no MRI characteristics and are likely benign/incidental. 4. Small bilateral pleural effusions. Subcutaneous edema along the flanks and lateral abdominal wall musculature. 5. Common bile duct 0.5 cm in diameter, without a well-defined filling defect and with normal distal tapering appearance. 6. Despite efforts by the technologist and patient, motion artifact is present on today's exam and could not be eliminated. This reduces exam sensitivity and specificity. Electronically Signed   By: Gaylyn Rong M.D.   On: 11/07/2020 10:25    Dana Allan, MD 11/07/2020, 6:58 AM PGY-2, [facility] FPTS Intern pager: [phone], text pages welcome

## 2020-11-07 NOTE — Plan of Care (Signed)
  Problem: Health Behavior/Discharge Planning: Goal: Ability to manage health-related needs will improve Outcome: Progressing   Problem: Clinical Measurements: Goal: Ability to maintain clinical measurements within normal limits will improve Outcome: Progressing Goal: Will remain free from infection Outcome: Progressing Goal: Diagnostic test results will improve Outcome: Progressing Goal: Respiratory complications will improve Outcome: Progressing Goal: Cardiovascular complication will be avoided Outcome: Progressing   Problem: Pain Managment: Goal: General experience of comfort will improve Outcome: Progressing   

## 2020-11-07 NOTE — Progress Notes (Signed)
Patient independent and ambulatory told to call if needs something and not to walk out of her room without calling the RN

## 2020-11-08 DIAGNOSIS — K852 Alcohol induced acute pancreatitis without necrosis or infection: Secondary | ICD-10-CM | POA: Diagnosis not present

## 2020-11-08 DIAGNOSIS — F1020 Alcohol dependence, uncomplicated: Secondary | ICD-10-CM | POA: Diagnosis not present

## 2020-11-08 DIAGNOSIS — K5900 Constipation, unspecified: Secondary | ICD-10-CM | POA: Diagnosis not present

## 2020-11-08 LAB — CBC
HCT: 29.4 % — ABNORMAL LOW (ref 36.0–46.0)
Hemoglobin: 9.6 g/dL — ABNORMAL LOW (ref 12.0–15.0)
MCH: 32.2 pg (ref 26.0–34.0)
MCHC: 32.7 g/dL (ref 30.0–36.0)
MCV: 98.7 fL (ref 80.0–100.0)
Platelets: 285 10*3/uL (ref 150–400)
RBC: 2.98 MIL/uL — ABNORMAL LOW (ref 3.87–5.11)
RDW: 13.3 % (ref 11.5–15.5)
WBC: 5.7 10*3/uL (ref 4.0–10.5)
nRBC: 0 % (ref 0.0–0.2)

## 2020-11-08 LAB — BASIC METABOLIC PANEL
Anion gap: 11 (ref 5–15)
BUN: <5 mg/dL — ABNORMAL LOW (ref 6–20)
CO2: 22 mmol/L (ref 22–32)
Calcium: 8.6 mg/dL — ABNORMAL LOW (ref 8.9–10.3)
Chloride: 106 mmol/L (ref 98–111)
Creatinine, Ser: 0.56 mg/dL (ref 0.44–1.00)
GFR, Estimated: >60 mL/min (ref >60)
Glucose, Bld: 85 mg/dL (ref 70–99)
Potassium: 3.5 mmol/L (ref 3.5–5.1)
Sodium: 139 mmol/L (ref 135–145)

## 2020-11-08 MED ORDER — CLONAZEPAM 0.5 MG PO TABS
0.5000 mg | ORAL_TABLET | Freq: Two times a day (BID) | ORAL | Status: DC
Start: 2020-11-08 — End: 2020-11-11
  Administered 2020-11-08 – 2020-11-10 (×5): 0.5 mg via ORAL
  Filled 2020-11-08 (×5): qty 1

## 2020-11-08 MED ORDER — MAGNESIUM CITRATE PO SOLN
1.0000 | Freq: Every day | ORAL | Status: DC | PRN
  Administered 2020-11-08: 1 via ORAL
  Filled 2020-11-08: qty 296

## 2020-11-08 NOTE — Progress Notes (Addendum)
Family Medicine Teaching Service Daily Progress Note Intern Pager: [phone]  Patient name: Gail Blackburn Medical record number: [id number] Date of birth: 04-May-1976 Age: 45 y.o. Gender: female  Primary Care Provider: Leeroy Bock, DO Consultants: GI Code Status: Full  Pt Overview and Major Events to Date:  2/8: admitted  Assessment and Plan: Gail Blackburn a 45 y.o.femalewho presented with worsening abdominal pain and PO intolerance. PMH is significant foralcohol use disorder, bipolar disorder, migraines, chronic pain, prior opioid use (now on suboxone), fibromyalgia, anxiety/depression, tobacco use disorder.  Acute Alcoholic Pancreatitis with Early Pseudocyst Abdominal pain unchanged. CT abd showed acute pancreatitis, non occlusive splenic vein thrombosis, few small liver nodules and constipation. MRI liver showed early pseudocyst formation and very subtle area of local inflammation/injury in right hepatic lobe, thought to be clinically insignificant. -GI consulted, appreciate recommendations -Will d/c Toradol and IV fluids -Tylenol q6h scheduled, Gabapentin for pain -Consider increasing suboxone dose if pain is not well controlled -Continue Zofran and Protonix -Clear liquid diet -Avoid Narcotics  Non-Occlusive Splenic Vein Thrombosis Seen on CT/MRI abdomen. -GI consulted, appreciate recommendations -Avoiding anticoagulation due to risk of hemorrhagic conversion of pancreatitis  Normocytic Anemia Stable. Hgb 9.6 this morning. Iron panel suggestive of iron deficiency. -PO iron on discharge -Monitor CBC  Constipation Patient still has not had bowel movement (last true BM 2/4 per patient). -Continue Miralax BID -Will add Mag Citrate daily prn  Alcohol Use Disorder  Acute Alcohol Withdrawal CIWA's 11>3>3 overnight. Last drink 2/7, likely outside the window of true withdrawal. -Will d/c CIWA monitoring -Restart home Klonopin  -Continue daily thiamine,  multivitamin and folic acid.  Anxiety/Depression Patient remains very anxious. -Continue home Wellbutrin -Restart home Klonopin  Migraines -Continue home Propanolol  Chronic Pain On Suboxone, Gabapentin and Flexeril at home -Continue home meds  Tobacco Use Disorder -Nicotine patch daily  FEN/GI: Clear liquid diet PPx: Lovenox   Status is: Inpatient Remains inpatient appropriate because:Ongoing active pain requiring inpatient pain management   Dispo: The patient is from: Home              Anticipated d/c is to: Home              Anticipated d/c date is: 3 days              Patient currently is not medically stable to d/c.   Difficult to place patient No    Subjective:  No acute events overnight. Patient with ongoing abdominal pain this morning but overall it's tolerable. She still feels extremely anxious.  Objective: Temp:  [98.1 F (36.7 C)-98.5 F (36.9 C)] 98.2 F (36.8 C) (02/13 0405) Pulse Rate:  [68-86] 80 (02/13 0405) Resp:  [16-18] 16 (02/13 0405) BP: (116-146)/(85-97) 131/97 (02/13 0405) SpO2:  [98 %-100 %] 98 % (02/13 0405)  Physical Exam:  General: 45 y.o. female in NAD Cardio: RRR no m/r/g Lungs: CTAB, no wheezing, no rhonchi, no crackles, no IWOB on room air Abdomen: Soft, mid epigastric pain on light palpation, non-distended, positive bowel sounds Skin: warm and dry Extremities: No edema  Laboratory: Recent Labs  Lab 11/06/20 0300 11/07/20 0524 11/08/20 0430  WBC 5.4 6.1 5.7  HGB 8.6* 10.5* 9.6*  HCT 24.9* 31.9* 29.4*  PLT 186 292 285   Recent Labs  Lab 11/02/20 1645 11/03/20 1311 11/03/20 2100 11/06/20 0300 11/07/20 0524 11/08/20 0430  NA 133* 136   < > 136 138 139  K 4.6 4.0   < > 3.3* 3.6 3.5  CL 101  105   < > 107 103 106  CO2 21* 24   < > 22 25 22   BUN 11 10   < > <5* <5* <5*  CREATININE 0.67 0.65   < > 0.51 0.53 0.56  CALCIUM 9.1 8.1*   < > 8.1* 9.1 8.6*  PROT 6.6 5.7*  --   --   --   --   BILITOT 0.5 0.8  --   --    --   --   ALKPHOS 85 86  --   --   --   --   ALT 29 21  --   --   --   --   AST 49* 33  --   --   --   --   GLUCOSE 99 115*   < > 92 89 85   < > = values in this interval not displayed.     Imaging/Diagnostic Tests: No new imaging in past 24h.   , MD 11/08/2020, 8:04 AM PGY-1, [facility] FPTS Intern pager: [phone], text pages welcome

## 2020-11-08 NOTE — Plan of Care (Signed)
  Problem: Health Behavior/Discharge Planning: Goal: Ability to manage health-related needs will improve Outcome: Progressing   Problem: Clinical Measurements: Goal: Ability to maintain clinical measurements within normal limits will improve Outcome: Progressing Goal: Will remain free from infection Outcome: Progressing Goal: Diagnostic test results will improve Outcome: Progressing Goal: Respiratory complications will improve Outcome: Progressing Goal: Cardiovascular complication will be avoided Outcome: Progressing   Problem: Coping: Goal: Level of anxiety will decrease Outcome: Progressing   Problem: Elimination: Goal: Will not experience complications related to bowel motility Outcome: Progressing Goal: Will not experience complications related to urinary retention Outcome: Progressing   Problem: Pain Managment: Goal: General experience of comfort will improve Outcome: Progressing   Problem: Safety: Goal: Ability to remain free from injury will improve Outcome: Progressing   Problem: Skin Integrity: Goal: Risk for impaired skin integrity will decrease Outcome: Progressing   

## 2020-11-08 NOTE — Consult Note (Signed)
[facility] Consultation Note  Referring Provider: Family Medicine Service Primary Care Physician:  Leeroy Bock, DO  Reason for Consultation:  Pancreatitis, vascular thrombosis  HPI: Gail Blackburn is a 45 y.o. female admitted with several days of refractory abdominal pain.  Patient over the past one year, especially the past 3-6 months, had been under lots of stress and had been drinking progressively larger volumes of alcohol.  Found on labs and imaging to have pancreatitis; also with possible developing pseudocyst; also with vascular thrombosis.  Patient does not take narcotics (due to prior addiction) and has been taking toradol.  Her pain is slowly improving.  Over the past 5 days of her admission, only diet is clear liquids.  Prior colonoscopy with Dr. Madilyn Fireman over 10 years ago.  No hematemesis or blood in stool.  This is her first known bout of pancreatitis.   Past Medical History:  Diagnosis Date  . Anemia    PRIOR HISTORY  . Anxiety   . Asthma   . Bipolar disorder (HCC)    Borderline  . Depression   . Fibromyalgia   . Headache    MIGRAINES  . Hypothyroidism 2009   TOOK MEDS FOR FEW WEEKS NO MEDS NOW  . Migraine   . Thyroid disease    treated in past not sure if too high or too low    Past Surgical History:  Procedure Laterality Date  . COLONOSCOPY    . DILATION AND EVACUATION N/A 05/06/2016   Procedure: DILATATION AND EVACUATION;  Surgeon: Huel Cote, MD;  Location: [facility];  Service: Gynecology;  Laterality: N/A;  . FOOT SURGERY    . TEETH PULLED  03/2016  . WISDOM TOOTH EXTRACTION      Prior to Admission medications   Medication Sig Start Date End Date Taking? Authorizing Provider  aspirin-acetaminophen-caffeine (EXCEDRIN MIGRAINE) [phone] MG tablet Take 3 tablets by mouth every 6 (six) hours as needed for headache or migraine.   Yes [provider]  botulinum toxin Type A (BOTOX) 100 units SOLR injection PROVIDER TO INJECT 155 UNITS  INTRAMUSCULARLY INTO HEAD AND NECK EVERY 3 MONTHS. Patient taking differently: Inject 155 Units into the muscle See admin instructions. PROVIDER: INJECT 155 UNITS INTRAMUSCULARLY INTO THE HEAD AND NECK EVERY 3 MONTHS 12/18/19  Yes Anson Fret, MD  buPROPion [facility] SR) 150 MG 12 hr tablet Take 150 mg by mouth in the morning.   Yes [provider]  Carboxymeth-Glyc-Polysorb PF (REFRESH OPTIVE ADVANCED PF) 0.5-1-0.5 % SOLN Place 1 drop into both eyes 4 (four) times daily as needed (for dryness).   Yes [provider]  clonazePAM (KLONOPIN) 0.5 MG tablet Take 0.5 mg by mouth 2 (two) times daily.   Yes [provider]  cyclobenzaprine (FLEXERIL) 5 MG tablet Take 1 tablet (5 mg total) by mouth every 8 (eight) hours as needed for muscle spasms. 04/27/20  Yes Lomax, Amy, NP  diclofenac sodium (VOLTAREN) 1 % GEL Apply 4 g topically 4 (four) times daily. Patient taking differently: Apply 4 g topically 4 (four) times daily as needed (for pain). 06/21/18  Yes Anson Fret, MD  fluticasone (FLONASE) 50 MCG/ACT nasal spray Place 2 sprays into both nostrils daily as needed for allergies or rhinitis.   Yes [provider]  gabapentin (NEURONTIN) 800 MG tablet Take 800 mg by mouth 3 (three) times daily. 12/09/16  Yes [provider]  Galcanezumab-gnlm (EMGALITY) 120 MG/ML SOAJ Inject 120 mg into the skin every 30 (thirty) days.  04/16/20  Yes Lomax, Amy, NP  hydrOXYzine (VISTARIL) 50 MG capsule Take 50 mg by mouth 2 (two) times daily as needed for anxiety.   Yes [provider]  ibuprofen (ADVIL) 200 MG tablet Take 600 mg by mouth every 6 (six) hours as needed (for aches, migraines, or headaches).   Yes [provider]  methylPREDNISolone (MEDROL DOSEPAK) 4 MG TBPK tablet FOLLOW PACKAGE DIRECTIONS Patient taking differently: Take 4 mg by mouth See admin instructions. Take 4 mg as directed for migraines 08/02/20  Yes Anson Fret, MD  ondansetron  (ZOFRAN) 4 MG tablet TAKE 1 TABLET BY MOUTH EVERY 6 HOURS AS NEEDED FOR NAUSEA FOR VOMITING Patient taking differently: Take 4 mg by mouth every 6 (six) hours as needed for nausea or vomiting. 10/21/20  Yes Lomax, Amy, NP  pantoprazole (PROTONIX) 40 MG tablet Take 1 tablet (40 mg total) by mouth daily. 10/28/20  Yes Mullis, Kiersten P, DO  PROAIR HFA 108 (90 Base) MCG/ACT inhaler Inhale 2 puffs into the lungs every 6 (six) hours as needed for wheezing or shortness of breath.   Yes [provider]  propranolol ER (INDERAL LA) 60 MG 24 hr capsule Take 1 capsule (60 mg total) by mouth daily. 04/16/20  Yes Lomax, Amy, NP  rizatriptan (MAXALT-MLT) 10 MG disintegrating tablet 1 TABLET FOR MIGRAINE. MAY REPEAT IN 2 HOURS. Patient taking differently: Take 10 mg by mouth See admin instructions. Dissolve 10 mg in the mouth for migraine- may repeat once in 2 hours 04/16/20  Yes Lomax, Amy, NP  SUBOXONE 8-2 MG FILM Place 1 Film under the tongue in the morning and at bedtime.   Yes [provider]  valACYclovir (VALTREX) 1000 MG tablet Take 1,000 mg by mouth daily as needed (as directed for flares).   Yes [provider]  JENCYCLA 0.35 MG tablet Take 1 tablet by mouth daily.    [provider]  terbinafine (LAMISIL) 1 % cream Apply 1 application topically 2 (two) times daily. Patient not taking: Reported on 11/03/2020 05/21/19   Marthenia Rolling, DO  venlafaxine XR (EFFEXOR-XR) 150 MG 24 hr capsule Take 150 mg by mouth daily with breakfast.    [provider]  venlafaxine XR (EFFEXOR-XR) 75 MG 24 hr capsule Take 150 mg by mouth daily with breakfast.    [provider]    Current Facility-Administered Medications  Medication Dose Route Frequency Provider Last Rate Last Admin  . acetaminophen (TYLENOL) tablet 1,000 mg  1,000 mg Oral Q6H Mirian Mo, MD   1,000 mg at 11/08/20 0914  . buprenorphine-naloxone (SUBOXONE) 8-2 mg per SL tablet 1 tablet  1 tablet Sublingual BID  Mullis, Kiersten P, DO   1 tablet at 11/08/20 0919  . buPROPion (WELLBUTRIN SR) 12 hr tablet 150 mg  150 mg Oral Daily Mullis, Kiersten P, DO   150 mg at 11/08/20 0941  . clonazePAM (KLONOPIN) tablet 0.5 mg  0.5 mg Oral BID Maury Dus, MD      . cyclobenzaprine (FLEXERIL) tablet 5 mg  5 mg Oral Q8H PRN Mullis, Kiersten P, DO   5 mg at 11/07/20 2111  . enoxaparin (LOVENOX) injection 40 mg  40 mg Subcutaneous Q24H Mullis, Kiersten P, DO   40 mg at 11/08/20 1301  . folic acid (FOLVITE) tablet 1 mg  1 mg Oral Daily Mullis, Kiersten P, DO   1 mg at 11/08/20 0917  . gabapentin (NEURONTIN) capsule 800 mg  800 mg Oral BID Lilland, Alana, DO  800 mg at 11/08/20 7564   And  . gabapentin (NEURONTIN) capsule 1,200 mg  1,200 mg Oral Daily Lilland, Alana, DO   1,200 mg at 11/07/20 1652  . hydrOXYzine (ATARAX/VISTARIL) tablet 50 mg  50 mg Oral TID PRN Dana Allan, MD   50 mg at 11/07/20 2113  . influenza vac split quadrivalent PF (FLUARIX) injection 0.5 mL  0.5 mL Intramuscular Tomorrow-1000 Pray, Milus Mallick, MD      . magnesium citrate solution 1 Bottle  1 Bottle Oral Daily PRN Maury Dus, MD      . multivitamin with minerals tablet 1 tablet  1 tablet Oral Daily Mullis, Kiersten P, DO   1 tablet at 11/08/20 0917  . nicotine (NICODERM CQ - dosed in mg/24 hours) patch 21 mg  21 mg Transdermal Daily Mullis, Kiersten P, DO   21 mg at 11/08/20 0910  . ondansetron (ZOFRAN) tablet 4 mg  4 mg Oral Q6H PRN Mullis, Kiersten P, DO   4 mg at 11/05/20 1843   Or  . ondansetron (ZOFRAN) injection 4 mg  4 mg Intravenous Q6H PRN Mullis, Kiersten P, DO   4 mg at 11/04/20 3329  . pantoprazole (PROTONIX) EC tablet 40 mg  40 mg Oral Daily Mirian Mo, MD   40 mg at 11/08/20 0917  . polyethylene glycol (MIRALAX / GLYCOLAX) packet 17 g  17 g Oral BID Mirian Mo, MD   17 g at 11/08/20 0920  . propranolol ER (INDERAL LA) 24 hr capsule 60 mg  60 mg Oral Daily Mullis, Kiersten P, DO   60 mg at 11/08/20 0941  . sodium  chloride flush (NS) 0.9 % injection 10-40 mL  10-40 mL Intracatheter Q12H Billey Co, MD   10 mL at 11/08/20 0919  . sodium chloride flush (NS) 0.9 % injection 10-40 mL  10-40 mL Intracatheter PRN Billey Co, MD   10 mL at 11/08/20 0024  . thiamine tablet 100 mg  100 mg Oral Daily Mullis, Kiersten P, DO   100 mg at 11/08/20 5188   Or  . thiamine (B-1) injection 100 mg  100 mg Intravenous Daily Mullis, Kiersten P, DO   100 mg at 11/07/20 1051    Allergies as of 11/02/2020 - Review Complete 11/02/2020  Allergen Reaction Noted  . Latex Rash 05/06/2016    Family History  Problem Relation Age of Onset  . Bipolar disorder Mother   . Alcohol abuse Mother   . Hyperlipidemia Mother   . Heart disease Maternal Grandmother   . Stroke Maternal Grandmother   . Leukemia Maternal Grandmother   . Hyperlipidemia Maternal Grandmother   . Hypertension Maternal Grandmother   . Graves' disease Cousin   . Alcohol abuse Maternal Aunt   . Drug abuse Maternal Aunt   . Asthma Maternal Aunt   . COPD Maternal Aunt   . Bipolar disorder Maternal Aunt   . Hyperlipidemia Maternal Aunt     Social History   Socioeconomic History  . Marital status: Significant Other    Spouse name: Not on file  . Number of children: 2  . Years of education: College  . Highest education level: Not on file  Occupational History  . Occupation: Unemployed  Tobacco Use  . Smoking status: Current Every Day Smoker    Years: 15.00    Types: Cigarettes  . Smokeless tobacco: Never Used  . Tobacco comment: Up to 5 vape cigs per day  Vaping Use  . Vaping Use: Every day  Substance and Sexual Activity  . Alcohol use: No    Comment: Occasional, maybe 2 x/yr  . 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 Sexual activity: Yes    Birth control/protection: Injection  Other Topics Concern  . Not on file  Social History Narrative   Lives at home w/ her children   Right-handed   Caffeine: 1-2 cups per  day   Social Determinants of Health   Financial Resource Strain: Not on file  Food Insecurity: Not on file  Transportation Needs: Not on file  Physical Activity: Not on file  Stress: Not on file  Social Connections: Not on file  Intimate Partner Violence: Not on file    Review of Systems: As per HPI, all others negative  Physical Exam: Vital signs in last 24 hours: Temp:  [97.9 F (36.6 C)-98.2 F (36.8 C)] 97.9 F (36.6 C) (02/13 0800) Pulse Rate:  [68-94] 94 (02/13 0800) Resp:  [16-18] 18 (02/13 0800) BP: (104-140)/(61-97) 104/61 (02/13 0800) SpO2:  [98 %-100 %] 98 % (02/13 0800) Last BM Date:  (pt states it has been several days) General:   Alert,  Well-developed, well-nourished, pleasant and cooperative in NAD Head:  Normocephalic and atraumatic. Eyes:  Sclera clear, no icterus.   Conjunctiva pink. Ears:  Normal auditory acuity. Nose:  No deformity, discharge,  or lesions. Mouth:  No deformity or lesions.  Oropharynx pink & moist. Neck:  Supple; no masses or thyromegaly. Abdomen:  Soft, mild epigastric tenderness, no peritonitis. No masses, hepatosplenomegaly or hernias noted. Normal bowel sounds, without guarding, and without rebound.     Msk:  Symmetrical without gross deformities. Normal posture. Pulses:  Normal pulses noted. Extremities:  Without clubbing or edema. Neurologic:  Alert and  oriented x4;  grossly normal neurologically. Skin:  Intact without significant lesions or rashes. Psych:  Alert and cooperative. Tearful at times. Normal mood and affect.   Lab Results: Recent Labs    11/06/20 0300 11/07/20 0524 11/08/20 0430  WBC 5.4 6.1 5.7  HGB 8.6* 10.5* 9.6*  HCT 24.9* 31.9* 29.4*  PLT 186 292 285   BMET Recent Labs    11/06/20 0300 11/07/20 0524 11/08/20 0430  NA 136 138 139  K 3.3* 3.6 3.5  CL 107 103 106  CO2 22 25 22   GLUCOSE 92 89 85  BUN <5* <5* <5*  CREATININE 0.51 0.53 0.56  CALCIUM 8.1* 9.1 8.6*   LFT No results for input(s):  PROT, ALBUMIN, AST, ALT, ALKPHOS, BILITOT, BILIDIR, IBILI in the last 72 hours. PT/INR No results for input(s): LABPROT, INR in the last 72 hours.  Studies/Results: CT ABDOMEN PELVIS W CONTRAST  Result Date: 11/07/2020 CLINICAL DATA:  Pancreatitis, persistent. EXAM: CT ABDOMEN AND PELVIS WITH CONTRAST TECHNIQUE: Multidetector CT imaging of the abdomen and pelvis was performed using the standard protocol following bolus administration of intravenous contrast. CONTRAST:  71mL OMNIPAQUE IOHEXOL 300 MG/ML  SOLN COMPARISON:  Ultrasound abdomen 11/03/2020, CT abdomen pelvis 01/03/2009 FINDINGS: Lower chest: Bilateral trace, left greater than right, pleural effusions. Bilateral lower lobe linear atelectasis versus scarring. Hepatobiliary: Vague pericentimeter peripheral hypodensity within the right hepatic lobe (3:24). Suggestion of another vague hypodensity within the right hepatic lobe measuring approximately 2.6 cm (3:11). No gallstones, gallbladder wall thickening, or pericholecystic fluid. No biliary dilatation. Pancreas: Hazy pancreatic contour with associated peripancreatic simple free fluid as well as fat stranding. Possibly forming fluid collection inferior to the pancreatic body with however no definite pseudocyst  formation. Spleen: Normal in size without focal abnormality. Adrenals/Urinary Tract: No adrenal nodule bilaterally. Bilateral kidneys enhance symmetrically. No hydronephrosis. No hydroureter. The urinary bladder is unremarkable. Stomach/Bowel: PO contrast reaches the sigmoid colon. Stomach is within normal limits. No evidence of bowel wall thickening or dilatation. Stool throughout the colon. Appendix appears normal. Vascular/Lymphatic: The portal and superior mesenteric veins are patent. There is a nonocclusive filling defect within the splenic vein (7:77). No abdominal aorta or iliac aneurysm. No splenic artery aneurysm formation; However, limited evaluation on this portal venous phase study.  Prominent but nonenlarged retroperitoneal lymph nodes. No abdominal, pelvic, or inguinal lymphadenopathy. Reproductive: Uterus and bilateral adnexa are unremarkable. Other: Trace to small volume simple free fluid within the abdomen and pelvis. No intraperitoneal free gas. No organized fluid collection. Musculoskeletal: No acute or significant osseous findings. IMPRESSION: 1. Bilateral trace pleural effusions. 2. Acute pancreatitis with no definite pseudocyst formation/drainable organized fluid collection. Associated trace to small volume simple fluid ascites. 3. Nonocclusive splenic vein thrombosis. 4. Couple of vague, indeterminate, 1-2 cm right hepatic lobe lesions. Consider MRI liver protocol for further evaluation. 5. Constipation. Electronically Signed   By: Tish Frederickson M.D.   On: 11/07/2020 02:11   MR LIVER W WO CONTRAST  Result Date: 11/07/2020 CLINICAL DATA:  Liver lesion. Worsening abdominal pain. Recent pancreatitis and nonocclusive splenic vein thrombosis. EXAM: MRI ABDOMEN WITHOUT AND WITH CONTRAST TECHNIQUE: Multiplanar multisequence MR imaging of the abdomen was performed both before and after the administration of intravenous contrast. CONTRAST:  6.64mL GADAVIST GADOBUTROL 1 MMOL/ML IV SOLN COMPARISON:  CT abdomen 11/07/2020 FINDINGS: Despite efforts by the technologist and patient, motion artifact is present on today's exam and could not be eliminated. This reduces exam sensitivity and specificity. Lower chest: Small bilateral pleural effusions. Hepatobiliary: In 1 of the areas of concern in the right hepatic lobe about at the level of the gallbladder fossa laterally, there is a suggestion of very subtle accentuated T2 signal in the parenchyma on image 19 of series 9 and some equivocal accentuated delayed enhancement for example on image 41 of series 24, but otherwise this lesion is essentially occult on MRI. The lesion does not have worrisome characteristics and presumably could represent a  tiny focus of subcapsular fibrosis or equivocal local inflammation or injury. Of other areas of hypodensity closer to the dome of the right hepatic lobe on recent CT have no MRI lesion correlate and are likely benign/incidental. The gallbladder appears unremarkable. Common bile duct 0.5 cm in diameter, without a well-defined filling defect and with normal distal tapering appearance. Pancreas: Pancreatic and peripancreatic inflammation noted. There is suspicion for early pseudocyst formation along the anterior and inferior margin of the pancreatic body, with an irregular collection of tubular fluid signal with enhancing margins measuring about 6.2 by 1.9 by 1.3 cm, for example shown on images 46-57 of series 26. Given the enhancement of the margins, these are probably early pseudocysts rather than simply acute fluid collections. I not see compelling findings of pancreatic necrosis, and there is no gas in these lesions to further suggest pancreatic abscess. Spleen:  Unremarkable Adrenals/Urinary Tract:  Unremarkable Stomach/Bowel: Unremarkable Vascular/Lymphatic: There continues to be some localized narrowing in the left renal vein along the pancreatic tail suspicious for nonocclusive thrombus. This does not appear changed in configuration compared to the earlier CT scan. Other: Trace free fluid along the right hepatic lobe margin. Subcutaneous edema along the flanks and lateral abdominal wall musculature. Musculoskeletal: Unremarkable IMPRESSION: 1. Pancreatic and peripancreatic inflammation  with early pseudocyst formation along the anterior and inferior margin of the pancreatic body. No findings of pancreatic necrosis. 2. 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 3. In spell of the areas of concern in the right hepatic lobe about the level of the gallbladder fossa laterally, there is a suggestion of very subtle 0.8 cm focus of accentuated T2 signal  in the parenchyma and some equivocal accentuated delayed enhancement. This presumably could represent a tiny focus of subcapsular fibrosis or equivocal local inflammation or injury. The other areas of hypodensity closer to the dome of the right hepatic lobe on recent CT have no MRI characteristics and are likely benign/incidental. 4. Small bilateral pleural effusions. Subcutaneous edema along the flanks and lateral abdominal wall musculature. 5. Common bile duct 0.5 cm in diameter, without a well-defined filling defect and with normal distal tapering appearance. 6. Despite efforts by the technologist and patient, motion artifact is present on today's exam and could not be eliminated. This reduces exam sensitivity and specificity. Electronically Signed   By: Gaylyn RongWalter  Liebkemann M.D.   On: 11/07/2020 10:25   Impression:  1.  Pancreatitis, most likely alcohol-mediated. 2.  Developing pseudocyst. 3.  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 4.  Alcohol abuse. 5.  Abdominal pain. 6.  Failure to thrive. 7.  Constipation, acute on chronic.  Plan:  1.  Ambulate, mobilize as tolerated. 2.  Patient can't take narcotics; is receiving Toradol for pain, which should also be used judiciously but shouldn't impact her constipation. 3.  Treat constipation, in process by primary team. 4.  Advance to full liquids. 5.  Not in favor of anticoagulation of left renal vein thrombosis in setting of acute pancreatitis, for fear of precipitating hemorrhagic pancreatitis.  However, I have asked Family Medicine team to call Hematology consult (or at least curbside call) in AM to inquire of this also (we in GI frequently collaborate with Hematology on vascular thrombosis issues related to pancreatitis). 6.  If patient can't tolerate advance in diet over the next couple days, I have reviewed with patient the possible need for temporary nasoenteric feeding tube for nutrition. 7.  Consider repeat CT in 6-8 weeks to reassess  changes of pancreatitis, vascular thrombosis, pancreatic (pseudo)cyst. 8.  Strict alcohol abstinence reviewed with patient in detail. 9.  [facility] will follow.   LOS: 3 days   Krystena Reitter M  11/08/2020, 1:43 PM  Cell [phone] If no answer or after 5 PM call [phone]

## 2020-11-09 ENCOUNTER — Encounter (HOSPITAL_COMMUNITY): Payer: Self-pay | Admitting: Family Medicine

## 2020-11-09 DIAGNOSIS — I82890 Acute embolism and thrombosis of other specified veins: Secondary | ICD-10-CM | POA: Diagnosis not present

## 2020-11-09 DIAGNOSIS — F1020 Alcohol dependence, uncomplicated: Secondary | ICD-10-CM | POA: Diagnosis not present

## 2020-11-09 DIAGNOSIS — K5900 Constipation, unspecified: Secondary | ICD-10-CM | POA: Diagnosis not present

## 2020-11-09 DIAGNOSIS — K852 Alcohol induced acute pancreatitis without necrosis or infection: Secondary | ICD-10-CM | POA: Diagnosis not present

## 2020-11-09 LAB — BASIC METABOLIC PANEL
Anion gap: 9 (ref 5–15)
BUN: <5 mg/dL — ABNORMAL LOW (ref 6–20)
CO2: 23 mmol/L (ref 22–32)
Calcium: 9 mg/dL (ref 8.9–10.3)
Chloride: 105 mmol/L (ref 98–111)
Creatinine, Ser: 0.6 mg/dL (ref 0.44–1.00)
GFR, Estimated: >60 mL/min (ref >60)
Glucose, Bld: 94 mg/dL (ref 70–99)
Potassium: 3.5 mmol/L (ref 3.5–5.1)
Sodium: 137 mmol/L (ref 135–145)

## 2020-11-09 LAB — CBC
HCT: 29.7 % — ABNORMAL LOW (ref 36.0–46.0)
Hemoglobin: 10.2 g/dL — ABNORMAL LOW (ref 12.0–15.0)
MCH: 33 pg (ref 26.0–34.0)
MCHC: 34.3 g/dL (ref 30.0–36.0)
MCV: 96.1 fL (ref 80.0–100.0)
Platelets: 360 10*3/uL (ref 150–400)
RBC: 3.09 MIL/uL — ABNORMAL LOW (ref 3.87–5.11)
RDW: 13.3 % (ref 11.5–15.5)
WBC: 6.4 10*3/uL (ref 4.0–10.5)
nRBC: 0 % (ref 0.0–0.2)

## 2020-11-09 MED ORDER — VENLAFAXINE HCL ER 75 MG PO CP24
75.0000 mg | ORAL_CAPSULE | Freq: Every day | ORAL | Status: DC
  Administered 2020-11-09 – 2020-11-10 (×2): 75 mg via ORAL
  Filled 2020-11-09 (×2): qty 1

## 2020-11-09 NOTE — Consult Note (Addendum)
Gail Blackburn    INITIAL HEMATOLOGY CONSULTATION  Referring MD:  Dr. 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  The patient presented to the emergency room with generalized abdominal pain.  Pain was initially in the epigastric area and getting progressively worse.  She was initially seen as an outpatient but presented to the emergency room due to worsening pain with nausea and vomiting.  Lipase went from 1 22-9 56 and right upper quadrant ultrasound showed acute pancreatitis.  A CT of the abdomen/pelvis showed acute pancreatitis with no definite pseudocyst formation/drainable organized fluid collection, nonocclusive splenic vein thrombosis, a couple of vague, indeterminate 1 to 2 cm right hepatic lobe lesions.  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  The patient has been seen by GI and they recommend against anticoagulation due to risk for hemorrhagic pancreatitis.  However, they wanted hematology evaluation for final recommendations.  The patient tells me that she is feeling much better today.  She still has some abdominal discomfort particularly in her epigastric area and over the left upper quadrant.  She noticed some increased pain after eating this morning.  She is not having any fevers or chills currently.  She reports that she has frequent headaches.  She also has some chest discomfort and shortness of breath  prior to admission.  Nausea and vomiting have improved.  She has been experiencing constipation and took magnesium citrate today which has been effective.  No bleeding reported.  She denies any personal history of DVT or PE. She reports that her maternal grandmother had a blood clot, but she does not know the definitive details surrounding this.  She thinks that the blood clot may have been following a medical issue.  Hematology was asked see the patient to make recommendations regarding her nonocclusive splenic thrombus.    Past Medical History:  Diagnosis Date  . Anemia    PRIOR HISTORY  . Anxiety   . Asthma   . Bipolar disorder (HCC)    Borderline  . Depression   . Fibromyalgia   . Headache    MIGRAINES  . Hypothyroidism 2009   TOOK MEDS FOR FEW WEEKS NO MEDS NOW  . Migraine   . Thyroid disease    treated in past not sure if too high or too low  :    Past Surgical History:  Procedure Laterality Date  . COLONOSCOPY    . DILATION AND EVACUATION N/A 05/06/2016   Procedure: DILATATION AND EVACUATION;  Surgeon: Huel Cote, MD;  Location: [facility];  Service: Gynecology;  Laterality: N/A;  . FOOT SURGERY    . TEETH PULLED  03/2016  . WISDOM TOOTH EXTRACTION    :   CURRENT MEDS: Current Facility-Administered Medications  Medication Dose Route Frequency Provider Last Rate Last Admin  . acetaminophen (TYLENOL) tablet 1,000 mg  1,000 mg Oral Q6H Mirian Mo, MD   1,000 mg at 11/09/20 0955  . buprenorphine-naloxone (SUBOXONE) 8-2 mg per SL tablet 1 tablet  1 tablet Sublingual BID  Mullis, Kiersten P, DO   1 tablet at 11/09/20 1660  . buPROPion (WELLBUTRIN SR) 12 hr tablet 150 mg  150 mg Oral Daily Mullis, Kiersten P, DO   150 mg at 11/09/20 0956  . clonazePAM (KLONOPIN) tablet 0.5 mg  0.5 mg Oral BID Maury Dus, MD   0.5 mg at 11/09/20 0956  . cyclobenzaprine (FLEXERIL) tablet 5 mg  5 mg Oral Q8H PRN Mullis, Kiersten P, DO   5 mg at 11/08/20 2035  . enoxaparin (LOVENOX)  injection 40 mg  40 mg Subcutaneous Q24H Mullis, Kiersten P, DO   40 mg at 11/08/20 1301  . folic acid (FOLVITE) tablet 1 mg  1 mg Oral Daily Mullis, Kiersten P, DO   1 mg at 11/09/20 0955  . gabapentin (NEURONTIN) capsule 800 mg  800 mg Oral BID Lilland, Alana, DO   800 mg at 11/08/20 2140   And  . gabapentin (NEURONTIN) capsule 1,200 mg  1,200 mg Oral Daily Lilland, Alana, DO   1,200 mg at 11/08/20 1700  . hydrOXYzine (ATARAX/VISTARIL) tablet 50 mg  50 mg Oral TID PRN Dana Allan, MD   50 mg at 11/08/20 1545  . influenza vac split quadrivalent PF (FLUARIX) injection 0.5 mL  0.5 mL Intramuscular Tomorrow-1000 Billey Co, MD      . magnesium citrate solution 1 Bottle  1 Bottle Oral Daily PRN Maury Dus, MD   1 Bottle at 11/08/20 1600  . multivitamin with minerals tablet 1 tablet  1 tablet Oral Daily Mullis, Kiersten P, DO   1 tablet at 11/09/20 0955  . nicotine (NICODERM CQ - dosed in mg/24 hours) patch 21 mg  21 mg Transdermal Daily Mullis, Kiersten P, DO   21 mg at 11/08/20 0910  . ondansetron (ZOFRAN) tablet 4 mg  4 mg Oral Q6H PRN Mullis, Kiersten P, DO   4 mg at 11/05/20 1843   Or  . ondansetron (ZOFRAN) injection 4 mg  4 mg Intravenous Q6H PRN Mullis, Kiersten P, DO   4 mg at 11/04/20 6004  . pantoprazole (PROTONIX) EC tablet 40 mg  40 mg Oral Daily Mirian Mo, MD   40 mg at 11/09/20 0956  . polyethylene glycol (MIRALAX / GLYCOLAX) packet 17 g  17 g Oral BID Mirian Mo, MD   17 g at 11/08/20 2139  . propranolol ER (INDERAL LA) 24 hr capsule 60 mg  60 mg Oral Daily Mullis, Kiersten P, DO   60 mg at 11/09/20 0956  . sodium chloride flush (NS) 0.9 % injection 10-40 mL  10-40 mL Intracatheter Q12H Billey Co, MD   10 mL at 11/08/20 2142  . sodium chloride flush (NS) 0.9 % injection 10-40 mL  10-40 mL Intracatheter PRN Billey Co, MD   10 mL at 11/08/20 0024  . thiamine tablet 100 mg  100 mg Oral Daily Mullis, Kiersten P, DO   100 mg at 11/09/20 5997   Or  . thiamine  (B-1) injection 100 mg  100 mg Intravenous Daily Mullis, Kiersten P, DO   100 mg at 11/07/20 1051      Allergies  Allergen Reactions  . Latex Rash and Other (See Comments)    Only in vaginal area  . Tape Rash and Other (See Comments)    Rashes occur only if left on for a long time  :  Family History  Problem Relation Age of Onset  . Bipolar disorder Mother   . Alcohol abuse Mother   .  Hyperlipidemia Mother   . Heart disease Maternal Grandmother   . Stroke Maternal Grandmother   . Leukemia Maternal Grandmother   . Hyperlipidemia Maternal Grandmother   . Hypertension Maternal Grandmother   . Graves' disease Cousin   . Alcohol abuse Maternal Aunt   . Drug abuse Maternal Aunt   . Asthma Maternal Aunt   . COPD Maternal Aunt   . Bipolar disorder Maternal Aunt   . Hyperlipidemia Maternal Aunt   :  Social History   Socioeconomic History  . Marital status: Significant Other    Spouse name: Not on file  . Number of children: 2  . Years of education: College  . Highest education level: Not on file  Occupational History  . Occupation: Unemployed  Tobacco Use  . Smoking status: Current Every Day Smoker    Years: 15.00    Types: Cigarettes  . Smokeless tobacco: Never Used  . Tobacco comment: Up to 5 vape cigs per day  Vaping Use  . Vaping Use: Every day  Substance and Sexual Activity  . Alcohol use: No    Comment: Occasional, maybe 2 x/yr  . 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 Sexual activity: Yes    Birth control/protection: Injection  Other Topics Concern  . Not on file  Social History Narrative   Lives at home w/ her children   Right-handed   Caffeine: 1-2 cups per day   Social Determinants of Health   Financial Resource Strain: Not on file  Food Insecurity: Not on file  Transportation Needs: Not on file  Physical Activity: Not on file  Stress: Not on file  Social Connections: Not on file  Intimate Partner Violence: Not on file   :  REVIEW OF SYSTEMS:  A comprehensive 14 point review of systems was negative except as noted in the HPI.    Exam: Patient Vitals for the past 24 hrs:  BP Temp Temp src Pulse Resp SpO2  11/09/20 0832 94/64 97.9 F (36.6 C) Oral 89 18 100 %  11/09/20 0435 116/73 98.3 F (36.8 C) Oral 67 18 100 %  11/08/20 2338 (!) 130/95 98.2 F (36.8 C) Oral 81 20 100 %  11/08/20 2020 (!) 111/93 97.8 F (36.6 C) Oral 67 20 99 %    General:  well-nourished in no acute distress.   Eyes:  no scleral icterus.   ENT:  There were no oropharyngeal lesions.   Neck was without thyromegaly.   Lymphatics:  Negative cervical, supraclavicular or axillary adenopathy.   Respiratory: lungs were clear bilaterally without wheezing or crackles.   Cardiovascular:  Regular rate and rhythm, S1/S2, without murmur, rub or gallop.  There was no pedal edema.   GI: Positive bowel sounds, soft, tenderness with palpation over the epigastric and left upper quadrant.  No rebound or guarding. Musculoskeletal:  no spinal tenderness of palpation of vertebral spine.   Skin exam was without ecchymosis, petechiae.   Neuro exam was nonfocal.  Patient was alert and oriented.  Attention was good. Language was appropriate.  Mood was normal without depression.  Speech was not pressured.  Thought content was not tangential.    LABS:  Lab Results  Component Value Date   WBC 6.4 11/09/2020   HGB 10.2 (L) 11/09/2020   HCT 29.7 (L) 11/09/2020   PLT 360 11/09/2020   GLUCOSE 94 11/09/2020   CHOL 133 11/03/2020   TRIG 86 11/03/2020   HDL 53 11/03/2020  LDLCALC 63 11/03/2020   ALT 21 11/03/2020   AST 33 11/03/2020   NA 137 11/09/2020   K 3.5 11/09/2020   CL 105 11/09/2020   CREATININE 0.60 11/09/2020   BUN <5 (L) 11/09/2020   CO2 23 11/09/2020   INR 1.0 11/04/2020   HGBA1C 5.1 11/04/2020    CT ABDOMEN PELVIS W CONTRAST  Result Date: 11/07/2020 CLINICAL DATA:  Pancreatitis, persistent. EXAM: CT ABDOMEN AND PELVIS WITH  CONTRAST TECHNIQUE: Multidetector CT imaging of the abdomen and pelvis was performed using the standard protocol following bolus administration of intravenous contrast. CONTRAST:  80mL OMNIPAQUE IOHEXOL 300 MG/ML  SOLN COMPARISON:  Ultrasound abdomen 11/03/2020, CT abdomen pelvis 01/03/2009 FINDINGS: Lower chest: Bilateral trace, left greater than right, pleural effusions. Bilateral lower lobe linear atelectasis versus scarring. Hepatobiliary: Vague pericentimeter peripheral hypodensity within the right hepatic lobe (3:24). Suggestion of another vague hypodensity within the right hepatic lobe measuring approximately 2.6 cm (3:11). No gallstones, gallbladder wall thickening, or pericholecystic fluid. No biliary dilatation. Pancreas: Hazy pancreatic contour with associated peripancreatic simple free fluid as well as fat stranding. Possibly forming fluid collection inferior to the pancreatic body with however no definite pseudocyst formation. Spleen: Normal in size without focal abnormality. Adrenals/Urinary Tract: No adrenal nodule bilaterally. Bilateral kidneys enhance symmetrically. No hydronephrosis. No hydroureter. The urinary bladder is unremarkable. Stomach/Bowel: PO contrast reaches the sigmoid colon. Stomach is within normal limits. No evidence of bowel wall thickening or dilatation. Stool throughout the colon. Appendix appears normal. Vascular/Lymphatic: The portal and superior mesenteric veins are patent. There is a nonocclusive filling defect within the splenic vein (7:77). No abdominal aorta or iliac aneurysm. No splenic artery aneurysm formation; However, limited evaluation on this portal venous phase study. Prominent but nonenlarged retroperitoneal lymph nodes. No abdominal, pelvic, or inguinal lymphadenopathy. Reproductive: Uterus and bilateral adnexa are unremarkable. Other: Trace to small volume simple free fluid within the abdomen and pelvis. No intraperitoneal free gas. No organized fluid  collection. Musculoskeletal: No acute or significant osseous findings. IMPRESSION: 1. Bilateral trace pleural effusions. 2. Acute pancreatitis with no definite pseudocyst formation/drainable organized fluid collection. Associated trace to small volume simple fluid ascites. 3. Nonocclusive splenic vein thrombosis. 4. Couple of vague, indeterminate, 1-2 cm right hepatic lobe lesions. Consider MRI liver protocol for further evaluation. 5. Constipation. Electronically Signed   By: Tish Frederickson M.D.   On: 11/07/2020 02:11   MR LIVER W WO CONTRAST  Addendum Date: 11/09/2020   ADDENDUM REPORT: 11/09/2020 08:43 ADDENDUM: The original report was by Dr. Gaylyn Rong. The following addendum is by Dr. Gaylyn Rong: Correction to the original report, the small amount of nonocclusive thrombus is in the SPLENIC vein, not the left renal vein. This small amount of nonocclusive thrombus does not appear appreciably changed compared to the CT from 11/07/2020. This was communicated with the Family Medicine team at 8:40 a.m. on 11/09/2020. Electronically Signed   By: Gaylyn Rong M.D.   On: 11/09/2020 08:43   Result Date: 11/09/2020 CLINICAL DATA:  Liver lesion. Worsening abdominal pain. Recent pancreatitis and nonocclusive splenic vein thrombosis. EXAM: MRI ABDOMEN WITHOUT AND WITH CONTRAST TECHNIQUE: Multiplanar multisequence MR imaging of the abdomen was performed both before and after the administration of intravenous contrast. CONTRAST:  6.50mL GADAVIST GADOBUTROL 1 MMOL/ML IV SOLN COMPARISON:  CT abdomen 11/07/2020 FINDINGS: Despite efforts by the technologist and patient, motion artifact is present on today's exam and could not be eliminated. This reduces exam sensitivity and specificity. Lower chest: Small bilateral pleural effusions. Hepatobiliary:  In 1 of the areas of concern in the right hepatic lobe about at the level of the gallbladder fossa laterally, there is a suggestion of very subtle accentuated  T2 signal in the parenchyma on image 19 of series 9 and some equivocal accentuated delayed enhancement for example on image 41 of series 24, but otherwise this lesion is essentially occult on MRI. The lesion does not have worrisome characteristics and presumably could represent a tiny focus of subcapsular fibrosis or equivocal local inflammation or injury. Of other areas of hypodensity closer to the dome of the right hepatic lobe on recent CT have no MRI lesion correlate and are likely benign/incidental. The gallbladder appears unremarkable. Common bile duct 0.5 cm in diameter, without a well-defined filling defect and with normal distal tapering appearance. Pancreas: Pancreatic and peripancreatic inflammation noted. There is suspicion for early pseudocyst formation along the anterior and inferior margin of the pancreatic body, with an irregular collection of tubular fluid signal with enhancing margins measuring about 6.2 by 1.9 by 1.3 cm, for example shown on images 46-57 of series 26. Given the enhancement of the margins, these are probably early pseudocysts rather than simply acute fluid collections. I not see compelling findings of pancreatic necrosis, and there is no gas in these lesions to further suggest pancreatic abscess. Spleen:  Unremarkable Adrenals/Urinary Tract:  Unremarkable Stomach/Bowel: Unremarkable Vascular/Lymphatic: There continues to be some localized narrowing in the left renal vein along the pancreatic tail suspicious for nonocclusive thrombus. This does not appear changed in configuration compared to the earlier CT scan. Other: Trace free fluid along the right hepatic lobe margin. Subcutaneous edema along the flanks and lateral abdominal wall musculature. Musculoskeletal: Unremarkable IMPRESSION: 1. Pancreatic and peripancreatic inflammation with early pseudocyst formation along the anterior and inferior margin of the pancreatic body. No findings of pancreatic necrosis. 2. Stable  nonocclusive thrombus in the left renal vein along the pancreatic tail, similar to the earlier CT scan. 3. In spell of the areas of concern in the right hepatic lobe about the level of the gallbladder fossa laterally, there is a suggestion of very subtle 0.8 cm focus of accentuated T2 signal in the parenchyma and some equivocal accentuated delayed enhancement. This presumably could represent a tiny focus of subcapsular fibrosis or equivocal local inflammation or injury. The other areas of hypodensity closer to the dome of the right hepatic lobe on recent CT have no MRI characteristics and are likely benign/incidental. 4. Small bilateral pleural effusions. Subcutaneous edema along the flanks and lateral abdominal wall musculature. 5. Common bile duct 0.5 cm in diameter, without a well-defined filling defect and with normal distal tapering appearance. 6. Despite efforts by the technologist and patient, motion artifact is present on today's exam and could not be eliminated. This reduces exam sensitivity and specificity. Electronically Signed: By: Gaylyn Rong M.D. On: 11/07/2020 10:25   US Abdomen Limited RUQ (LIVER/GB)  Result Date: 11/03/2020 CLINICAL DATA:  Abdominal pain EXAM: ULTRASOUND ABDOMEN LIMITED RIGHT UPPER QUADRANT COMPARISON:  January 03, 2009 abdominal ultrasound. FINDINGS: Gallbladder: No gallstones or wall thickening visualized. There is no pericholecystic fluid. No sonographic Murphy sign noted by sonographer. Common bile duct: Diameter: 4 mm. No intrahepatic or extrahepatic biliary duct dilatation. Liver: No focal lesion identified. Within normal limits in parenchymal echogenicity. Portal vein is patent on color Doppler imaging with normal direction of blood flow towards the liver. Other: Visualized portions of pancreas appear rather prominent with pancreatic duct measuring 4 mm. Slight ascites. IMPRESSION: 1. Prominence in  visualized portions of pancreas with prominence of the pancreatic  duct. These are findings potentially indicating a degree of acute pancreatitis. It may be prudent to consider correlation with contrast enhanced CT or MR of the pancreas to further evaluate. 2.  Slight ascites. 3.  Study otherwise unremarkable. Electronically Signed   By: Bretta BangWilliam  Woodruff III M.D.   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  She does not seem to have a strong family history of blood clots and no personal family history of clots. -We discussed that normally recommendations would be to place her on anticoagulation for at least 3 months.  However, given her acute pancreatitis, GI has raised the concern for hemorrhagic pancreatitis.  The patient will be seen by Dr. Candise CheKale for further recommendations for anticoagulation. -She was counseled about cessation of alcohol and tobacco.  Thank you for this referral.  Clenton PareKristin Curcio, DNP, AGPCNP-BC, AOCNP Mon/Tues/Thurs/Fri 7am-5pm; Off Wednesdays Cell: [phone]   ADDENDUM  .Patient was Personally and independently interviewed, examined and relevant elements of the history of present illness were reviewed in details and an assessment and plan was created. All elements of the patient's history of present illness , assessment and plan were discussed in details with Clenton PareKristin Curcio, DNP, AGPCNP-BC, AOCNP. The above documentation reflects our combined findings assessment and plan.  Patient has no history of venous thromboembolism and is noted to have short segment distal splenic vein nonocclusive thrombus of uncertain acuity.  This seems to be likely to be from her recurrent pancreatitis and be present in about  12% of people with recurrent pancreatitis.  This likely represents an incidental finding not the cause of her abdominal pain. This splenic vein thrombosis does not seem to be causing symptoms its nonocclusive nature makes the likelihood of left-sided portal hypertension and gastric varices as a complication much less likely.  The risk of this limited thrombus progressing and extending into the mesenteric or portal venous system is relatively low at this time and as per GI input the risk of hemorrhagic transformation is notable.  In this current context reviewing all the imaging studies and GI recommendation our recommendations would be  -Would hold off on therapeutic anticoagulation. -Prophylactic anticoagulation as per hospital medicine team. -Addressing and avoiding recurrent pancreatitis -Reasonable to recheck CT or ultrasound with GI or primary care in about 8 weeks to rule out clot progression.  If there is significant splenic vein clot progression and increased risk of clot extension into SMV or PV there might be a role for anticoagulation once the pancreatitis settles down. -reasonable to check FVL and prothrombin gene mutation studies but not necessarily pursue extensive hypercoagulable workup given patient has overt provoking etiology for her small splenic vein thrombosis.  Wyvonnia LoraGautam Ezekiel Menzer MD MS

## 2020-11-09 NOTE — Progress Notes (Signed)
Patient states that her abdominal pain is much better this morning.  She has had limited clear liquids, then had grits this morning with some mild upper abdominal discomfort in the left perigastric area.  On exam, the patient is lying in bed in absolutely no distress whatsoever, seems very chipper, good spirits.  Abdomen has very active, normal character bowel sounds, and no significant upper abdominal tenderness to light palpation.    Corrected radiologic impression, of splenic vein thrombosis, noted.  Recommendations:  1.  Given absence of involvement of mesenteric vessels, I would not favor anticoagulation.  2.  Lipase has not been checked recently; will order for tomorrow.  3.  Continue observation on full liquid diet.  Consider advancement tomorrow if it is tolerated.  Florencia Reasons, M.D. Pager [phone] If no answer or after 5 PM call [phone]

## 2020-11-09 NOTE — Progress Notes (Addendum)
Family Medicine Teaching Service Daily Progress Note Intern Pager: [phone]  Patient name: Gail Blackburn Medical record number: [id number] Date of birth: 1976/02/03 Age: 45 y.o. Gender: female  Primary Care Provider: Leeroy Bock, DO Consultants: GI Code Status: Full  Pt Overview and Major Events to Date:  2/8: admitted with pancreatitis 2/12: CT/MRI imaging shows early pseudocyst and non-occlusive splenic vein thrombosis  Assessment and Plan: Gail Blackburn a 45 y.o.femalewho presented with worsening abdominal pain and PO intolerance. PMH is significant foralcohol use disorder, bipolar disorder, migraines, chronic pain, prior opioid use (now on suboxone), fibromyalgia, anxiety/depression, tobacco use disorder.  Acute Alcoholic Pancreatitis with Early Pseudocyst  Non-Occlusive Splenic Vein Thrombosis Pain is improved this morning. CT abd/pelvis and MRI liver showed pancreatitis with early pseudocyst formation and non-occlusive splenic vein thrombosis. -GI following, appreciate recommendations -Will reach out to heme-onc for recommendations re: splenic vein thrombosis  -Not anticoagulating currently due to risk of hemorrhagic conversion of pancreatitis -Continue Tylenol q6h scheduled, Gabapentin for pain -Continue Zofran and Protonix -Full liquid diet, advance as tolerated -Recheck lipase tomorrow am -Avoid Narcotics  Constipation- resolving Patient reports several bowel movements overnight. No abdominal distention on exam this morning. -Continue Miralax BID and Mag Citrate daily prn  Alcohol Use Disorder Last drink 2/7, likely outside the window of withdrawal at this point. -Continue daily thiamine, multivitamin and folic acid  Normocytic Anemia Stable. Hgb 10.2 this morning. Iron panel was suggestive of iron deficiency. -PO iron on discharge  Anxiety/Depression Patient still with a lot of anxiety, particularly about being in the hospital -Continue home  Wellbutrin -Home Klonopin restarted yesterday, 2/13 -Atarax TID prn  Migraines -Continue home Propanolol  Chronic Pain On Suboxone, Gabapentin and Flexeril at home -Continue home meds  Tobacco Use Disorder -Nicotine patch daily  FEN/GI: Full liquid diet, advance as tolerated PPx: Lovenox   Status is: Inpatient Remains inpatient appropriate because:Ongoing active pain requiring inpatient pain management   Dispo: The patient is from: Home              Anticipated d/c is to: Home              Anticipated d/c date is: 2 days              Patient currently is not medically stable to d/c.   Difficult to place patient No    Subjective:  No acute events overnight. Patient states her pain is improved this morning. She had multiple bowel movements overnight. Still feeling anxious but it's manageable.   Objective: Temp:  [97.8 F (36.6 C)-98.3 F (36.8 C)] 97.9 F (36.6 C) (02/14 0832) Pulse Rate:  [67-89] 89 (02/14 0832) Resp:  [18-20] 18 (02/14 0832) BP: (94-130)/(64-95) 94/64 (02/14 0832) SpO2:  [99 %-100 %] 100 % (02/14 4034)  Physical Exam:  General: 45 y.o. female in NAD Cardio: RRR no m/r/g Lungs: CTAB, no wheezing, no rhonchi, no crackles, no IWOB on room air Abdomen: +BS, soft, nontender (minimal LUQ tenderness with deep palpation), nondistended Skin: warm and dry Extremities: No edema  Laboratory: Recent Labs  Lab 11/07/20 0524 11/08/20 0430 11/09/20 0430  WBC 6.1 5.7 6.4  HGB 10.5* 9.6* 10.2*  HCT 31.9* 29.4* 29.7*  PLT 292 285 360   Recent Labs  Lab 11/02/20 1645 11/03/20 1311 11/03/20 2100 11/07/20 0524 11/08/20 0430 11/09/20 0430  NA 133* 136   < > 138 139 137  K 4.6 4.0   < > 3.6 3.5 3.5  CL 101 105   < >  103 106 105  CO2 21* 24   < > 25 22 23   BUN 11 10   < > <5* <5* <5*  CREATININE 0.67 0.65   < > 0.53 0.56 0.60  CALCIUM 9.1 8.1*   < > 9.1 8.6* 9.0  PROT 6.6 5.7*  --   --   --   --   BILITOT 0.5 0.8  --   --   --   --   ALKPHOS  85 86  --   --   --   --   ALT 29 21  --   --   --   --   AST 49* 33  --   --   --   --   GLUCOSE 99 115*   < > 89 85 94   < > = values in this interval not displayed.     Imaging/Diagnostic Tests: No new imaging in past 24h.   , MD 11/09/2020, 9:31 AM PGY-1, [facility] FPTS Intern pager: [phone], text pages welcome

## 2020-11-09 NOTE — Progress Notes (Signed)
Dr. Ova Freshwater with radiology was contacted this morning to help clarify the thrombus noted on the MRI and CT from 2/12.  He reviewed the images and noted that there was a splenic vein thrombus.  No renal vein thrombus was evident.  He noted that he would make the appropriate changes to the documentation.  Mirian Mo, MD

## 2020-11-10 DIAGNOSIS — K852 Alcohol induced acute pancreatitis without necrosis or infection: Secondary | ICD-10-CM | POA: Diagnosis not present

## 2020-11-10 DIAGNOSIS — K5900 Constipation, unspecified: Secondary | ICD-10-CM | POA: Diagnosis not present

## 2020-11-10 DIAGNOSIS — F1020 Alcohol dependence, uncomplicated: Secondary | ICD-10-CM | POA: Diagnosis not present

## 2020-11-10 LAB — BASIC METABOLIC PANEL
Anion gap: 9 (ref 5–15)
BUN: <5 mg/dL — ABNORMAL LOW (ref 6–20)
CO2: 22 mmol/L (ref 22–32)
Calcium: 9.3 mg/dL (ref 8.9–10.3)
Chloride: 106 mmol/L (ref 98–111)
Creatinine, Ser: 0.61 mg/dL (ref 0.44–1.00)
GFR, Estimated: >60 mL/min (ref >60)
Glucose, Bld: 101 mg/dL — ABNORMAL HIGH (ref 70–99)
Potassium: 3.6 mmol/L (ref 3.5–5.1)
Sodium: 137 mmol/L (ref 135–145)

## 2020-11-10 LAB — CBC
HCT: 33.1 % — ABNORMAL LOW (ref 36.0–46.0)
Hemoglobin: 11 g/dL — ABNORMAL LOW (ref 12.0–15.0)
MCH: 32.6 pg (ref 26.0–34.0)
MCHC: 33.2 g/dL (ref 30.0–36.0)
MCV: 98.2 fL (ref 80.0–100.0)
Platelets: 425 10*3/uL — ABNORMAL HIGH (ref 150–400)
RBC: 3.37 MIL/uL — ABNORMAL LOW (ref 3.87–5.11)
RDW: 13.4 % (ref 11.5–15.5)
WBC: 6.3 10*3/uL (ref 4.0–10.5)
nRBC: 0 % (ref 0.0–0.2)

## 2020-11-10 LAB — LIPASE, BLOOD: Lipase: 89 U/L — ABNORMAL HIGH (ref 11–51)

## 2020-11-10 MED ORDER — CLONAZEPAM 0.5 MG PO TABS: 0.5000 mg | ORAL_TABLET | Freq: Every day | ORAL | 0 refills | Status: DC

## 2020-11-10 MED ORDER — FERROUS SULFATE 325 (65 FE) MG PO TABS: 325.0000 mg | ORAL_TABLET | Freq: Every day | ORAL | 0 refills | Status: DC

## 2020-11-10 MED ORDER — ACETAMINOPHEN 500 MG PO TABS: 1000.0000 mg | ORAL_TABLET | Freq: Four times a day (QID) | ORAL | 0 refills | Status: DC

## 2020-11-10 MED ORDER — THIAMINE HCL 100 MG PO TABS: 100.0000 mg | ORAL_TABLET | Freq: Every day | ORAL | 0 refills | Status: DC

## 2020-11-10 MED ORDER — CYCLOBENZAPRINE HCL 5 MG PO TABS: 5.0000 mg | ORAL_TABLET | Freq: Three times a day (TID) | ORAL | 0 refills | Status: DC | PRN

## 2020-11-10 MED ORDER — FOLIC ACID 1 MG PO TABS: 1.0000 mg | ORAL_TABLET | Freq: Every day | ORAL | 0 refills | Status: DC

## 2020-11-10 MED ORDER — VENLAFAXINE HCL ER 75 MG PO CP24: 75.0000 mg | ORAL_CAPSULE | Freq: Every day | ORAL | 0 refills | Status: AC

## 2020-11-10 NOTE — Discharge Summary (Addendum)
Family Medicine Teaching [facility] Discharge Summary  Patient name: Gail Blackburn Medical record number: [id number] Date of birth: [birth year] Age: 45 y.o. Gender: female Date of Admission: 11/02/2020  Date of Discharge: 11/10/2020 Admitting Physician: Billey Co, MD  Primary Care Provider: Leeroy Bock, DO Consultants: GI, heme-onc  Indication for Hospitalization: Acute Alcoholic Pancreatitis  Discharge Diagnoses/Problem List:  Acute alcoholic pancreatitis with early pseudocyst Non-occlusive splenic vein thrombosis Constipation Alcohol use disorder Iron deficiency anemia Chronic pain Opioid use disorder, on Suboxone Anxiety, depression Tobacco use Migraines  Disposition: Home  Discharge Condition: Improved, stable  Discharge Exam:  General: 45 y.o. female in NAD Cardio: RRR no m/r/g Lungs: CTAB, no wheezing, no rhonchi, no crackles, no IWOB on room air Abdomen: +BS, soft, nontender, nondistended Skin: warm and dry Extremities: No edema  Brief Hospital Course:  Gail Blackburn a 45 y.o.femalewho presented with worsening abdominal pain and PO intolerance. PMH is significant foralcohol use disorder, bipolar disorder, migraines, chronic pain, prior opioid use (now on suboxone), fibromyalgia, anxiety/depression, tobacco use disorder.  Acute alcoholic pancreatitis with early pseudocyst  nonocclusive splenic vein thrombosis Patient presented with abdominal pain and PO intolerance. Lipase was elevated to 956 and RUQ ultrasound showed pancreatitis, but no evidence of biliary stones. She was treated with Toradol x5 days followed by scheduled Tylenol, Zofran, Gabapentin and Protonix for pain. Her diet was advanced as tolerated. Additional imaging (CT/MRI liver) on 2/12 revealed early pseudocyst formation and nonocclusive splenic vein thrombosis. GI and hematology were consulted, and did not recommend anticoagulation. On morning of discharge patient's lipase had  downtrended to 89. She was counseled on alcohol cessation.  Iron deficiency anemia Patient's baseline hemoglobin is approximately 10.  Iron studies revealed saturation ratio of 8, consistent with iron deficiency anemia.  She was started on oral iron supplementation at discharge.  Alcohol use disorder Patient has a history of alcohol use disorder, drinking 375 mL of vodka daily prior to admission.  She was monitored with CIWA's and treated with Ativan until she was outside the withdrawal period. She was counseled on alcohol cessation and social work provided outpatient cessation resources.  Issues for Follow Up:  1. Recommend repeat CT abdomen in 6-8 weeks to ensure there is no progression of splenic vein thrombosis. Anticoagulation may be warranted if there is progression of the thrombus. 2. Ensure complete abstinence from alcohol 3. Discuss frequency of Klonopin use (prescribed as 0.5mg  once daily), would not recommend additional use 4. Of note, patient was restarted on her home venlafaxine (had previously self weaned off)  Significant Procedures: None  Significant Labs and Imaging:  Recent Labs  Lab 11/08/20 0430 11/09/20 0430 11/10/20 0510  WBC 5.7 6.4 6.3  HGB 9.6* 10.2* 11.0*  HCT 29.4* 29.7* 33.1*  PLT 285 360 425*   Recent Labs  Lab 11/03/20 1311 11/03/20 2100 11/05/20 0332 11/06/20 0300 11/07/20 0524 11/08/20 0430 11/09/20 0430 11/10/20 0510  NA 136   < > 135 136 138 139 137 137  K 4.0   < > 3.1* 3.3* 3.6 3.5 3.5 3.6  CL 105   < > 104 107 103 106 105 106  CO2 24   < > 23 22 25 22 23 22   GLUCOSE 115*   < > 86 92 89 85 94 101*  BUN 10   < > <5* <5* <5* <5* <5* <5*  CREATININE 0.65   < > 0.64 0.51 0.53 0.56 0.60 0.61  CALCIUM 8.1*   < > 7.7* 8.1* 9.1 8.6*  9.0 9.3  MG 1.9  --  1.8 1.8 2.0  --   --   --   PHOS 2.3*  --  1.7* 2.8 5.0*  --   --   --   ALKPHOS 86  --   --   --   --   --   --   --   AST 33  --   --   --   --   --   --   --   ALT 21  --   --   --   --    --   --   --   ALBUMIN 3.3*  --   --   --   --   --   --   --    < > = values in this interval not displayed.   Lipase 956>>89  Results/Tests Pending at Time of Discharge: None  Discharge Medications:  Allergies as of 11/10/2020      Reactions   Latex Rash, Other (See Comments)   Only in vaginal area   Tape Rash, Other (See Comments)   Rashes occur only if left on for a long time      Medication List    STOP taking these medications   aspirin-acetaminophen-caffeine 250-250-65 MG tablet Commonly known as: EXCEDRIN MIGRAINE   methylPREDNISolone 4 MG Tbpk tablet Commonly known as: MEDROL DOSEPAK     TAKE these medications   acetaminophen 500 MG tablet Commonly known as: TYLENOL Take 2 tablets (1,000 mg total) by mouth every 6 (six) hours.   Botox 100 units Solr injection Generic drug: botulinum toxin Type A PROVIDER TO INJECT 155 UNITS INTRAMUSCULARLY INTO HEAD AND NECK EVERY 3 MONTHS. What changed:   how much to take  how to take this  when to take this  additional instructions   buPROPion 150 MG 12 hr tablet Commonly known as: WELLBUTRIN SR Take 150 mg by mouth in the morning.   clonazePAM 0.5 MG tablet Commonly known as: KLONOPIN Take 1 tablet (0.5 mg total) by mouth daily. What changed: when to take this   cyclobenzaprine 5 MG tablet Commonly known as: FLEXERIL Take 1 tablet (5 mg total) by mouth every 8 (eight) hours as needed for muscle spasms.   diclofenac sodium 1 % Gel Commonly known as: VOLTAREN Apply 4 g topically 4 (four) times daily. What changed:   when to take this  reasons to take this   Emgality 120 MG/ML Soaj Generic drug: Galcanezumab-gnlm Inject 120 mg into the skin every 30 (thirty) days.   ferrous sulfate 325 (65 FE) MG tablet Take 1 tablet (325 mg total) by mouth daily with breakfast. OK to take every other day if it causes constipation   fluticasone 50 MCG/ACT nasal spray Commonly known as: FLONASE Place 2 sprays into  both nostrils daily as needed for allergies or rhinitis.   folic acid 1 MG tablet Commonly known as: FOLVITE Take 1 tablet (1 mg total) by mouth daily. Start taking on: November 11, 2020   gabapentin 800 MG tablet Commonly known as: NEURONTIN Take 800 mg by mouth 3 (three) times daily.   hydrOXYzine 50 MG capsule Commonly known as: VISTARIL Take 50 mg by mouth 2 (two) times daily as needed for anxiety.   ibuprofen 200 MG tablet Commonly known as: ADVIL Take 600 mg by mouth every 6 (six) hours as needed (for aches, migraines, or headaches).   Jencycla 0.35 MG tablet Generic drug: norethindrone Take  1 tablet by mouth daily.   ondansetron 4 MG tablet Commonly known as: ZOFRAN TAKE 1 TABLET BY MOUTH EVERY 6 HOURS AS NEEDED FOR NAUSEA FOR VOMITING What changed:   how much to take  how to take this  when to take this  reasons to take this  additional instructions   pantoprazole 40 MG tablet Commonly known as: PROTONIX Take 1 tablet (40 mg total) by mouth daily.   ProAir HFA 108 (90 Base) MCG/ACT inhaler Generic drug: albuterol Inhale 2 puffs into the lungs every 6 (six) hours as needed for wheezing or shortness of breath.   propranolol ER 60 MG 24 hr capsule Commonly known as: INDERAL LA Take 1 capsule (60 mg total) by mouth daily.   Refresh Optive Advanced PF 0.5-1-0.5 % Soln Generic drug: Carboxymeth-Glyc-Polysorb PF Place 1 drop into both eyes 4 (four) times daily as needed (for dryness).   rizatriptan 10 MG disintegrating tablet Commonly known as: MAXALT-MLT 1 TABLET FOR MIGRAINE. MAY REPEAT IN 2 HOURS. What changed:   how much to take  how to take this  when to take this  additional instructions   Suboxone 8-2 MG Film Generic drug: Buprenorphine HCl-Naloxone HCl Place 1 Film under the tongue in the morning and at bedtime.   thiamine 100 MG tablet Take 1 tablet (100 mg total) by mouth daily. Start taking on: November 11, 2020   valACYclovir  1000 MG tablet Commonly known as: VALTREX Take 1,000 mg by mouth daily as needed (as directed for flares).   venlafaxine XR 75 MG 24 hr capsule Commonly known as: EFFEXOR-XR Take 1 capsule (75 mg total) by mouth daily with breakfast. What changed: how much to take       Discharge Instructions: Please refer to Patient Instructions section of EMR for full details.  Patient was counseled important signs and symptoms that should prompt return to medical care, changes in medications, dietary instructions, activity restrictions, and follow up appointments.   Follow-Up Appointments:  Future Appointments  Date Time Provider Department Center  11/18/2020  9:30 AM Joana Reamer, DO [facility]  11/18/2020  3:00 PM Benjaman Lobe [facility]  11/25/2020  3:00 PM Dessie Coma, PT [facility]  01/26/2021 11:00 AM Shawnie Dapper, NP [facility]     Maury Dus, MD 11/10/2020, 11:15 AM PGY-1 [facility]  FPTS Upper-Level Resident Addendum   I have independently interviewed and examined the patient. I have discussed the above with the original author and agree with their documentation. My edits for correction/addition/clarification are in blue. Please see also any attending notes.    Mirian Mo MD PGY-3, [facility] 11/10/2020 3:22 PM  FPTS Service pager: [phone] (text pages welcome through [facility])

## 2020-11-10 NOTE — Progress Notes (Addendum)
Family Medicine Teaching Service Daily Progress Note Intern Pager: [phone]  Patient name: Gail Blackburn Medical record number: [id number] Date of birth: May 20, 1976 Age: 45 y.o. Gender: female  Primary Care Provider: Leeroy Bock, DO Consultants: GI, heme-onc Code Status: Full  Pt Overview and Major Events to Date:  2/8: admitted with pancreatitis 2/12: CT/MRI imaging shows early pseudocyst and non-occlusive splenic vein thrombosis  Assessment and Plan: Gail Blackburn a 45 y.o.femalewho presented with worsening abdominal pain and PO intolerance. PMH is significant foralcohol use disorder, bipolar disorder, migraines, chronic pain, prior opioid use (now on suboxone), fibromyalgia, anxiety/depression, tobacco use disorder.  Acute Alcoholic Pancreatitis with Early Pseudocyst  Non-Occlusive Splenic Vein Thrombosis Pain is much improved and she has no significant tenderness on exam.  Tolerating full liquid diet. Lipase 89 this morning (from 956 on admission). -GI following, appreciate recommendations -Per GI and heme-onc, no need for anti-coagulation for splenic vein thrombosis at this time  -Recheck CT in 8 weeks to rule out clot progression -Will advance to regular diet  -Continue Tylenol q6h scheduled, Gabapentin for pain -Continue Zofran prn -Protonix 40mg  daily -Avoid Narcotics  Constipation- resolved Patient has had several bowel movements over the past 2 days. -Continue Miralax BID and Mag Citrate daily prn  Alcohol Use Disorder Last drink 2/7, likely outside the window of withdrawal at this point. -Continue daily thiamine, multivitamin and folic acid -SW to assist with cessation resources  Normocytic Anemia Stable. Hgb 11.0 this morning. Iron panel was suggestive of iron deficiency. -PO iron on discharge  Anxiety/Depression Patient still with a lot of anxiety, particularly about being in the hospital -Continue home Wellbutrin and Klonopin -Atarax TID  prn -Restarted Venlafaxine 75mg  daily  Migraines -Continue home Propanolol  Chronic Pain On Suboxone, Gabapentin and Flexeril at home -Continue home meds  Tobacco Use Disorder -Nicotine patch daily  FEN/GI: Full liquid diet, advance as tolerated PPx: Lovenox   Status is: Inpatient Remains inpatient appropriate because:Ongoing active pain requiring inpatient pain management  Dispo: The patient is from: Home              Anticipated d/c is to: Home              Anticipated d/c date is: This afternoon   Difficult to place patient No    Subjective:  No acute events overnight. Patient's pain continues to improve this morning.  She is tolerating a full liquid diet and feels ready to advance to solids.    Objective: Temp:  [97.9 F (36.6 C)-98.6 F (37 C)] 97.9 F (36.6 C) (02/15 0756) Pulse Rate:  [76-98] 98 (02/15 0756) Resp:  [16-18] 16 (02/15 0756) BP: (90-104)/(65-74) 96/69 (02/15 0756) SpO2:  [95 %-100 %] 100 % (02/15 0756)  Physical Exam:  General: 45 y.o. female in NAD Cardio: RRR no m/r/g Lungs: CTAB, no wheezing, no rhonchi, no crackles, no IWOB on room air Abdomen: +BS, soft, nontender, nondistended Skin: warm and dry Extremities: No edema  Laboratory: Recent Labs  Lab 11/08/20 0430 11/09/20 0430 11/10/20 0510  WBC 5.7 6.4 6.3  HGB 9.6* 10.2* 11.0*  HCT 29.4* 29.7* 33.1*  PLT 285 360 425*   Recent Labs  Lab 11/03/20 1311 11/03/20 2100 11/08/20 0430 11/09/20 0430 11/10/20 0510  NA 136   < > 139 137 137  K 4.0   < > 3.5 3.5 3.6  CL 105   < > 106 105 106  CO2 24   < > 22 23 22   BUN 10   < > <  5* <5* <5*  CREATININE 0.65   < > 0.56 0.60 0.61  CALCIUM 8.1*   < > 8.6* 9.0 9.3  PROT 5.7*  --   --   --   --   BILITOT 0.8  --   --   --   --   ALKPHOS 86  --   --   --   --   ALT 21  --   --   --   --   AST 33  --   --   --   --   GLUCOSE 115*   < > 85 94 101*   < > = values in this interval not displayed.     Imaging/Diagnostic Tests: No  new imaging in past 24h.   Maury Dus, MD 11/10/2020, 8:35 AM PGY-1, [facility] FPTS Intern pager: [phone], text pages welcome

## 2020-11-10 NOTE — Progress Notes (Signed)
Plans for discharge noted.  Patient has been tolerating a solid diet.  She is sitting up in bed in no evident distress.  Lipase is markedly improved this morning, at 89.  Importance of complete alcohol avoidance noted, especially in the near term.  Prognosis and natural history of alcohol induced pancreatitis discussed.  The patient has follow-up in the family practice clinic already arranged.  We will be on standby if questions arise during this patient's follow-up, and would be happy to see the patient upon request.  As a reminder, the patient should have a follow-up CT scan in approximately 6 to 8 weeks from now to monitor the progress of her pancreatic fluid collection.  Please call me if you have questions.  Florencia Reasons, M.D. Pager [phone] If no answer or after 5 PM call [phone]

## 2020-11-10 NOTE — Discharge Instructions (Signed)
Dear Gail Blackburn,   Thank you for letting us participate in your care! In this section, you will find a brief hospital admission summary of why you were admitted to the hospital, what happened during your admission, your diagnosis/diagnoses, and recommended follow up.   You were admitted because you were experiencing abdominal pain.   You were diagnosed with pancreatitis.  You were also found to have a clot in your splenic vein and slight iron deficiency anemia.  You were treated with pain medications and IV fluids.   You were also seen by the gastroenterologists and hematology doctors. They recommended repeat imaging in 6-8 weeks.  Your pain improved and you were discharged from the hospital for meeting this goal.    POST-HOSPITAL & CARE INSTRUCTIONS 1. We have adjusted several of your medications. Please see medications section of this packet for details 2. Increase your diet slowly (stick to bland foods for the next 2-3 days) 3. Do not drink any alcohol ever again  DOCTOR'S APPOINTMENTS & FOLLOW UP Future Appointments  Date Time Provider Department Center  11/11/2020  1:30 PM Dessie Coma, PT [facility]  11/18/2020  3:00 PM Milford Cage, PT [facility]  11/25/2020  3:00 PM Dessie Coma, PT [facility]  01/26/2021 11:00 AM Lomax, Amy, NP [facility] None     Thank you for choosing [facility]! Take care and be well!  Family Medicine Teaching Service Inpatient Team Christopher  [facility]  [address] [place], [place] [address] [phone]

## 2020-11-10 NOTE — Plan of Care (Signed)
  Problem: Health Behavior/Discharge Planning: Goal: Ability to manage health-related needs will improve Outcome: Adequate for Discharge   Problem: Clinical Measurements: Goal: Ability to maintain clinical measurements within normal limits will improve Outcome: Adequate for Discharge Goal: Will remain free from infection Outcome: Adequate for Discharge Goal: Diagnostic test results will improve Outcome: Adequate for Discharge Goal: Respiratory complications will improve Outcome: Adequate for Discharge Goal: Cardiovascular complication will be avoided Outcome: Adequate for Discharge   Problem: Coping: Goal: Level of anxiety will decrease Outcome: Adequate for Discharge   Problem: Elimination: Goal: Will not experience complications related to bowel motility Outcome: Adequate for Discharge Goal: Will not experience complications related to urinary retention Outcome: Adequate for Discharge   Problem: Pain Managment: Goal: General experience of comfort will improve Outcome: Adequate for Discharge   Problem: Safety: Goal: Ability to remain free from injury will improve Outcome: Adequate for Discharge   Problem: Skin Integrity: Goal: Risk for impaired skin integrity will decrease Outcome: Adequate for Discharge   

## 2020-11-10 NOTE — TOC Transition Note (Signed)
Transition of Care [facility]) - CM/SW Discharge Note   Patient Details  Name: Dillie Burandt MRN: [id number] Date of Birth: [birth year]  [facility] CM/SW Contact:  Kermit Balo, RN Phone Number: 11/10/2020, 1:46 PM   Clinical Narrative:    Pt discharging home with self care. No f/u per PT/OT and no DME needs.  Pt states she has transportation home.   Final next level of care: Home/Self Care Barriers to Discharge: No Barriers Identified   Patient Goals and CMS Choice        Discharge Placement                       Discharge Plan and Services   Discharge Planning Services: CM Consult                                 Social Determinants of Health (SDOH) Interventions     Readmission Risk Interventions No flowsheet data found.

## 2020-11-11 ENCOUNTER — Ambulatory Visit: Payer: Medicaid Other | Admitting: Physical Therapy

## 2020-11-17 NOTE — Progress Notes (Signed)
Subjective:   Patient ID: Gail Blackburn    DOB: [birth year], 45 y.o. female   MRN: [id number]  Gail Blackburn is a 45 y.o. female with a history of migraine, splenic vein thrombosis, acute pancreatitis, alcohol abuse with h/o withdrawal without complication, chronic fatigue syndrome with fibromyalgia, anxiety, chronic, pain, constipation, depression, low TSH level, tobacco use disorder, h/o prior opioid use (now on suboxone),  here for hospital Follow up  Hospital Follow up 2/2 Acute Pancreatitis: Patient was initially evaluated for acute epigastric pain on 2/2 and noted to have mild pancreatitis 2/2 to alcohol abuse. She continued to drink alcohol leading to worsening symptoms and intolerance to PO requiring hospitalization. She was hospitalized from 2/7 to 2/15. Her lipase was noted to be elevated to 956 and RUQ ultrasound showing pancreatitis without biliary stones. Her pain was managed with NSAIDs, tylenol, Zofran, Gabapentin, and Protonix. Her diet was slowly advanced. She did have CT obtained later in admission that revealed early pseudocyst formation. On day of discharge, patient's lipase down-trended to 89. Today she notes things are doing much better. Pain is less often. Not interested in AA meetings. Does plan to seek outpatient assistance if cravings restart - she has list of outpatient and inpatient resources at home.  Alcohol use: denies any use and denies any cravings.  Tolerating PO: yes, eating 3 meals a day Pain: well controlled with Tylenol and heating pad Bowel movement: regular, daily soft BM  Nonocclusive Splenic vein Thrombosis: Additional imaging during hospitalization revealed early pseudocyst formation and nonocclusive splenic vein thrombosis. Hematology was consulted and did not recommend anticoagulation. Recommended repeat CT abdomen in 6-8 week to ensure no progression of splenic vein thrombosis. If progression, anticoagulation may be warranted at that time.    Review of  Systems:  Per HPI.   Objective:   BP 92/68   Pulse (!) 113   Wt 125 lb 9.6 oz (57 kg)   LMP  (LMP Unknown)   SpO2 98%   BMI 20.90 kg/m  Vitals and nursing note reviewed.  General: pleasant thin middle aged woman, appears younger than stated age, sitting comfortably in exam chair, well nourished, well developed, in no acute distress with non-toxic appearance CV: regular rate and rhythm without murmurs, rubs, or gallops Lungs: clear to auscultation bilaterally with normal work of breathing on room air, speaking in full sentences Abdomen: soft, mild tenderness to epigastric and LUQ to palpation, otherwise nontender, non-distended, normoactive bowel sounds Skin: warm, dry Extremities: warm and well perfused MSK: gait normal Neuro: Alert and oriented, speech normal  Assessment & Plan:   Splenic vein thrombosis Acute, likely related to acute pancreatitis. Nonocclusive. Per heme/onc, did not recommend anticoagulation at this time. Repeat CT scan in 8 weeks to evaluate for progression at which time anticoagulation may be warranted. Can consdier hypercoagulable work up at that time as well if indicated (Factor V liden, prothrombin gene mutation). - CT abdomen/pelvis with contrast ordered and to be scheduled for around April 11th - follow up and treat if indicated  Alcohol-induced acute pancreatitis Acute, improving. Pain is no intermittent and well controlled with tylenol. Tolerating PO with normal bowel movements. Has been avoiding alcohol as recommended. - continue to stay well hydrated - advance diet as tolerated - congratulated patient on success with alcohol cessation. Has outpatient and inpatient alcohol dependence resources. Encouraged to call if any assistance is needed.  - follow up as needed  Alcohol withdrawal syndrome without complication (HCC) Has been sober since the 7th of  February.  Congratulated patient with success. Denies any cravings. Has resources available if  needed. Declines AA Recommended patient to call if any assistance is needed  Orders Placed This Encounter  Procedures  . CT Abdomen Pelvis W Contrast    Follow up evaluation for Splenic vein thrombosis    Standing Status:   Future    Standing Expiration Date:   11/18/2021    Order Specific Question:   If indicated for the ordered procedure, I authorize the administration of contrast media per Radiology protocol    Answer:   Yes    Order Specific Question:   Is patient pregnant?    Answer:   No    Order Specific Question:   Preferred imaging location?    Answer:   [facility]    Order Specific Question:   Is Oral Contrast requested for this exam?    Answer:   Yes, Per Radiology protocol   No orders of the defined types were placed in this encounter.  Orpah Cobb, DO PGY-3, [facility] 11/18/2020 10:24 AM

## 2020-11-18 ENCOUNTER — Ambulatory Visit (INDEPENDENT_AMBULATORY_CARE_PROVIDER_SITE_OTHER): Payer: Medicaid Other | Admitting: Family Medicine

## 2020-11-18 ENCOUNTER — Telehealth: Payer: Self-pay

## 2020-11-18 ENCOUNTER — Other Ambulatory Visit: Payer: Self-pay

## 2020-11-18 ENCOUNTER — Ambulatory Visit: Payer: Medicaid Other | Admitting: Physical Therapy

## 2020-11-18 ENCOUNTER — Encounter: Payer: Self-pay | Admitting: Family Medicine

## 2020-11-18 VITALS — BP 92/68 | HR 113 | Wt 125.6 lb

## 2020-11-18 DIAGNOSIS — F10930 Alcohol use, unspecified with withdrawal, uncomplicated: Secondary | ICD-10-CM

## 2020-11-18 DIAGNOSIS — F10230 Alcohol dependence with withdrawal, uncomplicated: Secondary | ICD-10-CM | POA: Diagnosis not present

## 2020-11-18 DIAGNOSIS — I82890 Acute embolism and thrombosis of other specified veins: Secondary | ICD-10-CM | POA: Diagnosis present

## 2020-11-18 DIAGNOSIS — K852 Alcohol induced acute pancreatitis without necrosis or infection: Secondary | ICD-10-CM

## 2020-11-18 NOTE — Assessment & Plan Note (Signed)
Acute, likely related to acute pancreatitis. Nonocclusive. Per heme/onc, did not recommend anticoagulation at this time. Repeat CT scan in 8 weeks to evaluate for progression at which time anticoagulation may be warranted. Can consdier hypercoagulable work up at that time as well if indicated (Factor V liden, prothrombin gene mutation). - CT abdomen/pelvis with contrast ordered and to be scheduled for around April 11th - follow up and treat if indicated

## 2020-11-18 NOTE — Telephone Encounter (Signed)
Called patient with appointment.  [facility] [address] [facility] [phone] 12/03/2020 1600 with arrival at 1545  Cancel within 24 hours or be charged $75 cancellation fee.  Pick up contrast 2 days prior to imaging 4 hours NPO only liquids  Patient verbally expressed understanding as well as having MYCHART.  Glennie Hawk, CMA

## 2020-11-18 NOTE — Patient Instructions (Addendum)
It was a pleasure to see you today!  Thank you for choosing [facility] for your primary care.   Our plans for today were:  I have ordered follow up CT scan to monitor your small blood clot that was noticed on your CT scan. This was small and likely from your acute infection/inflammation. We are getting updated imaging to make sure it is clearing up as it is supposed to.  To keep you healthy, please keep in mind the following health maintenance items that you are due for:   1. Pap-smear 2.  Annual Exam  Please schedule annual physical exam at earliest convenience in order to get blood work and papsmear  [employer],   [employer], DO

## 2020-11-18 NOTE — Assessment & Plan Note (Signed)
Acute, improving. Pain is no intermittent and well controlled with tylenol. Tolerating PO with normal bowel movements. Has been avoiding alcohol as recommended. - continue to stay well hydrated - advance diet as tolerated - congratulated patient on success with alcohol cessation. Has outpatient and inpatient alcohol dependence resources. Encouraged to call if any assistance is needed.  - follow up as needed

## 2020-11-18 NOTE — Assessment & Plan Note (Signed)
Has been sober since the 7th of February.  Congratulated patient with success. Denies any cravings. Has resources available if needed. Declines AA Recommended patient to call if any assistance is needed

## 2020-11-25 ENCOUNTER — Ambulatory Visit: Payer: Medicaid Other | Attending: Family Medicine | Admitting: Physical Therapy

## 2020-11-25 ENCOUNTER — Encounter: Payer: Self-pay | Admitting: Physical Therapy

## 2020-11-25 ENCOUNTER — Other Ambulatory Visit: Payer: Self-pay

## 2020-11-25 ENCOUNTER — Telehealth: Payer: Self-pay

## 2020-11-25 DIAGNOSIS — R252 Cramp and spasm: Secondary | ICD-10-CM | POA: Diagnosis present

## 2020-11-25 DIAGNOSIS — M6281 Muscle weakness (generalized): Secondary | ICD-10-CM | POA: Insufficient documentation

## 2020-11-25 DIAGNOSIS — R293 Abnormal posture: Secondary | ICD-10-CM | POA: Diagnosis present

## 2020-11-25 DIAGNOSIS — G43001 Migraine without aura, not intractable, with status migrainosus: Secondary | ICD-10-CM | POA: Insufficient documentation

## 2020-11-25 DIAGNOSIS — M542 Cervicalgia: Secondary | ICD-10-CM | POA: Diagnosis present

## 2020-11-25 NOTE — Telephone Encounter (Signed)
Called to inform patient of CT and she is aware and will pick up contrast today.  CT Abdomen Pelvis w/ contrast  12/03/2020   [facility] [address] [phone]  1600 with arrival at 1545  .Glennie Hawk, CMA'

## 2020-11-26 ENCOUNTER — Telehealth: Payer: Self-pay | Admitting: Family Medicine

## 2020-11-26 ENCOUNTER — Encounter: Payer: Self-pay | Admitting: Physical Therapy

## 2020-11-26 NOTE — Telephone Encounter (Signed)
Called patient and informed her she has refills. She stated she doesn't need refills, but pharmacy needs Emgality PA. She has no other insurance but Medicaid. I advised will start PA. Patient verbalized understanding, appreciation. Medicaid PA for Emgality filled out, on NP's desk for review, signature.

## 2020-11-26 NOTE — Telephone Encounter (Signed)
Pt request refill Galcanezumab-gnlm (EMGALITY) 120 MG/ML SOAJ at [facility] [phone]

## 2020-11-26 NOTE — Telephone Encounter (Signed)
Emg[employer] PA signed and faxed to [employer] [facility]. Will call for decision next week.

## 2020-11-26 NOTE — Therapy (Signed)
[facility] [address] [place], [place], [address] Phone: [phone]   Fax:  [phone]  Physical Therapy Treatment/Re-cert   Patient Details  Name: Gail Blackburn MRN: [id number] Date of Birth: 1975-12-05 Referring Provider (PT): Shawnie Dapper, NP   Encounter Date: 11/25/2020   PT End of Session - 11/26/20 0835    Visit Number 12    Number of Visits 17    Date for PT Re-Evaluation 01/07/21    Authorization Type MCD of [place]    Authorization Time Period re-certed    PT Start Time 1500    PT Stop Time 1543    PT Time Calculation (min) 43 min    Activity Tolerance Patient tolerated treatment well    Behavior During Therapy [facility] for tasks assessed/performed           Past Medical History:  Diagnosis Date  . Anemia    PRIOR HISTORY  . Anxiety   . Asthma   . Bipolar disorder (HCC)    Borderline  . Depression   . Fibromyalgia   . Headache    MIGRAINES  . Hypothyroidism 2009   TOOK MEDS FOR FEW WEEKS NO MEDS NOW  . Migraine   . Thyroid disease    treated in past not sure if too high or too low    Past Surgical History:  Procedure Laterality Date  . COLONOSCOPY    . DILATION AND EVACUATION N/A 05/06/2016   Procedure: DILATATION AND EVACUATION;  Surgeon: Huel Cote, MD;  Location: [facility];  Service: Gynecology;  Laterality: N/A;  . FOOT SURGERY    . TEETH PULLED  03/2016  . WISDOM TOOTH EXTRACTION      There were no vitals filed for this visit.   Subjective Assessment - 11/25/20 1508    Subjective Patient has been in the hopsital for about a week. She has been having migranes. She had one ealier today but iit is a little better.    Patient Stated Goals decrease tension & migraines    Currently in Pain? Yes    Pain Score 6     Pain Location Neck    Pain Orientation Left;Right    Pain Descriptors / Indicators Guarding    Pain Type Chronic pain    Pain Onset More than a month ago    Pain Frequency Constant     Aggravating Factors  stress    Pain Relieving Factors dry needling    Multiple Pain Sites No              [facility] PT Assessment - 11/26/20 0001      AROM   Cervical Flexion 40    Cervical Extension 34   significant pain   Cervical - Right Rotation 58    Cervical - Left Rotation 40      Strength   Overall Strength Comments 4+/5 bilateral er      Palpation   Palpation comment spasming in bilateral upper traps and into bilateral scpaula area                         [facility] Adult PT Treatment/Exercise - 11/26/20 0001      Shoulder Exercises: Standing   Other Standing Exercises reviewed door  exercises for HEP      Shoulder Exercises: Stretch   Other Shoulder Stretches upper trap and levator stretch 3x20 sec hold bilateral      Manual Therapy   Manual therapy  comments skilled palpation and monitoring during TPDN    Soft tissue mobilization Rt upper trap, levator, mid trap, rhomboids    Manual Traction sub-occipital release            Trigger Point Dry Needling - 11/26/20 0001    Muscles Treated Head and Neck Cervical multifidi;Suboccipitals    Upper Trapezius Response Twitch reponse elicited;Palpable increased muscle length    Suboccipitals Response Palpable increased muscle length;Twitch response elicited    Cervical multifidi Response Twitch reponse elicited;Palpable increased muscle length                PT Education - 11/26/20 0834    Education Details reviewed stretching for home and HEP    Person(s) Educated Patient    Methods Explanation;Demonstration;Tactile cues;Verbal cues    Comprehension Returned demonstration;Verbal cues required;Tactile cues required;Verbalized understanding            PT Short Term Goals - 11/26/20 0850      PT SHORT TERM GOAL #1   Title Cervical rotation within 5 deg Rt to Lt to decrease unequal strain    Baseline still 8 degree difference    Time 4    Period Weeks    Status On-going    Target Date  12/24/20      PT SHORT TERM GOAL #2   Title will verbalize performing HEP at least every other day    Baseline has not been able to 2nd to hospitilization    Time 4    Period Weeks    Status On-going    Target Date 12/24/20      PT SHORT TERM GOAL #3   Title GHJ ER strength to 5/5    Baseline 4+/5 bilateral ER    Time 4    Period Weeks    Status On-going    Target Date 12/24/20             PT Long Term Goals - 11/26/20 0852      PT LONG TERM GOAL #1   Title pt will be able to drive comfortably without limitation by neck pain    Baseline still having difficulty turning her head    Time 6    Period Weeks    Status On-going      PT LONG TERM GOAL #2   Title HA/migraines to =<3 days/week    Baseline continues ot have migranes    Time 6    Period Weeks    Status On-going    Target Date 01/07/21      PT LONG TERM GOAL #3   Title Average pain with ADLs <=4/10    Baseline average 6-7/10 with spikes    Time 6    Period Weeks    Status On-going    Target Date 01/07/21                 Plan - 11/26/20 0839    Clinical Impression Statement Patient was in the hospital for 9 days so she was not able to attend therapy during her current apprved visit dates. She has increased pain in her neck since her admission. She continues to have frequent migrnaes.  Her motion is about the same since her recertivfication. She continues to have pain with rotation. She has spasming of her upper traps and into her cerivcal paraspinals. She would benefit from further skilled therapy 1W6 to decrease tightness of cervical muscualture,  improve posture and oversall strength.    Personal Factors and  Comorbidities Comorbidity 1;Time since onset of injury/illness/exacerbation    Comorbidities fibromyalgia, high stress levels due to home environment    Examination-Activity Limitations Bathing;Reach Overhead;Sit;Caring for Others;Carry;Sleep;Lift    Examination-Participation Restrictions  Cleaning;Meal Prep;Community Activity;Driving;Laundry    Stability/Clinical Decision Making Evolving/Moderate complexity    Clinical Decision Making Moderate    Rehab Potential Good    PT Frequency 1x / week    PT Duration 6 weeks    PT Treatment/Interventions ADLs/Self Care Home Management;Cryotherapy;Electrical Stimulation;Traction;Moist Heat;Iontophoresis 4mg /ml Dexamethasone;Therapeutic activities;Therapeutic exercise;Patient/family education;Neuromuscular re-education;Manual techniques;Taping;Dry needling;Joint Manipulations;Spinal Manipulations    PT Next Visit Plan DN PRN    PT Home Exercise Plan from last episode    Consulted and Agree with Plan of Care Patient           Patient will benefit from skilled therapeutic intervention in order to improve the following deficits and impairments:  Improper body mechanics,Pain,Postural dysfunction,Increased muscle spasms,Decreased activity tolerance,Decreased strength,Impaired UE functional use,Hypermobility  Visit Diagnosis: Migraine without aura and with status migrainosus, not intractable  Cervicalgia  Abnormal posture  Muscle weakness (generalized)  Cramp and spasm     Problem List Patient Active Problem List   Diagnosis Date Noted  . Splenic vein thrombosis   . Alcohol use disorder, moderate, dependence (HCC)   . Constipation   . Alcohol-induced acute pancreatitis   . Alcohol withdrawal syndrome without complication (HCC)   . Hyponatremia   . Pain of upper abdomen 10/28/2020  . Tinea manuum 05/21/2019  . Abdominal wall lump 02/21/2018  . Chronic fatigue syndrome with fibromyalgia 02/24/2017  . Skin lesion of left leg 02/10/2017  . History of abnormal cervical Pap smear 02/10/2017  . Anxiety 10/15/2014  . Depression 10/15/2014  . Migraine 10/15/2014  . Chronic pain 10/15/2014  . Low TSH level 10/15/2014  . Tobacco use disorder 10/15/2014    10/17/2014 PT DPT  11/26/2020, 8:55 AM  [facility] [address] [place], [place], [place] Phone: [phone]   Fax:  [phone]  Name: Gail Blackburn MRN: Angeline Slim Date of Birth: September 24, 1976

## 2020-11-30 ENCOUNTER — Encounter: Payer: Self-pay | Admitting: *Deleted

## 2020-11-30 NOTE — Telephone Encounter (Addendum)
Called [employer] to check status of [place] PA, spoke with [place]. Call Ref # [id number],  she transferred me to pharmacy, spoke with [place] who staetd Emg[employer] is  approved 11/27/20-  11/22/21, interaction ID-# [id number]. Sent her my chart to inform of approval.

## 2020-12-03 ENCOUNTER — Other Ambulatory Visit: Payer: Self-pay

## 2020-12-03 ENCOUNTER — Ambulatory Visit
Admission: RE | Admit: 2020-12-03 | Discharge: 2020-12-03 | Disposition: A | Discharge status: Home / Self Care | Payer: Medicaid Other | Source: Ambulatory Visit | Attending: Family Medicine | Admitting: Family Medicine

## 2020-12-03 DIAGNOSIS — I82890 Acute embolism and thrombosis of other specified veins: Secondary | ICD-10-CM

## 2020-12-03 MED ORDER — IOPAMIDOL (ISOVUE-300) INJECTION 61%
100.0000 mL | Freq: Once | INTRAVENOUS | Status: AC | PRN
  Administered 2020-12-03: 100 mL via INTRAVENOUS

## 2020-12-10 ENCOUNTER — Encounter: Payer: Self-pay | Admitting: Family Medicine

## 2020-12-16 ENCOUNTER — Ambulatory Visit: Payer: Medicaid Other | Admitting: Physical Therapy

## 2020-12-22 ENCOUNTER — Other Ambulatory Visit: Payer: Self-pay | Admitting: Family Medicine

## 2020-12-22 DIAGNOSIS — G43709 Chronic migraine without aura, not intractable, without status migrainosus: Secondary | ICD-10-CM

## 2020-12-23 ENCOUNTER — Other Ambulatory Visit: Payer: Self-pay

## 2020-12-23 ENCOUNTER — Encounter: Payer: Self-pay | Admitting: Physical Therapy

## 2020-12-23 ENCOUNTER — Ambulatory Visit: Payer: Medicaid Other | Admitting: Physical Therapy

## 2020-12-23 DIAGNOSIS — M542 Cervicalgia: Secondary | ICD-10-CM

## 2020-12-23 DIAGNOSIS — R293 Abnormal posture: Secondary | ICD-10-CM

## 2020-12-23 DIAGNOSIS — G43001 Migraine without aura, not intractable, with status migrainosus: Secondary | ICD-10-CM

## 2020-12-23 DIAGNOSIS — M6281 Muscle weakness (generalized): Secondary | ICD-10-CM

## 2020-12-24 NOTE — Therapy (Addendum)
[facility] [address] [place], [place], [address] Phone: [phone]   Fax:  [phone]  Physical Therapy Treatment  Patient Details  Name: Gail Blackburn MRN: [id number] Date of Birth: 12/14/1975 Referring Provider (PT): Shawnie Dapper, NP   Encounter Date: 12/23/2020   PT End of Session - 12/23/20 1539    Visit Number 13    Number of Visits 19    Date for PT Re-Evaluation 02/04/21    Authorization Type MCD of [place] [address]    Authorization Time Period sent in re-certification    PT Start Time 1545    PT Stop Time 1626    PT Time Calculation (min) 41 min    Activity Tolerance Patient tolerated treatment well    Behavior During Therapy [facility] for tasks assessed/performed           Past Medical History:  Diagnosis Date  . Anemia    PRIOR HISTORY  . Anxiety   . Asthma   . Bipolar disorder (HCC)    Borderline  . Depression   . Fibromyalgia   . Headache    MIGRAINES  . Hypothyroidism 2009   TOOK MEDS FOR FEW WEEKS NO MEDS NOW  . Migraine   . Thyroid disease    treated in past not sure if too high or too low    Past Surgical History:  Procedure Laterality Date  . COLONOSCOPY    . DILATION AND EVACUATION N/A 05/06/2016   Procedure: DILATATION AND EVACUATION;  Surgeon: Huel Cote, MD;  Location: [facility];  Service: Gynecology;  Laterality: N/A;  . FOOT SURGERY    . TEETH PULLED  03/2016  . WISDOM TOOTH EXTRACTION      There were no vitals filed for this visit.   Subjective Assessment - 12/23/20 1504    Subjective Patient is having pain in hte right side of her neck. She has been getting weekly mssages which have helped. She feels tightness on both sides.    Pertinent History anxiety, bi-poalr, migranes, fibromyalgia,    Currently in Pain? Yes    Pain Score 6     Pain Location Neck    Pain Orientation Left    Pain Descriptors / Indicators Aching    Pain Type Chronic pain    Pain Onset More than a month ago    Pain  Frequency Constant    Pain Relieving Factors dry needling    Multiple Pain Sites No              [facility] PT Assessment - 12/24/20 0001      AROM   Cervical Flexion 40    Cervical Extension 38    Cervical - Right Rotation 50    Cervical - Left Rotation 70      Strength   Overall Strength Comments 4+/5 bilateral er      Palpation   Palpation comment spasming in bilateral upper traps and into bilateral scpaula area                         [facility] Adult PT Treatment/Exercise - 12/24/20 0001      Shoulder Exercises: Seated   Other Seated Exercises Bialteral ER 2x10 red; horizontal abdcution 2x10 red; shoulder flexion 2x10 red      Manual Therapy   Manual therapy comments skilled palpation and monitoring during TPDN    Soft tissue mobilization Rt upper trap, levator, mid trap, rhomboids    Manual Traction sub-occipital  release                  PT Education - 12/23/20 1538    Education Details reviewed HEP and symptom mangement    Person(s) Educated Patient    Methods Explanation;Demonstration;Tactile cues;Verbal cues    Comprehension Verbalized understanding;Returned demonstration;Verbal cues required;Tactile cues required            PT Short Term Goals - 12/24/20 1259      PT SHORT TERM GOAL #1   Title Cervical rotation within 5 deg Rt to Lt to decrease unequal strain    Baseline right 50 left 70    Time 4    Period Weeks    Status On-going    Target Date 12/24/20      PT SHORT TERM GOAL #2   Title will verbalize performing HEP at least every other day    Baseline is becoming more consitent    Time 4    Period Weeks    Status On-going      PT SHORT TERM GOAL #3   Title GHJ ER strength to 5/5    Baseline 4+/5 bilateral ER    Time 4    Period Weeks    Status On-going    Target Date 12/24/20             PT Long Term Goals - 12/24/20 1302      PT LONG TERM GOAL #1   Title pt will be able to drive comfortably without limitation  by neck pain    Baseline improving but still painful    Time 6    Period Weeks    Status On-going      PT LONG TERM GOAL #2   Title HA/migraines to =<3 days/week    Baseline continues to have a migrane every few days    Time 6    Period Weeks    Status On-going      PT LONG TERM GOAL #3   Title Average pain with ADLs <=4/10    Baseline average 6-7/10 with spikes    Time 6    Period Weeks    Status On-going                 Plan - 12/24/20 0926    Clinical Impression Statement Patient has not been able to come for the past few weeks. Depsite not coming she has had an improvement in left rotation. Her right rotation is still limited. She continues to have significant spasming. She is still haveing headaches but they are intermittent. She is wroking back into an exercise program. She would benefit from continued skilled therapy 1W6. She reports she should have improved ability to come in over the nextr few weeks.    Personal Factors and Comorbidities Comorbidity 1;Time since onset of injury/illness/exacerbation    Comorbidities fibromyalgia, high stress levels due to home environment    Examination-Activity Limitations Bathing;Reach Overhead;Sit;Caring for Others;Carry;Sleep;Lift    Examination-Participation Restrictions Cleaning;Meal Prep;Community Activity;Driving;Laundry    Stability/Clinical Decision Making Evolving/Moderate complexity    Clinical Decision Making Moderate    Rehab Potential Good    PT Frequency 1x / week    PT Duration 6 weeks    PT Treatment/Interventions ADLs/Self Care Home Management;Cryotherapy;Electrical Stimulation;Traction;Moist Heat;Iontophoresis 4mg /ml Dexamethasone;Therapeutic activities;Therapeutic exercise;Patient/family education;Neuromuscular re-education;Manual techniques;Taping;Dry needling;Joint Manipulations;Spinal Manipulations    PT Next Visit Plan continue with trigger point dry needling. continue to work on ROM    PT Home Exercise Plan  from  last episode    Consulted and Agree with Plan of Care Patient           Patient will benefit from skilled therapeutic intervention in order to improve the following deficits and impairments:  Improper body mechanics,Pain,Postural dysfunction,Increased muscle spasms,Decreased activity tolerance,Decreased strength,Impaired UE functional use,Hypermobility  Visit Diagnosis: Migraine without aura and with status migrainosus, not intractable  Cervicalgia  Abnormal posture  Muscle weakness (generalized)     Problem List Patient Active Problem List   Diagnosis Date Noted  . Splenic vein thrombosis   . Alcohol use disorder, moderate, dependence (HCC)   . Constipation   . Alcohol-induced acute pancreatitis   . Alcohol withdrawal syndrome without complication (HCC)   . Hyponatremia   . Pain of upper abdomen 10/28/2020  . Tinea manuum 05/21/2019  . Abdominal wall lump 02/21/2018  . Chronic fatigue syndrome with fibromyalgia 02/24/2017  . Skin lesion of left leg 02/10/2017  . History of abnormal cervical Pap smear 02/10/2017  . Anxiety 10/15/2014  . Depression 10/15/2014  . Migraine 10/15/2014  . Chronic pain 10/15/2014  . Low TSH level 10/15/2014  . Tobacco use disorder 10/15/2014    Dessie Coma PT DPT  12/24/2020, 1:05 PM  [facility] [address] [place], [place], [address] Phone: [phone]   Fax:  [phone]  Name: Gail Blackburn MRN: [id number] Date of Birth: 10/18/1975

## 2020-12-30 ENCOUNTER — Ambulatory Visit: Payer: Medicaid Other | Admitting: Physical Therapy

## 2021-01-06 ENCOUNTER — Ambulatory Visit: Payer: Medicaid Other | Attending: Family Medicine | Admitting: Physical Therapy

## 2021-01-06 ENCOUNTER — Other Ambulatory Visit: Payer: Self-pay

## 2021-01-06 ENCOUNTER — Telehealth: Payer: Self-pay | Admitting: Family Medicine

## 2021-01-06 DIAGNOSIS — M6281 Muscle weakness (generalized): Secondary | ICD-10-CM | POA: Insufficient documentation

## 2021-01-06 DIAGNOSIS — R293 Abnormal posture: Secondary | ICD-10-CM | POA: Diagnosis not present

## 2021-01-06 DIAGNOSIS — M542 Cervicalgia: Secondary | ICD-10-CM | POA: Diagnosis not present

## 2021-01-06 DIAGNOSIS — G43001 Migraine without aura, not intractable, with status migrainosus: Secondary | ICD-10-CM | POA: Diagnosis not present

## 2021-01-06 DIAGNOSIS — R252 Cramp and spasm: Secondary | ICD-10-CM | POA: Diagnosis not present

## 2021-01-06 NOTE — Telephone Encounter (Signed)
Patient has a Botox appointment coming up in May. Her PA on file with Medicaid for Botox expired 12/31/20. I filled out PA form for Botox and gave to work-in MD to sign (NP out this week).

## 2021-01-06 NOTE — Telephone Encounter (Signed)
Faxed PA to Medicaid with notes.

## 2021-01-07 ENCOUNTER — Encounter: Payer: Self-pay | Admitting: Physical Therapy

## 2021-01-07 NOTE — Therapy (Addendum)
[facility] - Inpatient Outpatient Rehabilitation [facility] [address] [place], [place], [address] Phone: [phone]   Fax:  [phone]  Physical Therapy Treatment  Patient Details  Name: Gail Blackburn MRN: [id number] Date of Birth: [birth year] Referring Provider (PT): Shawnie Dapper, NP   Encounter Date: 01/06/2021   PT End of Session - 01/07/21 0815    Visit Number 14    Number of Visits 19    Date for PT Re-Evaluation 02/04/21    Authorization Type MCD of     PT Start Time 1500    PT Stop Time 1544    PT Time Calculation (min) 44 min    Activity Tolerance Patient tolerated treatment well    Behavior During Therapy [facility] for tasks assessed/performed           Past Medical History:  Diagnosis Date  . Anemia    PRIOR HISTORY  . Anxiety   . Asthma   . Bipolar disorder (HCC)    Borderline  . Depression   . Fibromyalgia   . Headache    MIGRAINES  . Hypothyroidism 2009   TOOK MEDS FOR FEW WEEKS NO MEDS NOW  . Migraine   . Thyroid disease    treated in past not sure if too high or too low    Past Surgical History:  Procedure Laterality Date  . COLONOSCOPY    . DILATION AND EVACUATION N/A 05/06/2016   Procedure: DILATATION AND EVACUATION;  Surgeon: Huel Cote, MD;  Location: [facility];  Service: Gynecology;  Laterality: N/A;  . FOOT SURGERY    . TEETH PULLED  03/2016  . WISDOM TOOTH EXTRACTION      There were no vitals filed for this visit.   Subjective Assessment - 01/07/21 0811    Subjective Patient comes in with high levels of pain. She missed last weeks visit 2nd to insurance approval. She has had up to 8/10 pain in the right side of her neck and the back of her hesad    Pertinent History anxiety, bi-poalr, migranes, fibromyalgia,    Patient Stated Goals decrease tension & migraines    Currently in Pain? Yes    Pain Score 8     Pain Location Neck    Pain Orientation Left    Pain Descriptors / Indicators Aching    Pain Type Chronic pain    Pain  Onset More than a month ago    Pain Frequency Constant    Aggravating Factors  stress    Pain Relieving Factors dry needling    Multiple Pain Sites No                             [facility] Adult PT Treatment/Exercise - 01/07/21 0001      Shoulder Exercises: Standing   Other Standing Exercises scap retraction red 2x10; shoulder extnesion 2x10 red      Manual Therapy   Manual therapy comments skilled palpation and monitoring during TPDN    Soft tissue mobilization Rt upper trap, levator, mid trap, rhomboids    Manual Traction sub-occipital release            Trigger Point Dry Needling - 01/07/21 0001    Muscles Treated Head and Neck Cervical multifidi;Suboccipitals    Muscles Treated Back/Hip Thoracic multifidi    Upper Trapezius Response Twitch reponse elicited;Palpable increased muscle length    Suboccipitals Response Palpable increased muscle length;Twitch response elicited    Levator Scapulae Response Twitch  response elicited;Palpable increased muscle length    Cervical multifidi Response Twitch reponse elicited;Palpable increased muscle length    Thoracic multifidi response Twitch response elicited;Palpable increased muscle length                PT Education - 01/07/21 0813    Education Details reviewed tehcnique with exercises    Person(s) Educated Patient    Methods Explanation;Tactile cues;Verbal cues;Demonstration    Comprehension Verbalized understanding;Returned demonstration;Verbal cues required;Tactile cues required            PT Short Term Goals - 12/24/20 1259      PT SHORT TERM GOAL #1   Title Cervical rotation within 5 deg Rt to Lt to decrease unequal strain    Baseline right 50 left 70    Time 4    Period Weeks    Status On-going    Target Date 12/24/20      PT SHORT TERM GOAL #2   Title will verbalize performing HEP at least every other day    Baseline is becoming more consitent    Time 4    Period Weeks    Status On-going       PT SHORT TERM GOAL #3   Title GHJ ER strength to 5/5    Baseline 4+/5 bilateral ER    Time 4    Period Weeks    Status On-going    Target Date 12/24/20             PT Long Term Goals - 12/24/20 1302      PT LONG TERM GOAL #1   Title pt will be able to drive comfortably without limitation by neck pain    Baseline improving but still painful    Time 6    Period Weeks    Status On-going      PT LONG TERM GOAL #2   Title HA/migraines to =<3 days/week    Baseline continues to have a migrane every few days    Time 6    Period Weeks    Status On-going      PT LONG TERM GOAL #3   Title Average pain with ADLs <=4/10    Baseline average 6-7/10 with spikes    Time 6    Period Weeks    Status On-going                 Plan - 01/07/21 0817    Clinical Impression Statement Patient has significant spasming from her sub-occipitals on the right down into her peri-scpaular sraea. she reported soreness after trigger point dry needling but improved stiffness and pain. Therapy also focused on trigger point release and manaul therapy to hercervical spine. She is exercising 5x a week. She was encouraged to make sure to encorperate posterior chain strengthening into her daily rountine    Personal Factors and Comorbidities Comorbidity 1;Time since onset of injury/illness/exacerbation    Comorbidities fibromyalgia, high stress levels due to home environment    Examination-Activity Limitations Bathing;Reach Overhead;Sit;Caring for Others;Carry;Sleep;Lift    Examination-Participation Restrictions Cleaning;Meal Prep;Community Activity;Driving;Laundry    Stability/Clinical Decision Making Evolving/Moderate complexity    Clinical Decision Making Moderate    Rehab Potential Good    PT Frequency 1x / week    PT Duration 6 weeks    PT Treatment/Interventions ADLs/Self Care Home Management;Cryotherapy;Electrical Stimulation;Traction;Moist Heat;Iontophoresis 4mg /ml Dexamethasone;Therapeutic  activities;Therapeutic exercise;Patient/family education;Neuromuscular re-education;Manual techniques;Taping;Dry needling;Joint Manipulations;Spinal Manipulations    PT Next Visit Plan continue with trigger point dry needling. continue  to work on ROM    PT Home Exercise Plan from last episode    Consulted and Agree with Plan of Care Patient           Patient will benefit from skilled therapeutic intervention in order to improve the following deficits and impairments:  Improper body mechanics,Pain,Postural dysfunction,Increased muscle spasms,Decreased activity tolerance,Decreased strength,Impaired UE functional use,Hypermobility  Visit Diagnosis: Migraine without aura and with status migrainosus, not intractable  Cervicalgia  Abnormal posture  Muscle weakness (generalized)  Cramp and spasm     Problem List Patient Active Problem List   Diagnosis Date Noted  . Splenic vein thrombosis   . Alcohol use disorder, moderate, dependence (HCC)   . Constipation   . Alcohol-induced acute pancreatitis   . Alcohol withdrawal syndrome without complication (HCC)   . Hyponatremia   . Pain of upper abdomen 10/28/2020  . Tinea manuum 05/21/2019  . Abdominal wall lump 02/21/2018  . Chronic fatigue syndrome with fibromyalgia 02/24/2017  . Skin lesion of left leg 02/10/2017  . History of abnormal cervical Pap smear 02/10/2017  . Anxiety 10/15/2014  . Depression 10/15/2014  . Migraine 10/15/2014  . Chronic pain 10/15/2014  . Low TSH level 10/15/2014  . Tobacco use disorder 10/15/2014    Dessie Coma PT DPT  01/07/2021, 1:06 PM  [facility] [address] [place], [place], [address] Phone: [phone]   Fax:  [phone]  Name: Gail Blackburn MRN: [id number] Date of Birth: [birth year]

## 2021-01-07 NOTE — Telephone Encounter (Signed)
Received approval from Medicaid. [place] #[id number] (01/07/21- 01/02/22). Engineer, civil (consulting) and spoke with Claris Gower to provide this information. We scheduled Botox delivery for 4/19.

## 2021-01-12 NOTE — Telephone Encounter (Signed)
Received (2) 100 unit vials of Botox today from [employer].

## 2021-01-22 ENCOUNTER — Other Ambulatory Visit: Payer: Self-pay

## 2021-01-22 ENCOUNTER — Encounter: Payer: Self-pay | Admitting: Physical Therapy

## 2021-01-22 ENCOUNTER — Ambulatory Visit: Payer: Medicaid Other | Admitting: Physical Therapy

## 2021-01-22 DIAGNOSIS — M542 Cervicalgia: Secondary | ICD-10-CM

## 2021-01-22 DIAGNOSIS — G43001 Migraine without aura, not intractable, with status migrainosus: Secondary | ICD-10-CM | POA: Diagnosis not present

## 2021-01-22 DIAGNOSIS — R293 Abnormal posture: Secondary | ICD-10-CM

## 2021-01-22 DIAGNOSIS — R252 Cramp and spasm: Secondary | ICD-10-CM | POA: Diagnosis not present

## 2021-01-22 DIAGNOSIS — M6281 Muscle weakness (generalized): Secondary | ICD-10-CM | POA: Diagnosis not present

## 2021-01-22 NOTE — Therapy (Signed)
[facility] Outpatient Rehabilitation [facility] [address] [place], [place], [address] Phone: [phone]   Fax:  [phone]  Physical Therapy Treatment  Patient Details  Name: Gail Blackburn MRN: [id number] Date of Birth: [birth year] Referring Provider (PT): Shawnie Dapper, NP   Encounter Date: 01/22/2021   PT End of Session - 01/22/21 1320    Visit Number 15    Number of Visits 19    Date for PT Re-Evaluation 02/04/21    Authorization Type MCD of [place]    Authorization Time Period 01/06/2021 - 02/16/2021    Authorization - Visit Number 1    Authorization - Number of Visits 6    PT Start Time 1237   pt arrived late   PT Stop Time 1316    PT Time Calculation (min) 39 min    Activity Tolerance Patient tolerated treatment well    Behavior During Therapy [facility] for tasks assessed/performed           Past Medical History:  Diagnosis Date  . Anemia    PRIOR HISTORY  . Anxiety   . Asthma   . Bipolar disorder (HCC)    Borderline  . Depression   . Fibromyalgia   . Headache    MIGRAINES  . Hypothyroidism 2009   TOOK MEDS FOR FEW WEEKS NO MEDS NOW  . Migraine   . Thyroid disease    treated in past not sure if too high or too low    Past Surgical History:  Procedure Laterality Date  . COLONOSCOPY    . DILATION AND EVACUATION N/A 05/06/2016   Procedure: DILATATION AND EVACUATION;  Surgeon: Huel Cote, MD;  Location: [facility];  Service: Gynecology;  Laterality: N/A;  . FOOT SURGERY    . TEETH PULLED  03/2016  . WISDOM TOOTH EXTRACTION      There were no vitals filed for this visit.   Subjective Assessment - 01/22/21 1240    Subjective " I am having increased pain in the R shoulder pain for the last 2 months with no specific reason. I do go to get botox on the 3rd.    Patient Stated Goals decrease tension & migraines    Currently in Pain? Yes              [facility] PT Assessment - 01/22/21 0001      Assessment   Medical Diagnosis cervicalgia, migraines     Referring Provider (PT) Shawnie Dapper, NP                         [facility] Adult PT Treatment/Exercise - 01/22/21 0001      Self-Care   Self-Care Posture    Posture sitting posture to avoid excessive sacral sitting and trunk flexion, utilzing pelvic tilt to promote efficient posture      Manual Therapy   Manual Therapy Other (comment)    Manual therapy comments skilled palpation and monitoring during TPDN    Joint Mobilization rt first rib mobilization, rt rib cage ER, thoracic PA with multiple cavitations noted    Soft tissue mobilization Rt upper trap, levator, mid trap, rhomboids    Other Manual Therapy upper trap inhibition taping R only            Trigger Point Dry Needling - 01/22/21 0001    Consent Given? Yes    Education Handout Provided Previously provided    Electrical Stimulation Performed with Dry Needling Yes    E-stim with Dry  Needling Details for upper trap/ cervical multifidi CPS 20, x 10 min frequency to tolerance adjusting PRN    Upper Trapezius Response Twitch reponse elicited;Palpable increased muscle length   R only   Suboccipitals Response Twitch response elicited;Palpable increased muscle length    Cervical multifidi Response Palpable increased muscle length;Twitch reponse elicited   R only                 PT Short Term Goals - 12/24/20 1259      PT SHORT TERM GOAL #1   Title Cervical rotation within 5 deg Rt to Lt to decrease unequal strain    Baseline right 50 left 70    Time 4    Period Weeks    Status On-going    Target Date 12/24/20      PT SHORT TERM GOAL #2   Title will verbalize performing HEP at least every other day    Baseline is becoming more consitent    Time 4    Period Weeks    Status On-going      PT SHORT TERM GOAL #3   Title GHJ ER strength to 5/5    Baseline 4+/5 bilateral ER    Time 4    Period Weeks    Status On-going    Target Date 12/24/20             PT Long Term Goals - 12/24/20 1302       PT LONG TERM GOAL #1   Title pt will be able to drive comfortably without limitation by neck pain    Baseline improving but still painful    Time 6    Period Weeks    Status On-going      PT LONG TERM GOAL #2   Title HA/migraines to =<3 days/week    Baseline continues to have a migrane every few days    Time 6    Period Weeks    Status On-going      PT LONG TERM GOAL #3   Title Average pain with ADLs <=4/10    Baseline average 6-7/10 with spikes    Time 6    Period Weeks    Status On-going                 Plan - 01/22/21 1322    Clinical Impression Statement pt reports continued R shoulder/ neck pain that has been consistency for 2 months which she notes no particular reason. Continued TPDN for the R upper trap/ cervical multifidi combined with E-stim followed with IASTM techniques and thoracic mobs. took time to review efficient posture in sitting to reduce abnormal strain on the neck/ shoulders. end sessionshe noted feeling sore which is likely related to the TPDN, but she felt some relief of tension.    PT Treatment/Interventions ADLs/Self Care Home Management;Cryotherapy;Electrical Stimulation;Traction;Moist Heat;Iontophoresis 4mg /ml Dexamethasone;Therapeutic activities;Therapeutic exercise;Patient/family education;Neuromuscular re-education;Manual techniques;Taping;Dry needling;Joint Manipulations;Spinal Manipulations    PT Next Visit Plan continue with trigger point dry needling. continue to work on and Agree with Plan of Care Patient           Patient will benefit from skilled therapeutic intervention in order to improve the following deficits and impairments:  Improper body mechanics,Pain,Postural dysfunction,Increased muscle spasms,Decreased activity tolerance,Decreased strength,Impaired UE functional use,Hypermobility  Visit Diagnosis: Migraine without aura and with status migrainosus, not intractable  Cervicalgia  Abnormal  posture     Problem List Patient Active Problem List   Diagnosis  Date Noted  . Splenic vein thrombosis   . Alcohol use disorder, moderate, dependence (HCC)   . Constipation   . Alcohol-induced acute pancreatitis   . Alcohol withdrawal syndrome without complication (HCC)   . Hyponatremia   . Pain of upper abdomen 10/28/2020  . Tinea manuum 05/21/2019  . Abdominal wall lump 02/21/2018  . Chronic fatigue syndrome with fibromyalgia 02/24/2017  . Skin lesion of left leg 02/10/2017  . History of abnormal cervical Pap smear 02/10/2017  . Anxiety 10/15/2014  . Depression 10/15/2014  . Migraine 10/15/2014  . Chronic pain 10/15/2014  . Low TSH level 10/15/2014  . Tobacco use disorder 10/15/2014   Lulu Riding PT, DPT, LAT, ATC  01/22/21  1:26 PM      [facility] Outpatient Rehabilitation [facility] [address] [place], [place], [address] Phone: [phone]   Fax:  [phone]  Name: Gail Blackburn MRN: [id number] Date of Birth: 09-Feb-1976
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
## 2021-01-26 ENCOUNTER — Encounter: Payer: Self-pay | Admitting: Family Medicine

## 2021-01-26 ENCOUNTER — Telehealth: Payer: Self-pay | Admitting: Family Medicine

## 2021-01-26 ENCOUNTER — Ambulatory Visit: Payer: Medicaid Other | Admitting: Family Medicine

## 2021-01-26 DIAGNOSIS — G43709 Chronic migraine without aura, not intractable, without status migrainosus: Secondary | ICD-10-CM

## 2021-01-26 MED ORDER — PROPRANOLOL HCL ER 60 MG PO CP24: 60.0000 mg | ORAL_CAPSULE | Freq: Every day | ORAL | 3 refills | Status: DC

## 2021-01-26 MED ORDER — RIZATRIPTAN BENZOATE 10 MG PO TBDP: ORAL_TABLET | ORAL | 11 refills | Status: DC

## 2021-01-26 MED ORDER — EMGALITY 120 MG/ML ~~LOC~~ SOAJ
120.0000 mg | SUBCUTANEOUS | 11 refills | Status: DC
Start: 2021-01-26 — End: 2022-01-26

## 2021-01-26 NOTE — Telephone Encounter (Signed)
When checking out patient asked for her emgality and propranolol to be refilled please.

## 2021-01-26 NOTE — Progress Notes (Signed)
Botox- 100 units x 2 vial Lot: [id number] Expiration: 9/23 NDC: 4492-0100-71  Bacteriostatic 0.9% Sodium Chloride- 36mL total [id number] Expiration: 2/23 NDC: 49826-415-83  Dx: E94.076 SP

## 2021-02-02 ENCOUNTER — Other Ambulatory Visit: Payer: Self-pay

## 2021-02-02 ENCOUNTER — Encounter: Payer: Self-pay | Admitting: Physical Therapy

## 2021-02-02 ENCOUNTER — Ambulatory Visit: Payer: Medicaid Other | Attending: Family Medicine | Admitting: Physical Therapy

## 2021-02-02 DIAGNOSIS — M542 Cervicalgia: Secondary | ICD-10-CM | POA: Insufficient documentation

## 2021-02-02 DIAGNOSIS — M62838 Other muscle spasm: Secondary | ICD-10-CM | POA: Insufficient documentation

## 2021-02-02 DIAGNOSIS — G43001 Migraine without aura, not intractable, with status migrainosus: Secondary | ICD-10-CM | POA: Diagnosis not present

## 2021-02-02 DIAGNOSIS — R252 Cramp and spasm: Secondary | ICD-10-CM | POA: Insufficient documentation

## 2021-02-02 DIAGNOSIS — R293 Abnormal posture: Secondary | ICD-10-CM | POA: Diagnosis not present

## 2021-02-02 DIAGNOSIS — R279 Unspecified lack of coordination: Secondary | ICD-10-CM | POA: Diagnosis not present

## 2021-02-02 DIAGNOSIS — M6281 Muscle weakness (generalized): Secondary | ICD-10-CM | POA: Insufficient documentation

## 2021-02-02 DIAGNOSIS — Z79891 Long term (current) use of opiate analgesic: Secondary | ICD-10-CM | POA: Diagnosis not present

## 2021-02-02 NOTE — Therapy (Signed)
[facility] [address] [place], [place], [address] Phone: [phone]   Fax:  [phone]  Physical Therapy Treatment  Patient Details  Name: Gail Blackburn MRN: [id number] Date of Birth: [birth year] Referring Provider (PT): Shawnie Dapper, NP   Encounter Date: 02/02/2021   PT End of Session - 02/02/21 1117    Visit Number 16    Number of Visits 19    Date for PT Re-Evaluation 02/04/21    Authorization Type MCD of     Authorization Time Period 01/06/2021 - 02/16/2021    PT Start Time 1200    PT Stop Time 1155    PT Time Calculation (min) 1435 min    Activity Tolerance Patient tolerated treatment well    Behavior During Therapy [facility] for tasks assessed/performed           Past Medical History:  Diagnosis Date  . Anemia    PRIOR HISTORY  . Anxiety   . Asthma   . Bipolar disorder (HCC)    Borderline  . Depression   . Fibromyalgia   . Headache    MIGRAINES  . Hypothyroidism 2009   TOOK MEDS FOR FEW WEEKS NO MEDS NOW  . Migraine   . Thyroid disease    treated in past not sure if too high or too low    Past Surgical History:  Procedure Laterality Date  . COLONOSCOPY    . DILATION AND EVACUATION N/A 05/06/2016   Procedure: DILATATION AND EVACUATION;  Surgeon: Huel Cote, MD;  Location: [facility];  Service: Gynecology;  Laterality: N/A;  . FOOT SURGERY    . TEETH PULLED  03/2016  . WISDOM TOOTH EXTRACTION      There were no vitals filed for this visit.   Subjective Assessment - 02/02/21 1110    Subjective I have had 4 migraines since I saw the last PT. some are more severe than others.  I got Botox on the 3rd and I do get better and i recieved my shot that also helps.  I work out at a friends gym and I do my theraband exercises.  It helps my anxiety to go to a less populated gym.  I have a pain in my head ( left occipital) I dont leave the house much.   Hard to turn my head in order to drive is difficult. i bought a  theracane and tennis ball. My neurologist thinks I need an MRI    Pertinent History anxiety, bi-poalr, migranes, fibromyalgia,    Patient Stated Goals decrease tension & migraines    Currently in Pain? Yes    Pain Score 6     Pain Location Neck    Pain Orientation Left    Pain Descriptors / Indicators Aching;Sharp    Pain Type Chronic pain    Pain Onset More than a month ago    Pain Frequency Constant              [facility] PT Assessment - 02/02/21 0001      Assessment   Medical Diagnosis cervicalgia, migraines    Referring Provider (PT) Amy Lomax, NP      AROM   Cervical Flexion 40    Cervical Extension 38    Cervical - Right Rotation 60    Cervical - Left Rotation 70                         [facility] Adult PT Treatment/Exercise - 02/02/21  0001      Shoulder Exercises: Standing   Other Standing Exercises scap retraction red 2x10; shoulder extnesion 2x10 red , towel asssited distraction and rotation by pt guided VC 3 reps and 10 sec hold    Other Standing Exercises star pattern with green t band x 10 reps      Modalities   Modalities Moist Heat      Moist Heat Therapy   Number Minutes Moist Heat 10 Minutes    Moist Heat Location Cervical   left upper trap and R thomboid upper trap     Manual Therapy   Manual Therapy Joint mobilization;Soft tissue mobilization    Manual therapy comments skilled palpation and monitoring during TPDN  overpressure with cervical Rotation R and L    Joint Mobilization rt first rib mobilization, C-2-C-5 r UPA    Soft tissue mobilization bil upper trap, levator, mid trap, rhomboids bil cervical paraspinals    Manual Traction sub-occipital release            Trigger Point Dry Needling - 02/02/21 0001    Consent Given? Yes    Education Handout Provided Previously provided    Muscles Treated Head and Neck Suboccipitals;Oblique capitus;Scalenes    Electrical Stimulation Performed with Dry Needling Yes    E-stim with Dry Needling  Details for upper trap/ cervical multifidi CPS 20, x 10 min frequency to tolerance adjusting PRN    Other Dry Needling 20 pps to pt tol on R upper trap    Upper Trapezius Response Twitch reponse elicited;Palpable increased muscle length   bil   Oblique Capitus Response Twitch response elicited;Palpable increased muscle length   bil   Suboccipitals Response Twitch response elicited;Palpable increased muscle length   L > R   Levator Scapulae Response Twitch response elicited;Palpable increased muscle length   bil   Cervical multifidi Response Palpable increased muscle length   C-2-5 R only   Rhomboids Response Palpable increased muscle length;Twitch response elicited    Subscapularis Response Palpable increased muscle length                PT Education - 02/02/21 1151    Education Details added star pattern horiz abd and diagonals to HEP  verbally,    Person(s) Educated Patient    Methods Explanation;Demonstration;Verbal cues    Comprehension Verbalized understanding;Returned demonstration            PT Short Term Goals - 02/02/21 1206      PT SHORT TERM GOAL #1   Title Cervical rotation within 5 deg Rt to Lt to decrease unequal strain    Baseline right 60 left 70      PT SHORT TERM GOAL #2   Title will verbalize performing HEP at least every other day    Baseline Pt reports consistency with HEP      PT SHORT TERM GOAL #3   Title GHJ ER strength to 5/5    Period Weeks    Status Unable to assess             PT Long Term Goals - 02/02/21 1115      PT LONG TERM GOAL #1   Title pt will be able to drive comfortably without limitation by neck pain    Baseline improving but still painful 6/10 pain and Left occipital    Time 6    Period Weeks    Status On-going      PT LONG TERM GOAL #2   Title  HA/migraines to =<3 days/week    Baseline had 4 migraines last week    Time 6    Period Weeks    Status On-going      PT LONG TERM GOAL #3   Time 6    Period Weeks     Status On-going                 Plan - 02/02/21 1105    Clinical Impression Statement Ms Soliman reports 4 migraines in last week and requests and consents to [facility] and added estim to R upper trap.   Pt is closely moniitored throughout session and inially reports 6/10 pain.  Pt reports 4/10 post Session.  but " pinching" at right suboccipital still present even after manual therapy.  Pt is to call MD about scheduling MRI for 3 months of symptoms with continuing pain and migraines.  Pt with slightly improved AROM with cervical Rotation towards symmetry. will continue POC    Personal Factors and Comorbidities Comorbidity 1;Time since onset of injury/illness/exacerbation    Comorbidities fibromyalgia, high stress levels due to home environment    Examination-Activity Limitations Bathing;Reach Overhead;Sit;Caring for Others;Carry;Sleep;Lift    Examination-Participation Restrictions Cleaning;Meal Prep;Community Activity;Driving;Laundry    PT Treatment/Interventions ADLs/Self Care Home Management;Cryotherapy;Electrical Stimulation;Traction;Moist Heat;Iontophoresis 4mg /ml Dexamethasone;Therapeutic activities;Therapeutic exercise;Patient/family education;Neuromuscular re-education;Manual techniques;Taping;Dry needling;Joint Manipulations;Spinal Manipulations    PT Next Visit Plan continue with trigger point dry needling. continue to work on ROM. Recert next visit.  MCD trhough 02-16-21    PT Home Exercise Plan from last episode    Consulted and Agree with Plan of Care Patient           Patient will benefit from skilled therapeutic intervention in order to improve the following deficits and impairments:  Improper body mechanics,Pain,Postural dysfunction,Increased muscle spasms,Decreased activity tolerance,Decreased strength,Impaired UE functional use,Hypermobility  Visit Diagnosis: Migraine without aura and with status migrainosus, not intractable  Cervicalgia  Abnormal posture  Muscle weakness  (generalized)  Cramp and spasm  Unspecified lack of coordination  Other muscle spasm     Problem List Patient Active Problem List   Diagnosis Date Noted  . Splenic vein thrombosis   . Alcohol use disorder, moderate, dependence (HCC)   . Constipation   . Alcohol-induced acute pancreatitis   . Alcohol withdrawal syndrome without complication (HCC)   . Hyponatremia   . Pain of upper abdomen 10/28/2020  . Tinea manuum 05/21/2019  . Abdominal wall lump 02/21/2018  . Chronic fatigue syndrome with fibromyalgia 02/24/2017  . Skin lesion of left leg 02/10/2017  . History of abnormal cervical Pap smear 02/10/2017  . Anxiety 10/15/2014  . Depression 10/15/2014  . Migraine 10/15/2014  . Chronic pain 10/15/2014  . Low TSH level 10/15/2014  . Tobacco use disorder 10/15/2014   10/17/2014, PT, [employer] Certified Exercise Expert for the Aging Adult  02/02/21 12:11 PM Phone: [phone] Fax: [phone]  [facility] [address] [place], [place], [place] Phone: [phone]   Fax:  [phone]  Name: Chanci Ojala MRN: Angeline Slim Date of Birth: May 24, 1976

## 2021-02-05 ENCOUNTER — Other Ambulatory Visit: Payer: Self-pay | Admitting: Student in an Organized Health Care Education/Training Program

## 2021-02-05 DIAGNOSIS — G43709 Chronic migraine without aura, not intractable, without status migrainosus: Secondary | ICD-10-CM

## 2021-02-10 ENCOUNTER — Ambulatory Visit: Payer: Medicaid Other | Admitting: Physical Therapy

## 2021-02-10 ENCOUNTER — Other Ambulatory Visit: Payer: Self-pay

## 2021-02-10 ENCOUNTER — Encounter: Payer: Self-pay | Admitting: Physical Therapy

## 2021-02-10 DIAGNOSIS — M6281 Muscle weakness (generalized): Secondary | ICD-10-CM | POA: Diagnosis not present

## 2021-02-10 DIAGNOSIS — R252 Cramp and spasm: Secondary | ICD-10-CM | POA: Diagnosis not present

## 2021-02-10 DIAGNOSIS — M542 Cervicalgia: Secondary | ICD-10-CM

## 2021-02-10 DIAGNOSIS — R279 Unspecified lack of coordination: Secondary | ICD-10-CM | POA: Diagnosis not present

## 2021-02-10 DIAGNOSIS — M62838 Other muscle spasm: Secondary | ICD-10-CM | POA: Diagnosis not present

## 2021-02-10 DIAGNOSIS — G43001 Migraine without aura, not intractable, with status migrainosus: Secondary | ICD-10-CM

## 2021-02-10 DIAGNOSIS — R293 Abnormal posture: Secondary | ICD-10-CM

## 2021-02-10 NOTE — Therapy (Signed)
[facility] [address] [place], [place], [address] Phone: [phone]   Fax:  [phone]  Physical Therapy Treatment / Re-certification  Patient Details  Name: Gail Blackburn MRN: [id number] Date of Birth: [birth year] Referring Provider (PT): Shawnie Dapper, NP   Encounter Date: 02/10/2021   PT End of Session - 02/10/21 0930    Visit Number 17    Number of Visits 19    Date for PT Re-Evaluation 03/17/21    Authorization Type MCD of [place]    Authorization Time Period 01/06/2021 - 02/16/2021    Authorization - Visit Number 4    Authorization - Number of Visits 6    PT Start Time 0931    PT Stop Time 1017    PT Time Calculation (min) 46 min           Past Medical History:  Diagnosis Date  . Anemia    PRIOR HISTORY  . Anxiety   . Asthma   . Bipolar disorder (HCC)    Borderline  . Depression   . Fibromyalgia   . Headache    MIGRAINES  . Hypothyroidism 2009   TOOK MEDS FOR FEW WEEKS NO MEDS NOW  . Migraine   . Thyroid disease    treated in past not sure if too high or too low    Past Surgical History:  Procedure Laterality Date  . COLONOSCOPY    . DILATION AND EVACUATION N/A 05/06/2016   Procedure: DILATATION AND EVACUATION;  Surgeon: Huel Cote, MD;  Location: [facility];  Service: Gynecology;  Laterality: N/A;  . FOOT SURGERY    . TEETH PULLED  03/2016  . WISDOM TOOTH EXTRACTION      There were no vitals filed for this visit.   Subjective Assessment - 02/10/21 0932    Subjective " I am still hurting really bad since the last session. After leaving the last session I had a severe migraine which resulting in my throwing up. my neurologist recommending an MRI and to go to my PCP to get an order for further imaging."    Patient Stated Goals decrease tension & migraines    Currently in Pain? Yes    Pain Score 5     Pain Location Neck    Pain Orientation Left    Pain Descriptors / Indicators Aching;Sharp    Pain Type  Chronic pain    Pain Onset More than a month ago    Aggravating Factors  stress    Pain Relieving Factors dry needling, massage.              [facility] PT Assessment - 02/10/21 0001      Assessment   Medical Diagnosis cervicalgia, migraines    Referring Provider (PT) Amy Lomax, NP      AROM   Cervical Flexion 20    Cervical Extension 30    Cervical - Right Side Bend 22    Cervical - Left Side Bend 24    Cervical - Right Rotation 51    Cervical - Left Rotation 56      Strength   Right/Left Shoulder Left;Right    Right Shoulder External Rotation 4+/5    Left Shoulder External Rotation 4-/5                         [facility] Adult PT Treatment/Exercise - 02/10/21 0001      Shoulder Exercises: Prone   Retraction Strengthening;Both;12 reps  with shoulder extension     Traction   Type of Traction Cervical    Min (lbs) 10    Max (lbs) 18    Hold Time 60s    Rest Time 20s    Time 11 min      Manual Therapy   Manual therapy comments skilled palpation and monitoring during TPDN  overpressure with cervical Rotation R and L    Joint Mobilization rt first rib mobilization, C-2-C-5 r UPA, R C2-C5 gapping mobs            Trigger Point Dry Needling - 02/10/21 0001    Consent Given? Yes    Education Handout Provided Previously provided    Suboccipitals Response Twitch response elicited;Palpable increased muscle length    Cervical multifidi Response Twitch reponse elicited;Palpable increased muscle length   C3-C7 R only               PT Education - 02/10/21 1011    Education Details reviewed HEP    Person(s) Educated Patient    Methods Explanation;Verbal cues    Comprehension Verbalized understanding;Verbal cues required            PT Short Term Goals - 02/10/21 0937      PT SHORT TERM GOAL #1   Title Cervical rotation within 5 deg Rt to Lt to decrease unequal strain    Baseline regressed compared to previous measures    Period Weeks    Status  On-going      PT SHORT TERM GOAL #2   Title will verbalize performing HEP at least every other day    Period Weeks    Status Achieved      PT SHORT TERM GOAL #3   Title GHJ ER strength to 5/5    Baseline 4-/5 on the L, 4+/5 on the R    Period Weeks    Status On-going             PT Long Term Goals - 02/10/21 3893      PT LONG TERM GOAL #1   Title pt will be able to drive comfortably without limitation by neck pain    Baseline improving but still painful 6/10 pain and Left occipital    Period Weeks    Status On-going      PT LONG TERM GOAL #2   Title HA/migraines to =<3 days/week    Baseline 2 in the last week, but 2 weeks had 4 migraines    Period Weeks    Status On-going      PT LONG TERM GOAL #3   Title Average pain with ADLs <=4/10    Baseline average 6-7/10 with spikes    Period Weeks    Status On-going      PT LONG TERM GOAL #4   Title -    Baseline -      PT LONG TERM GOAL #5   Title -    Baseline -                 Plan - 02/10/21 1005    Clinical Impression Statement [name] continues to report intermittent neck / shoulder pain with occurence of migraines that occurs multiple times a week. limited progress has been made toward her goals but notes some improvement with exericse. Continued TPDN today focusing on the R sub-occipital and cervical multidifi followed with cervical mobs and posterior chain strengthening. revisited mechanical cervical traciton increasing pull. discussed with pt if she  continues to have limited funcitonal progression over the next few visits then we will have to refer back to MD for further assessment which she verbalized understanding.    PT Frequency 1x / week    PT Duration 4 weeks    PT Treatment/Interventions ADLs/Self Care Home Management;Cryotherapy;Electrical Stimulation;Traction;Moist Heat;Iontophoresis 4mg /ml Dexamethasone;Therapeutic activities;Therapeutic exercise;Patient/family education;Neuromuscular  re-education;Manual techniques;Taping;Dry needling;Joint Manipulations;Spinal Manipulations    PT Next Visit Plan continue with trigger point dry needling. continue to work on ROM. Recert next visit.  MCD trhough 02-16-21    PT Home Exercise Plan from last episode    Consulted and Agree with Plan of Care Patient           Patient will benefit from skilled therapeutic intervention in order to improve the following deficits and impairments:  Improper body mechanics,Pain,Postural dysfunction,Increased muscle spasms,Decreased activity tolerance,Decreased strength,Impaired UE functional use,Hypermobility  Visit Diagnosis: Migraine without aura and with status migrainosus, not intractable  Cervicalgia  Abnormal posture  Muscle weakness (generalized)  Cramp and spasm  Unspecified lack of coordination  Other muscle spasm     Problem List Patient Active Problem List   Diagnosis Date Noted  . Splenic vein thrombosis   . Alcohol use disorder, moderate, dependence (HCC)   . Constipation   . Alcohol-induced acute pancreatitis   . Alcohol withdrawal syndrome without complication (HCC)   . Hyponatremia   . Pain of upper abdomen 10/28/2020  . Tinea manuum 05/21/2019  . Abdominal wall lump 02/21/2018  . Chronic fatigue syndrome with fibromyalgia 02/24/2017  . Skin lesion of left leg 02/10/2017  . History of abnormal cervical Pap smear 02/10/2017  . Anxiety 10/15/2014  . Depression 10/15/2014  . Migraine 10/15/2014  . Chronic pain 10/15/2014  . Low TSH level 10/15/2014  . Tobacco use disorder 10/15/2014   10/17/2014 PT, DPT, LAT, ATC  02/10/21  10:24 AM      [facility] [address] [place], [place], [place] Phone: [phone]   Fax:  [phone]  Name: Gail Blackburn MRN: Angeline Slim Date of Birth: 1976-07-13

## 2021-02-17 ENCOUNTER — Other Ambulatory Visit: Payer: Self-pay

## 2021-02-17 ENCOUNTER — Encounter: Payer: Self-pay | Admitting: Physical Therapy

## 2021-02-17 ENCOUNTER — Ambulatory Visit: Payer: Medicaid Other | Admitting: Physical Therapy

## 2021-02-17 DIAGNOSIS — M6281 Muscle weakness (generalized): Secondary | ICD-10-CM

## 2021-02-17 DIAGNOSIS — M62838 Other muscle spasm: Secondary | ICD-10-CM | POA: Diagnosis not present

## 2021-02-17 DIAGNOSIS — R279 Unspecified lack of coordination: Secondary | ICD-10-CM | POA: Diagnosis not present

## 2021-02-17 DIAGNOSIS — R252 Cramp and spasm: Secondary | ICD-10-CM

## 2021-02-17 DIAGNOSIS — G43001 Migraine without aura, not intractable, with status migrainosus: Secondary | ICD-10-CM

## 2021-02-17 DIAGNOSIS — M542 Cervicalgia: Secondary | ICD-10-CM | POA: Diagnosis not present

## 2021-02-17 DIAGNOSIS — R293 Abnormal posture: Secondary | ICD-10-CM | POA: Diagnosis not present

## 2021-02-17 NOTE — Therapy (Signed)
[facility] [address] [place], [place], [address] Phone: [phone]   Fax:  [phone]  Physical Therapy Treatment / Re-evaluation  Patient Details  Name: Gail Blackburn MRN: [id number] Date of Birth: 1976/09/12 Referring Provider (PT): Shawnie Dapper, NP   Encounter Date: 02/17/2021   PT End of Session - 02/17/21 0934    Visit Number 18    Number of Visits 22    Date for PT Re-Evaluation 03/17/21    Authorization Type MCD of [place]    Authorization Time Period 01/06/2021 - 02/16/2021  (resubmitted on 02/17/2021)    Authorization - Visit Number 4    Authorization - Number of Visits 6    PT Start Time 0934    PT Stop Time 1015    PT Time Calculation (min) 41 min    Activity Tolerance Patient tolerated treatment well    Behavior During Therapy [facility] for tasks assessed/performed           Past Medical History:  Diagnosis Date  . Anemia    PRIOR HISTORY  . Anxiety   . Asthma   . Bipolar disorder (HCC)    Borderline  . Depression   . Fibromyalgia   . Headache    MIGRAINES  . Hypothyroidism 2009   TOOK MEDS FOR FEW WEEKS NO MEDS NOW  . Migraine   . Thyroid disease    treated in past not sure if too high or too low    Past Surgical History:  Procedure Laterality Date  . COLONOSCOPY    . DILATION AND EVACUATION N/A 05/06/2016   Procedure: DILATATION AND EVACUATION;  Surgeon: Huel Cote, MD;  Location: [facility];  Service: Gynecology;  Laterality: N/A;  . FOOT SURGERY    . TEETH PULLED  03/2016  . WISDOM TOOTH EXTRACTION      There were no vitals filed for this visit.   Subjective Assessment - 02/17/21 0937    Subjective Gail Blackburn reports that she used a theracane yesterday and she is sore today from using that.  She has been having the pain in the back of neck and she is trying to get into see a specialist.  She did tolerate the traction last session.  She had two bad migraines since last session in therapy.    Currently  in Pain? Yes    Pain Score 5     Pain Location Neck    Pain Orientation Left    Aggravating Factors  stress, any activiity    Pain Relieving Factors dry needling, massage              [facility] PT Assessment - 02/17/21 0001      Assessment   Medical Diagnosis cervicalgia, migraines    Referring Provider (PT) Amy Lomax, NP      AROM   Cervical Flexion 20    Cervical Extension 30    Cervical - Right Side Bend 22    Cervical - Left Side Bend 24    Cervical - Right Rotation 51    Cervical - Left Rotation 56      Strength   Right Shoulder External Rotation 4+/5    Left Shoulder External Rotation 4-/5                           Trigger Point Dry Needling - 02/17/21 0001    Consent Given? Yes    Education Handout Provided Previously provided  Electrical Stimulation Performed with Dry Needling Yes    Other Dry Needling CPS 20, for bil cervical paraspinals adjusting intensity PRN    Upper Trapezius Response Twitch reponse elicited;Palpable increased muscle length   bil   Levator Scapulae Response Twitch response elicited;Palpable increased muscle length   bil   Cervical multifidi Response Twitch reponse elicited;Palpable increased muscle length   bil                 PT Short Term Goals - 02/17/21 0945      PT SHORT TERM GOAL #1   Title Cervical rotation within 5 deg Rt to Lt to decrease unequal strain    Baseline regressed compared to previous measures    Period Weeks    Status On-going    Target Date 03/31/21      PT SHORT TERM GOAL #2   Title will verbalize performing HEP at least every other day    Baseline Pt reports consistency with HEP but does her HEP a couple times a week    Period Weeks    Status Achieved      PT SHORT TERM GOAL #3   Title GHJ ER strength to 5/5    Baseline 4-/5 on the L, 4+/5 on the R    Period Weeks    Status On-going    Target Date 03/31/21             PT Long Term Goals - 02/17/21 0945      PT LONG TERM  GOAL #1   Title pt will be able to drive comfortably without limitation by neck pain    Baseline improving but still painful 6/10 pain and Left occipital    Time 6    Period Weeks    Status On-going    Target Date 03/31/21      PT LONG TERM GOAL #2   Title HA/migraines to =<3 days/week    Baseline 2 in the last week, but 2 weeks had 4 migraines    Period Weeks    Status Achieved      PT LONG TERM GOAL #3   Title Average pain with ADLs <=4/10    Baseline average 6-7/10 with spikes    Time 6    Period Weeks    Status On-going    Target Date 03/31/21                 Plan - 02/17/21 0956    Clinical Impression Statement Gail Blackburn continues to report intermittent neck / shoulder pain with occurence of migraines that occurs multiple times a week. limited progress has been made toward her goals but notes some improvement with exericse. TPDN was perofmred on bil upper trap, levator scapulae, cervical paraspinals, sub-occipitals combined with E-stim. pt is hightly motivatated to continue with treatment for functional improvement and relief of pain and reduce frequency of HA. She would benefit from physical therapy to relieve muscle tension, reduce HA frequency, promote cervical ROM, improve efficient posture and general strength maximizing her function by addressing the deficits listed to work toward independent exercise. discussed with pt if no functional or measureable progress is made over the course of the next 4 visits then plan to discharged her from PT back to her MD which she verbalized understanding.    Rehab Potential Good    PT Frequency 1x / week    PT Duration 4 weeks    PT Treatment/Interventions ADLs/Self Care Home Management;Cryotherapy;Electrical Stimulation;Traction;Moist Heat;Iontophoresis 4mg /ml Dexamethasone;Therapeutic  activities;Therapeutic exercise;Patient/family education;Neuromuscular re-education;Manual techniques;Taping;Dry needling;Joint Manipulations;Spinal  Manipulations    PT Next Visit Plan continue with trigger point dry needling. continue to work on ROM. traction with increaesd pull? posterior shoulder strengthening, updated HEP    Consulted and Agree with Plan of Care Patient           Patient will benefit from skilled therapeutic intervention in order to improve the following deficits and impairments:  Improper body mechanics,Pain,Postural dysfunction,Increased muscle spasms,Decreased activity tolerance,Decreased strength,Impaired UE functional use,Hypermobility  Visit Diagnosis: Migraine without aura and with status migrainosus, not intractable  Cervicalgia  Abnormal posture  Muscle weakness (generalized)  Cramp and spasm     Problem List Patient Active Problem List   Diagnosis Date Noted  . Splenic vein thrombosis   . Alcohol use disorder, moderate, dependence (HCC)   . Constipation   . Alcohol-induced acute pancreatitis   . Alcohol withdrawal syndrome without complication (HCC)   . Hyponatremia   . Pain of upper abdomen 10/28/2020  . Tinea manuum 05/21/2019  . Abdominal wall lump 02/21/2018  . Chronic fatigue syndrome with fibromyalgia 02/24/2017  . Skin lesion of left leg 02/10/2017  . History of abnormal cervical Pap smear 02/10/2017  . Anxiety 10/15/2014  . Depression 10/15/2014  . Migraine 10/15/2014  . Chronic pain 10/15/2014  . Low TSH level 10/15/2014  . Tobacco use disorder 10/15/2014    Lulu Riding PT, DPT, LAT, ATC  02/17/21  10:14 AM      [facility] [address] [place], [place], [address] Phone: [phone]   Fax:  [phone]  Name: Gail Blackburn MRN: [id number] Date of Birth: [birth year]

## 2021-02-24 ENCOUNTER — Ambulatory Visit: Payer: Medicaid Other | Admitting: Physical Therapy

## 2021-03-01 ENCOUNTER — Other Ambulatory Visit: Payer: Self-pay

## 2021-03-01 ENCOUNTER — Ambulatory Visit: Payer: Medicaid Other | Attending: Family Medicine

## 2021-03-01 DIAGNOSIS — G43001 Migraine without aura, not intractable, with status migrainosus: Secondary | ICD-10-CM | POA: Diagnosis not present

## 2021-03-01 DIAGNOSIS — M542 Cervicalgia: Secondary | ICD-10-CM | POA: Diagnosis not present

## 2021-03-01 DIAGNOSIS — M6281 Muscle weakness (generalized): Secondary | ICD-10-CM | POA: Insufficient documentation

## 2021-03-01 DIAGNOSIS — R293 Abnormal posture: Secondary | ICD-10-CM | POA: Diagnosis not present

## 2021-03-01 DIAGNOSIS — R252 Cramp and spasm: Secondary | ICD-10-CM | POA: Diagnosis not present

## 2021-03-01 NOTE — Therapy (Addendum)
[facility] [address] [place], [place], [address] Phone: [phone]   Fax:  [phone]  Physical Therapy Treatment  Patient Details  Name: Gail Blackburn MRN: [id number] Date of Birth: [birth year] Referring Provider (PT): Shawnie Dapper, NP   Encounter Date: 03/01/2021   PT End of Session - 03/01/21 1439    Visit Number 19    Number of Visits 22    Date for PT Re-Evaluation 03/23/21    Authorization Type [employer] of [place]    Authorization Time Period 02/24/2021 to 03/23/2021 4 visits authorized (22 visit total)    Authorization - Visit Number 4    Progress Note Due on Visit 0.01    PT Start Time 1415    PT Stop Time 1515    PT Time Calculation (min) 60 min    Activity Tolerance Patient tolerated treatment well;No increased pain    Behavior During Therapy WFL for tasks assessed/performed           Past Medical History:  Diagnosis Date  . Anemia    PRIOR HISTORY  . Anxiety   . Asthma   . Bipolar disorder (HCC)    Borderline  . Depression   . Fibromyalgia   . Headache    MIGRAINES  . Hypothyroidism 2009   TOOK MEDS FOR FEW WEEKS NO MEDS NOW  . Migraine   . Thyroid disease    treated in past not sure if too high or too low    Past Surgical History:  Procedure Laterality Date  . COLONOSCOPY    . DILATION AND EVACUATION N/A 05/06/2016   Procedure: DILATATION AND EVACUATION;  Surgeon: Huel Cote, MD;  Location: [facility];  Service: Gynecology;  Laterality: N/A;  . FOOT SURGERY    . TEETH PULLED  03/2016  . WISDOM TOOTH EXTRACTION      There were no vitals filed for this visit.   Subjective Assessment - 03/01/21 1416    Subjective Venetia reports that her migranes are less often.  She only had one severe migrane since last session in therapy.    Pertinent History anxiety, bi-poalr, migranes, fibromyalgia,    Currently in Pain? Yes    Pain Score 4     Pain Location Neck    Pain Orientation Right;Left    Pain Descriptors  / Indicators Aching;Cl[facility] Boll Adult PT Treatment/Exercise - 03/01/21 0001      Shoulder Exercises: Standing   Other Standing Exercises star pattern with green t band x 10 reps      Traction   Type of Traction Cervical    Min (lbs) 15    Max (lbs) 24    Hold Time 60s    Rest Time 20s    Time 12 min      Manual Therapy   Manual therapy comments skilled palpation and monitoring during TPDN  overpressure with cervical Rotation R and L    Manual Traction sub-occipital release            Trigger Point Dry Needling - 03/01/21 0001    Consent Given? Yes    Education Handout Provided Previously provided    Electrical Stimulation Performed with Dry Needling Yes    E-stim with Dry Needling Details 10 min    Other Dry Needling CPS 20, for bil cervical paraspinals adjusting intensity  PRN    Upper Trapezius Response Palpable increased muscle length    Levator Scapulae Response Palpable increased muscle length    Cervical multifidi Response Palpable increased muscle length                  PT Short Term Goals - 02/17/21 0945      PT SHORT TERM GOAL #1   Title Cervical rotation within 5 deg Rt to Lt to decrease unequal strain    Baseline regressed compared to previous measures    Period Weeks    Status On-going    Target Date 03/31/21      PT SHORT TERM GOAL #2   Title will verbalize performing HEP at least every other day    Baseline Pt reports consistency with HEP but does her HEP a couple times a week    Period Weeks    Status Achieved      PT SHORT TERM GOAL #3   Title GHJ ER strength to 5/5    Baseline 4-/5 on the L, 4+/5 on the R    Period Weeks    Status On-going    Target Date 03/31/21             PT Long Term Goals - 02/17/21 0945      PT LONG TERM GOAL #1   Title pt will be able to drive comfortably without limitation by neck pain    Baseline improving but still painful 6/10 pain and Left occipital     Time 6    Period Weeks    Status On-going    Target Date 03/31/21      PT LONG TERM GOAL #2   Title HA/migraines to =<3 days/week    Baseline 2 in the last week, but 2 weeks had 4 migraines    Period Weeks    Status Achieved      PT LONG TERM GOAL #3   Title Average pain with ADLs <=4/10    Baseline average 6-7/10 with spikes    Time 6    Period Weeks    Status On-going    Target Date 03/31/21                 Plan - 03/01/21 1502    Clinical Impression Statement Shalay reports that her migranes are getting less with therapy.  She only had one severe migrane since her last appointment which was 1 week ago.  Continued with FDN of the UTs, levator scapula, and cervical paraspinals.  E-stim was used on the cervical paraspinals today with increase of intensity progression throughout the 10 minutes today.  Resumed mechanical cervical traction with a maximum pull of 24 pounds.  The patient tolerated that well.  Recommend continued therapy for further reduction of mirgranes.    Personal Factors and Comorbidities Comorbidity 1;Time since onset of injury/illness/exacerbation    Comorbidities fibromyalgia, high stress levels due to home environment    Examination-Activity Limitations Bathing;Reach Overhead;Sit;Caring for Others;Carry;Sleep;Lift    Examination-Participation Restrictions Cleaning;Meal Prep;Community Activity;Driving;Laundry    PT Treatment/Interventions ADLs/Self Care Home Management;Cryotherapy;Electrical Stimulation;Traction;Moist Heat;Iontophoresis 4mg /ml Dexamethasone;Therapeutic activities;Therapeutic exercise;Patient/family education;Neuromuscular re-education;Manual techniques;Taping;Dry needling;Joint Manipulations;Spinal Manipulations    PT Next Visit Plan Assess response to incresae pulls with mechanical traction, continue with trigger point dry needling. continue to work on ROM, posterior shoulder strengthening, updated HEP    Consulted and Agree with Plan of  Care Patient           Patient will benefit from skilled  therapeutic intervention in order to improve the following deficits and impairments:  Improper body mechanics,Pain,Postural dysfunction,Increased muscle spasms,Decreased activity tolerance,Decreased strength,Impaired UE functional use,Hypermobility  Visit Diagnosis: Cervicalgia  Migraine without aura and with status migrainosus, not intractable  Cramp and spasm     Problem List Patient Active Problem List   Diagnosis Date Noted  . Splenic vein thrombosis   . Alcohol use disorder, moderate, dependence (HCC)   . Constipation   . Alcohol-induced acute pancreatitis   . Alcohol withdrawal syndrome without complication (HCC)   . Hyponatremia   . Pain of upper abdomen 10/28/2020  . Tinea manuum 05/21/2019  . Abdominal wall lump 02/21/2018  . Chronic fatigue syndrome with fibromyalgia 02/24/2017  . Skin lesion of left leg 02/10/2017  . History of abnormal cervical Pap smear 02/10/2017  . Anxiety 10/15/2014  . Depression 10/15/2014  . Migraine 10/15/2014  . Chronic pain 10/15/2014  . Low TSH level 10/15/2014  . Tobacco use disorder 10/15/2014   Sharol Roussel, PT, DPT, OCS, Crt. DN Robet Leu 03/01/2021, 3:21 PM  [facility] [address] [place], [place], [address] Phone: [phone]   Fax:  [phone]  Name: Leslye Puccini MRN: [id number] Date of Birth: 12/21/1975

## 2021-03-07 ENCOUNTER — Other Ambulatory Visit: Payer: Self-pay | Admitting: Student in an Organized Health Care Education/Training Program

## 2021-03-07 DIAGNOSIS — G43709 Chronic migraine without aura, not intractable, without status migrainosus: Secondary | ICD-10-CM

## 2021-03-08 ENCOUNTER — Other Ambulatory Visit: Payer: Self-pay

## 2021-03-08 ENCOUNTER — Ambulatory Visit: Payer: Medicaid Other

## 2021-03-08 DIAGNOSIS — M542 Cervicalgia: Secondary | ICD-10-CM

## 2021-03-08 DIAGNOSIS — G43001 Migraine without aura, not intractable, with status migrainosus: Secondary | ICD-10-CM | POA: Diagnosis not present

## 2021-03-08 DIAGNOSIS — R293 Abnormal posture: Secondary | ICD-10-CM | POA: Diagnosis not present

## 2021-03-08 DIAGNOSIS — R252 Cramp and spasm: Secondary | ICD-10-CM | POA: Diagnosis not present

## 2021-03-08 DIAGNOSIS — M6281 Muscle weakness (generalized): Secondary | ICD-10-CM | POA: Diagnosis not present

## 2021-03-08 NOTE — Therapy (Signed)
[facility] Outpatient Rehabilitation [facility] [address] [place], [place], [address] Phone: [phone]   Fax:  [phone]  Physical Therapy Treatment  Patient Details  Name: Gail Blackburn MRN: [id number] Date of Birth: 1976-05-16 Referring Provider (PT): Shawnie Dapper, NP   Encounter Date: 03/08/2021   PT End of Session - 03/08/21 1443     Visit Number 20    Number of Visits 22    Date for PT Re-Evaluation 03/23/21    Authorization Type MCD of [place]    Authorization Time Period 02/24/2021 to 03/23/2021 4 visits authorized (22 visit total)    Authorization - Number of Visits 6    PT Start Time 1418    PT Stop Time 1500    PT Time Calculation (min) 42 min    Activity Tolerance Patient tolerated treatment well;No increased pain    Behavior During Therapy WFL for tasks assessed/performed             Past Medical History:  Diagnosis Date   Anemia    PRIOR HISTORY   Anxiety    Asthma    Bipolar disorder (HCC)    Borderline   Depression    Fibromyalgia    Headache    MIGRAINES   Hypothyroidism 2009   TOOK MEDS FOR FEW WEEKS NO MEDS NOW   Migraine    Thyroid disease    treated in past not sure if too high or too low    Past Surgical History:  Procedure Laterality Date   COLONOSCOPY     DILATION AND EVACUATION N/A 05/06/2016   Procedure: DILATATION AND EVACUATION;  Surgeon: Huel Cote, MD;  Location: [facility];  Service: Gynecology;  Laterality: N/A;   FOOT SURGERY     TEETH PULLED  03/2016   WISDOM TOOTH EXTRACTION      There were no vitals filed for this visit.   Subjective Assessment - 03/08/21 1419     Subjective The patient reports no major headaches since last session.  She feels that the electrodes help.    Pertinent History anxiety, bi-poalr, migranes, fibromyalgia,    Patient Stated Goals decrease tension & migraines    Currently in Pain? Yes    Pain Score 5     Pain Location Neck    Pain Radiating Towards between the shoulder  blades                               [facility] Adult PT Treatment/Exercise - 03/08/21 0001       Manual Therapy   Manual therapy comments skilled palpation and monitoring during TPDN  overpressure with cervical Rotation R and L    Manual Traction sub-occipital release    Other Manual Therapy atlas "wiggles"              Trigger Point Dry Needling - 03/08/21 0001     Consent Given? Yes    Education Handout Provided Previously provided    Statistician Performed with Dry Needling Yes    E-stim with Dry Needling Details 10 min    Other Dry Needling CPS 20, for bil cervical paraspinals adjusting intensity PRN    Upper Trapezius Response Palpable increased muscle length    Levator Scapulae Response Palpable increased muscle length    Cervical multifidi Response Palpable increased muscle length    Rhomboids Response Palpable increased muscle length  PT Short Term Goals - 02/17/21 0945       PT SHORT TERM GOAL #1   Title Cervical rotation within 5 deg Rt to Lt to decrease unequal strain    Baseline regressed compared to previous measures    Period Weeks    Status On-going    Target Date 03/31/21      PT SHORT TERM GOAL #2   Title will verbalize performing HEP at least every other day    Baseline Pt reports consistency with HEP but does her HEP a couple times a week    Period Weeks    Status Achieved      PT SHORT TERM GOAL #3   Title GHJ ER strength to 5/5    Baseline 4-/5 on the L, 4+/5 on the R    Period Weeks    Status On-going    Target Date 03/31/21               PT Long Term Goals - 02/17/21 0945       PT LONG TERM GOAL #1   Title pt will be able to drive comfortably without limitation by neck pain    Baseline improving but still painful 6/10 pain and Left occipital    Time 6    Period Weeks    Status On-going    Target Date 03/31/21      PT LONG TERM GOAL #2   Title HA/migraines to =<3  days/week    Baseline 2 in the last week, but 2 weeks had 4 migraines    Period Weeks    Status Achieved      PT LONG TERM GOAL #3   Title Average pain with ADLs <=4/10    Baseline average 6-7/10 with spikes    Time 6    Period Weeks    Status On-going    Target Date 03/31/21                   Plan - 03/08/21 1444     Clinical Impression Statement Gail Blackburn reports that she has not had an intense headache since last session in therapy.  She did have reports of tightnes in the right shoulder and neck.  She presented today with a reduction of muscle tonicity in the upper trapezius bilaterally.  Continued today with FDN.  E-stim was performed to the cervical paraspinals bilaterally.  She tolerated that well.  The patient would like to defer the mechanical traction today as she did not feel like it did anything.  Recommend continued therapy for further reduction in headache intensity and frequency.    Personal Factors and Comorbidities Comorbidity 1;Time since onset of injury/illness/exacerbation    Comorbidities fibromyalgia, high stress levels due to home environment    Examination-Activity Limitations Bathing;Reach Overhead;Sit;Caring for Others;Carry;Sleep;Lift    Examination-Participation Restrictions Cleaning;Meal Prep;Community Activity;Driving;Laundry    PT Treatment/Interventions ADLs/Self Care Home Management;Cryotherapy;Electrical Stimulation;Traction;Moist Heat;Iontophoresis 4mg /ml Dexamethasone;Therapeutic activities;Therapeutic exercise;Patient/family education;Neuromuscular re-education;Manual techniques;Taping;Dry needling;Joint Manipulations;Spinal Manipulations    PT Next Visit Plan continue with trigger point dry needling. continue to work on ROM, posterior shoulder strengthening, updated HEP    Consulted and Agree with Plan of Care Patient             Patient will benefit from skilled therapeutic intervention in order to improve the following deficits and  impairments:  Improper body mechanics, Pain, Postural dysfunction, Increased muscle spasms, Decreased activity tolerance, Decreased strength, Impaired UE functional use, Hypermobility  Visit Diagnosis: Cervicalgia  Migraine  without aura and with status migrainosus, not intractable  Cramp and spasm     Problem List Patient Active Problem List   Diagnosis Date Noted   Splenic vein thrombosis    Alcohol use disorder, moderate, dependence (HCC)    Constipation    Alcohol-induced acute pancreatitis    Alcohol withdrawal syndrome without complication (HCC)    Hyponatremia    Pain of upper abdomen 10/28/2020   Tinea manuum 05/21/2019   Abdominal wall lump 02/21/2018   Chronic fatigue syndrome with fibromyalgia 02/24/2017   Skin lesion of left leg 02/10/2017   History of abnormal cervical Pap smear 02/10/2017   Anxiety 10/15/2014   Depression 10/15/2014   Migraine 10/15/2014   Chronic pain 10/15/2014   Low TSH level 10/15/2014   Tobacco use disorder 10/15/2014   Sharol Roussel, PT, DPT, OCS, Crt. DN  Robet Leu 03/08/2021, 3:03 PM  [facility] [address] [place], [place], [address] Phone: [phone]   Fax:  [phone]  Name: Gail Blackburn MRN: [id number] Date of Birth: 11/23/1975

## 2021-03-15 ENCOUNTER — Telehealth (HOSPITAL_COMMUNITY): Payer: Self-pay | Admitting: Licensed Clinical Social Worker

## 2021-03-16 ENCOUNTER — Ambulatory Visit: Payer: Medicaid Other | Admitting: Physical Therapy

## 2021-03-23 ENCOUNTER — Other Ambulatory Visit: Payer: Self-pay

## 2021-03-23 ENCOUNTER — Encounter: Payer: Self-pay | Admitting: Physical Therapy

## 2021-03-23 ENCOUNTER — Ambulatory Visit: Payer: Medicaid Other | Admitting: Physical Therapy

## 2021-03-23 DIAGNOSIS — M6281 Muscle weakness (generalized): Secondary | ICD-10-CM | POA: Diagnosis not present

## 2021-03-23 DIAGNOSIS — R293 Abnormal posture: Secondary | ICD-10-CM

## 2021-03-23 DIAGNOSIS — M542 Cervicalgia: Secondary | ICD-10-CM | POA: Diagnosis not present

## 2021-03-23 DIAGNOSIS — R252 Cramp and spasm: Secondary | ICD-10-CM

## 2021-03-23 DIAGNOSIS — G43001 Migraine without aura, not intractable, with status migrainosus: Secondary | ICD-10-CM

## 2021-03-23 NOTE — Therapy (Signed)
[place], [place], [address] Phone: [phone]   Fax:  [phone]  Physical Therapy Treatment / Discharge  Patient Details  Name: Gail Blackburn MRN: [id number] Date of Birth: 04-Mar-1976 Referring Provider (PT): Debbora Presto, NP   Encounter Date: 03/23/2021   PT End of Session - 03/23/21 1546     Visit Number 21    Number of Visits 22    Date for PT Re-Evaluation 03/23/21    Authorization Type MCD of [place]    Authorization Time Period 02/24/2021 to 03/23/2021 4 visits authorized (22 visit total)    Authorization - Visit Number 5    Authorization - Number of Visits 6    PT Start Time 6063    PT Stop Time 0160    PT Time Calculation (min) 48 min    Activity Tolerance Patient tolerated treatment well;No increased pain    Behavior During Therapy WFL for tasks assessed/performed             Past Medical History:  Diagnosis Date   Anemia    PRIOR HISTORY   Anxiety    Asthma    Bipolar disorder ([place])    Borderline   Depression    Fibromyalgia    Headache    MIGRAINES   Hypothyroidism 2009   TOOK MEDS FOR FEW WEEKS NO MEDS NOW   Migraine    Thyroid disease    treated in past not sure if too high or too low    Past Surgical History:  Procedure Laterality Date   COLONOSCOPY     DILATION AND EVACUATION N/A 05/06/2016   Procedure: DILATATION AND EVACUATION;  Surgeon: Paula Compton, MD;  Location: [facility];  Service: Gynecology;  Laterality: N/A;   FOOT SURGERY     TEETH PULLED  03/2016   WISDOM TOOTH EXTRACTION      There were no vitals filed for this visit.   Subjective Assessment - 03/23/21 1546     Subjective " I've been having some migraines with the bad one that occured last week. I still have the migraine triggers such as bright lights, and loud noises."    Patient Stated Goals decrease tension & migraines    Currently in Pain? Yes    Pain Score 4     Pain Location Neck    Pain  Orientation Right;Left                [facility] PT Assessment - 03/23/21 0001       Assessment   Medical Diagnosis cervicalgia, migraines    Referring Provider (PT) Amy Lomax, NP      AROM   Cervical Flexion 20    Cervical Extension 32    Cervical - Right Side Bend 20    Cervical - Left Side Bend 22    Cervical - Right Rotation 51    Cervical - Left Rotation 56      Strength   Right Shoulder External Rotation 4+/5    Left Shoulder External Rotation 4-/5                           [facility] Adult PT Treatment/Exercise - 03/23/21 0001       Shoulder Exercises: ROM/Strengthening   UBE (Upper Arm Bike) L5 x 5 min   fwd/ bwd x 2:30 sec     Moist Heat Therapy   Number Minutes Moist Heat 10 Minutes  Moist Heat Location Cervical   thoracic region     Manual Therapy   Manual therapy comments skilled palpation and monitoring during TPDN  overpressure with cervical Rotation R and L    Joint Mobilization rt first rib mobilization, C-2-C-5 r UPA, R C2-C5 gapping mobs    Soft tissue mobilization bil upper trap, levator, mid trap, rhomboids bil cervical paraspinals    Manual Traction sub-occipital release              Trigger Point Dry Needling - 03/23/21 0001     Consent Given? Yes    Education Handout Provided Previously provided    Upper Trapezius Response Palpable increased muscle length;Twitch reponse elicited   bil   Suboccipitals Response Twitch response elicited;Palpable increased muscle length    Levator Scapulae Response Twitch response elicited;Palpable increased muscle length   bil   Cervical multifidi Response Twitch reponse elicited;Palpable increased muscle length   bil   Rhomboids Response Twitch response elicited;Palpable increased muscle length   bil                 PT Education - 03/23/21 1626     Education Details Reviewed HEp and discusssed  importance of consistency with her HEP to promote shoulder strength with gradual  increase in reps/ sets and resistance.    Person(s) Educated Patient    Methods Explanation;Verbal cues;Handout    Comprehension Verbalized understanding;Verbal cues required              PT Short Term Goals - 03/23/21 1625       PT SHORT TERM GOAL #1   Title Cervical rotation within 5 deg Rt to Lt to decrease unequal strain    Period Weeks    Status Partially Met      PT SHORT TERM GOAL #2   Title will verbalize performing HEP at least every other day    Period Weeks    Status Achieved      PT SHORT TERM GOAL #3   Title GHJ ER strength to 5/5    Period Weeks    Status Partially Met               PT Long Term Goals - 03/23/21 1625       PT LONG TERM GOAL #1   Title pt will be able to drive comfortably without limitation by neck pain    Status Partially Met      PT LONG TERM GOAL #2   Title HA/migraines to =<3 days/week    Period Weeks    Status Achieved      PT LONG TERM GOAL #3   Title Average pain with ADLs <=4/10    Period Weeks    Status Partially Met                   Plan - 03/23/21 1630     Clinical Impression Statement Avni has made some progress with physical therapy with report of reduce migraine frequency, and improved shoudler strength. She does continue to report fluctuating pain in the neck/ shoulder with intermittent migraines and cotninues to have photosensitity as well as sensitivty to loud noises which can trigger a migraine. continued TPDN for ther postreior cervical musculature and posterior scapular region followed with [facility]. She met or partially met all goals, and at this point appears to have plateaued in regard to functional improvement and benefit that can be provided from PT. Reviewed her HEP and discussed importance of consistency  and adhering to weekly routine to promote strength. pt is going to be discharged from physical therapy today.    PT Treatment/Interventions ADLs/Self Care Home Management;Cryotherapy;Electrical  Stimulation;Traction;Moist Heat;Iontophoresis 15m/ml Dexamethasone;Therapeutic activities;Therapeutic exercise;Patient/family education;Neuromuscular re-education;Manual techniques;Taping;Dry needling;Joint Manipulations;Spinal Manipulations    PT Next Visit Plan DC             Patient will benefit from skilled therapeutic intervention in order to improve the following deficits and impairments:  Improper body mechanics, Pain, Postural dysfunction, Increased muscle spasms, Decreased activity tolerance, Decreased strength, Impaired UE functional use, Hypermobility  Visit Diagnosis: Cervicalgia  Migraine without aura and with status migrainosus, not intractable  Cramp and spasm  Abnormal posture  Muscle weakness (generalized)     Problem List Patient Active Problem List   Diagnosis Date Noted   Splenic vein thrombosis    Alcohol use disorder, moderate, dependence (HCC)    Constipation    Alcohol-induced acute pancreatitis    Alcohol withdrawal syndrome without complication (HCC)    Hyponatremia    Pain of upper abdomen 10/28/2020   Tinea manuum 05/21/2019   Abdominal wall lump 02/21/2018   Chronic fatigue syndrome with fibromyalgia 02/24/2017   Skin lesion of left leg 02/10/2017   History of abnormal cervical Pap smear 02/10/2017   Anxiety 10/15/2014   Depression 10/15/2014   Migraine 10/15/2014   Chronic pain 10/15/2014   Low TSH level 10/15/2014   Tobacco use disorder 10/15/2014    LStarr Lake6/28/2022, 4:55 PM  [facility]GC[place] [address]Phone: [phone]  Fax:  [phone] Name: AIlham RoughtonMRN: [id number] of Birth: 1Dec 24, 1977  PHYSICAL THERAPY DISCHARGE SUMMARY  Visits from Start of Care: 21  Current functional level related to goals / functional outcomes: See goals   Remaining deficits: See assessment   Education / Equipment: HEP, theraband, posture,  lifting mechanics   Patient agrees to discharge. Patient goals were partially met. Patient is being discharged due to lack of progress.  Shaun Runyon PT, DPT, LAT, ATC  03/23/21  4:56 PM

## 2021-04-06 ENCOUNTER — Telehealth: Payer: Self-pay | Admitting: Family Medicine

## 2021-04-06 NOTE — Telephone Encounter (Signed)
Patient's next Botox appointment is 8/3. I received a call from [place] with [name] to schedule Botox delivery. Botox TBD 7/19.

## 2021-04-13 NOTE — Telephone Encounter (Signed)
Received (2) 100 unit vials of Botox today from [employer].

## 2021-04-26 ENCOUNTER — Encounter: Payer: Self-pay | Admitting: Family Medicine

## 2021-04-26 ENCOUNTER — Other Ambulatory Visit: Payer: Self-pay

## 2021-04-26 ENCOUNTER — Ambulatory Visit (INDEPENDENT_AMBULATORY_CARE_PROVIDER_SITE_OTHER): Payer: Medicaid Other | Admitting: Family Medicine

## 2021-04-26 DIAGNOSIS — M542 Cervicalgia: Secondary | ICD-10-CM

## 2021-04-26 NOTE — Patient Instructions (Signed)
I will call with x ray results. We can talk more about MRI when I call.  I honestly doubt it will give [place] the answer that you are looking for. I don't have any great suggestions for you.  I think it would be worth the time to see the chiropractor again.   Congrats on being healthier.  I think that a healthy life style is your best chance for real long term pain relief.

## 2021-04-27 ENCOUNTER — Encounter: Payer: Self-pay | Admitting: Family Medicine

## 2021-04-27 NOTE — Progress Notes (Signed)
    SUBJECTIVE:   CHIEF COMPLAINT / HPI:   Right sided neck pain:  Patient with longstanding headaches presents with worsening neck pain not relieved by botox injections or PT.  Seeing neuro for migraines per patient, she was told the neck pain was a new problem and she should "go see her PCP and get a neck MRI."  Pain is mostly right sided in neck extensors.  Travels down to right trapezius/shoulder area.  No numbness, weakness or pain in either arm.  Her conceptualization is that her muscles spasm easily and she has not found treatments that reliably improve that spasm.  She is on multiple meds for pain/muscle spasm.  In addition to her botox, she has  "tried all the different topicals", currently using diclofenac gel, cyclobenzaprene, ibuprofen and high dose gabapentin.  She is also on clonazepam for anxiety with benzos having muscle relaxent properties.  States that she will likely be switched back to Xanax.  States she has also been tried on duloxetine in the past without benefit.  Old (2011) neck films were normal.  Taking bupropion, effexor and clonazepam for what she describes as anxiety.  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      OBJECTIVE:   BP 92/70   Pulse 75   Ht 5\' 6"  (1.676 m)   Wt 130 lb 12.8 oz (59.3 kg)   LMP 04/19/2021 (Approximate)   SpO2 100%   BMI 21.11 kg/m   Gen WNWD female Neck: Limited ROM from spasm.  Considerable tenderness in right paraspinous neck extensors.  Much less on right.  Bilateral trapezius tenderness and tightness. Normal strength, sensation and reflexes in both upper extremities.  ASSESSMENT/PLAN:   Neck pain I view this less as an anatomic problem and more as a chronic pain problem.  She is already doing PT and is on multiple medications to help her cope with the pain.  I think it is reasonable to repeat the c-spine series plain films.  Without radiculopathy, she  has no indication for an MRI.  For symptom relief, again I am limited because she is already doing many of the right things.  She has seen a chiropracter in the past (not for this problem) and got some relief from OMT.  I congratulated her on her new, healthy life style.  I do not have a quick fix for this problem.       04/21/2021, MD [facility]

## 2021-04-27 NOTE — Assessment & Plan Note (Signed)
I view this less as an anatomic problem and more as a chronic pain problem.  She is already doing PT and is on multiple medications to help her cope with the pain.  I think it is reasonable to repeat the c-spine series plain films.  Without radiculopathy, she has no indication for an MRI.  For symptom relief, again I am limited because she is already doing many of the right things.  She has seen a chiropracter in the past (not for this problem) and got some relief from OMT.  I congratulated her on her new, healthy life style.  I do not have a quick fix for this problem.

## 2021-04-28 ENCOUNTER — Ambulatory Visit
Admission: RE | Admit: 2021-04-28 | Discharge: 2021-04-28 | Disposition: A | Discharge status: Home / Self Care | Payer: Medicaid Other | Source: Ambulatory Visit | Attending: Family Medicine | Admitting: Family Medicine

## 2021-04-28 ENCOUNTER — Ambulatory Visit: Payer: Medicaid Other | Admitting: Family Medicine

## 2021-04-28 DIAGNOSIS — G43709 Chronic migraine without aura, not intractable, without status migrainosus: Secondary | ICD-10-CM

## 2021-04-28 DIAGNOSIS — M542 Cervicalgia: Secondary | ICD-10-CM

## 2021-04-28 NOTE — Progress Notes (Signed)
Botox- 100 units x 2 vials Lot: [id number] Expiration: 09/23 NDC: 1017-5102-58  Bacteriostatic 0.9% Sodium Chloride- 32mL total Lot: [id number] Expiration: 09/26/22 NDC: 5277-8242-35  Dx: T61.443 S/P
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
## 2021-04-30 ENCOUNTER — Other Ambulatory Visit: Payer: Self-pay | Admitting: Family Medicine

## 2021-04-30 DIAGNOSIS — G894 Chronic pain syndrome: Secondary | ICD-10-CM

## 2021-05-04 DIAGNOSIS — Z79891 Long term (current) use of opiate analgesic: Secondary | ICD-10-CM | POA: Diagnosis not present

## 2021-05-15 ENCOUNTER — Other Ambulatory Visit: Payer: Self-pay | Admitting: Student in an Organized Health Care Education/Training Program

## 2021-05-15 DIAGNOSIS — G43709 Chronic migraine without aura, not intractable, without status migrainosus: Secondary | ICD-10-CM

## 2021-06-10 ENCOUNTER — Telehealth: Payer: Self-pay | Admitting: Family Medicine

## 2021-06-10 DIAGNOSIS — Z1159 Encounter for screening for other viral diseases: Secondary | ICD-10-CM | POA: Diagnosis not present

## 2021-06-10 DIAGNOSIS — E559 Vitamin D deficiency, unspecified: Secondary | ICD-10-CM | POA: Diagnosis not present

## 2021-06-10 DIAGNOSIS — M129 Arthropathy, unspecified: Secondary | ICD-10-CM | POA: Diagnosis not present

## 2021-06-10 DIAGNOSIS — M542 Cervicalgia: Secondary | ICD-10-CM | POA: Diagnosis not present

## 2021-06-10 DIAGNOSIS — G894 Chronic pain syndrome: Secondary | ICD-10-CM | POA: Diagnosis not present

## 2021-06-10 NOTE — Telephone Encounter (Signed)
Patient left a voicemail on my phone stating that no one will order an MRI for her. She stated that the pain clinic said she needs the neurologist to order her an MRI. She said she is desperate at this point.

## 2021-06-10 NOTE — Telephone Encounter (Signed)
Called the patient back. Pt has been suffering with neck pain for 9/10 months which is a new problem that started after being seen as a patient here. Amy recommended the patient follow with primary care first at which point she did and they ordered xray of the neck which was negative. She then followed with her pain clinic and tried to see if they would order a MRI and they reviewed the Xray and said that because it didn't show anything insurance would not cover the MRI with them ordering it. They advised her to follow up and evaluate further through neurology.  Given it is a "new problem" pt was placed on Dr Trevor Mace schedule to be further evaluated and see if MRI of the neck is necessary next step.

## 2021-06-11 DIAGNOSIS — Z79899 Other long term (current) drug therapy: Secondary | ICD-10-CM | POA: Diagnosis not present

## 2021-06-11 DIAGNOSIS — M542 Cervicalgia: Secondary | ICD-10-CM | POA: Diagnosis not present

## 2021-06-11 DIAGNOSIS — G894 Chronic pain syndrome: Secondary | ICD-10-CM | POA: Diagnosis not present

## 2021-06-11 DIAGNOSIS — M791 Myalgia, unspecified site: Secondary | ICD-10-CM | POA: Diagnosis not present

## 2021-06-22 ENCOUNTER — Ambulatory Visit: Payer: Medicaid Other | Admitting: Neurology

## 2021-06-22 ENCOUNTER — Telehealth: Payer: Self-pay | Admitting: Neurology

## 2021-06-22 ENCOUNTER — Encounter: Payer: Self-pay | Admitting: Neurology

## 2021-06-22 VITALS — BP 113/77 | HR 68 | Ht 66.0 in | Wt 131.6 lb

## 2021-06-22 DIAGNOSIS — R200 Anesthesia of skin: Secondary | ICD-10-CM

## 2021-06-22 DIAGNOSIS — M5412 Radiculopathy, cervical region: Secondary | ICD-10-CM | POA: Diagnosis not present

## 2021-06-22 DIAGNOSIS — R202 Paresthesia of skin: Secondary | ICD-10-CM | POA: Diagnosis not present

## 2021-06-22 DIAGNOSIS — R29898 Other symptoms and signs involving the musculoskeletal system: Secondary | ICD-10-CM | POA: Diagnosis not present

## 2021-06-22 DIAGNOSIS — M542 Cervicalgia: Secondary | ICD-10-CM | POA: Diagnosis not present

## 2021-06-22 DIAGNOSIS — M5481 Occipital neuralgia: Secondary | ICD-10-CM

## 2021-06-22 DIAGNOSIS — G8929 Other chronic pain: Secondary | ICD-10-CM | POA: Diagnosis not present

## 2021-06-22 MED ORDER — METHYLPREDNISOLONE 4 MG PO TBPK: ORAL_TABLET | ORAL | 1 refills | Status: DC

## 2021-06-22 NOTE — Patient Instructions (Addendum)
Medrol dosepak and occipital nerve block today. MRI cervical spine ordered.   Occipital Nerve Block Patient Information  Description: The occipital nerves originate in the cervical (neck) spinal cord and travel upward through muscle and tissue to supply sensation to the back of the head and top of the scalp.  In addition, the nerves control some of the muscles of the scalp.  Occipital neuralgia is an irritation of these nerves which can cause headaches, numbness of the scalp, and neck discomfort.     The occipital nerve block will interrupt nerve transmission through these nerves and can relieve pain and spasm.  The block consists of insertion of a small needle under the skin in the back of the head to deposit local anesthetic (numbing medicine) and/or steroids around the nerve.  The entire block usually lasts less than 5 minutes.  Conditions which may be treated by occipital blocks:  Muscular pain and spasm of the scalp Nerve irritation, back of the head Headaches Upper neck pain  Possible side-effects:  Bleeding from needle site Infection (rare, may require surgery) Nerve injury (rare) Hair on back of neck can be tinged with iodine scrub (this will wash out) Light-headedness (temporary) Pain at injection site (several days) Decreased blood pressure (rare, temporary) Seizure (very rare)  Call if you experience:  Hives or difficulty breathing ( go to the emergency room) Inflammation or drainage at the injection site(s)  Please note:  Although the local anesthetic injected can often make your painful muscles or headache feel good for several hours after the injection, the pain may return.  It takes 3-7 days for steroids to work.  You may not notice any pain relief for at least one week.  If effective, we will often do a series of injections spaced 3-6 weeks apart to maximally decrease your pain.  If you have any questions, please call [phone]  [facility] Methylprednisolone Tablets What is this medication? METHYLPREDNISOLONE (meth ill pred NISS oh lone) treats many conditions such as asthma, allergic reactions, arthritis, inflammatory bowel diseases, adrenal, and blood or bone marrow disorders. It works by decreasing inflammation, slowing down an overactive immune system, or replacing cortisol normally made in the body. Cortisol is a hormone that plays an important role in how the body responds to stress, illness, and injury. It belongs to a group of medications called steroids. This medicine may be used for other purposes; ask your health care provider or pharmacist if you have questions. COMMON BRAND NAME(S): Medrol, Medrol Dosepak What should I tell my care team before I take this medication? They need to know if you have any of these conditions: Cushing's syndrome Eye disease, vision problems Diabetes Glaucoma Heart disease High blood pressure Infection (especially a virus infection such as chickenpox, cold sores, or herpes) Liver disease Mental illness Myasthenia gravis Osteoporosis Recently received or scheduled to receive a vaccine Seizures Stomach or intestine problems Thyroid disease An unusual or allergic reaction to lactose, methylprednisolone, other medications, foods, dyes, or preservatives Pregnant or trying to get pregnant Breast-feeding How should I use this medication? Take this medication by mouth with a glass of water. Follow the directions on the prescription label. Take this medication with food. If you are taking this medication once a day, take it in the morning. Do not take it more often than directed. Do not suddenly stop taking your medication because you may develop a severe reaction. Your care team will tell you how much medication to take.  If your care team wants you to stop the medication, the dose may be slowly lowered over time to avoid any side effects. Talk to your care team about the  use of this medication in children. Special care may be needed. Overdosage: If you think you have taken too much of this medicine contact a poison control center or emergency room at once. NOTE: This medicine is only for you. Do not share this medicine with others. What if I miss a dose? If you miss a dose, take it as soon as you can. If it is almost time for your next dose, talk to your care team. You may need to miss a dose or take an extra dose. Do not take double or extra doses without advice. What may interact with this medication? Do not take this medication with any of the following: Alefacept Echinacea Live virus vaccines Metyrapone Mifepristone This medication may also interact with the following: Amphotericin B Aspirin and aspirin-like medications Certain antibiotics like erythromycin, clarithromycin, troleandomycin Certain medications for diabetes Certain medications for fungal infections like ketoconazole Certain medications for seizures like carbamazepine, phenobarbital, phenytoin Certain medications that treat or prevent blood clots like warfarin Cholestyramine Cyclosporine Digoxin Diuretics Female hormones, like estrogens and birth control pills Isoniazid NSAIDs, medications for pain inflammation, like ibuprofen or naproxen Other medications for myasthenia gravis Rifampin Vaccines This list may not describe all possible interactions. Give your health care provider a list of all the medicines, herbs, non-prescription drugs, or dietary supplements you use. Also tell them if you smoke, drink alcohol, or use illegal drugs. Some items may interact with your medicine. What should I watch for while using this medication? Tell your care team if your symptoms do not start to get better or if they get worse. Do not stop taking except on your care team's advice. You may develop a severe reaction. Your care team will tell you how much medication to take. This medication may  increase your risk of getting an infection. Tell your care team if you are around anyone with measles or chickenpox, or if you develop sores or blisters that do not heal properly. This medication may increase blood sugar levels. Ask your care team if changes in diet or medications are needed if you have diabetes. Tell your care team right away if you have any change in your eyesight. Using this medication for a long time may increase your risk of low bone mass. Talk to your care team about bone health. What side effects may I notice from receiving this medication? Side effects that you should report to your care team as soon as possible: Allergic reactions-skin rash, itching, hives, swelling of the face, lips, tongue, or throat Cushing syndrome-increased fat around the midsection, upper back, neck, or face, pink or purple stretch marks on the skin, thinning, fragile skin that easily bruises, unexpected hair growth High blood sugar (hyperglycemia)-increased thirst or amount of urine, unusual weakness or fatigue, blurry vision Increase in blood pressure Infection-fever, chills, cough, sore throat, wounds that don't heal, pain or trouble when passing urine, general feeling of discomfort or being unwell Low adrenal gland function-nausea, vomiting, loss of appetite, unusual weakness or fatigue, dizziness Mood and behavior changes-anxiety, nervousness, confusion, hallucinations, irritability, hostility, thoughts of suicide or self-harm, worsening mood, feelings of depression Stomach bleeding-bloody or black, tar-like stools, vomiting blood or brown material that looks like coffee grounds Swelling of the ankles, hands, or feet Side effects that usually do not require medical attention (report to  your care team if they continue or are bothersome): Acne General discomfort and fatigue Headache Increase in appetite Nausea Trouble sleeping Weight gain This list may not describe all possible side effects.  Call your doctor for medical advice about side effects. You may report side effects to FDA at 1-800-FDA-1088. Where should I keep my medication? Keep out of the reach of children and pets. Store at room temperature between 20 and 25 degrees C (68 and 77 degrees F). Throw away any unused medication after the expiration date. NOTE: This sheet is a summary. It may not cover all possible information. If you have questions about this medicine, talk to your doctor, pharmacist, or health care provider.  2022 [employer]vier/Gold Standard (2020-12-08 10:55:23)
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
## 2021-06-22 NOTE — Progress Notes (Signed)
Nerve block ( w/o) steroid: Pt signed consent yes   0.5% Bupivocaine 26ml LOT: [id number] EXP: 09/30/2021 NDC: 8416-6063-01

## 2021-06-22 NOTE — Telephone Encounter (Signed)
medicaid order sent to [facility], [facility] they will reach out to the patient to schedule.  

## 2021-06-24 ENCOUNTER — Ambulatory Visit: Payer: Medicaid Other | Admitting: Neurology

## 2021-06-24 DIAGNOSIS — M5481 Occipital neuralgia: Secondary | ICD-10-CM | POA: Diagnosis not present

## 2021-06-24 DIAGNOSIS — M542 Cervicalgia: Secondary | ICD-10-CM

## 2021-06-24 NOTE — Progress Notes (Signed)
30-gauge needle was used. All procedures a documented blood were medically necessary, reasonable and appropriate based on the patient's history, medical diagnosis and physician opinion. Verbal informed consent was obtained from the patient, patient was informed of potential risk of procedure, including bruising, bleeding, hematoma formation, infection, muscle weakness, muscle pain, numbness, transient hypertension, transient hyperglycemia and transient insomnia among others. All areas injected were topically clean with isopropyl rubbing alcohol. Nonsterile nonlatex gloves were worn during the procedure.  1. Greater occipital nerve block [phone]). The greater occipital nerve site was identified at the nuchal line medial to the occipital artery. Medication was injected into the right occipital nerve areas and suboccipital areas. Patient's condition is associated with inflammation of the greater occipital nerve and associated multiple groups. Injection was deemed medically necessary, reasonable and appropriate. Injection represents a separate and unique surgical service.  2. Lesser occipital nerve block [phone]). The lesser occipital nerve site was identified approximately 2 cm lateral to the greater occipital nerve. Occasion was injected into the right occipital nerve areas. Patient's condition is associated with inflammation of the lesser occipital nerve and associated muscle groups. Injection was deemed medically necessary, reasonable and appropriate. Injection represents a separate and unique surgical service.  3. Auriculotemporal nerve block ([id number]): The Auriculotemporal nerve site was identified along the posterior margin of the sternocleidomastoid muscle toward the base of the ear. Medication was injected into the  right radicular temporal nerve areas. Patient's condition is associated with inflammation of the Auriculotemporal Nerve and associated muscle groups. Injection was deemed medically necessary,  reasonable and appropriate. Injection represents a separate and unique surgical service.  4. [id number]: right trapezius, 5-level cervical paraspinals right

## 2021-06-24 NOTE — Telephone Encounter (Signed)
Pt is on the schedule.

## 2021-06-24 NOTE — Progress Notes (Signed)
Nerve block  w/o) steroid: Pt signed consent yes  0.5% Bupivocaine 29mL LOT: [id number] EXP: 01/24/2022 NDC: 8003-4917-91  .25% Bupivocaine  12 mL LOT: [id number] EXP: 04/29/2022 NDC: 01655-374-82

## 2021-07-04 ENCOUNTER — Other Ambulatory Visit: Payer: Self-pay

## 2021-07-04 ENCOUNTER — Ambulatory Visit
Admission: RE | Admit: 2021-07-04 | Discharge: 2021-07-04 | Disposition: A | Discharge status: Home / Self Care | Payer: Medicaid Other | Source: Ambulatory Visit | Attending: Neurology | Admitting: Neurology

## 2021-07-04 DIAGNOSIS — M542 Cervicalgia: Secondary | ICD-10-CM | POA: Diagnosis not present

## 2021-07-04 DIAGNOSIS — G8929 Other chronic pain: Secondary | ICD-10-CM

## 2021-07-04 DIAGNOSIS — R29898 Other symptoms and signs involving the musculoskeletal system: Secondary | ICD-10-CM

## 2021-07-04 DIAGNOSIS — R202 Paresthesia of skin: Secondary | ICD-10-CM

## 2021-07-04 DIAGNOSIS — R200 Anesthesia of skin: Secondary | ICD-10-CM

## 2021-07-04 DIAGNOSIS — M4802 Spinal stenosis, cervical region: Secondary | ICD-10-CM | POA: Diagnosis not present

## 2021-07-04 DIAGNOSIS — M5412 Radiculopathy, cervical region: Secondary | ICD-10-CM

## 2021-07-06 ENCOUNTER — Other Ambulatory Visit: Payer: Self-pay | Admitting: Neurology

## 2021-07-06 ENCOUNTER — Telehealth: Payer: Self-pay | Admitting: *Deleted

## 2021-07-06 DIAGNOSIS — M5412 Radiculopathy, cervical region: Secondary | ICD-10-CM

## 2021-07-06 NOTE — Telephone Encounter (Signed)
-----   Message from Anson Fret, MD sent at 07/05/2021  2:55 PM EDT ----- Gail Blackburn, you have a lot of arthritis in your neck. At one level it looks like your C7 nerve is pinched on the right which corresponds with your symptoms down your right arm. Do you want a referral for injections in the neck or for a surgical evaluation? I will ask my nurse to call you, thanks Dr. Lucia Gaskins

## 2021-07-06 NOTE — Telephone Encounter (Signed)
Spoke with patient and discussed MRI cervical spine as noted below.  Patient's questions were answered.  She will prefer injections first before surgery.  I let her know message will be sent to Dr. Lucia Gaskins and we would refer her to another physician that can help.  She verbalized appreciation for the call.

## 2021-07-07 NOTE — Telephone Encounter (Signed)
Referral and notes faxed to [facility]. Phone: [phone].

## 2021-07-19 DIAGNOSIS — H5213 Myopia, bilateral: Secondary | ICD-10-CM | POA: Diagnosis not present

## 2021-07-27 ENCOUNTER — Telehealth: Payer: Self-pay | Admitting: Neurology

## 2021-07-27 NOTE — Telephone Encounter (Signed)
Pt called asking for another nerve block and also wanting another referral to see the neurosurgeon. Pt states she late to her last consultation dn the doctor left, couldn't get another appt until the 22nd. Pt wants to be sooner than that. Requesting a call back.

## 2021-07-28 DIAGNOSIS — I10 Essential (primary) hypertension: Secondary | ICD-10-CM | POA: Diagnosis not present

## 2021-07-28 DIAGNOSIS — M5412 Radiculopathy, cervical region: Secondary | ICD-10-CM | POA: Diagnosis not present

## 2021-07-29 ENCOUNTER — Ambulatory Visit: Payer: Medicaid Other | Admitting: Family Medicine

## 2021-08-03 DIAGNOSIS — Z79891 Long term (current) use of opiate analgesic: Secondary | ICD-10-CM | POA: Diagnosis not present

## 2021-08-04 ENCOUNTER — Other Ambulatory Visit: Payer: Self-pay

## 2021-08-04 MED ORDER — ONDANSETRON HCL 4 MG PO TABS: ORAL_TABLET | ORAL | 9 refills | Status: DC

## 2021-08-09 ENCOUNTER — Encounter: Payer: Self-pay | Admitting: Neurology

## 2021-08-09 ENCOUNTER — Ambulatory Visit: Payer: Medicaid Other | Admitting: Neurology

## 2021-08-09 DIAGNOSIS — M7918 Myalgia, other site: Secondary | ICD-10-CM

## 2021-08-09 DIAGNOSIS — G43711 Chronic migraine without aura, intractable, with status migrainosus: Secondary | ICD-10-CM

## 2021-08-09 DIAGNOSIS — M5412 Radiculopathy, cervical region: Secondary | ICD-10-CM

## 2021-08-09 NOTE — Progress Notes (Signed)
Botox- 100 units x 2 vials Lot: [id number] Expiration: 08/2023 NDC: 0023-1145-01  Bacteriostatic 0.9% Sodium Chloride- 4mL total Lot: [id number] Expiration: 09/26/2022 NDC: 0409-1966-02  Dx:G43.711 S/P   

## 2021-08-09 NOTE — Progress Notes (Signed)
08/09/2021 ALL: She returns for Botox. >50% improvement, she has a lot of neck pain, getting injections at [facility]. Resend to [facility], she requested some dry needling, loved Shanda Bumps.    Consent Form Botulism Toxin Injection For Chronic Migraine    Reviewed orally with patient, additionally signature is on file:  Botulism toxin has been approved by the Federal drug administration for treatment of chronic migraine. Botulism toxin does not cure chronic migraine and it may not be effective in some patients.  The administration of botulism toxin is accomplished by injecting a small amount of toxin into the muscles of the neck and head. Dosage must be titrated for each individual. Any benefits resulting from botulism toxin tend to wear off after 3 months with a repeat injection required if benefit is to be maintained. Injections are usually done every 3-4 months with maximum effect peak achieved by about 2 or 3 weeks. Botulism toxin is expensive and you should be sure of what costs you will incur resulting from the injection.  The side effects of botulism toxin use for chronic migraine may include:   -Transient, and usually mild, facial weakness with facial injections  -Transient, and usually mild, head or neck weakness with head/neck injections  -Reduction or loss of forehead facial animation due to forehead muscle weakness  -Eyelid drooping  -Dry eye  -Pain at the site of injection or bruising at the site of injection  -Double vision  -Potential unknown long term risks   Contraindications: You should not have Botox if you are pregnant, nursing, allergic to albumin, have an infection, skin condition, or muscle weakness at the site of the injection, or have myasthenia gravis, Lambert-Eaton syndrome, or ALS.  It is also possible that as with any injection, there may be an allergic reaction or no effect from the medication. Reduced effectiveness after repeated  injections is sometimes seen and rarely infection at the injection site may occur. All care will be taken to prevent these side effects. If therapy is given over a long time, atrophy and wasting in the muscle injected may occur. Occasionally the patient's become refractory to treatment because they develop antibodies to the toxin. In this event, therapy needs to be modified.  I have read the above information and consent to the administration of botulism toxin.    BOTOX PROCEDURE NOTE FOR MIGRAINE HEADACHE  Contraindications and precautions discussed with patient(above). Aseptic procedure was observed and patient tolerated procedure. Procedure performed by Shawnie Dapper, FNP-C.   The condition has existed for more than 6 months, and pt does not have a diagnosis of ALS, Myasthenia Gravis or Lambert-Eaton Syndrome.  Risks and benefits of injections discussed and pt agrees to proceed with the procedure.  Written consent obtained  These injections are medically necessary. Pt  receives good benefits from these injections. These injections do not cause sedations or hallucinations which the oral therapies may cause.   Description of procedure:  The patient was placed in a sitting position. The standard protocol was used for Botox as follows, with 5 units of Botox injected at each site:  -Procerus muscle, midline injection  -Corrugator muscle, bilateral injection  -Frontalis muscle, bilateral injection, with 2 sites each side, medial injection was performed in the upper one third of the frontalis muscle, in the region vertical from the medial inferior edge of the superior orbital rim. The lateral injection was again in the upper one third of the forehead vertically above the lateral limbus of the cornea,  1.5 cm lateral to the medial injection site.  -Temporalis muscle injection, 4 sites, bilaterally. The first injection was 3 cm above the tragus of the ear, second injection site was 1.5 cm to 3 cm up  from the first injection site in line with the tragus of the ear. The third injection site was 1.5-3 cm forward between the first 2 injection sites. The fourth injection site was 1.5 cm posterior to the second injection site. 5th site laterally in the temporalis  muscleat the level of the outer canthus.  -Occipitalis muscle injection, 3 sites, bilaterally. The first injection was done one half way between the occipital protuberance and the tip of the mastoid process behind the ear. The second injection site was done lateral and superior to the first, 1 fingerbreadth from the first injection. The third injection site was 1 fingerbreadth superiorly and medially from the first injection site.  -Cervical paraspinal muscle injection, 2 sites, bilaterally. The first injection site was 1 cm from the midline of the cervical spine, 3 cm inferior to the lower border of the occipital protuberance. The second injection site was 1.5 cm superiorly and laterally to the first injection site.  -Trapezius muscle injection was performed at 3 sites, bilaterally. The first injection site was in the upper trapezius muscle halfway between the inflection point of the neck, and the acromion. The second injection site was one half way between the acromion and the first injection site. The third injection was done between the first injection site and the inflection point of the neck.     Will return for repeat injection in 3 months.   A total of 155 units of Botox was prepared, 45 units of Botox was injected as documented above, any Botox not injected was wasted. The patient tolerated the procedure well, there were no complications of the above procedure.

## 2021-08-11 ENCOUNTER — Ambulatory Visit: Payer: Medicaid Other | Attending: Neurology

## 2021-08-11 ENCOUNTER — Other Ambulatory Visit: Payer: Self-pay

## 2021-08-11 DIAGNOSIS — M7918 Myalgia, other site: Secondary | ICD-10-CM | POA: Diagnosis not present

## 2021-08-11 DIAGNOSIS — M6281 Muscle weakness (generalized): Secondary | ICD-10-CM | POA: Diagnosis not present

## 2021-08-11 DIAGNOSIS — G43001 Migraine without aura, not intractable, with status migrainosus: Secondary | ICD-10-CM | POA: Diagnosis not present

## 2021-08-11 DIAGNOSIS — R293 Abnormal posture: Secondary | ICD-10-CM | POA: Diagnosis not present

## 2021-08-11 DIAGNOSIS — M62838 Other muscle spasm: Secondary | ICD-10-CM | POA: Insufficient documentation

## 2021-08-11 DIAGNOSIS — R252 Cramp and spasm: Secondary | ICD-10-CM | POA: Diagnosis not present

## 2021-08-11 DIAGNOSIS — M5412 Radiculopathy, cervical region: Secondary | ICD-10-CM | POA: Insufficient documentation

## 2021-08-11 DIAGNOSIS — M542 Cervicalgia: Secondary | ICD-10-CM | POA: Diagnosis not present

## 2021-08-11 NOTE — Patient Instructions (Signed)
Access Code: [email or web address] URL: [email or web address] Date: 08/11/2021 Prepared by: Tresa Endo  Exercises Cat-Camel - 2 x daily - 7 x weekly - 1 sets - 10 reps - 5 hold Child's Pose Stretch - 2 x daily - 7 x weekly - 1 sets - 3 reps - 20 hold Child's Pose with Sidebending - 1 x daily - 7 x weekly - 3 sets - 10 reps - 20 hold Supine Cervical Rotation AROM on Pillow - 2 x daily - 7 x weekly - 1 sets - 3 reps - 20 hold Supine Cervical Sidebending Stretch - 2 x daily - 7 x weekly - 1 sets - 3 reps - 20 hold

## 2021-08-11 NOTE — Therapy (Signed)
[facility] @ [facility] [address] [place], [place], [address] Phone: [phone]   Fax:  [phone]  Physical Therapy Evaluation  Patient Details  Name: Gail Blackburn MRN: [id number] Date of Birth: 18-Nov-1975 Referring Provider (PT): Haynes Bast, MD   Encounter Date: 08/11/2021   PT End of Session - 08/11/21 1117     Visit Number 1    Date for PT Re-Evaluation 10/06/21    Authorization Type Medicaid- authorization requested    Authorization - Number of Visits 4    PT Start Time 1026    PT Stop Time 1100    PT Time Calculation (min) 34 min    Activity Tolerance Patient tolerated treatment well    Behavior During Therapy [facility] for tasks assessed/performed             Past Medical History:  Diagnosis Date   Anemia    PRIOR HISTORY   Anxiety    Asthma    Bipolar disorder (HCC)    Borderline   Depression    Fibromyalgia    Headache    MIGRAINES   Hypothyroidism 2009   TOOK MEDS FOR FEW WEEKS NO MEDS NOW   Migraine    Thyroid disease    treated in past not sure if too high or too low    Past Surgical History:  Procedure Laterality Date   COLONOSCOPY     DILATION AND EVACUATION N/A 05/06/2016   Procedure: DILATATION AND EVACUATION;  Surgeon: Huel Cote, MD;  Location: [facility];  Service: Gynecology;  Laterality: N/A;   FOOT SURGERY     TEETH PULLED  03/2016   WISDOM TOOTH EXTRACTION      There were no vitals filed for this visit.    Subjective Assessment - 08/11/21 1030     Subjective Pt presents with chronic history of neck pain and migraines.  Pain was managed with DN and PT and was feeling better.  PT ended 03/23/21.  Pt reports a worsening of pain at the Lt sided of the neck with increased tension.    Pertinent History migrianes with botox. bipolar disorder, fibromyalgia    How long can you stand comfortably? limited due to Lt scapular pain.  Pain at night with neck pain    Diagnostic tests MRI: see  results in chart.  Pt will get injection 08/24/21    Patient Stated Goals reduce neck tension, reduce neck pain and headaches    Currently in Pain? Yes    Pain Score 7    7-10/10   Pain Location Neck    Pain Orientation Left;Right    Pain Descriptors / Indicators Aching;Tightness    Pain Type Chronic pain    Pain Radiating Towards Lt scapula    Pain Onset More than a month ago    Pain Frequency Constant    Aggravating Factors  sleep on Lt side.  Waking at night due to pain (2-3x/night), anything    Pain Relieving Factors Heating pad, change of position, massage rollers (brief relief)                [facility] PT Assessment - 08/11/21 0001       Assessment   Medical Diagnosis cervical radiculopathy, myofascial pain syndrome    Referring Provider (PT) Haynes Bast, MD    Onset Date/Surgical Date --   2015- chronic   Next MD Visit 3 months      Precautions   Precautions None  Balance Screen   Has the patient fallen in the past 6 months No    Has the patient had a decrease in activity level because of a fear of falling?  No    Is the patient reluctant to leave their home because of a fear of falling?  No      Home [employer] information [employer] residence      Prior Function   Level of Independence Independent    Vocation On disability    Leisure I don't do anything becasue of my pain.  I help my mom, exercise, dance class      Cognition   Overall Cognitive Status Within Functional Limits for tasks assessed      Observation/Other Assessments   Focus on Therapeutic Outcomes (FOTO)  46 (55 is goal)      Posture/Postural Control   Posture/Postural Control Postural limitations    Postural Limitations Forward head;Rounded Shoulders   reduced cervical lordosis     ROM / Strength   AROM / PROM / Strength AROM;PROM;Strength      AROM   Overall AROM  Deficits    Overall AROM Comments UE A/ROM is full, pain with all cervical A/ROM    AROM Assessment Site  Cervical    Cervical Flexion 50    Cervical Extension 25    Cervical - Right Side Bend 25    Cervical - Left Side Bend 40    Cervical - Right Rotation 45    Cervical - Left Rotation 50      PROM   Overall PROM  Deficits    Overall PROM Comments limited by 25% into sidebending and rotation, pain reported at end range      Strength   Overall Strength Within functional limits for tasks performed      Palpation   Spinal mobility reduced PA mobs in cervical and thoracic segments with pain C3-6    Palpation comment tension and trigger points in Lt rhomboids, subscapulars, bil upper traps, cervical paraspinals and suboccipitals      Transfers   Transfers Independent with all Transfers      Ambulation/Gait   Ambulation/Gait Yes    Gait Pattern Within Functional Limits                        Objective measurements completed on examination: See above findings.                PT Education - 08/11/21 1058     Education Details Access Code: [email or web address]    Person(s) Educated Patient    Methods Explanation;Demonstration;Handout    Comprehension Verbalized understanding;Returned demonstration              PT Short Term Goals - 08/11/21 1108       PT SHORT TERM GOAL #1   Title be independent in initial HEP    Baseline --    Time 4    Period Weeks    Status New    Target Date 09/08/21      PT SHORT TERM GOAL #2   Title demonstrate cervical A/ROM rotation to > or = to 55 degrees to the Rt to improve safety with driving    Baseline 45    Time 4    Period Weeks    Status New    Target Date 09/08/21      PT SHORT TERM GOAL #3   Title report a 30% reduction in  neck pain with daily tasks    Baseline 7-10/10    Time 4    Period Weeks    Status New    Target Date 09/08/21               PT Long Term Goals - 08/11/21 1109       PT LONG TERM GOAL #1   Title be independent in intial HEP    Baseline not currently performing    Time 8     Period Weeks    Status New    Target Date 10/06/21      PT LONG TERM GOAL #2   Title improve FOTO to > or = to 55 to improve function    Baseline 45    Time 8    Period Weeks    Status New    Target Date 10/06/21      PT LONG TERM GOAL #3   Title demonstrate bil cervical A/ROM to > or = to 60 degrees to improve safety with driving    Baseline 08-67 degrees    Time 8    Period Weeks    Status New    Target Date 10/06/21      PT LONG TERM GOAL #4   Title report > or = to 60% reduction in frequency and intensity of neck pain    Baseline constant 7-10/10    Time 8    Period Weeks    Status New    Target Date 10/06/21      PT LONG TERM GOAL #5   Title report waking < or = to 1-2x/night with pain    Baseline 2-3xnight    Time 8    Period Weeks    Status New    Target Date 10/06/21                    Plan - 08/11/21 1114     Clinical Impression Statement Pt presents to PT with complaints of continued migraines and cervicalgia that has been a chronic complaint for this patient. Pt has been been treated by a PT at another facility in the past and DN is very helpful and she is returning for continued treatment. Pt had a recent MRI that showed cervical DDD and mild disc bulges throughout and will have injections next week.  Pt reports 7-10/10 neck pain that is constant and limits sleep and ability to function throughout the day.  Migraines are being managed with botox and medication with neurologist.  Pt demonstrates reduced cervical flexibility with pain in all directions and active trigger points in bil neck, upper traps, Lt periscapular musculature and bil suboccipitals.  Pt with reduced cervical and  thoracic segmental mobility.  Will benefit from skilled PT to address tissue mobility, postural strength and thoracic/cervical mobility to decrease pain and meet functional goals.    Personal Factors and Comorbidities Comorbidity 2    Comorbidities migraines, fibromyalgia,  bipolar disorder, on disability    Examination-Activity Limitations Carry;Sleep;Reach Overhead;Lift    Examination-Participation Restrictions Cleaning;Community Activity;Laundry;Meal Prep    Stability/Clinical Decision Making Evolving/Moderate complexity    Clinical Decision Making Moderate    Rehab Potential Good    PT Frequency 1x / week    PT Duration 8 weeks    PT Treatment/Interventions ADLs/Self Care Home Management;Cryotherapy;Electrical Stimulation;Traction;Moist Heat;Functional mobility training;Therapeutic activities;Therapeutic exercise;Patient/family education;Neuromuscular re-education;Manual techniques;Taping;Dry needling;Passive range of motion;Spinal Manipulations;Joint Manipulations    PT Next Visit Plan DN to Lt rhomboids and subscapularis,  bil upper traps and neck, review HEP and add as appropriate    PT Home Exercise Plan Access Code: [email or web address]    Consulted and Agree with Plan of Care Patient             Patient will benefit from skilled therapeutic intervention in order to improve the following deficits and impairments:  Improper body mechanics, Pain, Postural dysfunction, Increased muscle spasms, Hypomobility, Decreased mobility, Decreased activity tolerance, Decreased range of motion, Impaired flexibility  Visit Diagnosis: Cervicalgia  Migraine without aura and with status migrainosus, not intractable  Cramp and spasm  Abnormal posture  Muscle weakness (generalized)     Problem List Patient Active Problem List   Diagnosis Date Noted   Neck pain 04/26/2021   Splenic vein thrombosis    Alcohol use disorder, moderate, dependence (HCC)    Constipation    Alcohol-induced acute pancreatitis    Alcohol withdrawal syndrome without complication (HCC)    Hyponatremia    Pain of upper abdomen 10/28/2020   Tinea manuum 05/21/2019   Abdominal wall lump 02/21/2018   Chronic fatigue syndrome with fibromyalgia 02/24/2017   Skin lesion of left leg 02/10/2017    History of abnormal cervical Pap smear 02/10/2017   Anxiety 10/15/2014   Depression 10/15/2014   Migraine 10/15/2014   Chronic pain 10/15/2014   Low TSH level 10/15/2014   Tobacco use disorder 10/15/2014   Lorrene Reid, PT 08/11/21 11:18 AM   [facility] @ [facility] [address] [place], [place], [address] Phone: [phone]   Fax:  [phone]  Name: Gail Blackburn MRN: [id number] Date of Birth: 1975/11/18

## 2021-08-17 ENCOUNTER — Other Ambulatory Visit: Payer: Self-pay

## 2021-08-17 ENCOUNTER — Ambulatory Visit: Payer: Medicaid Other | Admitting: Rehabilitative and Restorative Service Providers"

## 2021-08-17 ENCOUNTER — Encounter: Payer: Self-pay | Admitting: Rehabilitative and Restorative Service Providers"

## 2021-08-17 DIAGNOSIS — M62838 Other muscle spasm: Secondary | ICD-10-CM

## 2021-08-17 DIAGNOSIS — G43001 Migraine without aura, not intractable, with status migrainosus: Secondary | ICD-10-CM

## 2021-08-17 DIAGNOSIS — M6281 Muscle weakness (generalized): Secondary | ICD-10-CM | POA: Diagnosis not present

## 2021-08-17 DIAGNOSIS — R252 Cramp and spasm: Secondary | ICD-10-CM | POA: Diagnosis not present

## 2021-08-17 DIAGNOSIS — M5412 Radiculopathy, cervical region: Secondary | ICD-10-CM | POA: Diagnosis not present

## 2021-08-17 DIAGNOSIS — R293 Abnormal posture: Secondary | ICD-10-CM

## 2021-08-17 DIAGNOSIS — M542 Cervicalgia: Secondary | ICD-10-CM

## 2021-08-17 DIAGNOSIS — M7918 Myalgia, other site: Secondary | ICD-10-CM | POA: Diagnosis not present

## 2021-08-17 NOTE — Therapy (Addendum)
[facility] @ [place], [place], [address] Phone: [phone]   Fax:  [phone]  Physical Therapy Treatment  Patient Details  Name: Gail Blackburn MRN: [id number] Date of Birth: 1976-05-12 Referring Provider (PT): Ilda Mori, MD   Encounter Date: 08/17/2021   PT End of Session - 08/17/21 0935     Visit Number 2    Date for PT Re-Evaluation 10/06/21    Authorization Type Medicaid- authorization requested    Authorization - Number of Visits 4    PT Start Time 0930    PT Stop Time 1010    PT Time Calculation (min) 40 min    Activity Tolerance Patient tolerated treatment well    Behavior During Therapy [facility] for tasks assessed/performed             Past Medical History:  Diagnosis Date   Anemia    PRIOR HISTORY   Anxiety    Asthma    Bipolar disorder ([place])    Borderline   Depression    Fibromyalgia    Headache    MIGRAINES   Hypothyroidism 2009   TOOK MEDS FOR FEW WEEKS NO MEDS NOW   Migraine    Thyroid disease    treated in past not sure if too high or too low    Past Surgical History:  Procedure Laterality Date   COLONOSCOPY     DILATION AND EVACUATION N/A 05/06/2016   Procedure: DILATATION AND EVACUATION;  Surgeon: Paula Compton, MD;  Location: [facility];  Service: Gynecology;  Laterality: N/A;   FOOT SURGERY     TEETH PULLED  03/2016   WISDOM TOOTH EXTRACTION      There were no vitals filed for this visit.   Subjective Assessment - 08/17/21 0933     Subjective Reports pain is about the same "I'm just used to it"    Patient Stated Goals reduce neck tension, reduce neck pain and headaches    Currently in Pain? Yes    Pain Score 7     Pain Location Neck    Pain Orientation Right;Left    Pain Descriptors / Indicators Sharp;Other (Comment)   pinching, pulsating, tense   Pain Type Chronic pain                               [facility] Adult PT Treatment/Exercise -  08/17/21 0001       Exercises   Exercises Neck      Neck Exercises: Machines for Strengthening   UBE (Upper Arm Bike) L2.0 x3 min each way      Neck Exercises: Theraband   Rows 20 reps;Red    Shoulder External Rotation 20 reps;Red    Horizontal ABduction 20 reps;Red      Neck Exercises: Seated   Neck Retraction 20 reps;3 secs      Neck Exercises: Stretches   Upper Trapezius Stretch Right;Left;2 reps;20 seconds    Levator Stretch Right;Left;2 reps;20 seconds    Chest Stretch 2 reps;20 seconds    Chest Stretch Limitations in doorway      Manual Therapy   Manual Therapy Soft tissue mobilization;Myofascial release    Manual therapy comments in prone    Soft tissue mobilization soft tissue to B subscapularis and B lats    Myofascial Release manual trigger point relase to B subscapularis and B distal lats.  Trigger point release to R cervical multifidi, B upper  trap, L rhomboids              Trigger Point Dry Needling - 08/17/21 0001     Consent Given? Yes    Education Handout Provided Previously provided    Muscles Treated Head and Neck Upper trapezius;Suboccipitals;Cervical multifidi    Muscles Treated Upper Quadrant Rhomboids    Upper Trapezius Response Twitch reponse elicited    Suboccipitals Response Twitch response elicited    Cervical multifidi Response Twitch reponse elicited    Rhomboids Response Twitch response elicited                     PT Short Term Goals - 08/17/21 1045       PT SHORT TERM GOAL #1   Title be independent in initial HEP    Status On-going      PT SHORT TERM GOAL #2   Title demonstrate cervical A/ROM rotation to > or = to 55 degrees to the Rt to improve safety with driving    Status On-going      PT SHORT TERM GOAL #3   Title report a 30% reduction in neck pain with daily tasks    Status On-going               PT Long Term Goals - 08/11/21 1109       PT LONG TERM GOAL #1   Title be independent in intial HEP     Baseline not currently performing    Time 8    Period Weeks    Status New    Target Date 10/06/21      PT LONG TERM GOAL #2   Title improve FOTO to > or = to 55 to improve function    Baseline 45    Time 8    Period Weeks    Status New    Target Date 10/06/21      PT LONG TERM GOAL #3   Title demonstrate bil cervical A/ROM to > or = to 60 degrees to improve safety with driving    Baseline 40-97 degrees    Time 8    Period Weeks    Status New    Target Date 10/06/21      PT LONG TERM GOAL #4   Title report > or = to 60% reduction in frequency and intensity of neck pain    Baseline constant 7-10/10    Time 8    Period Weeks    Status New    Target Date 10/06/21      PT LONG TERM GOAL #5   Title report waking < or = to 1-2x/night with pain    Baseline 2-3xnight    Time 8    Period Weeks    Status New    Target Date 10/06/21                   Plan - 08/17/21 1033     Clinical Impression Statement Pt presents with forward rounded shoulders.  In prone position, pt with difficulty with arms overhead performing "I's, T's, and Y's".  Following manual trigger point release to B subscapularis and lats, pt with increased ROM and reported feeling looser.  Pt educated to continue stretching program at home for HEP.    PT Treatment/Interventions ADLs/Self Care Home Management;Cryotherapy;Electrical Stimulation;Traction;Moist Heat;Functional mobility training;Therapeutic activities;Therapeutic exercise;Patient/family education;Neuromuscular re-education;Manual techniques;Taping;Dry needling;Passive range of motion;Spinal Manipulations;Joint Manipulations    PT Next Visit Plan DN to Lt rhomboids and  subscapularis, bil upper traps and neck, review HEP and add as appropriate    Consulted and Agree with Plan of Care Patient             Patient will benefit from skilled therapeutic intervention in order to improve the following deficits and impairments:  Improper body  mechanics, Pain, Postural dysfunction, Increased muscle spasms, Hypomobility, Decreased mobility, Decreased activity tolerance, Decreased range of motion, Impaired flexibility  Visit Diagnosis: Cervicalgia  Migraine without aura and with status migrainosus, not intractable  Abnormal posture  Muscle weakness (generalized)  Other muscle spasm     Problem List Patient Active Problem List   Diagnosis Date Noted   Neck pain 04/26/2021   Splenic vein thrombosis    Alcohol use disorder, moderate, dependence ([place])    Constipation    Alcohol-induced acute pancreatitis    Alcohol withdrawal syndrome without complication (HCC)    Hyponatremia    Pain of upper abdomen 10/28/2020   Tinea manuum 05/21/2019   Abdominal wall lump 02/21/2018   Chronic fatigue syndrome with fibromyalgia 02/24/2017   Skin lesion of left leg 02/10/2017   History of abnormal cervical Pap smear 02/10/2017   Anxiety 10/15/2014   Depression 10/15/2014   Migraine 10/15/2014   Chronic pain 10/15/2014   Low TSH level 10/15/2014   Tobacco use disorder 10/15/2014    Juel Burrow, PT, DPT 08/17/2021, 10:46 AM PHYSICAL THERAPY DISCHARGE SUMMARY  Visits from Start of Care: 2  Current functional level related to goals / functional outcomes: See above.  Pt didn't return to PT.    Remaining deficits: See above for most current PT status.     Education / Equipment: HEP   Patient agrees to discharge. Patient goals were not met. Patient is being discharged due to not returning since the last visit.  Sigurd Sos, PT 10/10/21 5:31 PM   [facility] @ [place], [place], [address] Phone: [phone]   Fax:  [phone]  Name: Gail Blackburn MRN: [id number] Date of Birth: 10/02/1975

## 2021-08-24 DIAGNOSIS — M5412 Radiculopathy, cervical region: Secondary | ICD-10-CM | POA: Diagnosis not present

## 2021-08-29 DIAGNOSIS — Z20822 Contact with and (suspected) exposure to covid-19: Secondary | ICD-10-CM | POA: Diagnosis not present

## 2021-08-30 ENCOUNTER — Telehealth: Payer: Self-pay

## 2021-08-30 NOTE — Telephone Encounter (Signed)
Patient calls nurse line reporting home positive covid test yesterday. Patient reports symptom onset today, patient took a test when her fiance tested positive. Patient reports mild runny nose. Patient denies fevers, chills, GI upset, SOB or cough.   Patient was told to contact her PCP for antiviral therapy. Please advise.

## 2021-09-06 ENCOUNTER — Ambulatory Visit: Payer: Medicaid Other

## 2021-09-06 NOTE — Addendum Note (Signed)
Addended by: Edrick Oh on: 09/06/2021 08:08 AM   Modules accepted: Orders

## 2021-10-06 DIAGNOSIS — M47812 Spondylosis without myelopathy or radiculopathy, cervical region: Secondary | ICD-10-CM | POA: Diagnosis not present

## 2021-10-06 DIAGNOSIS — I10 Essential (primary) hypertension: Secondary | ICD-10-CM | POA: Diagnosis not present

## 2021-10-06 DIAGNOSIS — M5412 Radiculopathy, cervical region: Secondary | ICD-10-CM | POA: Diagnosis not present

## 2021-10-06 DIAGNOSIS — R030 Elevated blood-pressure reading, without diagnosis of hypertension: Secondary | ICD-10-CM | POA: Diagnosis not present

## 2021-10-11 ENCOUNTER — Telehealth: Payer: Self-pay | Admitting: Neurology

## 2021-10-11 MED ORDER — BOTOX 100 UNITS IJ SOLR: INTRAMUSCULAR | 1 refills | Status: DC

## 2021-10-11 NOTE — Telephone Encounter (Signed)
Patient has a Botox appointment 2/7. She has changed insurances to [employer]. I called the patient and LVM stating that we will be sending her Botox prescription to [facility] d/t this insurance change. Requested she CB if she has any questions. I have filled out [facility] [employer] PA form and will place in nurse pod for MD signature.

## 2021-10-14 ENCOUNTER — Encounter: Payer: Self-pay | Admitting: Neurology

## 2021-10-14 ENCOUNTER — Telehealth: Payer: Self-pay | Admitting: Neurology

## 2021-10-14 NOTE — Telephone Encounter (Signed)
Received 2 100 unit vials of Botox from [facility].

## 2021-10-14 NOTE — Telephone Encounter (Signed)
Pt request refill for methylPREDNISolone (MEDROL DOSEPAK) 4 MG TBPK tablet at [facility] [phone]

## 2021-10-14 NOTE — Telephone Encounter (Signed)
Sent message to Dr. Lucia Gaskins )pt had emailed as well).

## 2021-10-29 DIAGNOSIS — Z130 Encounter for screening for diseases of the blood and blood-forming organs and certain disorders involving the immune mechanism: Secondary | ICD-10-CM | POA: Diagnosis not present

## 2021-10-29 DIAGNOSIS — Z01419 Encounter for gynecological examination (general) (routine) without abnormal findings: Secondary | ICD-10-CM | POA: Diagnosis not present

## 2021-10-29 DIAGNOSIS — Z3040 Encounter for surveillance of contraceptives, unspecified: Secondary | ICD-10-CM | POA: Diagnosis not present

## 2021-10-29 DIAGNOSIS — Z6821 Body mass index (BMI) 21.0-21.9, adult: Secondary | ICD-10-CM | POA: Diagnosis not present

## 2021-10-29 DIAGNOSIS — Z1389 Encounter for screening for other disorder: Secondary | ICD-10-CM | POA: Diagnosis not present

## 2021-10-29 DIAGNOSIS — A609 Anogenital herpesviral infection, unspecified: Secondary | ICD-10-CM | POA: Diagnosis not present

## 2021-10-29 DIAGNOSIS — Z1231 Encounter for screening mammogram for malignant neoplasm of breast: Secondary | ICD-10-CM | POA: Diagnosis not present

## 2021-10-29 DIAGNOSIS — Z113 Encounter for screening for infections with a predominantly sexual mode of transmission: Secondary | ICD-10-CM | POA: Diagnosis not present

## 2021-11-02 ENCOUNTER — Ambulatory Visit: Payer: Medicaid Other | Admitting: Neurology

## 2021-11-02 DIAGNOSIS — G43711 Chronic migraine without aura, intractable, with status migrainosus: Secondary | ICD-10-CM | POA: Diagnosis not present

## 2021-11-02 NOTE — Progress Notes (Signed)
Botox consent signed   Botox- 100 units x 2 vials Lot: [id number] Expiration: 11/2023 NDC: 6578-4696-29  Bacteriostatic 0.9% Sodium Chloride- 55mL total Lot: [id number] Expiration: 10/27/2022 NDC: 2440-1027-25  Dx: D66.440 S/P

## 2021-11-02 NOTE — Progress Notes (Signed)
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 >50% improvement, she has a lot of neck pain, getting injections at [facility]. Resend to [facility], she requested some dry needling, loved Shanda Bumps.    Consent Form Botulism Toxin Injection For Chronic Migraine    Reviewed orally with patient, additionally signature is on file:  Botulism toxin has been approved by the Federal drug administration for treatment of chronic migraine. Botulism toxin does not cure chronic migraine and it may not be effective in some patients.  The administration of botulism toxin is accomplished by injecting a small amount of toxin into the muscles of the neck and head. Dosage must be titrated for each individual. Any benefits resulting from botulism toxin tend to wear off after 3 months with a repeat injection required if benefit is to be maintained. Injections are usually done every 3-4 months with maximum effect peak achieved by about 2 or 3 weeks. Botulism toxin is expensive and you should be sure of what costs you will incur resulting from the injection.  The side effects of botulism toxin use for chronic migraine may include:   -Transient, and usually mild, facial weakness with facial injections  -Transient, and usually mild, head or neck weakness with head/neck injections  -Reduction or loss of forehead facial animation due to forehead muscle weakness  -Eyelid drooping  -Dry eye  -Pain at the site of injection or bruising at the site of injection  -Double vision  -Potential unknown long term risks   Contraindications: You should not have Botox if you are pregnant, nursing, allergic to albumin, have an infection, skin condition, or muscle weakness at the site of the injection, or have myasthenia gravis, Lambert-Eaton syndrome, or ALS.  It is also possible that as with any injection, there may be an allergic reaction or no effect from the medication. Reduced effectiveness after  repeated injections is sometimes seen and rarely infection at the injection site may occur. All care will be taken to prevent these side effects. If therapy is given over a long time, atrophy and wasting in the muscle injected may occur. Occasionally the patient's become refractory to treatment because they develop antibodies to the toxin. In this event, therapy needs to be modified.  I have read the above information and consent to the administration of botulism toxin.    BOTOX PROCEDURE NOTE FOR MIGRAINE HEADACHE  Contraindications and precautions discussed with patient(above). Aseptic procedure was observed and patient tolerated procedure. Procedure performed by Shawnie Dapper, FNP-C.   The condition has existed for more than 6 months, and pt does not have a diagnosis of ALS, Myasthenia Gravis or Lambert-Eaton Syndrome.  Risks and benefits of injections discussed and pt agrees to proceed with the procedure.  Written consent obtained  These injections are medically necessary. Pt  receives good benefits from these injections. These injections do not cause sedations or hallucinations which the oral therapies may cause.   Description of procedure:  The patient was placed in a sitting position. The standard protocol was used for Botox as follows, with 5 units of Botox injected at each site:  -Procerus muscle, midline injection  -Corrugator muscle, bilateral injection  -Frontalis muscle, bilateral injection, with 2 sites each side, medial injection was performed in the upper one third of the frontalis muscle, in the region vertical from the medial inferior edge of the superior orbital rim. The lateral injection was again in the upper one third of the forehead vertically above the lateral limbus of  the cornea, 1.5 cm lateral to the medial injection site.  -Temporalis muscle injection, 4 sites, bilaterally. The first injection was 3 cm above the tragus of the ear, second injection site was 1.5 cm to 3  cm up from the first injection site in line with the tragus of the ear. The third injection site was 1.5-3 cm forward between the first 2 injection sites. The fourth injection site was 1.5 cm posterior to the second injection site. 5th site laterally in the temporalis  muscleat the level of the outer canthus.  -Occipitalis muscle injection, 3 sites, bilaterally. The first injection was done one half way between the occipital protuberance and the tip of the mastoid process behind the ear. The second injection site was done lateral and superior to the first, 1 fingerbreadth from the first injection. The third injection site was 1 fingerbreadth superiorly and medially from the first injection site.  -Cervical paraspinal muscle injection, 2 sites, bilaterally. The first injection site was 1 cm from the midline of the cervical spine, 3 cm inferior to the lower border of the occipital protuberance. The second injection site was 1.5 cm superiorly and laterally to the first injection site.  -Trapezius muscle injection was performed at 3 sites, bilaterally. The first injection site was in the upper trapezius muscle halfway between the inflection point of the neck, and the acromion. The second injection site was one half way between the acromion and the first injection site. The third injection was done between the first injection site and the inflection point of the neck.     Will return for repeat injection in 3 months.   A total of 155 units of Botox was prepared, 45 units of Botox was injected as documented above, any Botox not injected was wasted. The patient tolerated the procedure well, there were no complications of the above procedure.

## 2021-11-04 DIAGNOSIS — Z79891 Long term (current) use of opiate analgesic: Secondary | ICD-10-CM | POA: Diagnosis not present

## 2021-11-24 DIAGNOSIS — M47812 Spondylosis without myelopathy or radiculopathy, cervical region: Secondary | ICD-10-CM | POA: Diagnosis not present

## 2021-11-24 DIAGNOSIS — M5412 Radiculopathy, cervical region: Secondary | ICD-10-CM | POA: Diagnosis not present

## 2021-11-25 ENCOUNTER — Telehealth: Payer: Self-pay

## 2021-11-25 NOTE — Telephone Encounter (Signed)
PA for emgality has been approved This request has received a Favorable outcome. ? ?Please note any additional information provided by the [employer] at the bottom of your screen. ? ?Request Reference Number: [id number]. EMGALITY INJ 120MG /ML is approved through 11/26/2022. For further questions, call 01/26/2023 at [phone]. ?

## 2021-11-25 NOTE — Telephone Encounter (Signed)
PA for emgality has been sent via cover my meds. ?(Key: [id number]) ? ?Your information has been sent to [employer]. ?

## 2021-12-01 DIAGNOSIS — M47812 Spondylosis without myelopathy or radiculopathy, cervical region: Secondary | ICD-10-CM | POA: Diagnosis not present

## 2021-12-16 MED ORDER — BOTOX 100 UNITS IJ SOLR: INTRAMUSCULAR | 3 refills | Status: DC

## 2021-12-16 NOTE — Telephone Encounter (Signed)
Can you please send patient's Botox prescription to [facility]? ?

## 2021-12-16 NOTE — Addendum Note (Signed)
Addended by: Bertram Savin on: 12/16/2021 11:52 AM ? ? Modules accepted: Orders ? ?

## 2021-12-20 NOTE — Telephone Encounter (Signed)
Faxed signed PA form with OV notes to [facility] (MEDICAID). ? ?

## 2021-12-23 DIAGNOSIS — M47812 Spondylosis without myelopathy or radiculopathy, cervical region: Secondary | ICD-10-CM | POA: Diagnosis not present

## 2022-01-06 MED ORDER — BOTOX 100 UNITS IJ SOLR: INTRAMUSCULAR | 3 refills | Status: DC

## 2022-01-06 NOTE — Telephone Encounter (Signed)
Please send Botox RX refill to [facility]. ?

## 2022-01-06 NOTE — Telephone Encounter (Signed)
Received approval from [facility], [place] # [id number] (12/20/2021-12/21/2022). ?

## 2022-01-06 NOTE — Addendum Note (Signed)
Addended by: Bertram Savin on: 01/06/2022 11:29 AM ? ? Modules accepted: Orders ? ?

## 2022-01-06 NOTE — Telephone Encounter (Signed)
Botox refill sent to [facility]. ?

## 2022-01-12 ENCOUNTER — Telehealth: Payer: Self-pay | Admitting: Family Medicine

## 2022-01-12 NOTE — Telephone Encounter (Signed)
.. ?  Medicaid Managed Care  ? ?Unsuccessful Outreach Note ? ?01/12/2022 ?Name: Gail Blackburn MRN: [id number] DOB: Feb 08, 1976 ? ?Referred by: Sharion Settler, DO ?Reason for referral : High Risk Managed Medicaid (I called the patient today toget her scheduled with the MM Team. I left my name and number on her VM.) ? ? ?An unsuccessful telephone outreach was attempted today. The patient was referred to the case management team for assistance with care management and care coordination.  ? ?Follow Up Plan: The care management team will reach out to the patient again over the next 14 days.  ? ? ?Reita Chard ?Care Guide, High Risk Medicaid Managed Care ?Embedded Care Coordination ?[place]  ? ? ?SIGNATURE  ?

## 2022-01-20 NOTE — Telephone Encounter (Signed)
Received (2) 100 unit vials of Botox from [facility]. 

## 2022-01-26 ENCOUNTER — Ambulatory Visit: Payer: Medicaid Other | Admitting: Neurology

## 2022-01-26 DIAGNOSIS — G43711 Chronic migraine without aura, intractable, with status migrainosus: Secondary | ICD-10-CM

## 2022-01-26 MED ORDER — METHYLPREDNISOLONE 4 MG PO TBPK: ORAL_TABLET | ORAL | 1 refills | Status: DC

## 2022-01-26 MED ORDER — CYCLOBENZAPRINE HCL 10 MG PO TABS: 10.0000 mg | ORAL_TABLET | Freq: Three times a day (TID) | ORAL | 3 refills | Status: DC | PRN

## 2022-01-26 MED ORDER — AJOVY 225 MG/1.5ML ~~LOC~~ SOAJ: 225.0000 mg | SUBCUTANEOUS | 11 refills | Status: DC

## 2022-01-26 NOTE — Progress Notes (Signed)
Botox consent signed ? ?Botox- 200 units x 1 vial ?Lot: [id number] ?Expiration: 05/2024 ?NDC: 4825-0037-04 ? ?0.9% Sodium Chloride- 63mL total ?Lot: [id number] ?Expiration: 10/27/2022 ?NDC: 9450-3888-28 ? ?Dx: M03.491 ?S/P  ?

## 2022-01-26 NOTE — Progress Notes (Signed)
? ?01/26/2022: Botox gives > 50% relief but still having 8 migraine days a month and > 15 headache days a month will try Ajovy, emgality not helping, aimovig contraindicated due to constipation. ? ?Meds ordered this encounter  ?Medications  ? Fremanezumab-vfrm (AJOVY) 225 MG/1.5ML SOAJ  ?  Sig: Inject 225 mg into the skin every 30 (thirty) days.  ?  Dispense:  1.5 mL  ?  Refill:  11  ? methylPREDNISolone (MEDROL DOSEPAK) 4 MG TBPK tablet  ?  Sig: Take pills all together daily with food for 6 days. Take first dose as soon as possible. Take other doses in the morning. 6-5-4-3-2-1.  ?  Dispense:  21 tablet  ?  Refill:  1  ? cyclobenzaprine (FLEXERIL) 10 MG tablet  ?  Sig: Take 1 tablet (10 mg total) by mouth 3 (three) times daily as needed for muscle spasms. Do not take with xanax or gabapentin to avoid significant sedation.  ?  Dispense:  90 tablet  ?  Refill:  3  ? ? ? 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 >50% improvement, she has a lot of neck pain, getting injections at [facility]. Resend to [facility], she requested some dry needling, loved Shanda Bumps.  ? ? ?Consent Form ?Botulism Toxin Injection For Chronic Migraine ? ? ? ?Reviewed orally with patient, additionally signature is on file: ? ?Botulism toxin has been approved by the Federal drug administration for treatment of chronic migraine. Botulism toxin does not cure chronic migraine and it may not be effective in some patients. ? ?The administration of botulism toxin is accomplished by injecting a small amount of toxin into the muscles of the neck and head. Dosage must be titrated for each individual. Any benefits resulting from botulism toxin tend to wear off after 3 months with a repeat injection required if benefit is to be maintained. Injections are usually done every 3-4 months with maximum effect peak achieved by about 2 or 3 weeks. Botulism toxin is expensive and you should be sure of what costs you will incur resulting  from the injection. ? ?The side effects of botulism toxin use for chronic migraine may include: ? ? -Transient, and usually mild, facial weakness with facial injections ? -Transient, and usually mild, head or neck weakness with head/neck injections ? -Reduction or loss of forehead facial animation due to forehead muscle weakness ? -Eyelid drooping ? -Dry eye ? -Pain at the site of injection or bruising at the site of injection ? -Double vision ? -Potential unknown long term risks ? ? ?Contraindications: You should not have Botox if you are pregnant, nursing, allergic to albumin, have an infection, skin condition, or muscle weakness at the site of the injection, or have myasthenia gravis, Lambert-Eaton syndrome, or ALS. ? ?It is also possible that as with any injection, there may be an allergic reaction or no effect from the medication. Reduced effectiveness after repeated injections is sometimes seen and rarely infection at the injection site may occur. All care will be taken to prevent these side effects. If therapy is given over a long time, atrophy and wasting in the muscle injected may occur. Occasionally the patient's become refractory to treatment because they develop antibodies to the toxin. In this event, therapy needs to be modified. ? ?I have read the above information and consent to the administration of botulism toxin. ? ? ? ?BOTOX PROCEDURE NOTE FOR MIGRAINE HEADACHE ? ?Contraindications and precautions discussed with patient(above). Aseptic rocedure was observed and  patient tolerated procedure.  ? ?The condition has existed for more than 6 months, and pt does not have a diagnosis of ALS, Myasthenia Gravis or Lambert-Eaton Syndrome.  Risks and benefits of injections discussed and pt agrees to proceed with the procedure.  Written consent obtained ? ?These injections are medically necessary. Pt  receives good benefits from these injections. These injections do not cause sedations or hallucinations which  the oral therapies may cause. ? ? ?Description of procedure: ? ?The patient was placed in a sitting position. The standard protocol was used for Botox as follows, with 5 units of Botox injected at each site: ? ?-Procerus muscle, midline injection ? ?-Corrugator muscle, bilateral injection ? ?-Frontalis muscle, bilateral injection, with 2 sites each side, medial injection was performed in the upper one third of the frontalis muscle, in the region vertical from the medial inferior edge of the superior orbital rim. The lateral injection was again in the upper one third of the forehead vertically above the lateral limbus of the cornea, 1.5 cm lateral to the medial injection site. ? ?-Temporalis muscle injection, 4 sites, bilaterally. The first injection was 3 cm above the tragus of the ear, second injection site was 1.5 cm to 3 cm up from the first injection site in line with the tragus of the ear. The third injection site was 1.5-3 cm forward between the first 2 injection sites. The fourth injection site was 1.5 cm posterior to the second injection site. 5th site laterally in the temporalis  muscleat the level of the outer canthus. ? ?-Occipitalis muscle injection, 3 sites, bilaterally. The first injection was done one half way between the occipital protuberance and the tip of the mastoid process behind the ear. The second injection site was done lateral and superior to the first, 1 fingerbreadth from the first injection. The third injection site was 1 fingerbreadth superiorly and medially from the first injection site. ? ?-Cervical paraspinal muscle injection, 2 sites, bilaterally. The first injection site was 1 cm from the midline of the cervical spine, 3 cm inferior to the lower border of the occipital protuberance. The second injection site was 1.5 cm superiorly and laterally to the first injection site. ? ?-Trapezius muscle injection was performed at 3 sites, bilaterally. The first injection site was in the upper  trapezius muscle halfway between the inflection point of the neck, and the acromion. The second injection site was one half way between the acromion and the first injection site. The third injection was done between the first injection site and the inflection point of the neck. ? ?Will return for repeat injection in 3 months. ? ? ?A total of 155 units of Botox was prepared, 45 units of Botox was injected as documented above, any Botox not injected was wasted. The patient tolerated the procedure well, there were no complications of the above procedure. ? ? ?

## 2022-01-29 IMAGING — MR MR ABDOMEN WO/W CM
17 of 19 series · 40 of 48 positions shown · IV contrast (gadavist)
Comparison: CT abdomen 11/07/2020
COMPARISON: CT abdomen 11/07/2020

Addendum:
CLINICAL DATA: Liver lesion. Worsening abdominal pain. Recent
pancreatitis and nonocclusive splenic vein thrombosis.

EXAM:
MRI ABDOMEN WITHOUT AND WITH CONTRAST
TECHNIQUE: Multiplanar multisequence MR imaging of the abdomen was performed
both before and after the administration of intravenous contrast.
CONTRAST:  6.5mL GADAVIST GADOBUTROL 1 MMOL/ML IV SOLN

[Series 4: ax haste · axial · 6.0mm · 1.00mm/px · 1 of 32 slices shown]
[im 1/32]
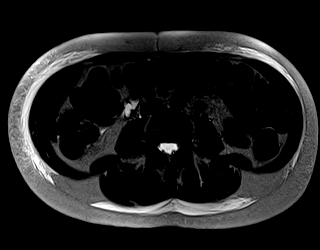

[Series 5: cor haste · coronal · 6.0mm · 1.00mm/px · 1 of 30 slices shown]
[im 1/30]
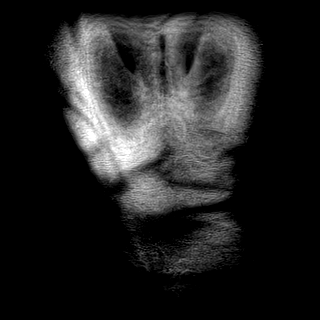

[Series 6: t1_vibe_opp-in_tra_p4_bh · axial · 3.0mm · 1.00mm/px · z∈[-165,+72]mm · 3 of 80 slices shown (1 of 4)]
[im 1/80]
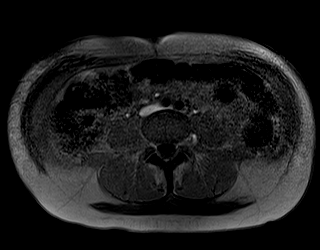
[im 40/80]
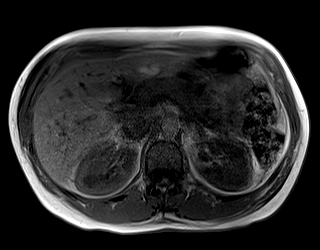
[im 80/80]
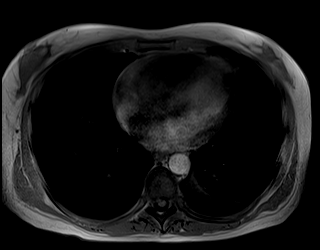

[Series 6: t1_vibe_opp-in_tra_p4_bh · axial · 3.0mm · 1.00mm/px · z∈[-165,+72]mm · 3 of 80 slices shown (2 of 4)]
[im 1/80]
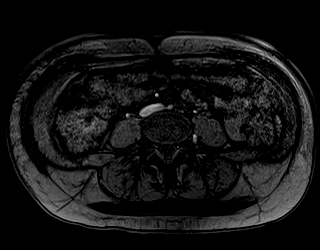
[im 40/80]
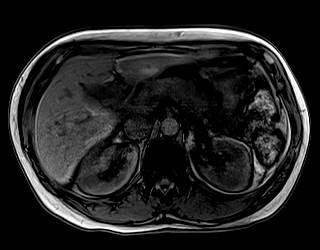
[im 80/80]
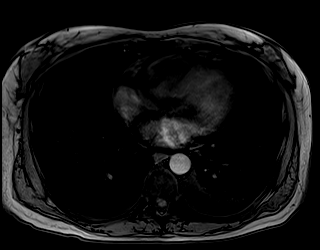

[Series 9: T2 fat-sat · axial · 6.0mm · 1.06mm/px · 1 of 36 slices shown]
[im 1/36]
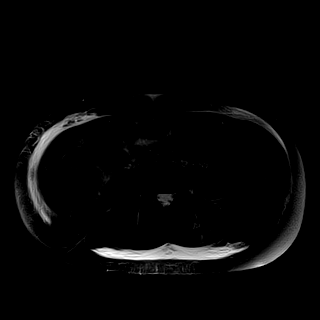

[Series 10: DWI · axial · 6.0mm · 1.42mm/px · z∈[-158,+65]mm · 4 of 96 slices shown (1 of 2)]
[im 1/96]
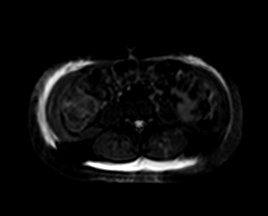
[im 32/96]
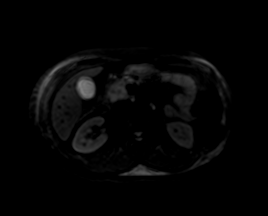
[im 64/96]
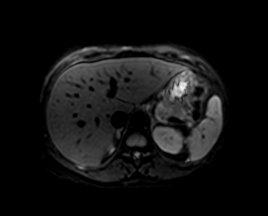
[im 96/96]
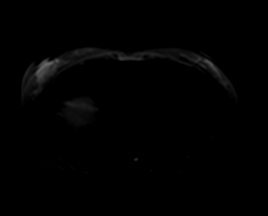

[Series 11: DWI · axial · 6.0mm · 1.42mm/px · 1 of 32 slices shown (2 of 2)]
[im 1/32]
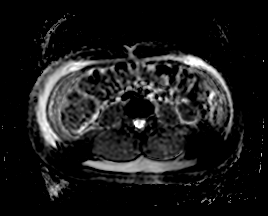

[Series 14: bSSFP · axial · 6.0mm · 0.62mm/px · 1 of 32 slices shown]
[im 1/32]
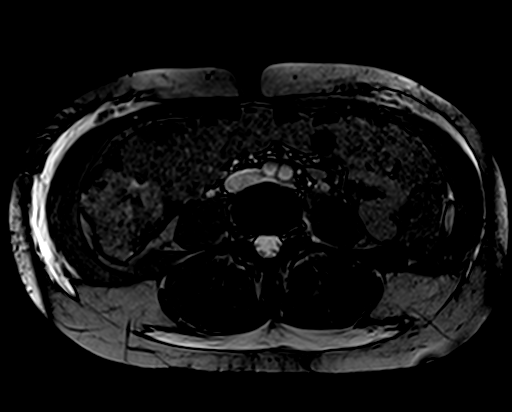

[Series 15: t1_vibe_opp-in_tra_p4_bh · axial · 3.0mm · 1.06mm/px · z∈[-165,+72]mm · 3 of 80 slices shown (3 of 4)]
[im 1/80]
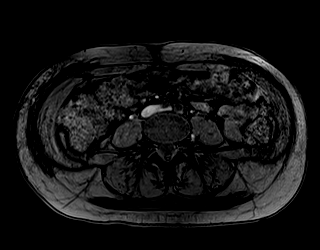
[im 40/80]
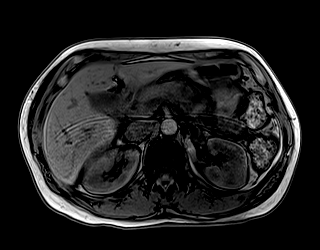
[im 80/80]
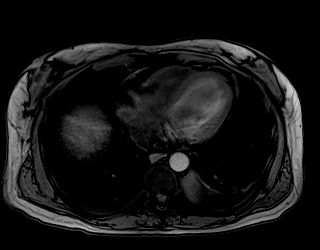

[Series 15: t1_vibe_opp-in_tra_p4_bh · axial · 3.0mm · 1.06mm/px · z∈[-165,+72]mm · 3 of 80 slices shown (4 of 4)]
[im 1/80]
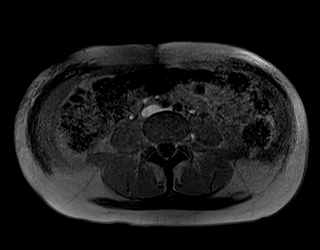
[im 40/80]
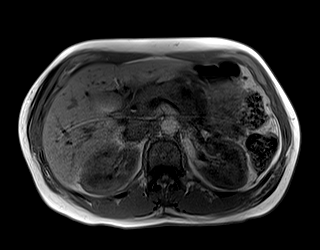
[im 80/80]
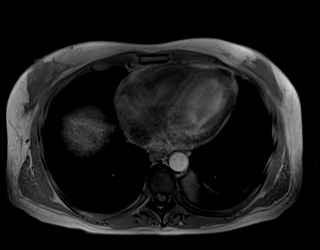

[Series 18: t1_vibe_fs_tra_p4_bh_post · axial · 3.0mm · 1.06mm/px · z∈[-166,+71]mm · 3 of 80 slices shown (1 of 2)]
[im 1/80]
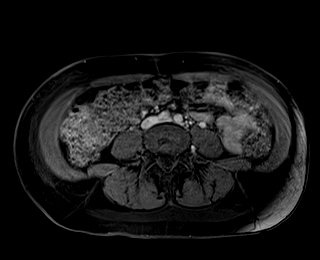
[im 40/80]
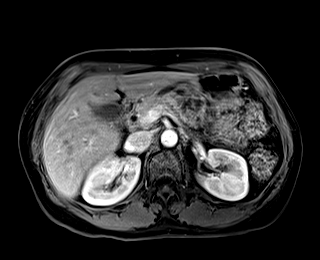
[im 80/80]
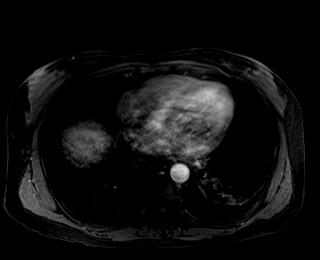

[Series 19: t1_vibe_fs_tra_p4_bh_post_sub · axial · 3.0mm · 1.06mm/px · z∈[-166,+71]mm · 3 of 80 slices shown (1 of 2)]
[im 1/80]
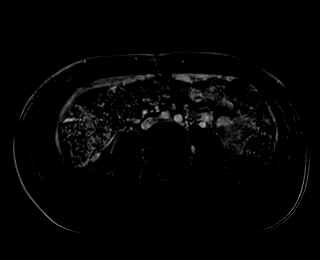
[im 40/80]
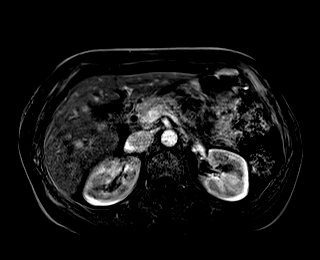
[im 80/80]
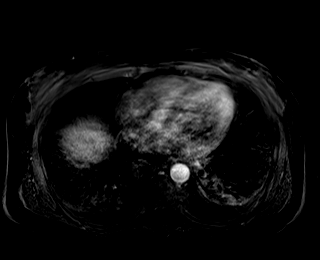

[Series 20: t1_vibe_fs_tra_p4_bh_post · axial · 3.0mm · 1.06mm/px · z∈[-166,+71]mm · 3 of 80 slices shown (2 of 2)]
[im 1/80]
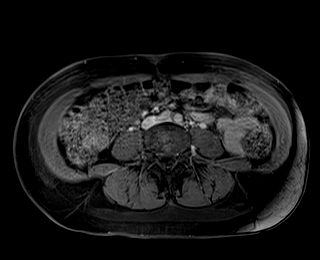
[im 40/80]
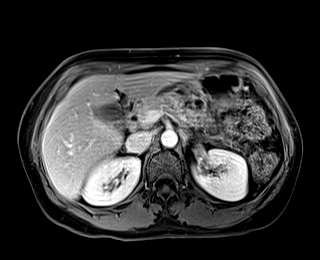
[im 80/80]
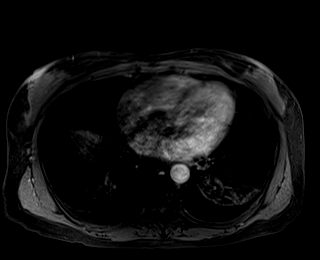

[Series 21: t1_vibe_fs_tra_p4_bh_post_sub · axial · 3.0mm · 1.06mm/px · z∈[-166,+71]mm · 3 of 80 slices shown (2 of 2)]
[im 1/80]
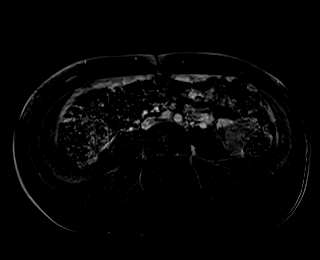
[im 40/80]
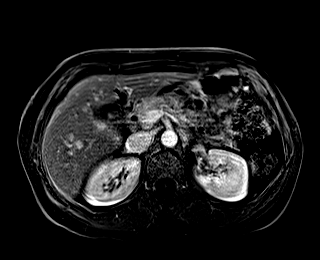
[im 80/80]
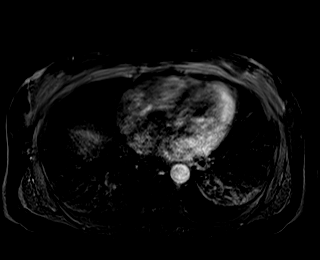

[Series 22: t1_vibe_fs_tra_p4_bh_post 3min · axial · 3.0mm · 1.06mm/px · z∈[-166,+71]mm · 3 of 80 slices shown]
[im 1/80]
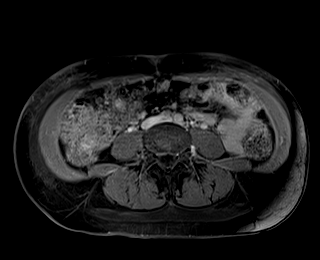
[im 40/80]
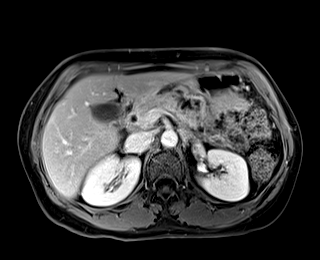
[im 80/80]
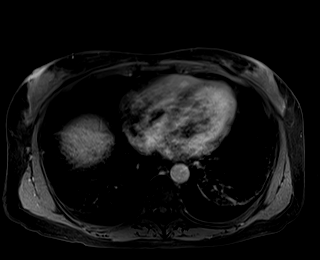

[Series 23: t1_vibe_fs_tra_p4_bh_post 3min_sub · axial · 3.0mm · 1.06mm/px · z∈[-166,+71]mm · 3 of 80 slices shown]
[im 1/80]
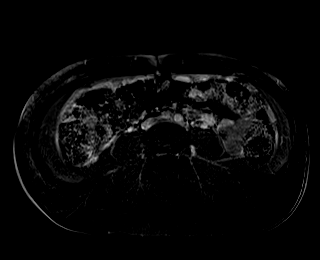
[im 40/80]
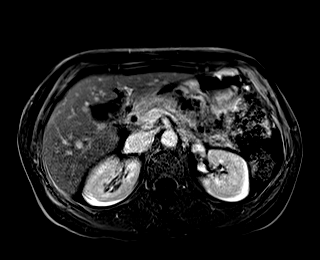
[im 80/80]
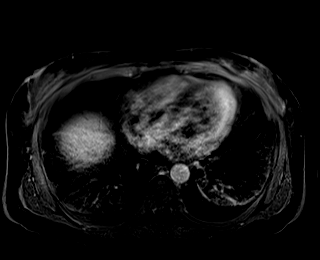

[Series 24: t1_vibe_fs_tra_p4_bh_post 5min · axial · 3.0mm · 1.06mm/px · 1 of 80 slices shown]
[im 1/80]
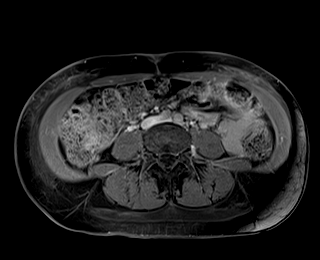

[40 of 48 positions shown; findings below may reference images not displayed]

FINDINGS: Despite efforts by the technologist and patient, motion artifact is
present on today's exam and could not be eliminated. This reduces
exam sensitivity and specificity.

Lower chest: Small bilateral pleural effusions.

Hepatobiliary: In 1 of the areas of concern in the right hepatic
lobe about at the level of the gallbladder fossa laterally, there is
a suggestion of very subtle accentuated T2 signal in the parenchyma
on image 19 of series 9 and some equivocal accentuated delayed
enhancement for example on image 41 of series 24, but otherwise this
lesion is essentially occult on MRI. The lesion does not have
worrisome characteristics and presumably could represent a tiny
focus of subcapsular fibrosis or equivocal local inflammation or
injury.

Of other areas of hypodensity closer to the dome of the right
hepatic lobe on recent CT have no MRI lesion correlate and are
likely benign/incidental. The gallbladder appears unremarkable.
Common bile duct 0.5 cm in diameter, without a well-defined filling
defect and with normal distal tapering appearance.

Pancreas: Pancreatic and peripancreatic inflammation noted. There is
suspicion for early pseudocyst formation along the anterior and
inferior margin of the pancreatic body, with an irregular collection
of tubular fluid signal with enhancing margins measuring about
by 1.9 by 1.3 cm, for example shown on images 46-57 of series 26.
Given the enhancement of the margins, these are probably early
pseudocysts rather than simply acute fluid collections. I not see
compelling findings of pancreatic necrosis, and there is no gas in
these lesions to further suggest pancreatic abscess.

Spleen:  Unremarkable

Adrenals/Urinary Tract:  Unremarkable

Stomach/Bowel: Unremarkable

Vascular/Lymphatic: There continues to be some localized narrowing
in the left renal vein along the pancreatic tail suspicious for
nonocclusive thrombus. This does not appear changed in configuration
compared to the earlier CT scan.

Other: Trace free fluid along the right hepatic lobe margin.
Subcutaneous edema along the flanks and lateral abdominal wall
musculature.

Musculoskeletal: Unremarkable
IMPRESSION: 1. Pancreatic and peripancreatic inflammation with early pseudocyst
formation along the anterior and inferior margin of the pancreatic
body. No findings of pancreatic necrosis.
2. Stable nonocclusive thrombus in the left renal vein along the
pancreatic tail, similar to the earlier CT scan.
3. In Nala of the areas of concern in the right hepatic lobe about
the level of the gallbladder fossa laterally, there is a suggestion
of very subtle 0.8 cm focus of accentuated T2 signal in the
parenchyma and some equivocal accentuated delayed enhancement. This
presumably could represent a tiny focus of subcapsular fibrosis or
equivocal local inflammation or injury. The other areas of
hypodensity closer to the dome of the right hepatic lobe on recent
CT have no MRI characteristics and are likely benign/incidental.
4. Small bilateral pleural effusions. Subcutaneous edema along the
flanks and lateral abdominal wall musculature.
5. Common bile duct 0.5 cm in diameter, without a well-defined
filling defect and with normal distal tapering appearance.
6. Despite efforts by the technologist and patient, motion artifact
is present on today's exam and could not be eliminated. This reduces
exam sensitivity and specificity.

ADDENDUM:
The original report was by Dr. Lorraine Jim. The following
addendum is by Dr. Lorraine Jim:

Correction to the original report, the small amount of nonocclusive
thrombus is in the SPLENIC vein, not the left renal vein. This small
amount of nonocclusive thrombus does not appear appreciably changed
compared to the CT from 11/07/2020. This was communicated with the
[REDACTED] team at [DATE] a.m. on 11/09/2020.

*** End of Addendum ***
FINDINGS: Despite efforts by the technologist and patient, motion artifact is
present on today's exam and could not be eliminated. This reduces
exam sensitivity and specificity.

Lower chest: Small bilateral pleural effusions.

Hepatobiliary: In 1 of the areas of concern in the right hepatic
lobe about at the level of the gallbladder fossa laterally, there is
a suggestion of very subtle accentuated T2 signal in the parenchyma
on image 19 of series 9 and some equivocal accentuated delayed
enhancement for example on image 41 of series 24, but otherwise this
lesion is essentially occult on MRI. The lesion does not have
worrisome characteristics and presumably could represent a tiny
focus of subcapsular fibrosis or equivocal local inflammation or
injury.

Of other areas of hypodensity closer to the dome of the right
hepatic lobe on recent CT have no MRI lesion correlate and are
likely benign/incidental. The gallbladder appears unremarkable.
Common bile duct 0.5 cm in diameter, without a well-defined filling
defect and with normal distal tapering appearance.

Pancreas: Pancreatic and peripancreatic inflammation noted. There is
suspicion for early pseudocyst formation along the anterior and
inferior margin of the pancreatic body, with an irregular collection
of tubular fluid signal with enhancing margins measuring about
by 1.9 by 1.3 cm, for example shown on images 46-57 of series 26.
Given the enhancement of the margins, these are probably early
pseudocysts rather than simply acute fluid collections. I not see
compelling findings of pancreatic necrosis, and there is no gas in
these lesions to further suggest pancreatic abscess.

Spleen:  Unremarkable

Adrenals/Urinary Tract:  Unremarkable

Stomach/Bowel: Unremarkable

Vascular/Lymphatic: There continues to be some localized narrowing
in the left renal vein along the pancreatic tail suspicious for
nonocclusive thrombus. This does not appear changed in configuration
compared to the earlier CT scan.

Other: Trace free fluid along the right hepatic lobe margin.
Subcutaneous edema along the flanks and lateral abdominal wall
musculature.

Musculoskeletal: Unremarkable
IMPRESSION: 1. Pancreatic and peripancreatic inflammation with early pseudocyst
formation along the anterior and inferior margin of the pancreatic
body. No findings of pancreatic necrosis.
2. Stable nonocclusive thrombus in the left renal vein along the
pancreatic tail, similar to the earlier CT scan.
3. In Nala of the areas of concern in the right hepatic lobe about
the level of the gallbladder fossa laterally, there is a suggestion
of very subtle 0.8 cm focus of accentuated T2 signal in the
parenchyma and some equivocal accentuated delayed enhancement. This
presumably could represent a tiny focus of subcapsular fibrosis or
equivocal local inflammation or injury. The other areas of
hypodensity closer to the dome of the right hepatic lobe on recent
CT have no MRI characteristics and are likely benign/incidental.
4. Small bilateral pleural effusions. Subcutaneous edema along the
flanks and lateral abdominal wall musculature.
5. Common bile duct 0.5 cm in diameter, without a well-defined
filling defect and with normal distal tapering appearance.
6. Despite efforts by the technologist and patient, motion artifact
is present on today's exam and could not be eliminated. This reduces
exam sensitivity and specificity.

## 2022-01-31 ENCOUNTER — Telehealth: Payer: Self-pay | Admitting: *Deleted

## 2022-01-31 NOTE — Telephone Encounter (Signed)
Gail Blackburn (Key: [id number]) ?Rx #: [id number] ?AJOVY (fremanezumab-vfrm) injection 225MG /1.5ML auto-injectors ? ?PA Ajovy complete  Waiting on approval  ?

## 2022-02-02 DIAGNOSIS — Z79899 Other long term (current) drug therapy: Secondary | ICD-10-CM | POA: Diagnosis not present

## 2022-03-01 ENCOUNTER — Encounter: Payer: Self-pay | Admitting: *Deleted

## 2022-03-21 ENCOUNTER — Other Ambulatory Visit: Payer: Self-pay | Admitting: Neurology

## 2022-03-21 DIAGNOSIS — G43709 Chronic migraine without aura, not intractable, without status migrainosus: Secondary | ICD-10-CM

## 2022-03-21 MED ORDER — PROPRANOLOL HCL ER 60 MG PO CP24: 60.0000 mg | ORAL_CAPSULE | Freq: Every day | ORAL | 1 refills | Status: DC

## 2022-03-21 MED ORDER — RIZATRIPTAN BENZOATE 10 MG PO TBDP: ORAL_TABLET | ORAL | 5 refills | Status: AC

## 2022-03-24 ENCOUNTER — Emergency Department (HOSPITAL_COMMUNITY): Payer: Medicaid Other

## 2022-03-24 ENCOUNTER — Ambulatory Visit (HOSPITAL_COMMUNITY)
Admission: EM | Admit: 2022-03-24 | Discharge: 2022-03-24 | Disposition: A | Discharge status: Home / Self Care | Payer: Medicaid Other | Attending: Internal Medicine | Admitting: Internal Medicine

## 2022-03-24 ENCOUNTER — Encounter: Payer: Self-pay | Admitting: Physician Assistant

## 2022-03-24 ENCOUNTER — Encounter (HOSPITAL_COMMUNITY): Payer: Self-pay

## 2022-03-24 ENCOUNTER — Emergency Department (HOSPITAL_COMMUNITY)
Admission: EM | Admit: 2022-03-24 | Discharge: 2022-03-24 | Disposition: A | Discharge status: Home / Self Care | Payer: Medicaid Other | Attending: Emergency Medicine | Admitting: Emergency Medicine

## 2022-03-24 ENCOUNTER — Other Ambulatory Visit: Payer: Self-pay

## 2022-03-24 DIAGNOSIS — Z91040 Latex allergy status: Secondary | ICD-10-CM | POA: Diagnosis not present

## 2022-03-24 DIAGNOSIS — D72829 Elevated white blood cell count, unspecified: Secondary | ICD-10-CM | POA: Insufficient documentation

## 2022-03-24 DIAGNOSIS — R110 Nausea: Secondary | ICD-10-CM | POA: Insufficient documentation

## 2022-03-24 DIAGNOSIS — R1011 Right upper quadrant pain: Secondary | ICD-10-CM | POA: Diagnosis not present

## 2022-03-24 LAB — COMPREHENSIVE METABOLIC PANEL
ALT: 12 U/L (ref 0–44)
AST: 14 U/L — ABNORMAL LOW (ref 15–41)
Albumin: 4.6 g/dL (ref 3.5–5.0)
Alkaline Phosphatase: 56 U/L (ref 38–126)
Anion gap: 10 (ref 5–15)
BUN: 9 mg/dL (ref 6–20)
CO2: 24 mmol/L (ref 22–32)
Calcium: 10 mg/dL (ref 8.9–10.3)
Chloride: 104 mmol/L (ref 98–111)
Creatinine, Ser: 0.74 mg/dL (ref 0.44–1.00)
GFR, Estimated: >60 mL/min (ref >60)
Glucose, Bld: 101 mg/dL — ABNORMAL HIGH (ref 70–99)
Potassium: 3.7 mmol/L (ref 3.5–5.1)
Sodium: 138 mmol/L (ref 135–145)
Total Bilirubin: 1.1 mg/dL (ref 0.3–1.2)
Total Protein: 7.2 g/dL (ref 6.5–8.1)

## 2022-03-24 LAB — CBC
HCT: 40.5 % (ref 36.0–46.0)
Hemoglobin: 13.4 g/dL (ref 12.0–15.0)
MCH: 30.5 pg (ref 26.0–34.0)
MCHC: 33.1 g/dL (ref 30.0–36.0)
MCV: 92 fL (ref 80.0–100.0)
Platelets: 262 10*3/uL (ref 150–400)
RBC: 4.4 MIL/uL (ref 3.87–5.11)
RDW: 11.6 % (ref 11.5–15.5)
WBC: 7.8 10*3/uL (ref 4.0–10.5)
nRBC: 0 % (ref 0.0–0.2)

## 2022-03-24 LAB — I-STAT BETA HCG BLOOD, ED (MC, WL, AP ONLY): I-stat hCG, quantitative: <5 m[IU]/mL (ref <5)

## 2022-03-24 LAB — URINALYSIS, ROUTINE W REFLEX MICROSCOPIC
Bilirubin Urine: NEGATIVE
Glucose, UA: NEGATIVE mg/dL
Hgb urine dipstick: NEGATIVE
Ketones, ur: 20 mg/dL — AB
Leukocytes,Ua: NEGATIVE
Nitrite: NEGATIVE
Protein, ur: NEGATIVE mg/dL
Specific Gravity, Urine: 1.016 (ref 1.005–1.030)
pH: 5 (ref 5.0–8.0)

## 2022-03-24 LAB — LIPASE, BLOOD: Lipase: 39 U/L (ref 11–51)

## 2022-03-24 MED ORDER — ACETAMINOPHEN 500 MG PO TABS
1000.0000 mg | ORAL_TABLET | ORAL | Status: AC
  Administered 2022-03-24: 1000 mg via ORAL
  Filled 2022-03-24: qty 2

## 2022-03-24 MED ORDER — OMEPRAZOLE 20 MG PO CPDR: 20.0000 mg | DELAYED_RELEASE_CAPSULE | Freq: Every day | ORAL | 0 refills | Status: DC

## 2022-03-24 MED ORDER — DICYCLOMINE HCL 20 MG PO TABS: 20.0000 mg | ORAL_TABLET | Freq: Two times a day (BID) | ORAL | 0 refills | Status: DC

## 2022-03-24 MED ORDER — DIPHENHYDRAMINE HCL 25 MG PO CAPS
25.0000 mg | ORAL_CAPSULE | Freq: Once | ORAL | Status: AC
  Administered 2022-03-24: 25 mg via ORAL
  Filled 2022-03-24: qty 1

## 2022-03-24 MED ORDER — METOCLOPRAMIDE HCL 5 MG/ML IJ SOLN
10.0000 mg | Freq: Once | INTRAMUSCULAR | Status: AC
Start: 2022-03-24 — End: 2022-03-24
  Administered 2022-03-24: 10 mg via INTRAVENOUS
  Filled 2022-03-24: qty 2

## 2022-03-24 MED ORDER — PANTOPRAZOLE SODIUM 40 MG IV SOLR
40.0000 mg | Freq: Once | INTRAVENOUS | Status: AC
  Administered 2022-03-24: 40 mg via INTRAVENOUS
  Filled 2022-03-24: qty 10

## 2022-03-24 MED ORDER — LACTATED RINGERS IV BOLUS
1000.0000 mL | Freq: Once | INTRAVENOUS | Status: AC
  Administered 2022-03-24: 1000 mL via INTRAVENOUS

## 2022-03-24 MED ORDER — METOCLOPRAMIDE HCL 10 MG PO TABS: 10.0000 mg | ORAL_TABLET | Freq: Four times a day (QID) | ORAL | 0 refills | Status: DC

## 2022-03-24 NOTE — ED Notes (Signed)
RN reviewed discharge instructions w/ pt. Follow up and prescriptions reviewed, pt had no further questions °

## 2022-03-24 NOTE — Discharge Instructions (Addendum)
I suspect that you were seen at [facility] gastroenterology given that they are on [facility].  I have given you their information as well as [facility] gastroenterology.  He may call both and see who can see you more expediently.  I have written you prescription for Reglan which will help with nausea, Bentyl which I suspect will help with your abdominal pain it is not a narcotic medicine, and I have written you a prescription for omeprazole which I would like you to take daily.  Return to the emergency room for any new or concerning symptoms.

## 2022-03-24 NOTE — Discharge Instructions (Addendum)
He is go to the emergency department to be evaluated for right upper quadrant abdominal pain.

## 2022-03-24 NOTE — ED Triage Notes (Signed)
Pt c/o constant RUQ pain since Monday with no appetite. States pain is now radiating through to back. Taking tums with no relief. States hx of pancreatitis with same pain but is not drinking anymore.

## 2022-03-24 NOTE — ED Provider Notes (Signed)
[facility]    CSN: [id number] Arrival date & time: 03/24/22  0820      History   Chief Complaint Chief Complaint  Patient presents with   Abdominal Pain    HPI Gail Blackburn is a 46 y.o. female comes to the urgent care with right upper quadrant pain of 1 week duration.  Pain is dull, constant and confined to the right upper quadrant area.  Pain is currently of moderate severity with no known aggravating or relieving factors.  She denies any nausea, vomiting or diarrhea.  She is lost 4 pounds over the past week.  No yellowing of his eyes.  No changes in bowel movement.  Patient denies any fever or chills.  Patient has a history of alcohol induced pancreatitis.  She denies any alcohol use in the past year.  HPI  Past Medical History:  Diagnosis Date   Anemia    PRIOR HISTORY   Anxiety    Asthma    Bipolar disorder (HCC)    Borderline   Depression    Fibromyalgia    Headache    MIGRAINES   Hypothyroidism 2009   TOOK MEDS FOR FEW WEEKS NO MEDS NOW   Migraine    Thyroid disease    treated in past not sure if too high or too low    Patient Active Problem List   Diagnosis Date Noted   Neck pain 04/26/2021   Splenic vein thrombosis    Alcohol use disorder, moderate, dependence (HCC)    Constipation    Alcohol-induced acute pancreatitis    Alcohol withdrawal syndrome without complication (HCC)    Hyponatremia    Pain of upper abdomen 10/28/2020   Tinea manuum 05/21/2019   Abdominal wall lump 02/21/2018   Chronic fatigue syndrome with fibromyalgia 02/24/2017   Skin lesion of left leg 02/10/2017   History of abnormal cervical Pap smear 02/10/2017   Anxiety 10/15/2014   Depression 10/15/2014   Migraine 10/15/2014   Chronic pain 10/15/2014   Low TSH level 10/15/2014   Tobacco use disorder 10/15/2014    Past Surgical History:  Procedure Laterality Date   COLONOSCOPY     DILATION AND EVACUATION N/A 05/06/2016   Procedure: DILATATION AND EVACUATION;   Surgeon: Huel Cote, MD;  Location: [facility];  Service: Gynecology;  Laterality: N/A;   FOOT SURGERY     TEETH PULLED  03/2016   WISDOM TOOTH EXTRACTION      OB History   No obstetric history on file.      Home Medications    Prior to Admission medications   Medication Sig Start Date End Date Taking? Authorizing Provider  ALPRAZolam Prudy Feeler) 0.5 MG tablet Take 0.5 mg by mouth 2 (two) times daily as needed. 10/03/21   [provider]  botulinum toxin Type A (BOTOX) 100 units SOLR injection PROVIDER TO INJECT 155 UNITS INTRAMUSCULARLY INTO HEAD AND NECK EVERY 12 WEEKS. DISCARD REMAINDER. 01/06/22   Anson Fret, MD  buPROPion [facility] SR) 150 MG 12 hr tablet Take 150 mg by mouth in the morning.    [provider]  cyclobenzaprine (FLEXERIL) 10 MG tablet Take 1 tablet (10 mg total) by mouth 3 (three) times daily as needed for muscle spasms. Do not take with xanax or gabapentin to avoid significant sedation. 01/26/22   Anson Fret, MD  diclofenac sodium (VOLTAREN) 1 % GEL Apply 4 g topically 4 (four) times daily. 06/21/18   Anson Fret, MD  Fremanezumab-vfrm (AJOVY) [phone]  MG/1.5ML SOAJ Inject 225 mg into the skin every 30 (thirty) days. 01/26/22   Anson Fret, MD  gabapentin (NEURONTIN) 800 MG tablet Take 800 mg by mouth 3 (three) times daily. 12/09/16   [provider]  hydrOXYzine (VISTARIL) 50 MG capsule Take 25 mg by mouth 2 (two) times daily as needed for anxiety.    [provider]  ibuprofen (ADVIL) 200 MG tablet Take 600 mg by mouth every 6 (six) hours as needed (for aches, migraines, or headaches).    [provider]  JENCYCLA 0.35 MG tablet Take 1 tablet by mouth daily.    [provider]  methylPREDNISolone (MEDROL DOSEPAK) 4 MG TBPK tablet Take pills all together daily with food for 6 days. Take first dose as soon as possible. Take other doses in the morning. 6-5-4-3-2-1. 01/26/22   Anson Fret, MD  ondansetron  (ZOFRAN) 4 MG tablet TAKE 1 TABLET BY MOUTH EVERY 6 HOURS AS NEEDED FOR NAUSEA FOR VOMITING 08/04/21   Lomax, Amy, NP  PROAIR HFA 108 (90 Base) MCG/ACT inhaler Inhale 2 puffs into the lungs every 6 (six) hours as needed for wheezing or shortness of breath.    [provider]  propranolol ER (INDERAL LA) 60 MG 24 hr capsule Take 1 capsule (60 mg total) by mouth daily. 03/21/22   Lomax, Amy, NP  rizatriptan (MAXALT-MLT) 10 MG disintegrating tablet 1 TABLET FOR MIGRAINE. MAY REPEAT IN 2 HOURS. 03/21/22   Lomax, Amy, NP  SUBOXONE 8-2 MG FILM Place 1 Film under the tongue in the morning and at bedtime.    [provider]  valACYclovir (VALTREX) 1000 MG tablet Take 1,000 mg by mouth daily as needed (as directed for flares).    [provider]  venlafaxine XR (EFFEXOR-XR) 75 MG 24 hr capsule Take 1 capsule (75 mg total) by mouth daily with breakfast. Patient taking differently: Take 150 mg by mouth daily with breakfast. 11/10/20   Mirian Mo, MD    Family History Family History  Problem Relation Age of Onset   Bipolar disorder Mother    Alcohol abuse Mother    Hyperlipidemia Mother    Heart disease Maternal Grandmother    Stroke Maternal Grandmother    Leukemia Maternal Grandmother    Hyperlipidemia Maternal Grandmother    Hypertension Maternal Grandmother    Graves' disease Cousin    Alcohol abuse Maternal Aunt    Drug abuse Maternal Aunt    Asthma Maternal Aunt    COPD Maternal Aunt    Bipolar disorder Maternal Aunt    Hyperlipidemia Maternal Aunt     Social History Social History   Tobacco Use   Smoking status: Every Day    Years: 15.00    Types: Cigarettes   Smokeless tobacco: Never   Tobacco comments:    Up to 5 vape cigs per day  Vaping Use   Vaping Use: Every day  Substance Use Topics   Alcohol use: Not Currently    Comment: Occasional, maybe 2 x/yr   Drug use: Not Currently    Comment: Previous addiction to pain meds     Allergies   Latex  and Tape   Review of Systems Review of Systems  Constitutional:  Positive for unexpected weight change.  HENT: Negative.    Respiratory: Negative.    Gastrointestinal:  Positive for abdominal pain.  Musculoskeletal: Negative.   Skin: Negative.   Neurological: Negative.      Physical Exam Triage Vital Signs ED Triage  Vitals  Enc Vitals Group     BP 03/24/22 0834 117/82     Pulse Rate 03/24/22 0834 90     Resp 03/24/22 0834 18     Temp 03/24/22 0834 98.5 F (36.9 C)     Temp Source 03/24/22 0834 Oral     SpO2 03/24/22 0834 98 %     Weight --      Height --      Head Circumference --      Peak Flow --      Pain Score 03/24/22 0835 8     Pain Loc --      Pain Edu? --      Excl. in GC? --    No data found.  Updated Vital Signs BP 117/82 (BP Location: Left Arm)   Pulse 90   Temp 98.5 F (36.9 C) (Oral)   Resp 18   LMP 03/10/2022   SpO2 98%   Visual Acuity Right Eye Distance:   Left Eye Distance:   Bilateral Distance:    Right Eye Near:   Left Eye Near:    Bilateral Near:     Physical Exam Vitals and nursing note reviewed.  Constitutional:      General: She is not in acute distress.    Appearance: She is not ill-appearing.  Cardiovascular:     Rate and Rhythm: Normal rate and regular rhythm.  Pulmonary:     Effort: Pulmonary effort is normal.     Breath sounds: Normal breath sounds.  Abdominal:     General: Bowel sounds are normal.     Palpations: Abdomen is soft. There is no hepatomegaly or splenomegaly.     Tenderness: There is abdominal tenderness in the right upper quadrant. There is no guarding or rebound.     Hernia: No hernia is present.  Neurological:     Mental Status: She is alert.      UC Treatments / Results  Labs (all labs ordered are listed, but only abnormal results are displayed) Labs Reviewed - No data to display  EKG   Radiology No results found.  Procedures Procedures (including critical care time)  Medications  Ordered in UC Medications - No data to display  Initial Impression / Assessment and Plan / UC Course  I have reviewed the triage vital signs and the nursing notes.  Pertinent labs & imaging results that were available during my care of the patient were reviewed by me and considered in my medical decision making (see chart for details).     1.  Right upper quadrant abdominal pain: Patient is advised to go to the emergency room for further evaluation.  I think patient will require advanced imaging to evaluate her liver and gallbladder.  She also needs some work-up done in the emergency room.  Patient is agreeable with plan of care. Final Clinical Impressions(s) / UC Diagnoses   Final diagnoses:  Continuous RUQ abdominal pain     Discharge Instructions      He is go to the emergency department to be evaluated for right upper quadrant abdominal pain.   ED Prescriptions   None    PDMP not reviewed this encounter.   Merrilee Jansky, MD 03/24/22 [phone]

## 2022-03-24 NOTE — ED Triage Notes (Signed)
Pt c/o RUQ pain that radiates to her back. HX of pancreatitis. Decreased appetite. Pt does reports nausea without vomiting.  Denies dysuria.

## 2022-03-24 NOTE — ED Provider Notes (Signed)
[facility] EMERGENCY DEPARTMENT Provider Note   CSN: [id number] Arrival date & time: 03/24/22  5916     History  Chief Complaint  Patient presents with   Abdominal Pain    Gail Blackburn is a 46 y.o. female.   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  Patient is presented to the emergency room today with right upper quadrant abdominal pain that is dull achy constant and located in the right upper abdomen.  She states that she has not had any vomiting or diarrhea but does endorse some occasional nausea which is mild worse with eating or drinking water.  She states that she has a history of pancreatitis 1 year ago but has not drank alcohol since that time.  She states that her pain is worse with certain movements does not seem to be aggravated or alleviated by any other factors.  She does not take any medications prior to arrival.     Home Medications Prior to Admission medications   Medication Sig Start Date End Date Taking? Authorizing Provider  dicyclomine (BENTYL) 20 MG tablet Take 1 tablet (20 mg total) by mouth 2 (two) times daily. 03/24/22  Yes Abriel Geesey S, PA  metoCLOPramide (REGLAN) 10 MG tablet Take 1 tablet (10 mg total) by mouth every 6 (six) hours. 03/24/22  Yes Kerman Pfost S, PA  omeprazole (PRILOSEC) 20 MG capsule Take 1 capsule (20 mg total) by mouth daily. 03/24/22  Yes Lynx Goodrich, Rodrigo Ran, PA  ALPRAZolam (XANAX) 0.5 MG tablet Take 0.5 mg by mouth 2 (two) times daily as needed. 10/03/21   [provider]  botulinum toxin Type A (BOTOX) 100 units SOLR injection PROVIDER TO INJECT 155 UNITS INTRAMUSCULARLY INTO HEAD AND NECK EVERY 12 WEEKS. DISCARD REMAINDER. 01/06/22   Anson Fret, MD  buPROPion [facility] SR) 150 MG 12 hr tablet Take 150 mg by mouth in the morning.    [provider]  cyclobenzaprine (FLEXERIL) 10 MG tablet Take 1 tablet (10 mg total) by mouth 3 (three) times daily as needed for muscle spasms. Do not take with xanax or gabapentin to avoid significant sedation. 01/26/22   Anson Fret, MD  diclofenac sodium (VOLTAREN) 1 % GEL Apply 4 g topically 4 (four) times daily. 06/21/18   Anson Fret, MD  Fremanezumab-vfrm (AJOVY) 225 MG/1.5ML SOAJ Inject 225 mg into the skin every 30 (thirty) days. 01/26/22   Anson Fret, MD  gabapentin (NEURONTIN) 800 MG tablet Take 800 mg by mouth 3 (three) times daily. 12/09/16   [provider]  hydrOXYzine (VISTARIL) 50 MG capsule Take 25 mg by mouth 2 (two) times daily as needed for anxiety.    [provider]  ibuprofen (ADVIL) 200 MG tablet Take 600 mg by mouth every 6 (six) hours as needed (for aches, migraines, or headaches).    [provider]  JENCYCLA 0.35 MG tablet Take 1 tablet by mouth daily.    [provider]  methylPREDNISolone (MEDROL DOSEPAK) 4 MG TBPK tablet Take pills all together daily with food for 6 days. Take first dose as soon as possible. Take other doses in the morning. 6-5-4-3-2-1. 01/26/22   Anson Fret, MD  ondansetron (ZOFRAN) 4 MG tablet TAKE 1 TABLET BY MOUTH EVERY 6 HOURS AS NEEDED FOR NAUSEA FOR VOMITING 08/04/21   Lomax, Amy, NP  PROAIR HFA 108 (90  Base) MCG/ACT inhaler Inhale 2 puffs into the lungs every 6 (six) hours as needed for wheezing or shortness of breath.    [provider]  propranolol ER (INDERAL LA) 60 MG 24 hr capsule Take 1 capsule (60 mg total) by mouth daily. 03/21/22   Lomax, Amy, NP  rizatriptan (MAXALT-MLT) 10 MG disintegrating tablet 1 TABLET FOR MIGRAINE. MAY REPEAT IN 2 HOURS. 03/21/22   Lomax, Amy, NP  SUBOXONE 8-2 MG FILM Place 1 Film under the tongue in the morning and at bedtime.    [provider]  valACYclovir (VALTREX) 1000 MG tablet Take 1,000 mg by mouth daily as needed (as directed for flares).     [provider]  venlafaxine XR (EFFEXOR-XR) 75 MG 24 hr capsule Take 1 capsule (75 mg total) by mouth daily with breakfast. Patient taking differently: Take 150 mg by mouth daily with breakfast. 11/10/20   Mirian Mo, MD      Allergies    Latex and Tape    Review of Systems   Review of Systems  Gastrointestinal:  Positive for abdominal pain.    Physical Exam Updated Vital Signs BP (!) 96/55   Pulse 72   Temp 98.5 F (36.9 C) (Oral)   Resp 18   Ht 5\' 5"  (1.651 m)   Wt 55.3 kg   LMP 03/10/2022   SpO2 100%   BMI 20.30 kg/m  Physical Exam Vitals and nursing note reviewed.  Constitutional:      General: She is not in acute distress.    Comments: Pleasant 46 year old female uncomfortable but in no acute distress.  HENT:     Head: Normocephalic and atraumatic.     Nose: Nose normal.  Eyes:     General: No scleral icterus. Cardiovascular:     Rate and Rhythm: Normal rate and regular rhythm.     Pulses: Normal pulses.     Heart sounds: Normal heart sounds.  Pulmonary:     Effort: Pulmonary effort is normal. No respiratory distress.     Breath sounds: No wheezing.  Abdominal:     Palpations: Abdomen is soft.     Tenderness: There is abdominal tenderness in the right upper quadrant.  Musculoskeletal:     Cervical back: Normal range of motion.     Right lower leg: No edema.     Left lower leg: No edema.  Skin:    General: Skin is warm and dry.     Capillary Refill: Capillary refill takes less than 2 seconds.  Neurological:     Mental Status: She is alert. Mental status is at baseline.  Psychiatric:        Mood and Affect: Mood normal.        Behavior: Behavior normal.     ED Results / Procedures / Treatments   Labs (all labs ordered are listed, but only abnormal results are displayed) Labs Reviewed  COMPREHENSIVE METABOLIC PANEL - Abnormal; Notable for the following components:      Result Value   Glucose, Bld 101 (*)    AST 14 (*)    All other  components within normal limits  URINALYSIS, ROUTINE W REFLEX MICROSCOPIC - Abnormal; Notable for the following components:   APPearance CLOUDY (*)    Ketones, ur 20 (*)    All other components within normal limits  LIPASE, BLOOD  CBC  I-STAT BETA HCG BLOOD, ED ([facility], [facility], [facility] ONLY)    EKG None  Radiology 55 Abdomen Limited RUQ (LIVER/GB)  Result Date: 03/24/2022 CLINICAL DATA:  Right upper quadrant pain EXAM: ULTRASOUND ABDOMEN LIMITED RIGHT UPPER QUADRANT COMPARISON:  CT abdomen 12/03/2020 FINDINGS: Gallbladder: No gallstones or wall thickening visualized. No sonographic Murphy sign noted by sonographer. Common bile duct: Diameter: 4.4 mm Liver: No focal lesion identified. Within normal limits in parenchymal echogenicity. Portal vein is patent on color Doppler imaging with normal direction of blood flow towards the liver. Other: None. IMPRESSION: Negative right upper quadrant ultrasound. Electronically Signed   By: Marlan Palau M.D.   On: 03/24/2022 12:35    Procedures Procedures    Medications Ordered in ED Medications  lactated ringers bolus 1,000 mL (0 mLs Intravenous Stopped 03/24/22 1252)  acetaminophen (TYLENOL) tablet 1,000 mg (1,000 mg Oral Given 03/24/22 1054)  metoCLOPramide (REGLAN) injection 10 mg (10 mg Intravenous Given 03/24/22 1054)  diphenhydrAMINE (BENADRYL) capsule 25 mg (25 mg Oral Given 03/24/22 1054)  pantoprazole (PROTONIX) injection 40 mg (40 mg Intravenous Given 03/24/22 1054)    ED Course/ Medical Decision Making/ A&P                           Medical Decision Making Amount and/or Complexity of Data Reviewed Labs: ordered. Radiology: ordered.  Risk OTC drugs. Prescription drug management.   This patient presents to the ED for concern of abd pain, this involves a number of treatment options, and is a complaint that carries with it a moderate/high risk of complications and morbidity.  The causes of generalized abdominal pain include but are not limited  to AAA, mesenteric ischemia, appendicitis, diverticulitis, DKA, gastritis, gastroenteritis, AMI, nephrolithiasis, pancreatitis, peritonitis, adrenal insufficiency,lead poisoning, iron toxicity, intestinal ischemia, constipation, UTI,SBO/LBO, splenic rupture, biliary disease, IBD, IBS, PUD, or hepatitis. Ectopic pregnancy, ovarian torsion, PID.   Pain seems to be primarily in the right upper quadrant making pancreatitis, cholecystitis, choledocholithiasis peptic ulcer disease alcoholic gastritis all somewhat more likely.  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  Patient is presented to the emergency room today with right upper quadrant abdominal pain that is dull achy constant and located in the right upper abdomen.  She states that she has not had any vomiting or diarrhea but does endorse some occasional nausea which is mild worse with eating or drinking water.  She states that she has a history of pancreatitis 1 year ago but has not drank alcohol since that time.  She states that her pain is worse with certain movements does not seem to be aggravated or alleviated by any other factors.  She does not take any medications prior to arrival.    EMR reviewed including pt PMHx, past surgical history and past visits to ER.   See HPI for more details   Lab Tests:   I ordered and independently interpreted labs. Labs notable for CMP without abnormality, CBC for leukocytosis or anemia lipase within normal limits urinalysis unremarkable apart from a few ketones acid Hg negative for pregnancy.   Imaging Studies:  NAD. I personally reviewed all imaging studies and no acute abnormality found. I agree with radiology interpretation.   I personally viewed images and agree with radiology read no acute  abnormalities ultrasound of right upper quadrant IMPRESSION:  Negative right upper quadrant ultrasound.      Electronically Signed    By: Marlan Palau M.D.  On: 03/24/2022 12:35     Cardiac Monitoring:  The patient was maintained on a cardiac monitor.  I personally viewed and interpreted the cardiac monitored which showed an underlying rhythm of: NSR    Medicines ordered:  I ordered medication including Tylenol, Benadryl, Reglan, Protonix, 1 L LR for nausea, pain Reevaluation of the patient after these medicines showed that the patient improved I have reviewed the patients home medicines and have made adjustments as needed   Critical Interventions:     Consults/Attending Physician      Reevaluation:  After the interventions noted above I re-evaluated patient and found that they have :improved   Social Determinants of Health:      Problem List / ED Course:  Abdominal pain-negative right upper quadrant ultrasound, feels much improved after medications here in the ER.  Return precautions discussed.  We will follow-up with PCP.  I am considering gastritis, peptic ulcer disease, or hyperperistalsis/abdominal/intestinal cramping   Dispostion:  After consideration of the diagnostic results and the patients response to treatment, I feel that the patent would benefit from close outpatient follow-up.   Provided information for gastroenterology, provided Bentyl Reglan omeprazole and very strict return precautions were given.   Final Clinical Impression(s) / ED Diagnoses Final diagnoses:  Right upper quadrant abdominal pain    Rx / DC Orders ED Discharge Orders          Ordered    dicyclomine (BENTYL) 20 MG tablet  2 times daily        03/24/22 1304    metoCLOPramide (REGLAN) 10 MG tablet  Every 6 hours        03/24/22 1304    omeprazole (PRILOSEC) 20 MG capsule  Daily        03/24/22 1304              Solon Augusta [place], [place] 03/24/22 1305     Maia Plan, MD 03/25/22 0725

## 2022-03-25 ENCOUNTER — Telehealth: Payer: Self-pay | Admitting: *Deleted

## 2022-03-25 NOTE — Telephone Encounter (Signed)
Pt called regarding changes in Rx. RNCM explained that changes made were for her nausea.  Pt appreciative of education provided and verbalized understanding through teach back.

## 2022-03-25 NOTE — Telephone Encounter (Signed)
Pharmacy called related to Rx: reglan interaction with rexulti  .Marland KitchenMarland KitchenEDCM clarified with EDP ([place]) to change Rx to: zofran.

## 2022-04-04 DIAGNOSIS — M47812 Spondylosis without myelopathy or radiculopathy, cervical region: Secondary | ICD-10-CM | POA: Diagnosis not present

## 2022-04-14 NOTE — Telephone Encounter (Signed)
Received (2) 100 unit vials of Botox from [facility]. 

## 2022-04-21 ENCOUNTER — Ambulatory Visit: Payer: Medicaid Other | Admitting: Physician Assistant

## 2022-04-26 ENCOUNTER — Ambulatory Visit (INDEPENDENT_AMBULATORY_CARE_PROVIDER_SITE_OTHER): Payer: Medicaid Other | Admitting: Neurology

## 2022-04-26 ENCOUNTER — Telehealth: Payer: Self-pay | Admitting: *Deleted

## 2022-04-26 DIAGNOSIS — G43711 Chronic migraine without aura, intractable, with status migrainosus: Secondary | ICD-10-CM | POA: Diagnosis not present

## 2022-04-26 DIAGNOSIS — G43009 Migraine without aura, not intractable, without status migrainosus: Secondary | ICD-10-CM

## 2022-04-26 DIAGNOSIS — Z79899 Other long term (current) drug therapy: Secondary | ICD-10-CM | POA: Diagnosis not present

## 2022-04-26 MED ORDER — NURTEC 75 MG PO TBDP: 75.0000 mg | ORAL_TABLET | Freq: Every day | ORAL | 6 refills | Status: DC | PRN

## 2022-04-26 MED ORDER — ONABOTULINUMTOXINA 100 UNITS IJ SOLR
155.0000 [IU] | Freq: Once | INTRAMUSCULAR | Status: AC
  Administered 2022-04-26: 155 [IU] via INTRAMUSCULAR

## 2022-04-26 NOTE — Telephone Encounter (Signed)
Completed Nurtec PA on Cover My Meds. Key: [id number]. Awaiting determination from [employer].

## 2022-04-26 NOTE — Progress Notes (Signed)
04/26/2022: emgality did not help. Ajovy not approved. Botox working great >> 50% improvement in migraine frequency. Needs acute management. Failed imitrex and maxalt 9work ok) Ablation of occipital nerves helped a lot. Now having 4 migraine days a month and < 10 total headache days a month needs a good acute management medication. Will try nurtec  Meds ordered this encounter  Medications   botulinum toxin Type A (BOTOX) injection 155 Units    Botox- 100 units x 2 vials Lot: [id number] Expiration: 07/2024 NDC: 4765-4650-35  Bacteriostatic 0.9% Sodium Chloride- 62mL total Lot: [id number] Expiration: 04/27/2023 NDC: 2751-7001-74  Dx: G43.711 S/P   Rimegepant Sulfate (NURTEC) 75 MG TBDP    Sig: Take 75 mg by mouth daily as needed. For migraines. Take as close to onset of migraine as possible. One daily maximum.    Dispense:  10 tablet    Refill:  6    Already tried imitrex and maxalt. Has 4 migraines a month and < 10 today headache days a month.     01/26/2022: Botox gives > 50% relief but still having 8 migraine days a month and > 15 headache days a month will try Ajovy, emgality not helping, aimovig contraindicated due to constipation.     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 >50% improvement, she has a lot of neck pain, getting injections at [facility]. Resend to [facility], she requested some dry needling, loved Shanda Bumps.    Consent Form Botulism Toxin Injection For Chronic Migraine    Reviewed orally with patient, additionally signature is on file:  Botulism toxin has been approved by the Federal drug administration for treatment of chronic migraine. Botulism toxin does not cure chronic migraine and it may not be effective in some patients.  The administration of botulism toxin is accomplished by injecting a small amount of toxin into the muscles of the neck and head. Dosage must be titrated for each individual. Any benefits resulting from  botulism toxin tend to wear off after 3 months with a repeat injection required if benefit is to be maintained. Injections are usually done every 3-4 months with maximum effect peak achieved by about 2 or 3 weeks. Botulism toxin is expensive and you should be sure of what costs you will incur resulting from the injection.  The side effects of botulism toxin use for chronic migraine may include:   -Transient, and usually mild, facial weakness with facial injections  -Transient, and usually mild, head or neck weakness with head/neck injections  -Reduction or loss of forehead facial animation due to forehead muscle weakness  -Eyelid drooping  -Dry eye  -Pain at the site of injection or bruising at the site of injection  -Double vision  -Potential unknown long term risks   Contraindications: You should not have Botox if you are pregnant, nursing, allergic to albumin, have an infection, skin condition, or muscle weakness at the site of the injection, or have myasthenia gravis, Lambert-Eaton syndrome, or ALS.  It is also possible that as with any injection, there may be an allergic reaction or no effect from the medication. Reduced effectiveness after repeated injections is sometimes seen and rarely infection at the injection site may occur. All care will be taken to prevent these side effects. If therapy is given over a long time, atrophy and wasting in the muscle injected may occur. Occasionally the patient's become refractory to treatment because they develop antibodies to the toxin. In this event, therapy needs to be modified.  I have read the above information and consent to the administration of botulism toxin.    BOTOX PROCEDURE NOTE FOR MIGRAINE HEADACHE  Contraindications and precautions discussed with patient(above). Aseptic rocedure was observed and patient tolerated procedure.   The condition has existed for more than 6 months, and pt does not have a diagnosis of ALS, Myasthenia  Gravis or Lambert-Eaton Syndrome.  Risks and benefits of injections discussed and pt agrees to proceed with the procedure.  Written consent obtained  These injections are medically necessary. Pt  receives good benefits from these injections. These injections do not cause sedations or hallucinations which the oral therapies may cause.   Description of procedure:  The patient was placed in a sitting position. The standard protocol was used for Botox as follows, with 5 units of Botox injected at each site:  -Procerus muscle, midline injection  -Corrugator muscle, bilateral injection  -Frontalis muscle, bilateral injection, with 2 sites each side, medial injection was performed in the upper one third of the frontalis muscle, in the region vertical from the medial inferior edge of the superior orbital rim. The lateral injection was again in the upper one third of the forehead vertically above the lateral limbus of the cornea, 1.5 cm lateral to the medial injection site.  -Temporalis muscle injection, 4 sites, bilaterally. The first injection was 3 cm above the tragus of the ear, second injection site was 1.5 cm to 3 cm up from the first injection site in line with the tragus of the ear. The third injection site was 1.5-3 cm forward between the first 2 injection sites. The fourth injection site was 1.5 cm posterior to the second injection site. 5th site laterally in the temporalis  muscleat the level of the outer canthus.  -Occipitalis muscle injection, 3 sites, bilaterally. The first injection was done one half way between the occipital protuberance and the tip of the mastoid process behind the ear. The second injection site was done lateral and superior to the first, 1 fingerbreadth from the first injection. The third injection site was 1 fingerbreadth superiorly and medially from the first injection site.  -Cervical paraspinal muscle injection, 2 sites, bilaterally. The first injection site was 1 cm  from the midline of the cervical spine, 3 cm inferior to the lower border of the occipital protuberance. The second injection site was 1.5 cm superiorly and laterally to the first injection site.  -Trapezius muscle injection was performed at 3 sites, bilaterally. The first injection site was in the upper trapezius muscle halfway between the inflection point of the neck, and the acromion. The second injection site was one half way between the acromion and the first injection site. The third injection was done between the first injection site and the inflection point of the neck.  Will return for repeat injection in 3 months.   A total of 155 units of Botox was prepared, 45 units of Botox was injected as documented above, any Botox not injected was wasted. The patient tolerated the procedure well, there were no complications of the above procedure.

## 2022-04-26 NOTE — Patient Instructions (Signed)
Nurtec:  Prevention: take it every other day Acute/emergent: right at onset of migraine with the rizatriptan  Some people actually take it M/W/F then use the excess as needed.   If not nurtec get ubrelvy  Rimegepant Disintegrating Tablets What is this medication? RIMEGEPANT (ri ME je pant) prevents and treats migraines. It works by blocking a substance in the body that causes migraines. This medicine may be used for other purposes; ask your health care provider or pharmacist if you have questions. COMMON BRAND NAME(S): NURTEC ODT What should I tell my care team before I take this medication? They need to know if you have any of these conditions: Kidney disease Liver disease An unusual or allergic reaction to rimegepant, other medications, foods, dyes, or preservatives Pregnant or trying to get pregnant Breast-feeding How should I use this medication? Take this medication by mouth. Take it as directed on the prescription label. Leave the tablet in the sealed pack until you are ready to take it. With dry hands, open the pack and gently remove the tablet. If the tablet breaks or crumbles, throw it away. Use a new tablet. Place the tablet in the mouth and allow it to dissolve. Then, swallow it. Do not cut, crush, or chew this medication. You do not need water to take this medication. Talk to your care team about the use of this medication in children. Special care may be needed. Overdosage: If you think you have taken too much of this medicine contact a poison control center or emergency room at once. NOTE: This medicine is only for you. Do not share this medicine with others. What if I miss a dose? This does not apply. This medication is not for regular use. What may interact with this medication? Certain medications for fungal infections, such as fluconazole, itraconazole Rifampin This list may not describe all possible interactions. Give your health care provider a list of all the  medicines, herbs, non-prescription drugs, or dietary supplements you use. Also tell them if you smoke, drink alcohol, or use illegal drugs. Some items may interact with your medicine. What should I watch for while using this medication? Visit your care team for regular checks on your progress. Tell your care team if your symptoms do not start to get better or if they get worse. What side effects may I notice from receiving this medication? Side effects that you should report to your care team as soon as possible: Allergic reactions--skin rash, itching, hives, swelling of the face, lips, tongue, or throat Side effects that usually do not require medical attention (report to your care team if they continue or are bothersome): Nausea Stomach pain This list may not describe all possible side effects. Call your doctor for medical advice about side effects. You may report side effects to FDA at 1-800-FDA-1088. Where should I keep my medication? Keep out of the reach of children and pets. Store at room temperature between 20 and 25 degrees C (68 and 77 degrees F). Get rid of any unused medication after the expiration date. To get rid of medications that are no longer needed or have expired: Take the medication to a medication take-back program. Check with your pharmacy or law enforcement to find a location. If you cannot return the medication, check the label or package insert to see if the medication should be thrown out in the garbage or flushed down the toilet. If you are not sure, ask your care team. If it is safe to put it  in the trash, take the medication out of the container. Mix the medication with cat litter, dirt, coffee grounds, or other unwanted substance. Seal the mixture in a bag or container. Put it in the trash. NOTE: This sheet is a summary. It may not cover all possible information. If you have questions about this medicine, talk to your doctor, pharmacist, or health care provider.  2023  Elsevier/Gold Standard (2021-11-03 00:00:00)

## 2022-04-26 NOTE — Progress Notes (Signed)
Botox consent signed  Botox- 100 units x 2 vials  Lot: [id number]  Expiration: 07/2024  NDC: 1027-2536-64   Bacteriostatic 0.9% Sodium Chloride- 83mL total  Lot: [id number]  Expiration: 04/27/2023  NDC: 2595-6387-56   Dx: E33.295  S/P

## 2022-05-26 ENCOUNTER — Telehealth: Payer: Self-pay | Admitting: Family Medicine

## 2022-05-26 NOTE — Telephone Encounter (Signed)
..   Medicaid Managed Care   Unsuccessful Outreach Note  05/26/2022 Name: Gail Blackburn MRN: [id number] DOB: 1976-08-31  Referred by: Sabino Dick, DO Reason for referral : High Risk Managed Medicaid (I called the patient today to get her scheduled with the MM Team. I did not leave a message since the name on the VM did not match.)   A second unsuccessful telephone outreach was attempted today. The patient was referred to the case management team for assistance with care management and care coordination.   Follow Up Plan: The care management team will reach out to the patient again over the next 14 days.    Weston Settle Care Guide, High Risk Medicaid Managed Care Embedded Care Coordination [facility]  [facility]    SIGNATURE

## 2022-06-01 ENCOUNTER — Telehealth: Payer: Self-pay | Admitting: Family Medicine

## 2022-06-01 NOTE — Telephone Encounter (Signed)
..   Medicaid Managed Care   Unsuccessful Outreach Note  06/01/2022 Name: Marisella Puccio MRN: [id number] DOB: [birth year]  Referred by: Sabino Dick, DO Reason for referral : High Risk Managed Medicaid (I called the patient today to get her scheduled with the MM Team. She did not answer. I left my name and number on her VM.)   Third unsuccessful telephone outreach was attempted today. The patient was referred to the case management team for assistance with care management and care coordination. The patient's primary care provider has been notified of our unsuccessful attempts to make or maintain contact with the patient. The care management team is pleased to engage with this patient at any time in the future should he/she be interested in assistance from the care management team.   Follow Up Plan: We have been unable to make contact with the patient for follow up. The care management team is available to follow up with the patient after provider conversation with the patient regarding recommendation for care management engagement and subsequent re-referral to the care management team.   Weston Settle Care Guide, High Risk Medicaid Managed Care Embedded Care Coordination [facility]  [facility]    SIGNATURE

## 2022-07-06 ENCOUNTER — Telehealth: Payer: Self-pay

## 2022-07-06 NOTE — Telephone Encounter (Signed)
I called [facility].  I spoke with [place].  I scheduled Botox delivery for July 12, 2022.

## 2022-07-14 ENCOUNTER — Ambulatory Visit: Payer: Medicaid Other | Admitting: Neurology

## 2022-07-14 DIAGNOSIS — G43711 Chronic migraine without aura, intractable, with status migrainosus: Secondary | ICD-10-CM | POA: Diagnosis not present

## 2022-07-14 DIAGNOSIS — R519 Headache, unspecified: Secondary | ICD-10-CM

## 2022-07-14 MED ORDER — ONABOTULINUMTOXINA 100 UNITS IJ SOLR
155.0000 [IU] | Freq: Once | INTRAMUSCULAR | Status: AC
  Administered 2022-07-14: 155 [IU] via INTRAMUSCULAR

## 2022-07-14 NOTE — Patient Instructions (Signed)
Raynaud's Phenomenon  Raynaud's phenomenon is a condition that affects the blood vessels (arteries) that carry blood to the fingers and toes. The arteries that supply blood to the ears, lips, nipples, or the tip of the nose might also be affected. Raynaud's phenomenon causes the arteries to become narrow temporarily (spasm). As a result, the flow of blood to the affected areas is temporarily decreased. This usually occurs in response to cold temperatures or stress. During an attack, the skin in the affected areas turns white, then blue, and finally red. A person may also feel tingling or numbness in those areas. Attacks usually last for only a brief period, and then the blood flow to the area returns to normal. In most cases, Raynaud's phenomenon does not cause serious health problems. What are the causes? In many cases, the cause of this condition is not known. The condition may occur on its own (primary Raynaud's phenomenon) or may be associated with other diseases or factors (secondary Raynaud's phenomenon). Possible causes may include: Diseases or medical conditions that damage the arteries. Injuries and repetitive actions that hurt the hands or feet. Being exposed to certain chemicals. Taking medicines that narrow the arteries. Other medical conditions, such as lupus, scleroderma, rheumatoid arthritis, thyroid problems, blood disorders, Sjogren syndrome, or atherosclerosis. What increases the risk? The following factors may make you more likely to develop this condition: Being 20-40 years old. Being female. Having a family history of Raynaud's phenomenon. Living in a cold climate. Smoking. What are the signs or symptoms? Symptoms of this condition usually occur when you are exposed to cold temperatures or when you have emotional stress. The symptoms may last for a few minutes or up to several hours. They usually affect your fingers but may also affect your toes, nipples, lips, ears, or the  tip of your nose. Symptoms may include: Changes in skin color. The skin in the affected areas will turn pale or white. The skin may then change from white to bluish to red as normal blood flow returns to the area. Numbness, tingling, or pain in the affected areas. In severe cases, symptoms may include: Skin sores. Tissues decaying and dying (gangrene). How is this diagnosed? This condition may be diagnosed based on: Your symptoms and medical history. A physical exam. During the exam, you may be asked to put your hands in cold water to check for a reaction to cold temperature. Tests, such as: Blood tests to check for other diseases or conditions. A test to check the movement of blood through your arteries and veins (vascular ultrasound). A test in which the skin at the base of your fingernail is examined under a microscope (nailfold capillaroscopy). How is this treated? During an episode, you can take actions to help symptoms go away faster. Options include moving your arms around in a windmill pattern, warming your fingers under warm water, or placing your fingers in a warm body fold, such as your armpit. Long-term treatment for this condition often involves making lifestyle changes and taking steps to control your exposure to cold temperature. For more severe cases, medicine (calcium channel blockers) may be used to improve blood circulation. Follow these instructions at home: Avoiding cold temperatures Take these steps to avoid exposure to cold: If possible, stay indoors during cold weather. When you go outside during cold weather, dress in layers and wear mittens, a hat, a scarf, and warm footwear. Wear mittens or gloves when handling ice or frozen food. Use holders for glasses or cans containing   cold drinks. Let warm water run for a while before taking a shower or bath. Warm up the car before driving in cold weather. Lifestyle If possible, avoid stressful and emotional situations. Try  to find ways to manage your stress, such as: Exercise. Yoga. Meditation. Biofeedback. Do not use any products that contain nicotine or tobacco. These products include cigarettes, chewing tobacco, and vaping devices, such as e-cigarettes. If you need help quitting, ask your health care provider. Avoid secondhand smoke. Limit your use of caffeine. Switch to decaffeinated coffee, tea, and soda. Avoid chocolate. Avoid vibrating tools and machinery. General instructions Protect your hands and feet from injuries, cuts, or bruises. Avoid wearing tight rings or wristbands. Wear loose fitting socks and comfortable, roomy shoes. Take over-the-counter and prescription medicines only as told by your health care provider. Where to find support Raynaud's Association: www.raynauds.org Where to find more information National Institute of Arthritis and Musculoskeletal and Skin Diseases: www.niams.nih.gov Contact a health care provider if: Your discomfort becomes worse despite lifestyle changes. You develop sores on your fingers or toes that do not heal. You have breaks in the skin on your fingers or toes. You have a fever. You have pain or swelling in your joints. You have a rash. Your symptoms occur on only one side of your body. Get help right away if: Your fingers or toes turn black. You have severe pain in the affected areas. These symptoms may represent a serious problem that is an emergency. Do not wait to see if the symptoms will go away. Get medical help right away. Call your local emergency services (911 in the [place] Do not drive yourself to the hospital. Summary Raynaud's phenomenon is a condition that affects the arteries that carry blood to the fingers, toes, ears, lips, nipples, or the tip of the nose. In many cases, the cause of this condition is not known. Symptoms of this condition include changes in skin color along with numbness and tingling in the affected area. Treatment for  this condition includes lifestyle changes and reducing exposure to cold temperatures. Medicines may be used for severe cases of the condition. Contact your health care provider if your condition worsens despite treatment. This information is not intended to replace advice given to you by your health care provider. Make sure you discuss any questions you have with your health care provider. Document Revised: 11/17/2020 Document Reviewed: 11/17/2020 Elsevier Patient Education  2023 Elsevier Inc.  

## 2022-07-14 NOTE — Progress Notes (Signed)
Botox- 100 units x 2 vials Lot: [id number] Expiration: 07/2024 NDC: 0023-1145-01  Bacteriostatic 0.9% Sodium Chloride- 4mL total Lot: [id number] Expiration: 04/27/2023 NDC: 0409-1966-02  Dx: G43.711 S/P  

## 2022-07-14 NOTE — Progress Notes (Signed)
07/14/2022: Doing well on botox for her migraines(>>50% relief of migraine severity, freq and duration with botox) but still with considerable pain in occipital headaches and nerve blocks are the only thing that helps her occipital headaches. [facility].  Orders Placed This Encounter  Procedures   Ambulatory referral to Neurology     04/26/2022: emgality did not help. Ajovy not approved. Botox working great >> 50% improvement in migraine frequency. Needs acute management. Failed imitrex and maxalt 9work ok) Ablation of occipital nerves helped a lot. Now having 4 migraine days a month and < 10 total headache days a month needs a good acute management medication. Will try nurtec  Meds ordered this encounter  Medications   botulinum toxin Type A (BOTOX) injection 155 Units    Botox- 100 units x 2 vials Lot: [id number] Expiration: 07/2024 NDC: 9563-8756-43   Bacteriostatic 0.9% Sodium Chloride- 33mL total Lot: [id number] Expiration: 04/27/2023 NDC: 8416-6063-01   Dx: S01.093 S/P     01/26/2022: Botox gives > 50% relief but still having 8 migraine days a month and > 15 headache days a month will try Ajovy, emgality not helping, aimovig contraindicated due to constipation.     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 >50% improvement, she has a lot of neck pain, getting injections at [facility]. Resend to [facility], she requested some dry needling, loved Shanda Bumps.    Consent Form Botulism Toxin Injection For Chronic Migraine    Reviewed orally with patient, additionally signature is on file:  Botulism toxin has been approved by the Federal drug administration for treatment of chronic migraine. Botulism toxin does not cure chronic migraine and it may not be effective in some patients.  The administration of botulism toxin is accomplished by injecting a small amount of toxin into the muscles of the  neck and head. Dosage must be titrated for each individual. Any benefits resulting from botulism toxin tend to wear off after 3 months with a repeat injection required if benefit is to be maintained. Injections are usually done every 3-4 months with maximum effect peak achieved by about 2 or 3 weeks. Botulism toxin is expensive and you should be sure of what costs you will incur resulting from the injection.  The side effects of botulism toxin use for chronic migraine may include:   -Transient, and usually mild, facial weakness with facial injections  -Transient, and usually mild, head or neck weakness with head/neck injections  -Reduction or loss of forehead facial animation due to forehead muscle weakness  -Eyelid drooping  -Dry eye  -Pain at the site of injection or bruising at the site of injection  -Double vision  -Potential unknown long term risks   Contraindications: You should not have Botox if you are pregnant, nursing, allergic to albumin, have an infection, skin condition, or muscle weakness at the site of the injection, or have myasthenia gravis, Lambert-Eaton syndrome, or ALS.  It is also possible that as with any injection, there may be an allergic reaction or no effect from the medication. Reduced effectiveness after repeated injections is sometimes seen and rarely infection at the injection site may occur. All care will be taken to prevent these side effects. If therapy is given over a long time, atrophy and wasting in the muscle injected may occur. Occasionally the patient's become refractory to treatment because they develop antibodies to the toxin. In this event, therapy needs to be modified.  I have read the above information and consent to the administration of botulism toxin.    BOTOX PROCEDURE NOTE FOR MIGRAINE HEADACHE  Contraindications and precautions discussed with patient(above). Aseptic rocedure was observed and patient tolerated procedure.   The condition has  existed for more than 6 months, and pt does not have a diagnosis of ALS, Myasthenia Gravis or Lambert-Eaton Syndrome.  Risks and benefits of injections discussed and pt agrees to proceed with the procedure.  Written consent obtained  These injections are medically necessary. Pt  receives good benefits from these injections. These injections do not cause sedations or hallucinations which the oral therapies may cause.   Description of procedure:  The patient was placed in a sitting position. The standard protocol was used for Botox as follows, with 5 units of Botox injected at each site:  -Procerus muscle, midline injection  -Corrugator muscle, bilateral injection  -Frontalis muscle, bilateral injection, with 2 sites each side, medial injection was performed in the upper one third of the frontalis muscle, in the region vertical from the medial inferior edge of the superior orbital rim. The lateral injection was again in the upper one third of the forehead vertically above the lateral limbus of the cornea, 1.5 cm lateral to the medial injection site.  -Temporalis muscle injection, 4 sites, bilaterally. The first injection was 3 cm above the tragus of the ear, second injection site was 1.5 cm to 3 cm up from the first injection site in line with the tragus of the ear. The third injection site was 1.5-3 cm forward between the first 2 injection sites. The fourth injection site was 1.5 cm posterior to the second injection site. 5th site laterally in the temporalis  muscleat the level of the outer canthus.  -Occipitalis muscle injection, 3 sites, bilaterally. The first injection was done one half way between the occipital protuberance and the tip of the mastoid process behind the ear. The second injection site was done lateral and superior to the first, 1 fingerbreadth from the first injection. The third injection site was 1 fingerbreadth superiorly and medially from the first injection site.  -Cervical  paraspinal muscle injection, 2 sites, bilaterally. The first injection site was 1 cm from the midline of the cervical spine, 3 cm inferior to the lower border of the occipital protuberance. The second injection site was 1.5 cm superiorly and laterally to the first injection site.  -Trapezius muscle injection was performed at 3 sites, bilaterally. The first injection site was in the upper trapezius muscle halfway between the inflection point of the neck, and the acromion. The second injection site was one half way between the acromion and the first injection site. The third injection was done between the first injection site and the inflection point of the neck.  Will return for repeat injection in 3 months.   A total of 155 units of Botox was prepared, 45 units of Botox was injected as documented above, any Botox not injected was wasted. The patient tolerated the procedure well, there were no complications of the above procedure.

## 2022-07-18 ENCOUNTER — Telehealth: Payer: Self-pay | Admitting: Neurology

## 2022-07-18 DIAGNOSIS — R519 Headache, unspecified: Secondary | ICD-10-CM

## 2022-07-18 DIAGNOSIS — M5481 Occipital neuralgia: Secondary | ICD-10-CM

## 2022-07-18 DIAGNOSIS — R200 Anesthesia of skin: Secondary | ICD-10-CM

## 2022-07-18 NOTE — Telephone Encounter (Signed)
Per Dr. Jaynee Eagles: "Pod 4: can you let her know Dr. Domingo Cocking doesn;t take her insurance? Ask if she is willing for a referral to [facility] for refractory occipital neuralgia patient  Dr Cleon Gustin. I'll send a phone note. "  I called patient. She is agreeable to a referral to [facility]. Order placed.

## 2022-07-18 NOTE — Addendum Note (Signed)
Addended by: Lester Reidland A on: 07/18/2022 02:43 PM   Modules accepted: Orders

## 2022-07-18 NOTE — Telephone Encounter (Signed)
Referral sent to Dr. Domingo Cocking @ [place], phone # [phone].

## 2022-07-18 NOTE — Telephone Encounter (Signed)
Dr. Jaynee Eagles: Dr. Kirstie Mirza office called and let us know that pt's insurance is out of network for them. Would you like referral sent somewhere else?

## 2022-07-18 NOTE — Telephone Encounter (Signed)
Referral for pain clinic fax to [facility] (Dr. Leonardo Kapural) Phone: [phone], Fax:  [phone] 

## 2022-07-18 NOTE — Telephone Encounter (Signed)
Pod 4: can you let her know Dr. Domingo Cocking doesn;t take her insurance? Ask if she is willing for a referral to [facility] for refractory occipital neuralgia patient  Dr Cleon Gustin. I'll send a phone note.

## 2022-07-18 NOTE — Telephone Encounter (Signed)
There is already an existing thread. Will document in other encounter.

## 2022-07-27 DIAGNOSIS — F3181 Bipolar II disorder: Secondary | ICD-10-CM | POA: Diagnosis not present

## 2022-07-27 DIAGNOSIS — Z79899 Other long term (current) drug therapy: Secondary | ICD-10-CM | POA: Diagnosis not present

## 2022-08-08 ENCOUNTER — Encounter: Payer: Self-pay | Admitting: *Deleted

## 2022-08-12 NOTE — Progress Notes (Deleted)
08/12/2022 Gail Blackburn [id number] 08-10-1976  Referring provider: Sabino Dick, DO Primary GI doctor: {acdocs:27040}  ASSESSMENT AND PLAN:   There are no diagnoses linked to this encounter.   Patient Care Team: Sabino Dick, DO as PCP - General (Family Medicine)  HISTORY OF PRESENT ILLNESS: 46 y.o. female with a past medical history of migraines, hypothyroidism, bipolar disorder, history of substance abuse occluding narcotics and alcohol, asthma, fibromyalgia, previous colonoscopy Dr. 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  Not treated with anticoagulation. 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  Normal liver, no gallstones no gallbladder wall thickening or pericholecystic fluid.  No biliary dilatation. 03/24/2022 right upper quadrant ultrasound for right upper quadrant pain showed no gallstones or wall thickening, no focal lesion in liver, portal vein patent 03/24/2022 no anemia hemoglobin 13.4, no leukocytosis, platelets 262, liver function normal, kidney function normal, lipase negative  She  reports that she has been smoking cigarettes. She has never used smokeless tobacco. She reports that she does not currently use alcohol. She reports that she does not currently use drugs.  Current Medications:   Current Outpatient Medications (Endocrine & Metabolic):    JENCYCLA 0.35 MG tablet, Take 1 tablet by mouth daily.   methylPREDNISolone (MEDROL DOSEPAK) 4 MG TBPK tablet, Take pills all together daily with food for 6 days. Take first dose as  soon as possible. Take other doses in the morning. 6-5-4-3-2-1.  Current Outpatient Medications (Cardiovascular):    propranolol ER (INDERAL LA) 60 MG 24 hr capsule, Take 1 capsule (60 mg total) by mouth daily.  Current Outpatient Medications (Respiratory):    PROAIR HFA 108 (90 Base) MCG/ACT inhaler, Inhale 2 puffs into the lungs every 6 (six) hours as needed for wheezing or shortness of breath.  Current Outpatient Medications (Analgesics):    ibuprofen (ADVIL) 200 MG tablet, Take 600 mg by mouth every 6 (six) hours as needed (for aches, migraines, or headaches).   Rimegepant Sulfate (NURTEC) 75 MG TBDP, Take 75 mg by mouth daily as needed. For migraines. Take as close to onset of migraine as possible. One daily maximum.   rizatriptan (MAXALT-MLT) 10 MG disintegrating tablet, 1 TABLET FOR MIGRAINE. MAY REPEAT IN 2 HOURS.   SUBOXONE 8-2 MG FILM, Place 1 Film under the tongue in the morning and at bedtime.   Current Outpatient Medications (Other):    ALPRAZolam (XANAX) 0.5 MG tablet, Take 0.5 mg by mouth 4 (four) times daily. PRN   botulinum toxin Type A (BOTOX) 100 units SOLR injection, PROVIDER TO INJECT 155 UNITS INTRAMUSCULARLY INTO HEAD AND NECK EVERY 12 WEEKS. DISCARD REMAINDER.   buPROPion (WELLBUTRIN SR) 150 MG 12 hr tablet, Take 150 mg by mouth in the morning.   cyclobenzaprine (FLEXERIL) 10 MG tablet, Take 1 tablet (10 mg total) by mouth 3 (three) times daily as needed for muscle spasms. Do not take with xanax or gabapentin to avoid significant sedation.   diclofenac sodium (VOLTAREN) 1 % GEL, Apply 4 g topically 4 (four) times daily.   ondansetron (ZOFRAN) 4 MG tablet, TAKE 1 TABLET BY MOUTH EVERY 6 HOURS AS NEEDED FOR NAUSEA FOR VOMITING   valACYclovir (VALTREX) 1000 MG tablet, Take 1,000 mg  by mouth daily as needed (as directed for flares).   venlafaxine XR (EFFEXOR-XR) 75 MG 24 hr capsule, Take 1 capsule (75 mg total) by mouth daily with breakfast. (Patient taking differently:  Take 150 mg by mouth daily with breakfast.)  Medical History:  Past Medical History:  Diagnosis Date   Anemia    PRIOR HISTORY   Anxiety    Asthma    Bipolar disorder (HCC)    Borderline   Depression    Fibromyalgia    Genital herpes simplex    Headache    MIGRAINES   Hepatic lesion 2022   interminate   Hypothyroidism 2009   TOOK MEDS FOR FEW WEEKS NO MEDS NOW   Migraine    Pancreatitis    Splenic vein thrombosis    Thyroid disease    treated in past not sure if too high or too low   Allergies:  Allergies  Allergen Reactions   Latex Rash and Other (See Comments)    Only in vaginal area   Tape Rash and Other (See Comments)    Rashes occur only if left on for a long time     Surgical History:  She  has a past surgical history that includes Wisdom tooth extraction; Colonoscopy; TEETH PULLED (03/2016); Dilation and evacuation (N/A, 05/06/2016); and Foot surgery. Family History:  Her family history includes Alcohol abuse in her maternal aunt and mother; Asthma in her maternal aunt; Bipolar disorder in her maternal aunt and mother; COPD in her maternal aunt; Drug abuse in her maternal aunt; Luiz Blare' disease in her cousin; Heart disease in her maternal grandmother; Hyperlipidemia in her maternal aunt, maternal grandmother, and mother; Hypertension in her maternal grandmother; Leukemia in her maternal grandmother; Stroke in her maternal grandmother.  REVIEW OF SYSTEMS  : All other systems reviewed and negative except where noted in the History of Present Illness.  PHYSICAL EXAM: There were no vitals taken for this visit. General:   Pleasant, well developed female in no acute distress Head:   Normocephalic and atraumatic. Eyes:  sclerae anicteric,conjunctive pink  Heart:   {HEART EXAM HEM/ONC:21750} Pulm:  Clear anteriorly; no wheezing Abdomen:   {BlankSingle:19197::"Distended","Ridged","Soft"}, {BlankSingle:19197::"Flat","Obese","Non-distended"} AB,  {BlankSingle:19197::"Absent","Hyperactive, tinkling","Hypoactive","Sluggish","Active"} bowel sounds. {actendernessAB:27319} tenderness {anatomy; site abdomen:5010}. {BlankMultiple:19196::"Without guarding","With guarding","Without rebound","With rebound"}, No organomegaly appreciated. Rectal: {acrectalexam:27461} Extremities:  {With/Without:304960234} edema. Msk: Symmetrical without gross deformities. Peripheral pulses intact.  Neurologic:  Alert and  oriented x4;  No focal deficits.  Skin:   Dry and intact without significant lesions or rashes. Psychiatric:  Cooperative. Normal mood and affect.    Doree Albee, PA-C 10:07 AM

## 2022-08-15 ENCOUNTER — Ambulatory Visit: Payer: Medicaid Other | Admitting: Physician Assistant

## 2022-08-15 DIAGNOSIS — F3181 Bipolar II disorder: Secondary | ICD-10-CM | POA: Diagnosis not present

## 2022-08-17 ENCOUNTER — Encounter: Payer: Self-pay | Admitting: *Deleted

## 2022-09-02 DIAGNOSIS — F3181 Bipolar II disorder: Secondary | ICD-10-CM | POA: Diagnosis not present

## 2022-09-15 DIAGNOSIS — H5213 Myopia, bilateral: Secondary | ICD-10-CM | POA: Diagnosis not present

## 2022-09-16 DIAGNOSIS — F3181 Bipolar II disorder: Secondary | ICD-10-CM | POA: Diagnosis not present

## 2022-09-22 ENCOUNTER — Encounter: Payer: Self-pay | Admitting: *Deleted

## 2022-09-23 ENCOUNTER — Inpatient Hospital Stay (HOSPITAL_COMMUNITY)
Admission: EM | Admit: 2022-09-23 | Discharge: 2022-09-29 | DRG: 440 | Disposition: A | Discharge status: Home / Self Care | Payer: Medicaid Other | Attending: Family Medicine | Admitting: Family Medicine

## 2022-09-23 DIAGNOSIS — F1110 Opioid abuse, uncomplicated: Secondary | ICD-10-CM | POA: Diagnosis present

## 2022-09-23 DIAGNOSIS — K219 Gastro-esophageal reflux disease without esophagitis: Secondary | ICD-10-CM | POA: Diagnosis present

## 2022-09-23 DIAGNOSIS — E876 Hypokalemia: Secondary | ICD-10-CM | POA: Diagnosis present

## 2022-09-23 DIAGNOSIS — Z20822 Contact with and (suspected) exposure to covid-19: Secondary | ICD-10-CM | POA: Diagnosis present

## 2022-09-23 DIAGNOSIS — Z9109 Other allergy status, other than to drugs and biological substances: Secondary | ICD-10-CM

## 2022-09-23 DIAGNOSIS — F319 Bipolar disorder, unspecified: Secondary | ICD-10-CM | POA: Diagnosis present

## 2022-09-23 DIAGNOSIS — Z793 Long term (current) use of hormonal contraceptives: Secondary | ICD-10-CM

## 2022-09-23 DIAGNOSIS — F1020 Alcohol dependence, uncomplicated: Secondary | ICD-10-CM | POA: Diagnosis present

## 2022-09-23 DIAGNOSIS — Z86718 Personal history of other venous thrombosis and embolism: Secondary | ICD-10-CM

## 2022-09-23 DIAGNOSIS — J45909 Unspecified asthma, uncomplicated: Secondary | ICD-10-CM | POA: Diagnosis present

## 2022-09-23 DIAGNOSIS — G43909 Migraine, unspecified, not intractable, without status migrainosus: Secondary | ICD-10-CM | POA: Diagnosis present

## 2022-09-23 DIAGNOSIS — R9431 Abnormal electrocardiogram [ECG] [EKG]: Secondary | ICD-10-CM | POA: Diagnosis not present

## 2022-09-23 DIAGNOSIS — Z79891 Long term (current) use of opiate analgesic: Secondary | ICD-10-CM

## 2022-09-23 DIAGNOSIS — R1013 Epigastric pain: Secondary | ICD-10-CM | POA: Diagnosis not present

## 2022-09-23 DIAGNOSIS — K859 Acute pancreatitis without necrosis or infection, unspecified: Secondary | ICD-10-CM

## 2022-09-23 DIAGNOSIS — F419 Anxiety disorder, unspecified: Secondary | ICD-10-CM | POA: Diagnosis present

## 2022-09-23 DIAGNOSIS — R197 Diarrhea, unspecified: Secondary | ICD-10-CM | POA: Diagnosis present

## 2022-09-23 DIAGNOSIS — Z91040 Latex allergy status: Secondary | ICD-10-CM

## 2022-09-23 DIAGNOSIS — R109 Unspecified abdominal pain: Secondary | ICD-10-CM | POA: Diagnosis present

## 2022-09-23 DIAGNOSIS — Z79899 Other long term (current) drug therapy: Secondary | ICD-10-CM

## 2022-09-23 DIAGNOSIS — F17210 Nicotine dependence, cigarettes, uncomplicated: Secondary | ICD-10-CM | POA: Diagnosis present

## 2022-09-23 DIAGNOSIS — R112 Nausea with vomiting, unspecified: Secondary | ICD-10-CM | POA: Diagnosis not present

## 2022-09-23 DIAGNOSIS — R748 Abnormal levels of other serum enzymes: Secondary | ICD-10-CM | POA: Diagnosis present

## 2022-09-23 DIAGNOSIS — K852 Alcohol induced acute pancreatitis without necrosis or infection: Principal | ICD-10-CM | POA: Diagnosis present

## 2022-09-23 NOTE — ED Triage Notes (Signed)
The pt ic/o abd pain and vomiting for several days  she has been drinking alcohol also for several days hx of pancreatitis

## 2022-09-24 ENCOUNTER — Observation Stay (HOSPITAL_COMMUNITY): Payer: Medicaid Other

## 2022-09-24 ENCOUNTER — Encounter (HOSPITAL_COMMUNITY): Payer: Self-pay | Admitting: *Deleted

## 2022-09-24 ENCOUNTER — Other Ambulatory Visit: Payer: Self-pay

## 2022-09-24 DIAGNOSIS — R1013 Epigastric pain: Secondary | ICD-10-CM | POA: Diagnosis not present

## 2022-09-24 DIAGNOSIS — R1011 Right upper quadrant pain: Secondary | ICD-10-CM | POA: Diagnosis not present

## 2022-09-24 DIAGNOSIS — K859 Acute pancreatitis without necrosis or infection, unspecified: Secondary | ICD-10-CM

## 2022-09-24 DIAGNOSIS — R109 Unspecified abdominal pain: Secondary | ICD-10-CM | POA: Diagnosis not present

## 2022-09-24 LAB — CBC WITH DIFFERENTIAL/PLATELET
Abs Immature Granulocytes: 0.09 10*3/uL — ABNORMAL HIGH (ref 0.00–0.07)
Basophils Absolute: 0.1 10*3/uL (ref 0.0–0.1)
Basophils Relative: 1 %
Eosinophils Absolute: 0.5 10*3/uL (ref 0.0–0.5)
Eosinophils Relative: 3 %
HCT: 41.2 % (ref 36.0–46.0)
Hemoglobin: 14.3 g/dL (ref 12.0–15.0)
Immature Granulocytes: 1 %
Lymphocytes Relative: 17 %
Lymphs Abs: 2.5 10*3/uL (ref 0.7–4.0)
MCH: 34.2 pg — ABNORMAL HIGH (ref 26.0–34.0)
MCHC: 34.7 g/dL (ref 30.0–36.0)
MCV: 98.6 fL (ref 80.0–100.0)
Monocytes Absolute: 1 10*3/uL (ref 0.1–1.0)
Monocytes Relative: 7 %
Neutro Abs: 11 10*3/uL — ABNORMAL HIGH (ref 1.7–7.7)
Neutrophils Relative %: 71 %
Platelets: 269 10*3/uL (ref 150–400)
RBC: 4.18 MIL/uL (ref 3.87–5.11)
RDW: 13.9 % (ref 11.5–15.5)
WBC: 15.2 10*3/uL — ABNORMAL HIGH (ref 4.0–10.5)
nRBC: 0 % (ref 0.0–0.2)

## 2022-09-24 LAB — RESP PANEL BY RT-PCR (RSV, FLU A&B, COVID)  RVPGX2
Influenza A by PCR: NEGATIVE
Influenza B by PCR: NEGATIVE
Resp Syncytial Virus by PCR: NEGATIVE
SARS Coronavirus 2 by RT PCR: NEGATIVE

## 2022-09-24 LAB — COMPREHENSIVE METABOLIC PANEL
ALT: 12 U/L (ref 0–44)
AST: 17 U/L (ref 15–41)
Albumin: 4.4 g/dL (ref 3.5–5.0)
Alkaline Phosphatase: 53 U/L (ref 38–126)
Anion gap: 8 (ref 5–15)
BUN: 5 mg/dL — ABNORMAL LOW (ref 6–20)
CO2: 24 mmol/L (ref 22–32)
Calcium: 9.3 mg/dL (ref 8.9–10.3)
Chloride: 104 mmol/L (ref 98–111)
Creatinine, Ser: 0.76 mg/dL (ref 0.44–1.00)
GFR, Estimated: >60 mL/min (ref >60)
Glucose, Bld: 117 mg/dL — ABNORMAL HIGH (ref 70–99)
Potassium: 3.5 mmol/L (ref 3.5–5.1)
Sodium: 136 mmol/L (ref 135–145)
Total Bilirubin: 0.8 mg/dL (ref 0.3–1.2)
Total Protein: 7.1 g/dL (ref 6.5–8.1)

## 2022-09-24 LAB — URINALYSIS, ROUTINE W REFLEX MICROSCOPIC
Bacteria, UA: NONE SEEN
Bilirubin Urine: NEGATIVE
Glucose, UA: NEGATIVE mg/dL
Ketones, ur: NEGATIVE mg/dL
Leukocytes,Ua: NEGATIVE
Nitrite: NEGATIVE
Protein, ur: 30 mg/dL — AB
Specific Gravity, Urine: 1.024 (ref 1.005–1.030)
pH: 6 (ref 5.0–8.0)

## 2022-09-24 LAB — I-STAT BETA HCG BLOOD, ED (MC, WL, AP ONLY): I-stat hCG, quantitative: <5 m[IU]/mL (ref <5)

## 2022-09-24 LAB — LIPASE, BLOOD: Lipase: 141 U/L — ABNORMAL HIGH (ref 11–51)

## 2022-09-24 LAB — TROPONIN I (HIGH SENSITIVITY): Troponin I (High Sensitivity): 4 ng/L (ref <18)

## 2022-09-24 LAB — HIV ANTIBODY (ROUTINE TESTING W REFLEX): HIV Screen 4th Generation wRfx: NONREACTIVE

## 2022-09-24 MED ORDER — RIMEGEPANT SULFATE 75 MG PO TBDP: 75.0000 mg | ORAL_TABLET | Freq: Every day | ORAL | Status: DC | PRN

## 2022-09-24 MED ORDER — IOHEXOL 350 MG/ML SOLN
75.0000 mL | Freq: Once | INTRAVENOUS | Status: AC | PRN
  Administered 2022-09-24: 75 mL via INTRAVENOUS

## 2022-09-24 MED ORDER — ACETAMINOPHEN 10 MG/ML IV SOLN: 1000.0000 mg | Freq: Three times a day (TID) | INTRAVENOUS | Status: DC

## 2022-09-24 MED ORDER — LACTATED RINGERS IV SOLN: INTRAVENOUS | Status: DC

## 2022-09-24 MED ORDER — ACETAMINOPHEN 10 MG/ML IV SOLN
1000.0000 mg | Freq: Four times a day (QID) | INTRAVENOUS | Status: AC
  Administered 2022-09-24 – 2022-09-25 (×4): 1000 mg via INTRAVENOUS
  Filled 2022-09-24 (×4): qty 100

## 2022-09-24 MED ORDER — ALBUTEROL SULFATE HFA 108 (90 BASE) MCG/ACT IN AERS: 2.0000 | INHALATION_SPRAY | Freq: Four times a day (QID) | RESPIRATORY_TRACT | Status: DC | PRN

## 2022-09-24 MED ORDER — BUPRENORPHINE HCL-NALOXONE HCL 8-2 MG SL SUBL
1.0000 | SUBLINGUAL_TABLET | Freq: Two times a day (BID) | SUBLINGUAL | Status: DC
  Administered 2022-09-24 (×2): 1 via SUBLINGUAL
  Filled 2022-09-24 (×2): qty 1

## 2022-09-24 MED ORDER — ACETAMINOPHEN 325 MG PO TABS: 650.0000 mg | ORAL_TABLET | Freq: Four times a day (QID) | ORAL | Status: DC

## 2022-09-24 MED ORDER — NORETHINDRONE 0.35 MG PO TABS: 1.0000 | ORAL_TABLET | Freq: Every day | ORAL | Status: DC

## 2022-09-24 MED ORDER — KETOROLAC TROMETHAMINE 30 MG/ML IJ SOLN
30.0000 mg | Freq: Three times a day (TID) | INTRAMUSCULAR | Status: DC | PRN
  Administered 2022-09-24: 30 mg via INTRAVENOUS
  Filled 2022-09-24: qty 1

## 2022-09-24 MED ORDER — ONDANSETRON 4 MG PO TBDP
4.0000 mg | ORAL_TABLET | Freq: Once | ORAL | Status: AC
  Administered 2022-09-24: 4 mg via ORAL
  Filled 2022-09-24: qty 1

## 2022-09-24 MED ORDER — ALBUTEROL SULFATE (2.5 MG/3ML) 0.083% IN NEBU: 2.5000 mg | INHALATION_SOLUTION | RESPIRATORY_TRACT | Status: DC | PRN

## 2022-09-24 MED ORDER — PROMETHAZINE HCL 25 MG PO TABS
12.5000 mg | ORAL_TABLET | Freq: Four times a day (QID) | ORAL | Status: DC | PRN
  Administered 2022-09-24: 12.5 mg via ORAL
  Filled 2022-09-24: qty 1

## 2022-09-24 MED ORDER — DICLOFENAC SODIUM 1 % EX GEL
4.0000 g | Freq: Four times a day (QID) | CUTANEOUS | Status: DC
  Filled 2022-09-24 (×2): qty 100

## 2022-09-24 MED ORDER — KETOROLAC TROMETHAMINE 30 MG/ML IJ SOLN
30.0000 mg | Freq: Four times a day (QID) | INTRAMUSCULAR | Status: AC | PRN
  Administered 2022-09-25 (×3): 30 mg via INTRAVENOUS
  Filled 2022-09-24 (×3): qty 1

## 2022-09-24 MED ORDER — ADULT MULTIVITAMIN W/MINERALS CH
1.0000 | ORAL_TABLET | Freq: Every day | ORAL | Status: DC
  Administered 2022-09-25 – 2022-09-29 (×5): 1 via ORAL
  Filled 2022-09-24 (×6): qty 1

## 2022-09-24 MED ORDER — FAMOTIDINE IN NACL 20-0.9 MG/50ML-% IV SOLN
20.0000 mg | Freq: Once | INTRAVENOUS | Status: AC
  Administered 2022-09-24: 20 mg via INTRAVENOUS
  Filled 2022-09-24: qty 50

## 2022-09-24 MED ORDER — THIAMINE MONONITRATE 100 MG PO TABS
100.0000 mg | ORAL_TABLET | Freq: Every day | ORAL | Status: DC
  Administered 2022-09-25 – 2022-09-29 (×5): 100 mg via ORAL
  Filled 2022-09-24 (×6): qty 1

## 2022-09-24 MED ORDER — ALPRAZOLAM 0.25 MG PO TABS
0.5000 mg | ORAL_TABLET | Freq: Three times a day (TID) | ORAL | Status: DC | PRN
  Administered 2022-09-24 – 2022-09-29 (×15): 0.5 mg via ORAL
  Filled 2022-09-24 (×15): qty 2

## 2022-09-24 MED ORDER — HYDROMORPHONE HCL 1 MG/ML IJ SOLN
0.5000 mg | Freq: Once | INTRAMUSCULAR | Status: AC
  Administered 2022-09-24: 0.5 mg via INTRAVENOUS
  Filled 2022-09-24: qty 0.5

## 2022-09-24 MED ORDER — LORAZEPAM 2 MG/ML IJ SOLN: 1.0000 mg | INTRAMUSCULAR | Status: DC | PRN

## 2022-09-24 MED ORDER — LORAZEPAM 1 MG PO TABS: 1.0000 mg | ORAL_TABLET | ORAL | Status: DC | PRN

## 2022-09-24 MED ORDER — THIAMINE HCL 100 MG/ML IJ SOLN
100.0000 mg | Freq: Every day | INTRAMUSCULAR | Status: DC
  Administered 2022-09-24: 100 mg via INTRAVENOUS
  Filled 2022-09-24 (×3): qty 2

## 2022-09-24 MED ORDER — ENOXAPARIN SODIUM 40 MG/0.4ML IJ SOSY
40.0000 mg | PREFILLED_SYRINGE | INTRAMUSCULAR | Status: DC
  Administered 2022-09-24 – 2022-09-28 (×5): 40 mg via SUBCUTANEOUS
  Filled 2022-09-24 (×5): qty 0.4

## 2022-09-24 MED ORDER — PANTOPRAZOLE SODIUM 40 MG IV SOLR
40.0000 mg | Freq: Once | INTRAVENOUS | Status: AC
  Administered 2022-09-24: 40 mg via INTRAVENOUS
  Filled 2022-09-24: qty 10

## 2022-09-24 MED ORDER — ACETAMINOPHEN 650 MG RE SUPP: 650.0000 mg | Freq: Four times a day (QID) | RECTAL | Status: DC | PRN

## 2022-09-24 MED ORDER — METOCLOPRAMIDE HCL 5 MG/ML IJ SOLN
5.0000 mg | Freq: Four times a day (QID) | INTRAMUSCULAR | Status: DC | PRN
  Administered 2022-09-24 – 2022-09-25 (×3): 5 mg via INTRAVENOUS
  Filled 2022-09-24 (×4): qty 2

## 2022-09-24 MED ORDER — BUPROPION HCL ER (SR) 150 MG PO TB12
150.0000 mg | ORAL_TABLET | Freq: Every morning | ORAL | Status: DC
  Administered 2022-09-25 – 2022-09-29 (×5): 150 mg via ORAL
  Filled 2022-09-24 (×6): qty 1

## 2022-09-24 MED ORDER — VENLAFAXINE HCL ER 75 MG PO CP24
75.0000 mg | ORAL_CAPSULE | Freq: Every day | ORAL | Status: DC
  Administered 2022-09-25 – 2022-09-29 (×5): 75 mg via ORAL
  Filled 2022-09-24 (×6): qty 1

## 2022-09-24 MED ORDER — RIZATRIPTAN BENZOATE 10 MG PO TBDP: 10.0000 mg | ORAL_TABLET | Freq: Once | ORAL | Status: DC | PRN

## 2022-09-24 MED ORDER — KETOROLAC TROMETHAMINE 60 MG/2ML IM SOLN
60.0000 mg | Freq: Once | INTRAMUSCULAR | Status: AC
  Administered 2022-09-24: 60 mg via INTRAMUSCULAR
  Filled 2022-09-24: qty 2

## 2022-09-24 MED ORDER — HYDROMORPHONE HCL 1 MG/ML IJ SOLN
1.0000 mg | Freq: Once | INTRAMUSCULAR | Status: DC
  Filled 2022-09-24: qty 1

## 2022-09-24 MED ORDER — ACETAMINOPHEN 325 MG PO TABS: 650.0000 mg | ORAL_TABLET | Freq: Four times a day (QID) | ORAL | Status: DC | PRN

## 2022-09-24 MED ORDER — LACTATED RINGERS IV BOLUS
2000.0000 mL | Freq: Once | INTRAVENOUS | Status: AC
  Administered 2022-09-24: 2000 mL via INTRAVENOUS

## 2022-09-24 MED ORDER — FOLIC ACID 1 MG PO TABS
1.0000 mg | ORAL_TABLET | Freq: Every day | ORAL | Status: DC
  Administered 2022-09-25 – 2022-09-29 (×5): 1 mg via ORAL
  Filled 2022-09-24 (×6): qty 1

## 2022-09-24 MED ORDER — PROPRANOLOL HCL ER 60 MG PO CP24
60.0000 mg | ORAL_CAPSULE | Freq: Every day | ORAL | Status: DC
  Administered 2022-09-25 – 2022-09-29 (×4): 60 mg via ORAL
  Filled 2022-09-24 (×6): qty 1

## 2022-09-24 MED ORDER — ONDANSETRON HCL 4 MG/2ML IJ SOLN
4.0000 mg | Freq: Four times a day (QID) | INTRAMUSCULAR | Status: DC | PRN
  Administered 2022-09-24: 4 mg via INTRAVENOUS
  Filled 2022-09-24: qty 2

## 2022-09-24 MED ORDER — METOCLOPRAMIDE HCL 5 MG/ML IJ SOLN
10.0000 mg | INTRAMUSCULAR | Status: AC
  Administered 2022-09-24: 10 mg via INTRAVENOUS
  Filled 2022-09-24: qty 2

## 2022-09-24 NOTE — Progress Notes (Incomplete)
     Daily Progress Note Intern Pager: [phone]  Patient name: Gail Blackburn Medical record number: [id number] Date of birth: [birth year] Age: 46 y.o. Gender: female  Primary Care Provider: Sharion Settler, DO Consultants: None  Code Status: Full  Pt Overview and Major Events to Date:  12/30: Admitted  Assessment and Plan: Dealie Koelzer is a 46 year old female with past medical history of alcohol use disorder, OUD on Suboxone, bipolar disorder, and severe migraines admitted for abdominal pain concerning for pancreatitis.  Abdominal pain Most likely due to pancreatitis as lipase is 141. AST/ALT 17/12 with alk phos wnl.  - Admit to FPTS, Attending Dr. Owens Shark  - Continue LR 125 ml/hr  - Reglan q6hrs prn, patient's nausea not relieved by zofran  - IV tylenol 1000 q6hrs scheduled, IV toradol q8hrs prn  - Patient would like to avoid opioid analgesics due to her h/o OUD  - F/u RUQ ultrasound  - CT Abdomen Pelvis given unclear lipase and patient does not fully meet pancreatitis criteria, h/o splenic vein thrombosis  - NPO with sips with meds  - PT eval and treat  Alcohol use disorder, moderate, dependence ([place]) Patient drinks 2-3 16-40 oz drinks a day. Last drink on 12/29 pm. Does have history of withdrawal without seizures. Alcohol use has caused pancreatitis in the past.  - CIWA protocol  - Continue home xanax TID prn  - TOC consult placed  - Folic acid, thiamine, multivitamin supplementation     Chronic and Stable Conditions OUD: Continue suboxone 8-2 mg BID  Migraine: continue propranolol 60 mg  Bipolar disorder: Continue wellbutrin 150 mg, effexor 75 mg daily, xanax TID prn Asthma: Continue home albuterol     FEN/GI: NPO PPx: Levonox Dispo:Home in 2-3 days. Barriers include clinical status.   Subjective:  ***  Objective: Temp:  [97.7 F (36.5 C)-98.8 F (37.1 C)] 98.8 F (37.1 C) (12/30 1947) Pulse Rate:  [53-80] 79 (12/30 1947) Resp:  [15-18] 18 (12/30  1947) BP: (115-142)/(70-98) 130/79 (12/30 1947) SpO2:  [97 %-100 %] 100 % (12/30 1947) Weight:  [55.3 kg] 55.3 kg (12/30 0001) Physical Exam: General: *** Cardiovascular: *** Respiratory: *** Abdomen: *** Extremities: ***  Laboratory: Most recent CBC Lab Results  Component Value Date   WBC 15.2 (H) 09/24/2022   HGB 14.3 09/24/2022   HCT 41.2 09/24/2022   MCV 98.6 09/24/2022   PLT 269 09/24/2022   Most recent BMP    Latest Ref Rng & Units 09/24/2022   12:07 AM  BMP  Glucose 70 - 99 mg/dL 117   BUN 6 - 20 mg/dL 5   Creatinine 0.44 - 1.00 mg/dL 0.76   Sodium 135 - 145 mmol/L 136   Potassium 3.5 - 5.1 mmol/L 3.5   Chloride 98 - 111 mmol/L 104   CO2 22 - 32 mmol/L 24   Calcium 8.9 - 10.3 mg/dL 9.3     Other pertinent labs ***   Imaging/Diagnostic Tests: Radiologist Impression: *** My interpretation: Alen Bleacher, MD 09/24/2022, 11:43 PM  PGY-***, [place] Intern pager: [phone], text pages welcome Secure chat group [place]

## 2022-09-24 NOTE — Assessment & Plan Note (Addendum)
History of alcohol use disorder with reported last drink on 12/29 pm.  CIWA scores 2>1. Patient feels confident and well supported in plan to maintain sobriety at home.  - 4 days since last drink and given stable CIWA, will d/c  - Continue home xanax TID prn  - Appreciate TOC consult - continue Folic acid, thiamine, multivitamin supplementation

## 2022-09-24 NOTE — ED Triage Notes (Signed)
Last alcohol was at 1500 today  lmp last week

## 2022-09-24 NOTE — Hospital Course (Signed)
Gail Blackburn is a 46 y.o.female with a history of AUD, H/o alcoholic pancreatitis c/b splenic vein thrombosis, OUD, migraines, bipolar disorder. who was admitted to the [facility] Medicine Teaching Service at [facility] for ***. {Blank single:19197::"His","Her"} hospital course is detailed below:   Other chronic conditions were medically managed with home medications and formulary alternatives as necessary (***)  PCP Follow-up Recommendations:

## 2022-09-24 NOTE — ED Provider Notes (Cosign Needed)
[facility] Provider Note   CSN: [id number] Arrival date & time: 09/23/22  2352     History  Chief Complaint  Patient presents with   Abdominal Pain    Gail Blackburn is a 46 y.o. female, h/o pancreatitis, alcohol use disorder, prior opioid use (on suboxone), anxiety, who presented for 3d h/o abd pain w/ N/V. Patient stated before onset of symptoms she was drinking 2-3 16oz beers the days prior. Patient also states she "does not use mariijuana often but did smoke a bowl 2 days ago." Patient endorsed intractable bilious emesis without blood and not being able to keep food/drink down. Abd pain is 8/10 burning/dull constant pain in the epigastric region that radiates to her back. Patient denied CP, shortness of breath, syncope, dysuria, vision changes, HA, fever, trauma, skin color changes.    Home Medications Prior to Admission medications   Medication Sig Start Date End Date Taking? Authorizing Provider  ALPRAZolam Prudy Feeler(XANAX) 0.5 MG tablet Take 0.5 mg by mouth 4 (four) times daily. PRN 10/03/21   [provider]  botulinum toxin Type A (BOTOX) 100 units SOLR injection PROVIDER TO INJECT 155 UNITS INTRAMUSCULARLY INTO HEAD AND NECK EVERY 12 WEEKS. DISCARD REMAINDER. 01/06/22   Anson FretAhern, Antonia B, MD  buPROPion [facility](WELLBUTRIN SR) 150 MG 12 hr tablet Take 150 mg by mouth in the morning.    [provider]  cyclobenzaprine (FLEXERIL) 10 MG tablet Take 1 tablet (10 mg total) by mouth 3 (three) times daily as needed for muscle spasms. Do not take with xanax or gabapentin to avoid significant sedation. 01/26/22   Anson FretAhern, Antonia B, MD  diclofenac sodium (VOLTAREN) 1 % GEL Apply 4 g topically 4 (four) times daily. 06/21/18   Anson FretAhern, Antonia B, MD  ibuprofen (ADVIL) 200 MG tablet Take 600 mg by mouth every 6 (six) hours as needed (for aches, migraines, or headaches).    [provider]  JENCYCLA 0.35 MG tablet Take 1 tablet by mouth daily.    [provider]  methylPREDNISolone (MEDROL DOSEPAK) 4 MG TBPK tablet Take pills all together daily with food for 6 days. Take first dose as soon as possible. Take other doses in the morning. 6-5-4-3-2-1. 01/26/22   Anson FretAhern, Antonia B, MD  ondansetron (ZOFRAN) 4 MG tablet TAKE 1 TABLET BY MOUTH EVERY 6 HOURS AS NEEDED FOR NAUSEA FOR VOMITING 08/04/21   Lomax, Amy, NP  PROAIR HFA 108 (90 Base) MCG/ACT inhaler Inhale 2 puffs into the lungs every 6 (six) hours as needed for wheezing or shortness of breath.    [provider]  propranolol ER (INDERAL LA) 60 MG 24 hr capsule Take 1 capsule (60 mg total) by mouth daily. 03/21/22   Lomax, Amy, NP  Rimegepant Sulfate (NURTEC) 75 MG TBDP Take 75 mg by mouth daily as needed. For migraines. Take as close to onset of migraine as possible. One daily maximum. 04/26/22   Anson FretAhern, Antonia B, MD  rizatriptan (MAXALT-MLT) 10 MG disintegrating tablet 1 TABLET FOR MIGRAINE. MAY REPEAT IN 2 HOURS. 03/21/22   Lomax, Amy, NP  SUBOXONE 8-2 MG FILM Place 1 Film under the tongue in the morning and at bedtime.    [provider]  valACYclovir (VALTREX) 1000 MG tablet Take 1,000 mg by mouth daily as needed (as directed for flares).    [provider]  venlafaxine XR (EFFEXOR-XR) 75 MG 24 hr capsule Take 1 capsule (75 mg total) by mouth daily with breakfast. Patient taking differently:  Take 150 mg by mouth daily with breakfast. 11/10/20   Mirian Mo, MD      Allergies    Latex and Tape    Review of Systems   Review of Systems  Gastrointestinal:  Positive for abdominal pain.  See HPI  Physical Exam Updated Vital Signs BP 130/79 (BP Location: Left Arm)   Pulse 79   Temp 98.8 F (37.1 C) (Oral)   Resp 18   Ht 5\' 5"  (1.651 m)   Wt 55.3 kg   SpO2 100%   BMI 20.29 kg/m  Physical Exam Constitutional:      General: She is not in acute distress.    Appearance: She is normal weight. She is not toxic-appearing or diaphoretic.  HENT:     Head:  Normocephalic.  Eyes:     Extraocular Movements: Extraocular movements intact.     Pupils: Pupils are equal, round, and reactive to light.  Cardiovascular:     Rate and Rhythm: Regular rhythm. Bradycardia present.     Heart sounds: Normal heart sounds.  Pulmonary:     Effort: Pulmonary effort is normal.     Breath sounds: Normal breath sounds.  Abdominal:     General: Abdomen is flat. Bowel sounds are normal. There is no distension.     Palpations: Abdomen is soft. There is no hepatomegaly or splenomegaly.     Tenderness: There is abdominal tenderness in the epigastric area. There is no right CVA tenderness, left CVA tenderness, guarding or rebound.     Hernia: No hernia is present.  Musculoskeletal:     Cervical back: Normal range of motion.  Skin:    General: Skin is warm and dry.     Capillary Refill: Capillary refill takes less than 2 seconds.  Neurological:     General: No focal deficit present.     Mental Status: She is alert and oriented to person, place, and time.  Psychiatric:        Mood and Affect: Mood normal.     ED Results / Procedures / Treatments   Labs (all labs ordered are listed, but only abnormal results are displayed) Labs Reviewed  CBC WITH DIFFERENTIAL/PLATELET - Abnormal; Notable for the following components:      Result Value   WBC 15.2 (*)    MCH 34.2 (*)    Neutro Abs 11.0 (*)    Abs Immature Granulocytes 0.09 (*)    All other components within normal limits  COMPREHENSIVE METABOLIC PANEL - Abnormal; Notable for the following components:   Glucose, Bld 117 (*)    BUN 5 (*)    All other components within normal limits  LIPASE, BLOOD - Abnormal; Notable for the following components:   Lipase 141 (*)    All other components within normal limits  URINALYSIS, ROUTINE W REFLEX MICROSCOPIC - Abnormal; Notable for the following components:   Hgb urine dipstick SMALL (*)    Protein, ur 30 (*)    All other components within normal limits  RESP PANEL  BY RT-PCR (RSV, FLU A&B, COVID)  RVPGX2  HIV ANTIBODY (ROUTINE TESTING W REFLEX)  BASIC METABOLIC PANEL  CBC  I-STAT BETA HCG BLOOD, ED ([facility], [facility], [facility] ONLY)  TROPONIN I (HIGH SENSITIVITY)  TROPONIN I (HIGH SENSITIVITY)    EKG None  Radiology CT ABDOMEN PELVIS W CONTRAST  Result Date: 09/24/2022 CLINICAL DATA:  46 year old female with epigastric pain. Query pancreatitis, splenic vein thrombosis. EXAM: CT ABDOMEN AND PELVIS WITH CONTRAST TECHNIQUE: Multidetector  CT imaging of the abdomen and pelvis was performed using the standard protocol following bolus administration of intravenous contrast. RADIATION DOSE REDUCTION: This exam was performed according to the departmental dose-optimization program which includes automated exposure control, adjustment of the mA and/or kV according to patient size and/or use of iterative reconstruction technique. CONTRAST:  16mL OMNIPAQUE IOHEXOL 350 MG/ML SOLN COMPARISON:  CT Abdomen and Pelvis 12/03/2020. FINDINGS: Lower chest: Negative.  No pericardial or pleural effusion. Hepatobiliary: Mild periportal edema. Inflammation at the porta hepatis appears secondary to the pancreas. Secondary inflammation of the gallbladder. Liver enhancement remains normal. Pancreas: Diffuse peripancreatic inflammation, moderate. Some heterogeneous pancreatic enhancement but no overt pancreatic necrosis. No ductal dilatation, atrophy, or mass identified. Inflammation tracks out from the lesser sac including into the root of the small bowel mesentery. No organized or drainable fluid collection. Spleen: Mild inflammation at the splenic hilum but otherwise negative. Splenic artery and vein remain patent. Adrenals/Urinary Tract: Negative. Stomach/Bowel: No dilated large or small bowel in the abdomen or pelvis. Oral contrast in the ascending colon. Secondary inflammation of the stomach and especially the duodenum from inflammation in the lesser sac. No free air. No organized or drainable fluid  collection. Vascular/Lymphatic: Portal venous system including the splenic vein (series 3, image 24) is patent. Major arterial structures in the abdomen and pelvis also appear patent and normal. No lymphadenopathy identified. Reproductive: Within normal limits. Other: Trace free fluid in the pelvis, simple fluid density and more so on the right. Musculoskeletal: Negative. IMPRESSION: 1. Positive for Acute Pancreatitis. Moderate peripancreatic inflammation. No pancreatic necrosis, splenic vein thrombosis, or other complicating feature. No organized or drainable fluid collection at this time. Trace associated free fluid in the pelvis. 2. No other acute or inflammatory process identified in the abdomen or pelvis. Electronically Signed   By: Odessa Fleming M.D.   On: 09/24/2022 11:31   US Abdomen Limited RUQ (LIVER/GB)  Result Date: 09/24/2022 CLINICAL DATA:  Right upper quadrant pain EXAM: ULTRASOUND ABDOMEN LIMITED RIGHT UPPER QUADRANT COMPARISON:  None Available. FINDINGS: Gallbladder: No gallstones or wall thickening visualized. No sonographic Murphy sign noted by sonographer. Common bile duct: Diameter: Normal caliber, 3 mm Liver: No focal lesion identified. Within normal limits in parenchymal echogenicity. Portal vein is patent on color Doppler imaging with normal direction of blood flow towards the liver. Other: None. IMPRESSION: Unremarkable right upper quadrant ultrasound Electronically Signed   By: Charlett Nose M.D.   On: 09/24/2022 09:31    Procedures Procedures   Medications Ordered in ED Medications  lactated ringers infusion ( Intravenous New Bag/Given 09/24/22 2047)  thiamine (VITAMIN B1) tablet 100 mg ( Oral See Alternative 09/24/22 1332)    Or  thiamine (VITAMIN B1) injection 100 mg (100 mg Intravenous Given 09/24/22 1332)  folic acid (FOLVITE) tablet 1 mg (1 mg Oral Not Given 09/24/22 1109)  multivitamin with minerals tablet 1 tablet (1 tablet Oral Not Given 09/24/22 1109)  buPROPion  (WELLBUTRIN SR) 12 hr tablet 150 mg (150 mg Oral Not Given 09/24/22 1110)  venlafaxine XR (EFFEXOR-XR) 24 hr capsule 75 mg (75 mg Oral Not Given 09/24/22 1110)  ALPRAZolam (XANAX) tablet 0.5 mg (0.5 mg Oral Given 09/24/22 1638)  buprenorphine-naloxone (SUBOXONE) 8-2 mg per SL tablet 1 tablet (1 tablet Sublingual Given 09/24/22 2047)  propranolol ER (INDERAL LA) 24 hr capsule 60 mg (60 mg Oral Patient Refused/Not Given 09/24/22 1331)  diclofenac Sodium (VOLTAREN) 1 % topical gel 4 g (4 g Topical Patient Refused/Not Given 09/24/22 2047)  enoxaparin (LOVENOX) injection 40 mg (40 mg Subcutaneous Given 09/24/22 1316)  albuterol (PROVENTIL) (2.5 MG/3ML) 0.083% nebulizer solution 2.5 mg (has no administration in time range)  metoCLOPramide (REGLAN) injection 5 mg (5 mg Intravenous Given 09/24/22 1858)  acetaminophen (OFIRMEV) IV 1,000 mg (1,000 mg Intravenous New Bag/Given 09/24/22 1901)  ketorolac (TORADOL) 30 MG/ML injection 30 mg (has no administration in time range)  ondansetron (ZOFRAN-ODT) disintegrating tablet 4 mg (4 mg Oral Given 09/24/22 0022)  lactated ringers bolus 2,000 mL (0 mLs Intravenous Stopped 09/24/22 0843)  pantoprazole (PROTONIX) injection 40 mg (40 mg Intravenous Given 09/24/22 0622)  famotidine (PEPCID) IVPB 20 mg premix (0 mg Intravenous Stopped 09/24/22 0703)  metoCLOPramide (REGLAN) injection 10 mg (10 mg Intravenous Given 09/24/22 0624)  ketorolac (TORADOL) injection 60 mg (60 mg Intramuscular Given 09/24/22 0649)  iohexol (OMNIPAQUE) 350 MG/ML injection 75 mL (75 mLs Intravenous Contrast Given 09/24/22 1111)  HYDROmorphone (DILAUDID) injection 0.5 mg (0.5 mg Intravenous Given 09/24/22 2154)    ED Course/ Medical Decision Making/ A&P Clinical Course as of 09/24/22 2156  Sat Sep 24, 2022  0629 Comment 3:        [JS]  1610 Family medicine [BM]    Clinical Course User Index [BM] Bill Salinas, PA-C [JS] Netta Corrigan, PA-C                           Medical  Decision Making Amount and/or Complexity of Data Reviewed Labs:  Decision-making details documented in ED Course. Radiology: ordered. ECG/medicine tests: ordered.  Risk Prescription drug management. Decision regarding hospitalization.   [facility] 46 y.o. presented today for 5d h/o N/V and abd pain. Working DDx that I considered at this time includes, but not limited to, pancreatitis, hyperemesis cannabinoid syndrome, alcoholic gastritis.  Review of prior external notes: 11/10/20 Discharge Summary  Unique Tests and Interpretation: Lipase: elevated to 141 CBC: leukocytosis 15.2 CMP: unremarkable I-Stat beta hCG: < 5 UA: unremarkable  Discussion with Independent Historian: none  Discussion of Management of Tests: none  Risk:   Medium:  - prescription drug management  Risk Stratification Score Ranson's Criteria: 0  Staffed with Antony Madura, PA-C and Drema Pry, MD  R/o DDx: Alcoholic Gastritis: considered however elevated lipase and abd pain radiating to the back point more to pancreatitis Hyperemesis Cannabinoid Syndrome: symptoms began before patient last used cannabis  Plan: Most likely diagnosis at this time is pancreatitis. Patient was given Reglan for the N/V, LR for fluid resus, Pepcid and protonix to cover for any acid reflux. Patient is 6 years clean from opioids and on suboxone and did not want any narcotics/opioids to treat her pain. We will respect her wishes and give Toradol IM to manage pain. We will reassess in 2 hours to see if patient can take fluids PO and whether she feels she can be discharged or needs to be admitted. Patient was signed out to oncoming team at 730.  Final Clinical Impression(s) / ED Diagnoses Final diagnoses:  Epigastric pain    Rx / DC Orders ED Discharge Orders     None         Remi Deter 09/24/22 2156

## 2022-09-24 NOTE — H&P (Signed)
Hospital Admission History and Physical Service Pager: [phone]  Patient name: Gail Blackburn Medical record number: [id number] Date of Birth: Apr 25, 1976 Age: 46 y.o. Gender: female  Primary Care Provider: Sharion Settler, DO Consultants: None Code Status: Full, confirmed by patient Preferred Emergency Contact:  Contact Information     Name Relation Home Work [place] Other [phone]        Chief Complaint: Nausea/vomiting/abdominal pain   Assessment and Plan: Gail Blackburn is a 46 y.o. female presenting with nausea, vomiting, and abdominal pain. Differential for this patient includes alcoholic pancreatitis, gallstone pancreatitis, viral GI illness, pregnancy. Most likely diagnosis is alcoholic pancreatitis. Patient has a history of pancreatitis and does have daily alcohol consumption. She does not have history of hyperlipidemia; however, she is within the demographic for gallstone pancreatitis and does have upper abdominal pain. Lipase was elevated making pancreatitis likely; however, not exactly over 3 times the upper limit of normal. Ranson's criteria is 0. Pregnancy and GI illness less likely as patient has severe abdominal pain and no fever though she does have leukocytosis. Pregnancy test was negative.   Abdominal pain Most likely due to pancreatitis as lipase is 141. AST/ALT 17/12 with alk phos wnl.  - Admit to FPTS, Attending Dr. Owens Shark  - Continue LR 125 ml/hr  - Reglan q6hrs prn, patient's nausea not relieved by zofran  - IV tylenol 1000 q6hrs scheduled, IV toradol q8hrs prn  - Patient would like to avoid opioid analgesics due to her h/o OUD  - F/u RUQ ultrasound  - CT Abdomen Pelvis given unclear lipase and patient does not fully meet pancreatitis criteria, h/o splenic vein thrombosis  - NPO with sips with meds  - PT eval and treat  Alcohol use disorder, moderate, dependence ([place]) Patient drinks 2-3 16-40 oz drinks a day. Last drink on 12/29 pm. Does have  history of withdrawal without seizures. Alcohol use has caused pancreatitis in the past.  - CIWA protocol  - Continue home xanax TID prn  - TOC consult placed  - Folic acid, thiamine, multivitamin supplementation    Chronic and Stable Conditions OUD: Continue suboxone 8-2 mg BID  GERD: continue propranolol 60 mg  Bipolar disorder: Continue wellbutrin 150 mg, effexor 75 mg daily, xanax TID prn Asthma: Continue home albuterol   FEN/GI: NPO VTE Prophylaxis: Lovenox  Disposition: med-tele  History of Present Illness:  Gail Blackburn is a 46 y.o. female presenting with abdominal pain, nausea and vomiting. She reports 3 days of abdominal pain, mid-epigastric radiating to R side and up back. Associated with nausea and multiple episodes of non-bloody, non-bilous emesis. Also endorses chills, intermittent dyspnea, and intermittent chest pain which she relates to anxiety. Has associated diarrhea X3 for past day. No new foods but reports she relapsed on alcohol 5 months ago. Previously drank heavily now drinking 2-3 5% or 10% beverages per day. Not drinking 'hard alcohol'. Uses cannabis once a day. Smokes 4-5 cigarettes a day. No new medications. No travel or sick contacts. Lives with her mother who is independent.   In the ED, the patient was noted to have a mildly elevated lipase at 141, leukocytosis to 15K, and negative HCG. RUQ ultrasound unremarkable. She was given Toradol, Zofran, Reglan, a 2 L fluid bolus, and pepcid without relief of her nausea, vomiting, and pain. Family medicine was called for admission due to intractable nausea, vomiting, and pain  Review Of Systems: Per HPI with the following additions: none--as above. Denies headaches, fevers, edema.  Pertinent Past Medical History: Migraines  OUD on suboxone  Alcohol use disorder, prior withdrawal w/o seizures Bipolar disorder Splenic vein thrombosis  (2022)  Remainder reviewed in history tab.   Pertinent Past Surgical  History: Colonoscopy Remainder reviewed in history tab.   Pertinent Social History: Tobacco use: Yes Alcohol use: Yes  Other Substance use: Cannabis once per day, last used opioids 8 years ago, would like to avoid is possible  Lives with mother   Pertinent Family History: Alcohol use disorder  Bipolar disorder in mother Heart disease in mother 2 aunts with gallbladder disease requiring removal   Remainder reviewed in history tab.   Important Outpatient Medications: Birth control Xanax 0.5 mg, takes 4 times a day - [facility]  Wellbutrin Effexor Probiotic  Propranolol ER Suboxone 8-2 Remainder reviewed in medication history.   Objective: BP (!) 142/82 (BP Location: Left Arm)   Pulse 61   Temp 98.8 F (37.1 C) (Oral)   Resp 18   Ht _0  (1.651 m)   Wt 55.3 kg   SpO2 97%   BMI 20.29 kg/m  Exam: General: Uncomfortable appearing, in no acute distress Eyes: no scleral icterus  ENTM: dry mouth, yellow on tongue surface, no jaundiced mucous membranes  Neck: Normal ROM Cardiovascular: RRR, radial pulses equal bilaterally  Respiratory: CTAB, normal work of breathing  Gastrointestinal: Soft, non distended, no caput medusa, tender to palpation in epigastric region, no tenderness to palpation in RUQ, slight hepatomegaly, could possibly feel tip of liver  Derm: No palmar erythema Neuro: A&O x 4, no asterixis  Psych: pleasant, in discomfort   Labs:  CBC BMET  Recent Labs  Lab 09/24/22 0007  WBC 15.2*  HGB 14.3  HCT 41.2  PLT 269   Recent Labs  Lab 09/24/22 0007  NA 136  K 3.5  CL 104  CO2 24  BUN 5*  CREATININE 0.76  GLUCOSE 117*  CALCIUM 9.3    Pertinent additional labs troponin 4, lipase 141, AST/ALT 17/12.   EKG: NSR, Qtc 448    Imaging Studies Performed: RUQ Ultrasound:  Unremarkable, no gallstones or pericholecystic fluid   CT Abdomen Pelvis W Contrast IMPRESSION: 1. Positive for Acute Pancreatitis. Moderate peripancreatic inflammation.  No pancreatic necrosis, splenic vein thrombosis, or other complicating feature. No organized or drainable fluid collection at this time. Trace associated free fluid in the pelvis.   2. No other acute or inflammatory process identified in the abdomen or pelvis.   Lowry Ram, MD 09/24/2022, 3:32 PM PGY-1, [place] Intern pager: [phone], text pages welcome Secure chat group [place]

## 2022-09-24 NOTE — Progress Notes (Signed)
FMTS Interim Progress Note  S: Night round on patient with Dr. Elliot Gurney. Patient states last time she vomited was 10 minutes prior to arrival. States that pain is now at level where she would consider opioids to manage her abdominal pain. Ex boyfriend also present in room, both him and patient very concerned about opioid withdrawal in the context of patient being on Suboxone. Denies blood in her vomit.  O: BP 130/79 (BP Location: Left Arm)   Pulse 79   Temp 98.8 F (37.1 C) (Oral)   Resp 18   Ht 5\' 5"  (1.651 m)   Wt 55.3 kg   SpO2 100%   BMI 20.29 kg/m   General: NAD, appears comfortable despite verbal expression of pain Cardiovascular: RRR, no murmurs, no peripheral edema Respiratory: normal WOB on RA, CTAB, no wheezes, ronchi or rales Abdomen: soft, tender to palpation over epigastric area, no rebound or guarding Extremities: Moving all 4 extremities equally   A/P: Abdominal pain Likely acute pancreatitis in the setting of alcohol use disorder. -Continue LR 167ml/hr -Continue Reglan q6hrs PRN -NPO until nausea/vomiting and pain improves -Dilaudid 0.5mg  IV once for acute pain -K-pad ordered -Continue Ketorlac 30mg  IV q6h prn -Continue Tylenol 1000mg  IV q6h  Alcohol use disorder Last CIWA 5. Improving. -Continue Xanax 0.5mg  TID prn -Continue CIWA protocol  Orders reviewed. Remainder of plan per day team.    32m, MD 09/24/2022, 9:37 PM PGY-1, [facility] Family Medicine Service pager [phone]

## 2022-09-24 NOTE — ED Provider Notes (Signed)
Care handoff received from Evlyn Kanner, PA-C at shift change please see previous providers note for full details of visit.  In short 46 year old female with history of pancreatitis, alcohol use disorder, prior opioid use disorder and anxiety presented for abdominal pain nausea and vomiting ongoing x 3 days.  She endorses alcohol use and drinking 2-316 ounce beers recently.  Pain is mostly in the epigastrium.  Emesis is nonbloody.  Labs were significant for the following: Lipase elevated at 141 Leukocytosis of 15.2 Pregnancy test negative CMP unremarkable UA unremarkable  Ranson's criteria noted to be 0.  Patient is thought to be experiencing pancreatitis at this time she was given Reglan, IV fluids, Protonix Zofran and Toradol.  I have been asked to reassess this patient later on today and p.o. challenge.  If patient is able to tolerate p.o. and feels improved plan is to discharge to home.  If not improved admit for pancreatitis.  Physical Exam  BP 126/88   Pulse 80   Temp 97.7 F (36.5 C)   Resp 18   Ht 5\' 5"  (1.651 m)   Wt 55.3 kg   SpO2 99%   BMI 20.29 kg/m   Physical Exam Constitutional:      General: She is not in acute distress.    Appearance: Normal appearance. She is well-developed. She is not ill-appearing or diaphoretic.  HENT:     Head: Normocephalic and atraumatic.  Eyes:     General: Vision grossly intact. Gaze aligned appropriately.     Pupils: Pupils are equal, round, and reactive to light.  Neck:     Trachea: Trachea and phonation normal.  Pulmonary:     Effort: Pulmonary effort is normal. No respiratory distress.  Abdominal:     General: There is no distension.     Palpations: Abdomen is soft.     Tenderness: There is abdominal tenderness in the right upper quadrant, epigastric area and left upper quadrant. There is no guarding or rebound.  Musculoskeletal:        General: Normal range of motion.     Cervical back: Normal range of motion.  Skin:     General: Skin is warm and dry.  Neurological:     Mental Status: She is alert.     GCS: GCS eye subscore is 4. GCS verbal subscore is 5. GCS motor subscore is 6.     Comments: Speech is clear and goal oriented, follows commands Major Cranial nerves without deficit, no facial droop Moves extremities without ataxia, coordination intact  Psychiatric:        Behavior: Behavior normal.     Procedures  Procedures  ED Course / MDM   Clinical Course as of 09/24/22 0857  Sat Sep 24, 2022  0629 Comment 3:        [JS]  0630 Family medicine [BM]    Clinical Course User Index [BM] 4259, PA-C [JS] Bill Salinas   Medical Decision Making Care handoff received from Remi Deter, PA-C at shift change please see previous providers note for full details of visit.  In short 46 year old female with history of pancreatitis, alcohol use disorder, prior opioid use disorder and anxiety presented for abdominal pain nausea and vomiting ongoing x 3 days.  She endorses alcohol use and drinking 2-316 ounce beers recently.  Pain is mostly in the epigastrium.  Emesis is nonbloody.  Labs were significant for the following: Lipase elevated at 141 Leukocytosis of 15.2 Pregnancy test negative CMP unremarkable UA  unremarkable  Ranson's criteria noted to be 0.  Patient is thought to be experiencing pancreatitis at this time she was given Reglan, IV fluids, Protonix Zofran and Toradol.  I have been asked to reassess this patient later on today and p.o. challenge.  If patient is able to tolerate p.o. and feels improved plan is to discharge to home.  If not improved admit for pancreatitis. =======================================   7:30 AM: Patient reassessed she is resting in bed no acute distress reports continued abdominal pain and nausea no relief with medication thus far.  Will monitor and recheck.   8:30 AM: Patient reports no improvement in her pain.  She has received Toradol  Reglan Protonix Zofran and IV fluids.  She remains tender in the epigastrium.  I discussed potential observation admission versus discharge.  Shared decision making made with patient.  She does not feel well enough to go home at this point will reach out to family medicine for possible admission.  Amount and/or Complexity of Data Reviewed External Data Reviewed: notes.    Details: Prior ER encounters Labs:  Decision-making details documented in ED Course.    Details: CBC, CMP, lipase, urinalysis, urine pregnancy Radiology: ordered. ECG/medicine tests: ordered. Discussion of management or test interpretation with external provider(s): Consulted with family medicine team.  We have added right upper quadrant ultrasound to rule out cholecystitis.  Family medicine plans admit patient for further management.  Risk Prescription drug management. Decision regarding hospitalization.   Note: Portions of this report may have been transcribed using voice recognition software. Every effort was made to ensure accuracy; however, inadvertent computerized transcription errors may still be present.        Bill Salinas, PA-C 09/24/22 1610    Wynetta Fines, MD 09/24/22 [phone]

## 2022-09-24 NOTE — Assessment & Plan Note (Addendum)
Patient presents with abdominal pain concerning for pancreatitis as seen with elevated lipase of 141 and CT abdomen.  Overnight had worsening abdominal pain requiring 0.5 mg Dilaudid.  Patient reports ** Nausea and vomiting improved with decrease in pain after dilaudid.  The need for Reglan for nausea, we will obtain EKG to monitor QTc. - IV tylenol 1000 q6hrs scheduled, IV toradol q6hrs prn - Per pt request will try to avoid opioid as possible given hx of OUD - Continue LR 125 ml/hr  - Reglan q6hrs prn, patient's nausea not relieved by zofran  - Follow-up EKG to monitor QTc - NPO with sips with meds - Plan to advance diet when appropriate

## 2022-09-24 NOTE — ED Provider Triage Note (Signed)
Emergency Medicine Provider Triage Evaluation Note  Gail Blackburn , a 46 y.o. female  was evaluated in triage.  Pt complains of upper abd pain, vomiting starting 2 to 3 days ago.  Patient does not have a history of abdominal surgeries but has had pancreatitis due to alcohol.  She states that she relapsed drinking alcohol about 6 months ago, but per report has not been drinking "heavily".  Last alcohol intake was yesterday.  Last episode of vomiting 15 minutes prior to arrival.  She has been having diarrhea as well.  Pain radiates to the back and reminds her of previous pancreatitis episode.  Denies history of gallstones, diabetes, cholesterol issues.  Review of Systems  Positive: Abdominal pain, back pain, vomiting Negative: Fever  Physical Exam  BP (!) 124/98   Pulse 78   Resp 15   SpO2 97%  Gen:   Awake, no distress   Resp:  Normal effort  MSK:   Moves extremities without difficulty  Other:  Epigastric and right upper quadrant tenderness to palpation  Medical Decision Making  Medically screening exam initiated at 12:01 AM.  Appropriate orders placed.  Simrit Gohlke was informed that the remainder of the evaluation will be completed by another provider, this initial triage assessment does not replace that evaluation, and the importance of remaining in the ED until their evaluation is complete.    Renne Crigler, PA-C 09/24/22 0003

## 2022-09-24 NOTE — ED Notes (Signed)
Pt to CT, then upstairs to 6N18

## 2022-09-24 NOTE — Assessment & Plan Note (Deleted)
Most likely due to pancreatitis as lipase is 141. AST/ALT 17/12 with alk phos wnl.  - Admit to FPTS, Attending Dr. Owens Shark  - Continue LR 125 ml/hr  - Reglan q6hrs prn, patient's nausea not relieved by zofran  - IV tylenol 1000 q6hrs scheduled, IV toradol q8hrs prn  - Patient would like to avoid opioid analgesics due to her h/o OUD  - F/u RUQ ultrasound  - CT Abdomen Pelvis given unclear lipase and patient does not fully meet pancreatitis criteria, h/o splenic vein thrombosis  - NPO with sips with meds  - PT eval and treat

## 2022-09-25 DIAGNOSIS — G43909 Migraine, unspecified, not intractable, without status migrainosus: Secondary | ICD-10-CM | POA: Diagnosis not present

## 2022-09-25 DIAGNOSIS — Z20822 Contact with and (suspected) exposure to covid-19: Secondary | ICD-10-CM | POA: Diagnosis not present

## 2022-09-25 DIAGNOSIS — J45909 Unspecified asthma, uncomplicated: Secondary | ICD-10-CM | POA: Diagnosis not present

## 2022-09-25 DIAGNOSIS — E876 Hypokalemia: Secondary | ICD-10-CM

## 2022-09-25 DIAGNOSIS — K852 Alcohol induced acute pancreatitis without necrosis or infection: Secondary | ICD-10-CM

## 2022-09-25 DIAGNOSIS — R748 Abnormal levels of other serum enzymes: Secondary | ICD-10-CM | POA: Diagnosis not present

## 2022-09-25 DIAGNOSIS — Z86718 Personal history of other venous thrombosis and embolism: Secondary | ICD-10-CM | POA: Diagnosis not present

## 2022-09-25 DIAGNOSIS — Z793 Long term (current) use of hormonal contraceptives: Secondary | ICD-10-CM | POA: Diagnosis not present

## 2022-09-25 DIAGNOSIS — F17210 Nicotine dependence, cigarettes, uncomplicated: Secondary | ICD-10-CM | POA: Diagnosis not present

## 2022-09-25 DIAGNOSIS — F1110 Opioid abuse, uncomplicated: Secondary | ICD-10-CM | POA: Diagnosis present

## 2022-09-25 DIAGNOSIS — F419 Anxiety disorder, unspecified: Secondary | ICD-10-CM | POA: Diagnosis not present

## 2022-09-25 DIAGNOSIS — Z79891 Long term (current) use of opiate analgesic: Secondary | ICD-10-CM | POA: Diagnosis not present

## 2022-09-25 DIAGNOSIS — F319 Bipolar disorder, unspecified: Secondary | ICD-10-CM | POA: Diagnosis not present

## 2022-09-25 DIAGNOSIS — F1020 Alcohol dependence, uncomplicated: Secondary | ICD-10-CM | POA: Diagnosis present

## 2022-09-25 DIAGNOSIS — K219 Gastro-esophageal reflux disease without esophagitis: Secondary | ICD-10-CM | POA: Diagnosis not present

## 2022-09-25 DIAGNOSIS — Z79899 Other long term (current) drug therapy: Secondary | ICD-10-CM | POA: Diagnosis not present

## 2022-09-25 DIAGNOSIS — Z9109 Other allergy status, other than to drugs and biological substances: Secondary | ICD-10-CM | POA: Diagnosis not present

## 2022-09-25 DIAGNOSIS — Z91040 Latex allergy status: Secondary | ICD-10-CM | POA: Diagnosis not present

## 2022-09-25 DIAGNOSIS — R1013 Epigastric pain: Secondary | ICD-10-CM | POA: Diagnosis not present

## 2022-09-25 DIAGNOSIS — R197 Diarrhea, unspecified: Secondary | ICD-10-CM | POA: Diagnosis not present

## 2022-09-25 LAB — BASIC METABOLIC PANEL
Anion gap: 10 (ref 5–15)
Anion gap: 8 (ref 5–15)
BUN: <5 mg/dL — ABNORMAL LOW (ref 6–20)
BUN: <5 mg/dL — ABNORMAL LOW (ref 6–20)
CO2: 23 mmol/L (ref 22–32)
CO2: 23 mmol/L (ref 22–32)
Calcium: 8.4 mg/dL — ABNORMAL LOW (ref 8.9–10.3)
Calcium: 8.4 mg/dL — ABNORMAL LOW (ref 8.9–10.3)
Chloride: 102 mmol/L (ref 98–111)
Chloride: 104 mmol/L (ref 98–111)
Creatinine, Ser: 0.66 mg/dL (ref 0.44–1.00)
Creatinine, Ser: 0.67 mg/dL (ref 0.44–1.00)
GFR, Estimated: >60 mL/min (ref >60)
GFR, Estimated: >60 mL/min (ref >60)
Glucose, Bld: 110 mg/dL — ABNORMAL HIGH (ref 70–99)
Glucose, Bld: 97 mg/dL (ref 70–99)
Potassium: 2.8 mmol/L — ABNORMAL LOW (ref 3.5–5.1)
Potassium: 3.4 mmol/L — ABNORMAL LOW (ref 3.5–5.1)
Sodium: 135 mmol/L (ref 135–145)
Sodium: 135 mmol/L (ref 135–145)

## 2022-09-25 LAB — CBC
HCT: 31.3 % — ABNORMAL LOW (ref 36.0–46.0)
Hemoglobin: 11.2 g/dL — ABNORMAL LOW (ref 12.0–15.0)
MCH: 34.1 pg — ABNORMAL HIGH (ref 26.0–34.0)
MCHC: 35.8 g/dL (ref 30.0–36.0)
MCV: 95.4 fL (ref 80.0–100.0)
Platelets: 180 10*3/uL (ref 150–400)
RBC: 3.28 MIL/uL — ABNORMAL LOW (ref 3.87–5.11)
RDW: 13.7 % (ref 11.5–15.5)
WBC: 11.9 10*3/uL — ABNORMAL HIGH (ref 4.0–10.5)
nRBC: 0 % (ref 0.0–0.2)

## 2022-09-25 LAB — MAGNESIUM: Magnesium: 1.7 mg/dL (ref 1.7–2.4)

## 2022-09-25 MED ORDER — METOCLOPRAMIDE HCL 5 MG/ML IJ SOLN
5.0000 mg | Freq: Four times a day (QID) | INTRAMUSCULAR | Status: DC
  Administered 2022-09-25 – 2022-09-27 (×8): 5 mg via INTRAVENOUS
  Filled 2022-09-25 (×9): qty 2

## 2022-09-25 MED ORDER — POTASSIUM CHLORIDE 10 MEQ/100ML IV SOLN
10.0000 meq | INTRAVENOUS | Status: AC
  Administered 2022-09-25 (×6): 10 meq via INTRAVENOUS
  Filled 2022-09-25 (×5): qty 100

## 2022-09-25 MED ORDER — SENNA 8.6 MG PO TABS
1.0000 | ORAL_TABLET | Freq: Every day | ORAL | Status: DC
  Administered 2022-09-25 – 2022-09-29 (×5): 8.6 mg via ORAL
  Filled 2022-09-25 (×5): qty 1

## 2022-09-25 MED ORDER — ACETAMINOPHEN 325 MG PO TABS
650.0000 mg | ORAL_TABLET | Freq: Four times a day (QID) | ORAL | Status: DC
  Administered 2022-09-25 – 2022-09-27 (×7): 650 mg via ORAL
  Filled 2022-09-25 (×8): qty 2

## 2022-09-25 MED ORDER — METOCLOPRAMIDE HCL 5 MG/ML IJ SOLN
5.0000 mg | Freq: Once | INTRAMUSCULAR | Status: AC
  Administered 2022-09-25: 5 mg via INTRAVENOUS

## 2022-09-25 MED ORDER — HYDROMORPHONE HCL 1 MG/ML IJ SOLN
0.5000 mg | INTRAMUSCULAR | Status: DC | PRN
  Administered 2022-09-25 – 2022-09-26 (×9): 0.5 mg via INTRAVENOUS
  Filled 2022-09-25 (×9): qty 0.5

## 2022-09-25 MED ORDER — POTASSIUM CHLORIDE 10 MEQ/100ML IV SOLN
10.0000 meq | INTRAVENOUS | Status: AC
  Administered 2022-09-25 – 2022-09-26 (×4): 10 meq via INTRAVENOUS
  Filled 2022-09-25 (×4): qty 100

## 2022-09-25 MED ORDER — MAGNESIUM SULFATE IN D5W 1-5 GM/100ML-% IV SOLN
1.0000 g | Freq: Once | INTRAVENOUS | Status: AC
  Administered 2022-09-25: 1 g via INTRAVENOUS
  Filled 2022-09-25: qty 100

## 2022-09-26 DIAGNOSIS — K852 Alcohol induced acute pancreatitis without necrosis or infection: Secondary | ICD-10-CM | POA: Diagnosis not present

## 2022-09-26 DIAGNOSIS — R1013 Epigastric pain: Secondary | ICD-10-CM | POA: Diagnosis not present

## 2022-09-26 DIAGNOSIS — E876 Hypokalemia: Secondary | ICD-10-CM | POA: Diagnosis not present

## 2022-09-26 LAB — CBC WITH DIFFERENTIAL/PLATELET
Abs Immature Granulocytes: 0.06 10*3/uL (ref 0.00–0.07)
Basophils Absolute: 0 10*3/uL (ref 0.0–0.1)
Basophils Relative: 0 %
Eosinophils Absolute: 0.3 10*3/uL (ref 0.0–0.5)
Eosinophils Relative: 4 %
HCT: 29.1 % — ABNORMAL LOW (ref 36.0–46.0)
Hemoglobin: 10.1 g/dL — ABNORMAL LOW (ref 12.0–15.0)
Immature Granulocytes: 1 %
Lymphocytes Relative: 21 %
Lymphs Abs: 1.8 10*3/uL (ref 0.7–4.0)
MCH: 34.6 pg — ABNORMAL HIGH (ref 26.0–34.0)
MCHC: 34.7 g/dL (ref 30.0–36.0)
MCV: 99.7 fL (ref 80.0–100.0)
Monocytes Absolute: 0.8 10*3/uL (ref 0.1–1.0)
Monocytes Relative: 9 %
Neutro Abs: 5.7 10*3/uL (ref 1.7–7.7)
Neutrophils Relative %: 65 %
Platelets: 157 10*3/uL (ref 150–400)
RBC: 2.92 MIL/uL — ABNORMAL LOW (ref 3.87–5.11)
RDW: 14.3 % (ref 11.5–15.5)
WBC: 8.7 10*3/uL (ref 4.0–10.5)
nRBC: 0 % (ref 0.0–0.2)

## 2022-09-26 LAB — BASIC METABOLIC PANEL
Anion gap: 10 (ref 5–15)
BUN: <5 mg/dL — ABNORMAL LOW (ref 6–20)
CO2: 23 mmol/L (ref 22–32)
Calcium: 8.2 mg/dL — ABNORMAL LOW (ref 8.9–10.3)
Chloride: 103 mmol/L (ref 98–111)
Creatinine, Ser: 0.61 mg/dL (ref 0.44–1.00)
GFR, Estimated: >60 mL/min (ref >60)
Glucose, Bld: 84 mg/dL (ref 70–99)
Potassium: 3.4 mmol/L — ABNORMAL LOW (ref 3.5–5.1)
Sodium: 136 mmol/L (ref 135–145)

## 2022-09-26 MED ORDER — KETOROLAC TROMETHAMINE 30 MG/ML IJ SOLN
30.0000 mg | Freq: Three times a day (TID) | INTRAMUSCULAR | Status: AC | PRN
  Administered 2022-09-26 (×3): 30 mg via INTRAVENOUS
  Filled 2022-09-26 (×4): qty 1

## 2022-09-26 MED ORDER — HYDROMORPHONE HCL 1 MG/ML IJ SOLN
0.7500 mg | INTRAMUSCULAR | Status: DC | PRN
  Administered 2022-09-26 – 2022-09-27 (×6): 0.75 mg via INTRAVENOUS
  Filled 2022-09-26 (×7): qty 1

## 2022-09-26 MED ORDER — POTASSIUM CHLORIDE 10 MEQ/100ML IV SOLN
INTRAVENOUS | Status: AC
  Administered 2022-09-26: 10 meq
  Filled 2022-09-26: qty 100

## 2022-09-26 MED ORDER — POTASSIUM CHLORIDE 10 MEQ/100ML IV SOLN
10.0000 meq | INTRAVENOUS | Status: AC
  Administered 2022-09-26 (×4): 10 meq via INTRAVENOUS
  Filled 2022-09-26 (×3): qty 100

## 2022-09-26 NOTE — Assessment & Plan Note (Signed)
K+ = 3.4 today.  -Replete with K-dur 40 mEq -BMP, Mg in AM

## 2022-09-26 NOTE — Progress Notes (Addendum)
Daily Progress Note Intern Pager: [phone]  Patient name: Gail Blackburn Medical record number: [id number] Date of birth: 05-30-1976 Age: 47 y.o. Gender: female  Primary Care Provider: Sharion Settler, DO Consultants: None Code Status: FULL  Pt Overview and Major Events to Date:  12/30: Admitted   Assessment and Plan: Annalaya Wile is a 47 year old female with past medical history of alcohol use disorder, OUD on Suboxone, bipolar disorder, and severe migraines admitted for abdominal pain. Found to elevated lipase and CT findings consistent with pancreatitis.   Acute pancreatitis Started on Diladid yesterday, holding home Suboxone- restart when appropriate. Still having pain with 0.5 mg dilaudid q3h + scheduled tylenol, and does not have PRN Toradol on board (only received 2/3 days)- reports this helped. Patient also requesting to start clear liquid diet-as she feels her nausea is well-controlled. - IV tylenol 1000 q6hrs scheduled, IV toradol q8hrs prn (1 day) - PRN 0.5 Dilaudid for breakthrough pain. -Please contact primary team if patient needs pain control after Dilaudid/Tordal dosing, consider additional one-time dose of 0.5 mg - Per pt request will try to avoid opioid as possible given hx of OUD - Continue LR 125 ml/hr  - Reglan q6hrs prn, patient's nausea not relieved by zofran  - Follow-up EKG to monitor QTc - Clear liquid diet - Advance diet as appropriate  Alcohol use disorder, moderate, dependence (HCC) History of alcohol use disorder with reported last drink on 12/29 pm.  CIWA scores overnight was 3 mostly for N/V - Continue CIWA protocol  - Continue home xanax TID prn  - TOC consult placed  - continue Folic acid, thiamine, multivitamin supplementation   Hypokalemia K+ = 3.4 today.  -Replete with IV 10 meq x4   Chronic medical conditions OUD: Restart Suboxone 8-2 mg BID once appropriate. Migraine: Continue propranolol 60 mg Bipolar disorder: Continue  wellbutrin 150 mg, effexor 75 mg daily, xanax TID prn Asthma: Continue home albuterol  FEN/GI: NPO PPx: Lovenox Dispo: Home in 1-2 days pending clinical improvement.  Subjective:  Patient feels she is ready to try clear liquid diet with broth. Additionally, she feels her pain is not well-controlled with the dilaudid, and inquired about increasing dose in addition to restarting Toradol.  Objective: Temp:  [97.8 F (36.6 C)-98.6 F (37 C)] 97.8 F (36.6 C) (01/01 0509) Pulse Rate:  [54-65] 54 (01/01 0509) Resp:  [16-17] 17 (01/01 0509) BP: (110-129)/(74-86) 117/79 (01/01 0509) SpO2:  [100 %] 100 % (01/01 0509) Physical Exam: General: NAD, pleasant Cardiovascular: RRR, no mrg Respiratory: CTAB, normal wob on RA Abdomen: Diffuse tenderness to palpation, soft, not distended. BS present.  Extremities: Warm and dry  Laboratory: Most recent CBC Lab Results  Component Value Date   WBC 8.7 09/26/2022   HGB 10.1 (L) 09/26/2022   HCT 29.1 (L) 09/26/2022   MCV 99.7 09/26/2022   PLT 157 09/26/2022   Most recent BMP    Latest Ref Rng & Units 09/26/2022    2:17 AM  BMP  Glucose 70 - 99 mg/dL 84   BUN 6 - 20 mg/dL <5   Creatinine 0.44 - 1.00 mg/dL 0.61   Sodium 135 - 145 mmol/L 136   Potassium 3.5 - 5.1 mmol/L 3.4   Chloride 98 - 111 mmol/L 103   CO2 22 - 32 mmol/L 23   Calcium 8.9 - 10.3 mg/dL 8.2    Leslie Dales, DO 09/26/2022, 6:11 AM  PGY-1, [place] Intern pager: [phone], text pages welcome Secure chat  group [facility] Teaching Service

## 2022-09-26 NOTE — Social Work (Signed)
CSW acknowledges consult for SU resources and counseling. CSW met with pt at bedside and she was aware of why CSW was there. She noted she had previously been sober 14 months, but unfortunately had a set back. Pt states she is working with SU therapists, AA, and has a plan to stay sober. She lives with her mother who has been sober 26 years and has supportive children. She declines any additional needs at this time.

## 2022-09-26 NOTE — Plan of Care (Signed)
  Problem: Education: Goal: Knowledge of General Education information will improve Description Including pain rating scale, medication(s)/side effects and non-pharmacologic comfort measures Outcome: Progressing   Problem: Health Behavior/Discharge Planning: Goal: Ability to manage health-related needs will improve Outcome: Progressing   

## 2022-09-27 DIAGNOSIS — E876 Hypokalemia: Secondary | ICD-10-CM | POA: Diagnosis not present

## 2022-09-27 DIAGNOSIS — K852 Alcohol induced acute pancreatitis without necrosis or infection: Secondary | ICD-10-CM | POA: Diagnosis not present

## 2022-09-27 DIAGNOSIS — R1013 Epigastric pain: Secondary | ICD-10-CM | POA: Diagnosis not present

## 2022-09-27 DIAGNOSIS — F1020 Alcohol dependence, uncomplicated: Secondary | ICD-10-CM | POA: Diagnosis not present

## 2022-09-27 MED ORDER — ACETAMINOPHEN 500 MG PO TABS
1000.0000 mg | ORAL_TABLET | Freq: Four times a day (QID) | ORAL | Status: DC
  Administered 2022-09-27 – 2022-09-29 (×7): 1000 mg via ORAL
  Filled 2022-09-27 (×7): qty 2

## 2022-09-27 MED ORDER — POTASSIUM CHLORIDE CRYS ER 20 MEQ PO TBCR
40.0000 meq | EXTENDED_RELEASE_TABLET | Freq: Once | ORAL | Status: AC
  Administered 2022-09-27: 40 meq via ORAL
  Filled 2022-09-27: qty 2

## 2022-09-27 MED ORDER — POLYETHYLENE GLYCOL 3350 17 G PO PACK
17.0000 g | PACK | Freq: Every day | ORAL | Status: DC
  Administered 2022-09-27 – 2022-09-29 (×3): 17 g via ORAL
  Filled 2022-09-27 (×3): qty 1

## 2022-09-27 MED ORDER — OXYCODONE HCL 5 MG PO TABS
5.0000 mg | ORAL_TABLET | ORAL | Status: DC | PRN
  Administered 2022-09-27 – 2022-09-29 (×9): 5 mg via ORAL
  Filled 2022-09-27 (×10): qty 1

## 2022-09-27 MED ORDER — METOCLOPRAMIDE HCL 5 MG/ML IJ SOLN
5.0000 mg | Freq: Four times a day (QID) | INTRAMUSCULAR | Status: DC | PRN
  Administered 2022-09-27 – 2022-09-28 (×4): 5 mg via INTRAVENOUS
  Filled 2022-09-27 (×6): qty 2

## 2022-09-27 NOTE — Progress Notes (Addendum)
FMTS Attending Daily Note:  Gail Netters MD Personal pager:  [phone] FPTS Service Pager:  [phone]  I have seen and examined this patient and have reviewed their chart. I have discussed this patient with the resident. I agree with the resident's findings, assessment and care plan.  Additionally:  Change to PO oxycodone 5mg  q4h as needed Tolerating some more PO today, still with pain   Gail Rio, MD 09/27/2022   ------------------------------------------      Daily Progress Note Intern Pager: [phone]  Patient name: Gail Blackburn Medical record number: [id number] Date of birth: Jun 07, 1976 Age: 47 y.o. Gender: female  Primary Care Provider: Sharion Settler, DO Consultants: None Code Status: FULL CODE  Pt Overview and Major Events to Date:  12/30: Admitted 1/1: Started on clear diet 1/2: Diet liberalized   Assessment and Plan: Gail Blackburn is a 47 y.o. female admitted for alcoholic pancreatitis. PMHx includes OUD on Suboxone (currently held due to full agonist therapy), bipolar disorder, severe migraines.   Acute pancreatitis Dilaudid dose increased to 0.75 mg from 0.5 mg on 1/1. Did require 5 doses of 0.5 mg and two doses of 0.75 mg 1/1. Holding home Suboxone given acute pain, plan to restart when pain is more controlled. Started on clears yesterday, tolerating without emesis. - Tylenol 1000 q6hrs scheduled - Transition from Dilaudid to Oxy IR 5 mg q4h PRN  - Decrease IVF given improved PO intake to LR 75 ml/hr  - Reglan q6hrs PRN - ADAT to regular diet  - Bowel regimen: Miralax daily, senna daily   Alcohol use disorder, moderate, dependence (HCC) History of alcohol use disorder with reported last drink on 12/29 pm.  CIWA scores 2>1. Patient feels confident and well supported in plan to maintain sobriety at home.  - 4 days since last drink and given stable CIWA, will d/c  - Continue home xanax TID prn  - Appreciate TOC consult - continue Folic acid,  thiamine, multivitamin supplementation   Hypokalemia K+ = 3.4 today.  -Replete with K-dur 40 mEq -BMP, Mg in AM    Chronic and Stable Conditions OUD: Plan to re-initiate suboxone 8-2 mg BID once pain is better controlled, possibly 1/3 Migraine: continue propranolol 60 mg  Bipolar disorder: Continue wellbutrin 150 mg, effexor 75 mg daily, xanax TID prn Asthma: Continue home albuterol  FEN/GI: ADAT  PPx: Lovenox Dispo:Home pending clinical improvement . Barriers include pain control, PO intake.   Subjective:  Patient continues to have pain necessitating IV PRN medications, does not feel ready to transition back to suboxone. She is tolerating PO and though she continues to have intermittent nausea, she has not vomited. She is urinating frequently. Has not had a BM since 12/29.  Objective: Temp:  [97.4 F (36.3 C)-98.1 F (36.7 C)] 97.9 F (36.6 C) (01/02 0833) Pulse Rate:  [51-77] 51 (01/02 0833) Resp:  [17-18] 18 (01/02 0833) BP: (109-133)/(69-82) 129/82 (01/02 0833) SpO2:  [98 %-100 %] 100 % (01/02 0623) Physical Exam: General: NAD, pleasant, cooperative with examination Cardiovascular: RRR without murmur Respiratory: CTAB without wheezing/rhonchi/rales Abdomen: Soft, non-distended, normoactive bowel sounds. Tenderness to LLQ, suprapubic, epigastric, and LUQ without R/G Extremities: No edema   Laboratory: Most recent CBC Lab Results  Component Value Date   WBC 8.7 09/26/2022   HGB 10.1 (L) 09/26/2022   HCT 29.1 (L) 09/26/2022   MCV 99.7 09/26/2022   PLT 157 09/26/2022   Most recent BMP    Latest Ref Rng & Units 09/26/2022    2:17 AM  BMP  Glucose 70 - 99 mg/dL 84   BUN 6 - 20 mg/dL <5   Creatinine 0.44 - 1.00 mg/dL 0.61   Sodium 135 - 145 mmol/L 136   Potassium 3.5 - 5.1 mmol/L 3.4   Chloride 98 - 111 mmol/L 103   CO2 22 - 32 mmol/L 23   Calcium 8.9 - 10.3 mg/dL 8.2      Imaging/Diagnostic Tests: No new imaging.   Gail Settler, DO 09/27/2022, 1:58  PM  PGY-3, [place] Intern pager: [phone], text pages welcome Secure chat group [place]

## 2022-09-27 NOTE — Plan of Care (Signed)
  Problem: Education: Goal: Knowledge of General Education information will improve Description: Including pain rating scale, medication(s)/side effects and non-pharmacologic comfort measures Outcome: Progressing   Problem: Health Behavior/Discharge Planning: Goal: Ability to manage health-related needs will improve Outcome: Progressing   Problem: Clinical Measurements: Goal: Ability to maintain clinical measurements within normal limits will improve Outcome: Progressing Goal: Will remain free from infection Outcome: Progressing Goal: Diagnostic test results will improve Outcome: Progressing Goal: Respiratory complications will improve Outcome: Progressing Goal: Cardiovascular complication will be avoided Outcome: Progressing   Problem: Activity: Goal: Risk for activity intolerance will decrease Outcome: Completed/Met   Problem: Nutrition: Goal: Adequate nutrition will be maintained Outcome: Not Progressing   Problem: Coping: Goal: Level of anxiety will decrease Outcome: Progressing   Problem: Elimination: Goal: Will not experience complications related to bowel motility Outcome: Progressing Goal: Will not experience complications related to urinary retention Outcome: Progressing   Problem: Pain Managment: Goal: General experience of comfort will improve Outcome: Progressing   Problem: Safety: Goal: Ability to remain free from injury will improve Outcome: Progressing   Problem: Skin Integrity: Goal: Risk for impaired skin integrity will decrease Outcome: Completed/Met

## 2022-09-28 DIAGNOSIS — R1013 Epigastric pain: Secondary | ICD-10-CM | POA: Diagnosis not present

## 2022-09-28 LAB — BASIC METABOLIC PANEL
Anion gap: 12 (ref 5–15)
BUN: <5 mg/dL — ABNORMAL LOW (ref 6–20)
CO2: 24 mmol/L (ref 22–32)
Calcium: 8.5 mg/dL — ABNORMAL LOW (ref 8.9–10.3)
Chloride: 102 mmol/L (ref 98–111)
Creatinine, Ser: 0.62 mg/dL (ref 0.44–1.00)
GFR, Estimated: >60 mL/min (ref >60)
Glucose, Bld: 83 mg/dL (ref 70–99)
Potassium: 3.7 mmol/L (ref 3.5–5.1)
Sodium: 138 mmol/L (ref 135–145)

## 2022-09-28 LAB — MAGNESIUM: Magnesium: 1.9 mg/dL (ref 1.7–2.4)

## 2022-09-28 MED ORDER — KETOROLAC TROMETHAMINE 15 MG/ML IJ SOLN
15.0000 mg | Freq: Four times a day (QID) | INTRAMUSCULAR | Status: DC
  Administered 2022-09-28 – 2022-09-29 (×4): 15 mg via INTRAVENOUS
  Filled 2022-09-28 (×4): qty 1

## 2022-09-28 NOTE — Plan of Care (Signed)

## 2022-09-28 NOTE — Plan of Care (Signed)

## 2022-09-28 NOTE — Plan of Care (Signed)

## 2022-09-28 NOTE — Progress Notes (Signed)
Daily Progress Note Intern Pager: [phone]  Patient name: Gail Blackburn Medical record number: [id number] Date of birth: [birth year] Age: 47 y.o. Gender: female  Primary Care Provider: Sharion Settler, DO Consultants: None  Code Status: Full   Pt Overview and Major Events to Date:  12/30: Admitted 1/1: Started on clear diet 1/2: Diet liberalized -> some pain with po and went back to clears  Assessment and Plan: Gail Blackburn is a 47 y.o. female admitted for alcoholic pancreatitis. PMHx includes OUD on Suboxone (currently held due to full agonist therapy), bipolar disorder, severe migraines.    Acute pancreatitis Dilaudid dose increased to 0.75 mg from 0.5 mg on 1/1. Did require 5 doses of 0.5 mg and two doses of 0.75 mg 1/1. Holding home Suboxone given acute pain, plan to restart when pain is more controlled. Started on clears yesterday, tolerating without emesis. - Tylenol 1000 q6hrs scheduled - Oxy IR 5 mg q4h PRN  - Toradol 15mg  q6h  - D/c IVF given up almost 8 L and tolerating po better  - Reglan q6hrs PRN - ADAT to regular diet  - Bowel regimen: Miralax daily, senna daily   Alcohol use disorder, moderate, dependence ([place]) History of alcohol use disorder with reported last drink on 12/29 pm.  CIWA scores 2>1. Patient feels confident and well supported in plan to maintain sobriety at home.  - 4 days since last drink and given stable CIWA, will d/c  - Continue home xanax TID prn  - Appreciate TOC consult - continue Folic acid, thiamine, multivitamin supplementation   Hypokalemia K+ = 3.4 today.  -Replete with K-dur 40 mEq -BMP, Mg in AM    Chronic and Stable Conditions OUD: Plan to re-initiate suboxone 8-2 mg BID likely at discharge as she is still being managed with opioids  Migraine: continue propranolol 60 mg  Bipolar disorder: Continue wellbutrin 150 mg, effexor 75 mg daily, xanax TID prn Asthma: Continue home albuterol   FEN/GI: ADAT PPx:  Lovenox Dispo:Home pending clinical improvement . Barriers include pain control, PO intake.   Subjective:  No acute events overnight. Daughter bedside. Patient endorses pain ok for now but she just got her pain medication. She says it hasn't been lasting long enough. Declines starting back her suboxone given her pain. She states her diet was advanced yesterday but she felt sick when she tried to eat regular foot so went back to just liquids. Discusses her motivation to stop drinking alcohol and states that daughter isnt going to talk to her if she continues. They deny any questions or concerns.   Objective: Temp:  [97.8 F (36.6 C)-98 F (36.7 C)] 97.8 F (36.6 C) (01/03 0837) Pulse Rate:  [51-62] 62 (01/03 0837) Resp:  [17-18] 17 (01/03 0837) BP: (106-149)/(69-89) 138/89 (01/03 0837) SpO2:  [99 %-100 %] 100 % (01/03 0837) Physical Exam: General: alert, sitting in bed, NAD Cardiovascular: RRR no murmurs  Respiratory: CTAB normal WOB  Abdomen: soft, non distended. Normoactive BS. Tender to palpation diffusely without guarding or rebound tenderness  Extremities: warm, dry. No edema   Laboratory: Most recent CBC Lab Results  Component Value Date   WBC 8.7 09/26/2022   HGB 10.1 (L) 09/26/2022   HCT 29.1 (L) 09/26/2022   MCV 99.7 09/26/2022   PLT 157 09/26/2022   Most recent BMP    Latest Ref Rng & Units 09/28/2022    2:28 AM  BMP  Glucose 70 - 99 mg/dL 83   BUN 6 - 20  mg/dL <5   Creatinine 0.44 - 1.00 mg/dL 0.62   Sodium 135 - 145 mmol/L 138   Potassium 3.5 - 5.1 mmol/L 3.7   Chloride 98 - 111 mmol/L 102   CO2 22 - 32 mmol/L 24   Calcium 8.9 - 10.3 mg/dL 8.5     Imaging/Diagnostic Tests: None new  Shary Key, DO 09/28/2022, 12:00 PM  PGY-3, [place] Intern pager: [phone], text pages welcome Secure chat group [place]

## 2022-09-29 LAB — BASIC METABOLIC PANEL
Anion gap: 7 (ref 5–15)
BUN: <5 mg/dL — ABNORMAL LOW (ref 6–20)
CO2: 26 mmol/L (ref 22–32)
Calcium: 8.5 mg/dL — ABNORMAL LOW (ref 8.9–10.3)
Chloride: 103 mmol/L (ref 98–111)
Creatinine, Ser: 0.76 mg/dL (ref 0.44–1.00)
GFR, Estimated: >60 mL/min (ref >60)
Glucose, Bld: 89 mg/dL (ref 70–99)
Potassium: 3.6 mmol/L (ref 3.5–5.1)
Sodium: 136 mmol/L (ref 135–145)

## 2022-09-29 MED ORDER — ACETAMINOPHEN 500 MG PO TABS: 1000.0000 mg | ORAL_TABLET | Freq: Four times a day (QID) | ORAL | 0 refills | Status: DC

## 2022-09-29 NOTE — Discharge Instructions (Addendum)
Dear Gail Blackburn,  Thank you for letting us participate in your care. You were hospitalized for abominal pain and diagnosed with Acute pancreatitis. You were treated with IV hydration and pain medication.   POST-HOSPITAL & CARE INSTRUCTIONS Please take your medications as directed below. Go to your follow up appointments (listed below)   DOCTOR'S APPOINTMENT   Future Appointments  Date Time Provider [place]  10/11/2022  2:00 PM Melvenia Beam, MD [facility] None     Take care and be well!  [facility]  [place], [place] [address] [phone]

## 2022-09-29 NOTE — Discharge Summary (Addendum)
[facility] Discharge Summary  Patient name: Gail Blackburn Medical record number: [id number] Date of birth: 1976/04/15 Age: 47 y.o. Gender: female Date of Admission: 09/23/2022  Date of Discharge: 09/29/21 Admitting Physician: Lowry Ram, MD  Primary Care Provider: Sharion Settler, DO Consultants: None  Indication for Hospitalization: Pancreatitis  Discharge Diagnoses/Problem List:  Principal Problem for Admission: Pancreatitis Other Problems addressed during stay:  Principal Problem:   Acute pancreatitis Active Problems:   Alcohol use disorder, moderate, dependence ([place])   Abdominal pain   Hypokalemia    Brief Hospital Course:  Gail Blackburn is a 47 y.o.female with a history of AUD, H/o alcoholic pancreatitis c/b splenic vein thrombosis, OUD, migraines, bipolar disorder. who was admitted to the [facility] Medicine Teaching Service at [facility] for abdominal pain in the setting of presumed Acute pancreatitis . Her hospital course is detailed below:  Acute Pancreatitis Patient present with abdominal pain, nausea and vomiting. Initial findings show elevated lipase to 141 and CT abdomen show moderate peripancreatic inflammation consistent with acute pancreatitis. Patient was placed on NPO and treated with fluids. Initial pain regimen include Tylenol and Toradol however she was eventually started on PRN Dilaudid and Oxycodone for breakthrough pain. At that time suboxone was stopped.  Nausea and Vomiting was treated with antiemetics while monitoring Qtc closely with EKG. Patient was transitioned to regular diet on 09/29/21.   Alcohol Use disorder Patient disclosed last alcohol use on 12/29. Placed on CIWA protocol. Patient did not require additional ativan during admission outside of her home regimen.   Opioid Use Disorder Patient was continued on suboxone when admitted; however, required stronger analgesics for pain related to acute pancreatitis. Once prn  dilaudid was started on 12/31, suboxone was stopped. Suboxone was restarted at discharge.   Other chronic conditions were medically managed with home medications and formulary alternatives as necessary (migraines, contraception)  PCP Follow-up Recommendations: Consider alcohol cessation with naltrexone, has psychiatrist Dr. Reece Levy (long term, also prescribes her suboxone)   Disposition: home  Discharge Condition: medically stable   Discharge Exam:  Vitals:   09/29/22 0534 09/29/22 0905  BP: 132/83 107/68  Pulse: (!) 51 70  Resp:  17  Temp: (!) 97.5 F (36.4 C) 98.2 F (36.8 C)  SpO2: 100% 100%   General: NAD  Neuro: A&O Cardiovascular: RRR, no murmurs, no peripheral edema Respiratory: normal WOB on RA, CTAB, no wheezes, ronchi or rales Abdomen: soft, NTTP, no rebound or guarding Extremities: Moving all 4 extremities equally   Significant Procedures: none  Significant Labs and Imaging:  No results for input(s): "WBC", "HGB", "HCT", "PLT" in the last 48 hours. Recent Labs  Lab 09/28/22 0228 09/29/22 0314  NA 138 136  K 3.7 3.6  CL 102 103  CO2 24 26  GLUCOSE 83 89  BUN <5* <5*  CREATININE 0.62 0.76  CALCIUM 8.5* 8.5*  MG 1.9  --     CT Abd/Pel 09/24/22 IMPRESSION: 1. Positive for Acute Pancreatitis. Moderate peripancreatic inflammation. No pancreatic necrosis, splenic vein thrombosis, or other complicating feature. No organized or drainable fluid collection at this time. Trace associated free fluid in the pelvis.   2. No other acute or inflammatory process identified in the abdomen or pelvis.  Results/Tests Pending at Time of Discharge: none  Discharge Medications:  Allergies as of 09/29/2022       Reactions   Latex Rash, Other (See Comments)   Only in vaginal area   Tape Rash, Other (See Comments)   Rashes occur  only if left on for a long time        Medication List     STOP taking these medications    methylPREDNISolone 4 MG Tbpk  tablet Commonly known as: MEDROL DOSEPAK       TAKE these medications    acetaminophen 500 MG tablet Commonly known as: TYLENOL Take 2 tablets (1,000 mg total) by mouth every 6 (six) hours.   ALPRAZolam 0.5 MG tablet Commonly known as: XANAX Take 0.5 mg by mouth 4 (four) times daily.   Botox 100 units Solr injection Generic drug: botulinum toxin Type A PROVIDER TO INJECT 155 UNITS INTRAMUSCULARLY INTO HEAD AND NECK EVERY 12 WEEKS. DISCARD REMAINDER.   buPROPion 150 MG 12 hr tablet Commonly known as: WELLBUTRIN SR Take 150 mg by mouth in the morning.   cyclobenzaprine 10 MG tablet Commonly known as: FLEXERIL Take 1 tablet (10 mg total) by mouth 3 (three) times daily as needed for muscle spasms. Do not take with xanax or gabapentin to avoid significant sedation.   Jencycla 0.35 MG tablet Generic drug: norethindrone Take 1 tablet by mouth daily.   Nurtec 75 MG Tbdp Generic drug: Rimegepant Sulfate Take 75 mg by mouth daily as needed. For migraines. Take as close to onset of migraine as possible. One daily maximum. What changed: reasons to take this   ondansetron 4 MG tablet Commonly known as: ZOFRAN TAKE 1 TABLET BY MOUTH EVERY 6 HOURS AS NEEDED FOR NAUSEA FOR VOMITING What changed:  how much to take how to take this when to take this reasons to take this additional instructions   ProAir HFA 108 (90 Base) MCG/ACT inhaler Generic drug: albuterol Inhale 2 puffs into the lungs every 6 (six) hours as needed for wheezing or shortness of breath.   propranolol ER 60 MG 24 hr capsule Commonly known as: INDERAL LA Take 1 capsule (60 mg total) by mouth daily.   Rexulti 2 MG Tabs tablet Generic drug: brexpiprazole Take 2 mg by mouth daily.   rizatriptan 10 MG disintegrating tablet Commonly known as: MAXALT-MLT 1 TABLET FOR MIGRAINE. MAY REPEAT IN 2 HOURS. What changed:  how much to take how to take this when to take this reasons to take this   Suboxone 8-2 MG  Film Generic drug: Buprenorphine HCl-Naloxone HCl Place 1 Film under the tongue in the morning and at bedtime.   venlafaxine XR 75 MG 24 hr capsule Commonly known as: EFFEXOR-XR Take 1 capsule (75 mg total) by mouth daily with breakfast.        Discharge Instructions: Please refer to Patient Instructions section of EMR for full details.  Patient was counseled important signs and symptoms that should prompt return to medical care, changes in medications, dietary instructions, activity restrictions, and follow up appointments.   Follow-Up Appointments:  Follow-up Information     Sharion Settler, DO. Go on 10/07/2022.   Specialty: Family Medicine Why: apt at 3:10 Contact information: [place] [address] [phone]                 Precious Gilding, DO 09/29/2022, 7:31 PM PGY-1, [place]

## 2022-09-29 NOTE — Progress Notes (Signed)
Airborne/contact order was d/c'd via protocol, pt had all negative respiratory test--- NEG for covid, NEG for flu A, flu B, NEG RSV.    Therefore order was discontinued.

## 2022-10-05 ENCOUNTER — Other Ambulatory Visit (HOSPITAL_COMMUNITY): Payer: Self-pay

## 2022-10-05 NOTE — Telephone Encounter (Signed)
Pt's insurance changed from [facility] Medicaid to [facility], does she need a new auth for 1/16 botox appt? Also will this be B/B or SP?

## 2022-10-06 ENCOUNTER — Other Ambulatory Visit (HOSPITAL_COMMUNITY): Payer: Self-pay

## 2022-10-06 ENCOUNTER — Telehealth: Payer: Self-pay | Admitting: Neurology

## 2022-10-06 ENCOUNTER — Ambulatory Visit: Payer: Medicaid Other | Admitting: Neurology

## 2022-10-06 NOTE — Telephone Encounter (Signed)
The patient is scheduled for botox next week. I called [facility] to set up delivery, but she got new insurance on 07/27/22 which she needs a new PA for. We do not have a copy of her [employer] card on file so I couldn't give [facility] her new BIN from the card. They tried to call her to get the new insurance info and were not able to reach her on her home number and she didn't have a voicemail and the cell number a man answered and said it was the wrong number. Please start a new PA for botox with [facility].

## 2022-10-06 NOTE — Telephone Encounter (Signed)
Also tried to call patient but her VM was not setup.

## 2022-10-06 NOTE — Telephone Encounter (Signed)
Chronic Migraine CPT 64615  Botox J0585 Units:200  G43.711 Chronic Migraine without aura, intractable, with status migrainous  Can you do auth asap for appt on 10/11/22? Pt has new insurance

## 2022-10-06 NOTE — Telephone Encounter (Signed)
Pharmacy Patient Advocate Encounter   Received notification from [facility] that prior authorization for Botox 100UNIT solution is required/requested.    PA submitted on 10/06/2022 to (ins) [employer] via CoverMyMeds [facility] Status is pending

## 2022-10-07 ENCOUNTER — Other Ambulatory Visit (HOSPITAL_COMMUNITY): Payer: Self-pay

## 2022-10-07 ENCOUNTER — Ambulatory Visit: Payer: Self-pay | Admitting: Family Medicine

## 2022-10-07 NOTE — Telephone Encounter (Signed)
Pharmacy Patient Advocate Encounter  Prior Authorization for Botox 100UNIT solution has been approved.    PA# PA Case ID: [id number] Effective dates: 10/06/2022 through 10/06/2023 Per test billing this copay is $4 and is SP-medication can be filled at [facility].

## 2022-10-10 ENCOUNTER — Other Ambulatory Visit: Payer: Self-pay

## 2022-10-10 ENCOUNTER — Other Ambulatory Visit (HOSPITAL_COMMUNITY): Payer: Self-pay

## 2022-10-10 ENCOUNTER — Other Ambulatory Visit: Payer: Self-pay | Admitting: *Deleted

## 2022-10-10 DIAGNOSIS — G43711 Chronic migraine without aura, intractable, with status migrainosus: Secondary | ICD-10-CM

## 2022-10-10 MED ORDER — BOTOX 200 UNITS IJ SOLR
INTRAMUSCULAR | 3 refills | Status: DC
  Filled 2022-10-10: qty 1, 84d supply, fill #0
  Filled 2022-10-10: qty 1, fill #0
  Filled 2022-12-28: qty 1, 84d supply, fill #1
  Filled 2023-04-03: qty 1, 84d supply, fill #2
  Filled 2023-06-06: qty 1, 84d supply, fill #3

## 2022-10-10 NOTE — Telephone Encounter (Signed)
Appointment note updated and Rx sent to [facility] asking for urgent fill.

## 2022-10-11 ENCOUNTER — Ambulatory Visit: Payer: Medicaid Other | Admitting: Neurology

## 2022-10-11 DIAGNOSIS — G43711 Chronic migraine without aura, intractable, with status migrainosus: Secondary | ICD-10-CM

## 2022-10-11 DIAGNOSIS — G43009 Migraine without aura, not intractable, without status migrainosus: Secondary | ICD-10-CM

## 2022-10-11 MED ORDER — NURTEC 75 MG PO TBDP: 75.0000 mg | ORAL_TABLET | Freq: Every day | ORAL | 6 refills | Status: DC | PRN

## 2022-10-11 MED ORDER — ONABOTULINUMTOXINA 200 UNITS IJ SOLR
155.0000 [IU] | Freq: Once | INTRAMUSCULAR | Status: AC
  Administered 2022-10-11: 155 [IU] via INTRAMUSCULAR

## 2022-10-11 NOTE — Progress Notes (Addendum)
10/11/2022: Doing well on botox, > 50% improvement, She has 4-6 migriane days a month, < 10 total headache days a month, failed imitrex, maxalt and ubrelvy, zomig, relpax will try and get nurtec acutely approved. Dr Domingo Cocking doesn't take her insurance. Referral for pain clinic fax to [facility] (reminded her to contact Dr. Cleon Gustin) Phone: [phone], Fax:  [phone] for occipital neuralgia.    Meds ordered this encounter  Medications   botulinum toxin Type A (BOTOX) injection 155 Units    Botox- 200 units x 1 vial Lot: [id number] Expiration: 02/2025 NDC: 6270-3500-93  Bacteriostatic 0.9% Sodium Chloride- 89mL total Lot: [id number] Expiration: 11/25 NDC: 7169-6789-38  Dx: B01.751 S/P   DISCONTD: Rimegepant Sulfate (NURTEC) 75 MG TBDP    Sig: Take 75 mg by mouth daily as needed. For migraines. Take as close to onset of migraine as possible. One daily maximum.    Dispense:  10 tablet    Refill:  6    She has 4-6 migriane days a month, < 10 total headache days a month, failed imitrex, maxalt and ubrelvy, zomig, relpax   Rimegepant Sulfate (NURTEC) 75 MG TBDP    Sig: Take 75 mg by mouth daily as needed. For migraines. Take as close to onset of migraine as possible. One daily maximum.    Dispense:  10 tablet    Refill:  6    She has 4-6 migriane days a month, < 10 total headache days a month, failed imitrex, maxalt and ubrelvy, zomig, relpax     07/14/2022: Doing well on botox for her migraines(>>50% relief of migraine severity, freq and duration with botox) but still with considerable pain in occipital headaches and nerve blocks are the only thing that helps her occipital headaches. [facility].  No orders of the defined types were placed in this encounter.    04/26/2022: emgality did not help. Ajovy not approved. Botox working great >> 50% improvement in migraine frequency. Needs acute management. Failed  imitrex and maxalt 9work ok) Ablation of occipital nerves helped a lot. Now having 4 migraine days a month and < 10 total headache days a month needs a good acute management medication. Will try nurtec  Meds ordered this encounter  Medications   botulinum toxin Type A (BOTOX) injection 155 Units    Botox- 200 units x 1 vial Lot: [id number] Expiration: 02/2025 NDC: 7824-2353-61  Bacteriostatic 0.9% Sodium Chloride- 72mL total Lot: [id number] Expiration: 11/25 NDC: 0867-6195-09  Dx: T26.712 S/P     01/26/2022: Botox gives > 50% relief but still having 8 migraine days a month and > 15 headache days a month will try Ajovy, emgality not helping, aimovig contraindicated due to constipation.     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 >50% improvement, she has a lot of neck pain, getting injections at [facility]. Resend to [facility], she requested some dry needling, loved Janett Billow.    Consent Form Botulism Toxin Injection For Chronic Migraine    Reviewed orally with patient, additionally signature is on file:  Botulism toxin has been approved by the Federal drug administration for treatment of chronic migraine. Botulism toxin does not cure chronic migraine and it may not be effective in some patients.  The administration of botulism toxin is accomplished by injecting a small amount of toxin into the muscles of the neck and head. Dosage must be titrated for each individual. Any benefits resulting from botulism toxin tend  to wear off after 3 months with a repeat injection required if benefit is to be maintained. Injections are usually done every 3-4 months with maximum effect peak achieved by about 2 or 3 weeks. Botulism toxin is expensive and you should be sure of what costs you will incur resulting from the injection.  The side effects of botulism toxin use for chronic migraine may include:   -Transient, and usually mild, facial weakness with facial  injections  -Transient, and usually mild, head or neck weakness with head/neck injections  -Reduction or loss of forehead facial animation due to forehead muscle weakness  -Eyelid drooping  -Dry eye  -Pain at the site of injection or bruising at the site of injection  -Double vision  -Potential unknown long term risks   Contraindications: You should not have Botox if you are pregnant, nursing, allergic to albumin, have an infection, skin condition, or muscle weakness at the site of the injection, or have myasthenia gravis, Lambert-Eaton syndrome, or ALS.  It is also possible that as with any injection, there may be an allergic reaction or no effect from the medication. Reduced effectiveness after repeated injections is sometimes seen and rarely infection at the injection site may occur. All care will be taken to prevent these side effects. If therapy is given over a long time, atrophy and wasting in the muscle injected may occur. Occasionally the patient's become refractory to treatment because they develop antibodies to the toxin. In this event, therapy needs to be modified.  I have read the above information and consent to the administration of botulism toxin.    BOTOX PROCEDURE NOTE FOR MIGRAINE HEADACHE  Contraindications and precautions discussed with patient(above). Aseptic rocedure was observed and patient tolerated procedure.   The condition has existed for more than 6 months, and pt does not have a diagnosis of ALS, Myasthenia Gravis or Lambert-Eaton Syndrome.  Risks and benefits of injections discussed and pt agrees to proceed with the procedure.  Written consent obtained  These injections are medically necessary. Pt  receives good benefits from these injections. These injections do not cause sedations or hallucinations which the oral therapies may cause.   Description of procedure:  The patient was placed in a sitting position. The standard protocol was used for Botox as  follows, with 5 units of Botox injected at each site:  -Procerus muscle, midline injection  -Corrugator muscle, bilateral injection  -Frontalis muscle, bilateral injection, with 2 sites each side, medial injection was performed in the upper one third of the frontalis muscle, in the region vertical from the medial inferior edge of the superior orbital rim. The lateral injection was again in the upper one third of the forehead vertically above the lateral limbus of the cornea, 1.5 cm lateral to the medial injection site.  -Temporalis muscle injection, 4 sites, bilaterally. The first injection was 3 cm above the tragus of the ear, second injection site was 1.5 cm to 3 cm up from the first injection site in line with the tragus of the ear. The third injection site was 1.5-3 cm forward between the first 2 injection sites. The fourth injection site was 1.5 cm posterior to the second injection site. 5th site laterally in the temporalis  muscleat the level of the outer canthus.  -Occipitalis muscle injection, 3 sites, bilaterally. The first injection was done one half way between the occipital protuberance and the tip of the mastoid process behind the ear. The second injection site was done lateral and superior  to the first, 1 fingerbreadth from the first injection. The third injection site was 1 fingerbreadth superiorly and medially from the first injection site.  -Cervical paraspinal muscle injection, 2 sites, bilaterally. The first injection site was 1 cm from the midline of the cervical spine, 3 cm inferior to the lower border of the occipital protuberance. The second injection site was 1.5 cm superiorly and laterally to the first injection site.  -Trapezius muscle injection was performed at 3 sites, bilaterally. The first injection site was in the upper trapezius muscle halfway between the inflection point of the neck, and the acromion. The second injection site was one half way between the acromion and  the first injection site. The third injection was done between the first injection site and the inflection point of the neck.  Will return for repeat injection in 3 months.   A total of 155 units of Botox was prepared, 45 units of Botox was injected as documented above, any Botox not injected was wasted. The patient tolerated the procedure well, there were no complications of the above procedure.

## 2022-10-11 NOTE — Progress Notes (Addendum)
Botox- 200 units x 1 vial Lot: [id number] Expiration: 02/2025 NDC: [id number]  Bacteriostatic 0.9% Sodium Chloride- 25m total Lot: [id number]: 11/25 NDC: [id number] Dx: [id number]/P

## 2022-10-11 NOTE — Patient Instructions (Signed)
Referral for pain clinic fax to [facility] (Dr. Cleon Gustin) Phone: [phone], Fax:  [phone]

## 2022-10-13 MED ORDER — PROPRANOLOL HCL ER 60 MG PO CP24: 60.0000 mg | ORAL_CAPSULE | Freq: Every day | ORAL | 1 refills | Status: DC

## 2022-10-13 NOTE — Addendum Note (Signed)
Addended by: Gildardo Griffes on: 10/13/2022 04:00 PM   Modules accepted: Orders

## 2022-10-17 DIAGNOSIS — H5213 Myopia, bilateral: Secondary | ICD-10-CM | POA: Diagnosis not present

## 2022-10-18 ENCOUNTER — Telehealth: Payer: Self-pay | Admitting: *Deleted

## 2022-10-18 NOTE — Telephone Encounter (Signed)
Received Nurtec PA from [place]. Nurtec PA completed, signed, and faxed with office notes to [facility]. Received a receipt of confirmation. [id number]

## 2022-10-20 NOTE — Telephone Encounter (Signed)
Received fax from [employer] stating Nurtec has been approved for #10 tablets from 10/18/22 - 10/18/23. Request # [id number]  Notice faxed to [place]. Received a receipt of confirmation.

## 2022-10-21 DIAGNOSIS — F3181 Bipolar II disorder: Secondary | ICD-10-CM | POA: Diagnosis not present

## 2022-10-25 DIAGNOSIS — Z79899 Other long term (current) drug therapy: Secondary | ICD-10-CM | POA: Diagnosis not present

## 2022-10-25 DIAGNOSIS — F3181 Bipolar II disorder: Secondary | ICD-10-CM | POA: Diagnosis not present

## 2022-10-27 DIAGNOSIS — Z049 Encounter for examination and observation for unspecified reason: Secondary | ICD-10-CM | POA: Diagnosis not present

## 2022-10-27 DIAGNOSIS — Z79899 Other long term (current) drug therapy: Secondary | ICD-10-CM | POA: Diagnosis not present

## 2022-10-27 DIAGNOSIS — G43719 Chronic migraine without aura, intractable, without status migrainosus: Secondary | ICD-10-CM | POA: Diagnosis not present

## 2022-10-28 DIAGNOSIS — G43719 Chronic migraine without aura, intractable, without status migrainosus: Secondary | ICD-10-CM | POA: Diagnosis not present

## 2022-10-28 DIAGNOSIS — M542 Cervicalgia: Secondary | ICD-10-CM | POA: Diagnosis not present

## 2022-10-28 DIAGNOSIS — M791 Myalgia, unspecified site: Secondary | ICD-10-CM | POA: Diagnosis not present

## 2022-10-28 DIAGNOSIS — G518 Other disorders of facial nerve: Secondary | ICD-10-CM | POA: Diagnosis not present

## 2022-11-15 DIAGNOSIS — Z01419 Encounter for gynecological examination (general) (routine) without abnormal findings: Secondary | ICD-10-CM | POA: Diagnosis not present

## 2022-11-15 DIAGNOSIS — Z1231 Encounter for screening mammogram for malignant neoplasm of breast: Secondary | ICD-10-CM | POA: Diagnosis not present

## 2022-11-15 DIAGNOSIS — A609 Anogenital herpesviral infection, unspecified: Secondary | ICD-10-CM | POA: Diagnosis not present

## 2022-11-15 DIAGNOSIS — N858 Other specified noninflammatory disorders of uterus: Secondary | ICD-10-CM | POA: Diagnosis not present

## 2022-11-15 DIAGNOSIS — B009 Herpesviral infection, unspecified: Secondary | ICD-10-CM | POA: Diagnosis not present

## 2022-11-15 DIAGNOSIS — Z1389 Encounter for screening for other disorder: Secondary | ICD-10-CM | POA: Diagnosis not present

## 2022-11-15 DIAGNOSIS — Z3041 Encounter for surveillance of contraceptive pills: Secondary | ICD-10-CM | POA: Diagnosis not present

## 2022-11-17 DIAGNOSIS — G518 Other disorders of facial nerve: Secondary | ICD-10-CM | POA: Diagnosis not present

## 2022-11-17 DIAGNOSIS — M791 Myalgia, unspecified site: Secondary | ICD-10-CM | POA: Diagnosis not present

## 2022-11-17 DIAGNOSIS — G43719 Chronic migraine without aura, intractable, without status migrainosus: Secondary | ICD-10-CM | POA: Diagnosis not present

## 2022-11-17 DIAGNOSIS — M542 Cervicalgia: Secondary | ICD-10-CM | POA: Diagnosis not present

## 2022-11-22 DIAGNOSIS — F3181 Bipolar II disorder: Secondary | ICD-10-CM | POA: Diagnosis not present

## 2022-11-28 DIAGNOSIS — N852 Hypertrophy of uterus: Secondary | ICD-10-CM | POA: Diagnosis not present

## 2022-11-28 DIAGNOSIS — N83202 Unspecified ovarian cyst, left side: Secondary | ICD-10-CM | POA: Diagnosis not present

## 2022-11-30 ENCOUNTER — Institutional Professional Consult (permissible substitution) (HOSPITAL_BASED_OUTPATIENT_CLINIC_OR_DEPARTMENT_OTHER): Payer: Medicaid Other | Admitting: Pulmonary Disease

## 2022-12-02 DIAGNOSIS — M791 Myalgia, unspecified site: Secondary | ICD-10-CM | POA: Diagnosis not present

## 2022-12-02 DIAGNOSIS — M542 Cervicalgia: Secondary | ICD-10-CM | POA: Diagnosis not present

## 2022-12-02 DIAGNOSIS — G518 Other disorders of facial nerve: Secondary | ICD-10-CM | POA: Diagnosis not present

## 2022-12-02 DIAGNOSIS — G43719 Chronic migraine without aura, intractable, without status migrainosus: Secondary | ICD-10-CM | POA: Diagnosis not present

## 2022-12-05 NOTE — Telephone Encounter (Signed)
Received this message from our billing department:   Pharmacy [facility]) called on Gail Blackburn DOB December 02, 1975 stating her previous insurance has termed and now has MCD which they are out of network for   Patient's Rx is now being filled at [facility] under her new insurance. Jillian, would you be able to call [facility] and let them know to cancel the prescription on their end?

## 2022-12-05 NOTE — Telephone Encounter (Signed)
Called [facility] and informed them to cx prescription.

## 2022-12-28 ENCOUNTER — Other Ambulatory Visit: Payer: Self-pay

## 2022-12-28 ENCOUNTER — Other Ambulatory Visit (HOSPITAL_COMMUNITY): Payer: Self-pay

## 2022-12-29 ENCOUNTER — Telehealth: Payer: Self-pay | Admitting: Neurology

## 2022-12-29 NOTE — Telephone Encounter (Signed)
Received 1 200 unit vial of Botox. 

## 2022-12-30 DIAGNOSIS — G43719 Chronic migraine without aura, intractable, without status migrainosus: Secondary | ICD-10-CM | POA: Diagnosis not present

## 2022-12-30 DIAGNOSIS — M791 Myalgia, unspecified site: Secondary | ICD-10-CM | POA: Diagnosis not present

## 2022-12-30 DIAGNOSIS — M542 Cervicalgia: Secondary | ICD-10-CM | POA: Diagnosis not present

## 2022-12-30 DIAGNOSIS — G518 Other disorders of facial nerve: Secondary | ICD-10-CM | POA: Diagnosis not present

## 2023-01-03 ENCOUNTER — Ambulatory Visit: Payer: Medicaid Other | Admitting: Neurology

## 2023-01-03 DIAGNOSIS — G43711 Chronic migraine without aura, intractable, with status migrainosus: Secondary | ICD-10-CM | POA: Diagnosis not present

## 2023-01-03 MED ORDER — ONABOTULINUMTOXINA 200 UNITS IJ SOLR
155.0000 [IU] | Freq: Once | INTRAMUSCULAR | Status: AC
  Administered 2023-01-03: 155 [IU] via INTRAMUSCULAR

## 2023-01-03 NOTE — Progress Notes (Signed)
01/03/2023: stable, saw Dr Neale Burly they took her insurance and even feeling better.   10/11/2022: Doing well on botox, > 50% improvement, She has 4-6 migriane days a month, < 10 total headache days a month, failed imitrex, maxalt and ubrelvy, zomig, relpax will try and get nurtec acutely approved. Dr Neale Burly doesn't take her insurance. Referral for pain clinic fax to [facility] (reminded her to contact Dr. Charyl Bigger) Phone: [phone], Fax:  [phone] for occipital neuralgia.    No orders of the defined types were placed in this encounter.    07/14/2022: Doing well on botox for her migraines(>>50% relief of migraine severity, freq and duration with botox) but still with considerable pain in occipital headaches and nerve blocks are the only thing that helps her occipital headaches. [facility].  No orders of the defined types were placed in this encounter.    04/26/2022: emgality did not help. Ajovy not approved. Botox working great >> 50% improvement in migraine frequency. Needs acute management. Failed imitrex and maxalt 9work ok) Ablation of occipital nerves helped a lot. Now having 4 migraine days a month and < 10 total headache days a month needs a good acute management medication. Will try nurtec  No orders of the defined types were placed in this encounter.    01/26/2022: Botox gives > 50% relief but still having 8 migraine days a month and > 15 headache days a month will try Ajovy, emgality not helping, aimovig contraindicated due to constipation.     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 >50% improvement, she has a lot of neck pain, getting injections at [facility]. Resend to [facility], she requested some dry needling, loved Shanda Bumps.    Consent Form Botulism Toxin Injection For Chronic Migraine    Reviewed orally with patient, additionally signature is on  file:  Botulism toxin has been approved by the Federal drug administration for treatment of chronic migraine. Botulism toxin does not cure chronic migraine and it may not be effective in some patients.  The administration of botulism toxin is accomplished by injecting a small amount of toxin into the muscles of the neck and head. Dosage must be titrated for each individual. Any benefits resulting from botulism toxin tend to wear off after 3 months with a repeat injection required if benefit is to be maintained. Injections are usually done every 3-4 months with maximum effect peak achieved by about 2 or 3 weeks. Botulism toxin is expensive and you should be sure of what costs you will incur resulting from the injection.  The side effects of botulism toxin use for chronic migraine may include:   -Transient, and usually mild, facial weakness with facial injections  -Transient, and usually mild, head or neck weakness with head/neck injections  -Reduction or loss of forehead facial animation due to forehead muscle weakness  -Eyelid drooping  -Dry eye  -Pain at the site of injection or bruising at the site of injection  -Double vision  -Potential unknown long term risks   Contraindications: You should not have Botox if you are pregnant, nursing, allergic to albumin, have an infection, skin condition, or muscle weakness at the site of the injection, or have myasthenia gravis, Lambert-Eaton syndrome, or ALS.  It is also possible that as with any injection, there may be an allergic reaction or no effect from the medication. Reduced effectiveness after repeated injections is sometimes seen and rarely infection at  the injection site may occur. All care will be taken to prevent these side effects. If therapy is given over a long time, atrophy and wasting in the muscle injected may occur. Occasionally the patient's become refractory to treatment because they develop antibodies to the toxin. In this event,  therapy needs to be modified.  I have read the above information and consent to the administration of botulism toxin.    BOTOX PROCEDURE NOTE FOR MIGRAINE HEADACHE  Contraindications and precautions discussed with patient(above). Aseptic rocedure was observed and patient tolerated procedure.   The condition has existed for more than 6 months, and pt does not have a diagnosis of ALS, Myasthenia Gravis or Lambert-Eaton Syndrome.  Risks and benefits of injections discussed and pt agrees to proceed with the procedure.  Written consent obtained  These injections are medically necessary. Pt  receives good benefits from these injections. These injections do not cause sedations or hallucinations which the oral therapies may cause.   Description of procedure:  The patient was placed in a sitting position. The standard protocol was used for Botox as follows, with 5 units of Botox injected at each site:  -Procerus muscle, midline injection  -Corrugator muscle, bilateral injection  -Frontalis muscle, bilateral injection, with 2 sites each side, medial injection was performed in the upper one third of the frontalis muscle, in the region vertical from the medial inferior edge of the superior orbital rim. The lateral injection was again in the upper one third of the forehead vertically above the lateral limbus of the cornea, 1.5 cm lateral to the medial injection site.  -Temporalis muscle injection, 4 sites, bilaterally. The first injection was 3 cm above the tragus of the ear, second injection site was 1.5 cm to 3 cm up from the first injection site in line with the tragus of the ear. The third injection site was 1.5-3 cm forward between the first 2 injection sites. The fourth injection site was 1.5 cm posterior to the second injection site. 5th site laterally in the temporalis  muscleat the level of the outer canthus.  -Occipitalis muscle injection, 3 sites, bilaterally. The first injection was done  one half way between the occipital protuberance and the tip of the mastoid process behind the ear. The second injection site was done lateral and superior to the first, 1 fingerbreadth from the first injection. The third injection site was 1 fingerbreadth superiorly and medially from the first injection site.  -Cervical paraspinal muscle injection, 2 sites, bilaterally. The first injection site was 1 cm from the midline of the cervical spine, 3 cm inferior to the lower border of the occipital protuberance. The second injection site was 1.5 cm superiorly and laterally to the first injection site.  -Trapezius muscle injection was performed at 3 sites, bilaterally. The first injection site was in the upper trapezius muscle halfway between the inflection point of the neck, and the acromion. The second injection site was one half way between the acromion and the first injection site. The third injection was done between the first injection site and the inflection point of the neck.  Will return for repeat injection in 3 months.   A total of 155 units of Botox was prepared, 45 units of Botox was injected as documented above, any Botox not injected was wasted. The patient tolerated the procedure well, there were no complications of the above procedure.

## 2023-01-03 NOTE — Progress Notes (Signed)
Botox- 200 units x 1 vial Lot: [id number] Expiration: 02/2025 NDC: 7116-5790-38  Bacteriostatic 0.9% Sodium Chloride- 4 mL total Lot: [id number] Expiration: 07/2024 NDC: 91916-606-00  Dx: G43.711 S/P  Witnessed by Delmer Islam

## 2023-01-16 DIAGNOSIS — F3181 Bipolar II disorder: Secondary | ICD-10-CM | POA: Diagnosis not present

## 2023-01-23 DIAGNOSIS — F3181 Bipolar II disorder: Secondary | ICD-10-CM | POA: Diagnosis not present

## 2023-02-14 ENCOUNTER — Other Ambulatory Visit: Payer: Self-pay | Admitting: Neurology

## 2023-02-18 ENCOUNTER — Encounter (HOSPITAL_COMMUNITY): Payer: Self-pay | Admitting: Pharmacy Technician

## 2023-02-18 ENCOUNTER — Other Ambulatory Visit: Payer: Self-pay

## 2023-02-18 ENCOUNTER — Emergency Department (HOSPITAL_COMMUNITY): Payer: Medicaid Other

## 2023-02-18 ENCOUNTER — Inpatient Hospital Stay (HOSPITAL_COMMUNITY)
Admission: EM | Admit: 2023-02-18 | Discharge: 2023-02-21 | DRG: 439 | Disposition: A | Discharge status: Home / Self Care | Payer: Medicaid Other | Attending: Family Medicine | Admitting: Family Medicine

## 2023-02-18 DIAGNOSIS — Z87891 Personal history of nicotine dependence: Secondary | ICD-10-CM

## 2023-02-18 DIAGNOSIS — Z91040 Latex allergy status: Secondary | ICD-10-CM

## 2023-02-18 DIAGNOSIS — Z91048 Other nonmedicinal substance allergy status: Secondary | ICD-10-CM

## 2023-02-18 DIAGNOSIS — K852 Alcohol induced acute pancreatitis without necrosis or infection: Principal | ICD-10-CM

## 2023-02-18 DIAGNOSIS — Z79899 Other long term (current) drug therapy: Secondary | ICD-10-CM

## 2023-02-18 DIAGNOSIS — F319 Bipolar disorder, unspecified: Secondary | ICD-10-CM | POA: Diagnosis present

## 2023-02-18 DIAGNOSIS — Z634 Disappearance and death of family member: Secondary | ICD-10-CM

## 2023-02-18 DIAGNOSIS — Z6821 Body mass index (BMI) 21.0-21.9, adult: Secondary | ICD-10-CM

## 2023-02-18 DIAGNOSIS — F419 Anxiety disorder, unspecified: Secondary | ICD-10-CM | POA: Diagnosis present

## 2023-02-18 DIAGNOSIS — Z86718 Personal history of other venous thrombosis and embolism: Secondary | ICD-10-CM

## 2023-02-18 DIAGNOSIS — K219 Gastro-esophageal reflux disease without esophagitis: Secondary | ICD-10-CM | POA: Diagnosis present

## 2023-02-18 DIAGNOSIS — F1020 Alcohol dependence, uncomplicated: Secondary | ICD-10-CM | POA: Diagnosis present

## 2023-02-18 DIAGNOSIS — R9431 Abnormal electrocardiogram [ECG] [EKG]: Secondary | ICD-10-CM | POA: Diagnosis not present

## 2023-02-18 DIAGNOSIS — M797 Fibromyalgia: Secondary | ICD-10-CM | POA: Diagnosis present

## 2023-02-18 DIAGNOSIS — R109 Unspecified abdominal pain: Secondary | ICD-10-CM | POA: Diagnosis not present

## 2023-02-18 DIAGNOSIS — R1013 Epigastric pain: Secondary | ICD-10-CM | POA: Diagnosis not present

## 2023-02-18 DIAGNOSIS — E46 Unspecified protein-calorie malnutrition: Secondary | ICD-10-CM | POA: Diagnosis present

## 2023-02-18 DIAGNOSIS — F1290 Cannabis use, unspecified, uncomplicated: Secondary | ICD-10-CM | POA: Diagnosis present

## 2023-02-18 DIAGNOSIS — K859 Acute pancreatitis without necrosis or infection, unspecified: Secondary | ICD-10-CM | POA: Diagnosis present

## 2023-02-18 LAB — CBC
HCT: 36.6 % (ref 36.0–46.0)
Hemoglobin: 12.3 g/dL (ref 12.0–15.0)
MCH: 32.9 pg (ref 26.0–34.0)
MCHC: 33.6 g/dL (ref 30.0–36.0)
MCV: 97.9 fL (ref 80.0–100.0)
Platelets: 254 10*3/uL (ref 150–400)
RBC: 3.74 MIL/uL — ABNORMAL LOW (ref 3.87–5.11)
RDW: 13.5 % (ref 11.5–15.5)
WBC: 10.6 10*3/uL — ABNORMAL HIGH (ref 4.0–10.5)
nRBC: 0 % (ref 0.0–0.2)

## 2023-02-18 LAB — COMPREHENSIVE METABOLIC PANEL
ALT: 10 U/L (ref 0–44)
AST: 40 U/L (ref 15–41)
Albumin: 4.2 g/dL (ref 3.5–5.0)
Alkaline Phosphatase: 47 U/L (ref 38–126)
Anion gap: 11 (ref 5–15)
BUN: 9 mg/dL (ref 6–20)
CO2: 22 mmol/L (ref 22–32)
Calcium: 9 mg/dL (ref 8.9–10.3)
Chloride: 99 mmol/L (ref 98–111)
Creatinine, Ser: 0.93 mg/dL (ref 0.44–1.00)
GFR, Estimated: >60 mL/min (ref >60)
Glucose, Bld: 106 mg/dL — ABNORMAL HIGH (ref 70–99)
Potassium: 4.3 mmol/L (ref 3.5–5.1)
Sodium: 132 mmol/L — ABNORMAL LOW (ref 135–145)
Total Bilirubin: 1.7 mg/dL — ABNORMAL HIGH (ref 0.3–1.2)
Total Protein: 6.1 g/dL — ABNORMAL LOW (ref 6.5–8.1)

## 2023-02-18 LAB — I-STAT BETA HCG BLOOD, ED (MC, WL, AP ONLY): I-stat hCG, quantitative: <5 m[IU]/mL (ref <5)

## 2023-02-18 LAB — LIPASE, BLOOD: Lipase: 145 U/L — ABNORMAL HIGH (ref 11–51)

## 2023-02-18 MED ORDER — IOHEXOL 350 MG/ML SOLN
75.0000 mL | Freq: Once | INTRAVENOUS | Status: AC | PRN
  Administered 2023-02-18: 75 mL via INTRAVENOUS

## 2023-02-18 MED ORDER — MORPHINE SULFATE (PF) 4 MG/ML IV SOLN
4.0000 mg | Freq: Once | INTRAVENOUS | Status: AC
  Administered 2023-02-18: 4 mg via INTRAVENOUS
  Filled 2023-02-18: qty 1

## 2023-02-18 MED ORDER — SODIUM CHLORIDE 0.9 % IV BOLUS
1000.0000 mL | Freq: Once | INTRAVENOUS | Status: AC
  Administered 2023-02-18: 1000 mL via INTRAVENOUS

## 2023-02-18 MED ORDER — ONDANSETRON HCL 4 MG/2ML IJ SOLN
4.0000 mg | Freq: Once | INTRAMUSCULAR | Status: AC
  Administered 2023-02-18: 4 mg via INTRAVENOUS
  Filled 2023-02-18: qty 2

## 2023-02-18 NOTE — ED Triage Notes (Signed)
Pt here with reports of abdominal pain onset today. Pain RUQ, radiates to back. Tender to palpation. Hx pancreatitis. States this feels the same. Pt drinks daily. Last drink this morning.

## 2023-02-18 NOTE — Assessment & Plan Note (Addendum)
Drains 2-4 16 to 20 ounce drinks daily with last drink being 5/25.  History of withdrawal without seizures. -CIWA protocol -TOC consult for alcohol use -Folic acid and thiamine IV daily -Initiate multivitamin when able to take p.o. medications

## 2023-02-18 NOTE — ED Provider Notes (Signed)
[place] EMERGENCY DEPARTMENT AT [facility] Provider Note   CSN: [id number] Arrival date & time: 02/18/23  1710     History {Add pertinent medical, surgical, social history, OB history to HPI:1} Chief Complaint  Patient presents with   Abdominal Pain    Gail Blackburn is a 47 y.o. female.  Patient presents to the emergency department complaining of abdominal pain.  Patient complains of pain in the right upper quadrant which radiates to her back.  She rates the pain at 7 out of 10 in severity.  She endorses nausea with no vomiting.  She states she has a history of pancreatitis and this feels similar to previous episodes.  She does endorse drinking alcohol daily with her last drink being this morning.  She denies emesis, chest pain, shortness of breath, fevers, diarrhea, urinary symptoms.  Past medical history significant for pancreatitis, alcohol use disorder, fibromyalgia, anxiety, depression, splenic vein thrombosis  HPI     Home Medications Prior to Admission medications   Medication Sig Start Date End Date Taking? Authorizing Provider  ALPRAZolam Prudy Feeler) 0.5 MG tablet Take 0.5 mg by mouth 4 (four) times daily. 10/03/21   [provider]  botulinum toxin Type A (BOTOX) 200 units injection Provider to inject 155 units into the muscles of the head and neck every 12 weeks. Discard remainder. 10/10/22   Anson Fret, MD  buPROPion (WELLBUTRIN SR) 150 MG 12 hr tablet Take 150 mg by mouth in the morning.    [provider]  cyclobenzaprine (FLEXERIL) 10 MG tablet TAKE 1 TABLET THREE TIMES DAILY AS NEEDED FOR MUSCLE SPASMS. DON'T TAKE WITH GABAPENTIN OR XANAX. CAUSES SEDATION. 02/16/23   Anson Fret, MD  JENCYCLA 0.35 MG tablet Take 1 tablet by mouth daily.    [provider]  ondansetron (ZOFRAN) 4 MG tablet TAKE 1 TABLET BY MOUTH EVERY 6 HOURS AS NEEDED FOR NAUSEA FOR VOMITING Patient taking differently: Take 4 mg by mouth every 6 (six) hours as  needed for nausea or vomiting. 08/04/21   Lomax, Amy, NP  propranolol ER (INDERAL LA) 60 MG 24 hr capsule Take 1 capsule (60 mg total) by mouth daily. 10/13/22   Anson Fret, MD  REXULTI 2 MG TABS tablet Take 2 mg by mouth daily. 09/08/22   [provider]  Rimegepant Sulfate (NURTEC) 75 MG TBDP Take 75 mg by mouth daily as needed. For migraines. Take as close to onset of migraine as possible. One daily maximum. 10/11/22   Anson Fret, MD  rizatriptan (MAXALT-MLT) 10 MG disintegrating tablet 1 TABLET FOR MIGRAINE. MAY REPEAT IN 2 HOURS. Patient taking differently: Take 10 mg by mouth as needed for migraine. 1 TABLET FOR MIGRAINE. MAY REPEAT IN 2 HOURS. 03/21/22   Lomax, Amy, NP  SUBOXONE 8-2 MG FILM Place 1 Film under the tongue in the morning and at bedtime.    [provider]  venlafaxine XR (EFFEXOR-XR) 75 MG 24 hr capsule Take 1 capsule (75 mg total) by mouth daily with breakfast. 11/10/20   Mirian Mo, MD      Allergies    Latex and Tape    Review of Systems   Review of Systems  Physical Exam Updated Vital Signs BP 113/82 (BP Location: Right Arm)   Pulse 85   Temp 97.6 F (36.4 C) (Oral)   Resp 19   SpO2 100%  Physical Exam Vitals and nursing note reviewed.  Constitutional:      General: She is  not in acute distress.    Appearance: She is well-developed.  HENT:     Head: Normocephalic and atraumatic.  Eyes:     Conjunctiva/sclera: Conjunctivae normal.  Cardiovascular:     Rate and Rhythm: Normal rate and regular rhythm.     Heart sounds: No murmur heard. Pulmonary:     Effort: Pulmonary effort is normal. No respiratory distress.     Breath sounds: Normal breath sounds.  Abdominal:     Palpations: Abdomen is soft.     Tenderness: There is abdominal tenderness in the right upper quadrant and epigastric area.  Musculoskeletal:        General: No swelling.     Cervical back: Neck supple.  Skin:    General: Skin is warm and dry.     Capillary  Refill: Capillary refill takes less than 2 seconds.  Neurological:     Mental Status: She is alert.  Psychiatric:        Mood and Affect: Mood normal.     ED Results / Procedures / Treatments   Labs (all labs ordered are listed, but only abnormal results are displayed) Labs Reviewed  LIPASE, BLOOD - Abnormal; Notable for the following components:      Result Value   Lipase 145 (*)    All other components within normal limits  COMPREHENSIVE METABOLIC PANEL - Abnormal; Notable for the following components:   Sodium 132 (*)    Glucose, Bld 106 (*)    Total Protein 6.1 (*)    Total Bilirubin 1.7 (*)    All other components within normal limits  CBC - Abnormal; Notable for the following components:   WBC 10.6 (*)    RBC 3.74 (*)    All other components within normal limits  I-STAT BETA HCG BLOOD, ED ([facility], [facility], [facility] ONLY)    EKG None  Radiology No results found.  Procedures Procedures  {Document cardiac monitor, telemetry assessment procedure when appropriate:1}  Medications Ordered in ED Medications  morphine (PF) 4 MG/ML injection 4 mg (4 mg Intravenous Given 02/18/23 2108)  ondansetron (ZOFRAN) injection 4 mg (4 mg Intravenous Given 02/18/23 2108)  sodium chloride 0.9 % bolus 1,000 mL (1,000 mLs Intravenous New Bag/Given 02/18/23 2107)  iohexol (OMNIPAQUE) 350 MG/ML injection 75 mL (75 mLs Intravenous Contrast Given 02/18/23 2230)    ED Course/ Medical Decision Making/ A&P   {   Click here for ABCD2, HEART and other calculatorsREFRESH Note before signing :1}                          Medical Decision Making Amount and/or Complexity of Data Reviewed Labs: ordered. Radiology: ordered.  Risk Prescription drug management.   This patient presents to the ED for concern of abdominal pain, this involves an extensive number of treatment options, and is a complaint that carries with it a high risk of complications and morbidity.  The differential diagnosis includes  pancreatitis, cholecystitis, appendicitis, colitis, gastritis, others   Co morbidities that complicate the patient evaluation  Alcohol abuse, history of pancreatitis   Additional history obtained:   External records from outside source obtained and reviewed including imaging results from December of this year when patient had both right upper quadrant ultrasound and CT abdomen pelvis with contrast.  Imaging showed acute appendicitis   Lab Tests:  I Ordered, and personally interpreted labs.  The pertinent results include: Lipase 145, WBC 10.6, grossly unremarkable CMP, negative pregnancy test  Imaging Studies ordered:  I ordered imaging studies including CT abdomen pelvis with contrast I independently visualized and interpreted imaging which showed *** I agree with the radiologist interpretation   Cardiac Monitoring: / EKG:  The patient was maintained on a cardiac monitor.  I personally viewed and interpreted the cardiac monitored which showed an underlying rhythm of: ***   Consultations Obtained:  I requested consultation with the family medicine service,  and discussed lab and imaging findings as well as pertinent plan - they recommend: ***   Problem List / ED Course / Critical interventions / Medication management   I ordered medication including morphine for pain, Zofran for nausea, saline bolus for fluid resuscitation Reevaluation of the patient after these medicines showed that the patient improved I have reviewed the patients home medicines and have made adjustments as needed   Social Determinants of Health:  Patient has Medicaid for her primary health insurance   Test / Admission - Considered:  Patient is acute pancreatitis.  Patient would benefit from admission for further IV pain control, antiemetics, and fluid administration.   {Document critical care time when appropriate:1} {Document review of labs and clinical decision tools ie heart score,  Chads2Vasc2 etc:1}  {Document your independent review of radiology images, and any outside records:1} {Document your discussion with family members, caretakers, and with consultants:1} {Document social determinants of health affecting pt's care:1} {Document your decision making why or why not admission, treatments were needed:1} Final Clinical Impression(s) / ED Diagnoses Final diagnoses:  Alcohol-induced acute pancreatitis, unspecified complication status    Rx / DC Orders ED Discharge Orders     None

## 2023-02-18 NOTE — H&P (Signed)
Hospital Admission History and Physical Service Pager: [phone]  Patient name: Tammala Diemert Medical record number: [id number] Date of Birth: 1975/11/22 Age: 47 y.o. Gender: female  Primary Care Provider: Sabino Dick, DO Consultants: None Code Status: Full which was confirmed by patient Preferred Emergency Contact:  Contact Information     Name Relation Home Work [place] Other [phone]        Chief Complaint: Nausea/abdominal pain  Assessment and Plan: Samamtha Yauch is a 47 y.o. female presenting with nausea and abdominal pain. Differential for this patient's presentation of this includes pancreatitis 2/2 alcohol use, gallstone pancreatitis, viral gastroenteritis, pregnancy, cholecystitis, gastric ulcer, constipation.  History, lipase and CT findings all correlated with pancreatitis secondary to alcohol use.  Other lab workup CMP and CBC reflective of electrolyte deficiency and malnutrition without signs of infection or kidney injury.  Pregnancy test negative.   * Acute pancreatitis Lipase 145 with CT findings compatible with acute pancreatitis in the setting of chronic alcohol use.  AST/ALT unremarkable.  Ranson criteria score of 0 is reassuring.  She is status post morphine, Zofran, 1 L NS bolus.  She would benefit from pain and antiemetic support in addition to fluids.  Most recent data supports moderate fluid resuscitation has better outcomes than aggressive fluid resuscitation but will adjust as needed. -Admit to FPTS, attending Dr. Lum Babe, progressive unit -LR 125 mL/h -Reglan every 6 hours as needed -IV Tylenol 1 g every 6 hours scheduled, IV Toradol every 8 hours as needed (attempting to avoid opioid analgesics at this time given history of OUD) -NPO, progress diet as tolerated -PT/OT eval and treat -Nursing order for heat pad to be applied to back as requested by patient  Alcohol use disorder, moderate, dependence (HCC) Drains 2-4 16 to 20 ounce  drinks daily with last drink being 5/25.  History of withdrawal without seizures. -CIWA protocol -TOC consult for alcohol use -Folic acid and thiamine IV daily -Initiate multivitamin when able to take p.o. medications   Chronic and stable conditions: OUD: Not on Suboxone at this time Bipolar disorder: Wellbutrin 150 mg daily, Effexor 75 mg daily, Xanax 4 times daily as needed Migraines: Propranolol 60 mg daily GERD: Brexpiprazole 2 mg daily  FEN/GI: N.p.o. VTE Prophylaxis: Lovenox  Disposition: Progressive  History of Present Illness:  Kendrick Caplin is a 47 y.o. female presenting with nausea and abdominal pain x 1 day.  Patient endorses having one drink today. She has been drinking "gas station drinks" 2-4 daily (16-20oz per drink) for the last few months. Endorses abdominal pain that radiates to her back that feels similar episode of pancreatitis. Feels bloated, pain 8/10, improved to 5/10 since receiving morphine. Notes heating pad helps for her back as well. This is her 3rd encounter of pancreatitis.   Notes her children just lost their dad and they begged her to come to the hospital due to how she was feeling.   Notes she went through alcohol withdrawal (hallucinations, delirium, no seizures) the first time she went through pancreatitis.   Denies further symptomatology such as dysuria, constipation, shortness of breath, chest pain.  In the ED, patient presented with abdominal tenderness to the right upper quadrant and epigastric region.  Received 1 L bolus NS, morphine 4 mg IV, Zofran 4 mg IV.  Lab work remarkable for lipase 145 with WBC 10.6 and negative pregnancy test.  Review Of Systems: Per HPI  Pertinent Past Medical History: Migraines  OUD on suboxone (off suboxone and opioids  for over 1 year) Alcohol use disorder, prior withdrawal w/o seizures Bipolar disorder Splenic vein thrombosis  (2022)  Remainder reviewed in history tab.   Pertinent Past Surgical  History: Colonoscopy Remainder reviewed in history tab.  Pertinent Social History: Tobacco use: Yes (5 cigarettes per day) Alcohol use: yes Other Substance use: cannabis daily, opioid use history (denies current opioid use) Lives with mother  Pertinent Family History: Alcohol use disorder, bipolar in mother, heart disease in mother, 2 aunts with GB disease requiring removal Remainder reviewed in history tab.   Important Outpatient Medications: Xanax 0.5 mg, takes 4 times a day - [facility]  Wellbutrin 150 mg daily Effexor 75 mg daily Brexpiprazole 2 mg daily Propranolol ER 60 mg daily Nurtec 75 mg as needed Rizatriptan 10 mg as needed Remainder reviewed in medication history.   Objective: BP 113/82 (BP Location: Right Arm)   Pulse 85   Temp 97.6 F (36.4 C) (Oral)   Resp 19   SpO2 100%  Exam: General: Well-appearing, NAD Cardiovascular: RRR, murmurs auscultated Respiratory: CTAB, normal WOB Gastrointestinal: Soft, nondistended, tenderness to the epigastric and right upper quadrant region, no hepatomegaly MSK: Moves all 4 extremities appropriately, no CVA tenderness, no pitting edema of BLEs Derm: No rashes appreciable Neuro: A&O x 4 Psych: Normal mood and affect  Labs:  CBC BMET  Recent Labs  Lab 02/18/23 1737  WBC 10.6*  HGB 12.3  HCT 36.6  PLT 254   Recent Labs  Lab 02/18/23 1737  NA 132*  K 4.3  CL 99  CO2 22  BUN 9  CREATININE 0.93  GLUCOSE 106*  CALCIUM 9.0     Lipase 145 Beta-hCG less than 5  EKG: Not applicable  Imaging Studies Performed: CT abdomen pelvis with contrast: Peripancreatic fat stranding, edema, and small amount of free fluid without pancreatic ductal dilatation compatible with acute pancreatitis  Shelby Mattocks, DO 02/19/2023, 12:03 AM PGY-2, [facility]  FPTS Intern pager: [phone], text pages welcome Secure chat group [facility] Teaching Service

## 2023-02-18 NOTE — Assessment & Plan Note (Addendum)
Tolerating regular diet. PT/OT signed off -LR 125 mL/h -Reglan q6h prn -Pain management: Tylenol 1g q6h, Toradol 30 q8h prn, Oxycodone 5mg  q6h prn

## 2023-02-19 DIAGNOSIS — F1020 Alcohol dependence, uncomplicated: Secondary | ICD-10-CM

## 2023-02-19 DIAGNOSIS — Z634 Disappearance and death of family member: Secondary | ICD-10-CM | POA: Diagnosis not present

## 2023-02-19 DIAGNOSIS — K219 Gastro-esophageal reflux disease without esophagitis: Secondary | ICD-10-CM | POA: Diagnosis not present

## 2023-02-19 DIAGNOSIS — Z86718 Personal history of other venous thrombosis and embolism: Secondary | ICD-10-CM | POA: Diagnosis not present

## 2023-02-19 DIAGNOSIS — R1013 Epigastric pain: Secondary | ICD-10-CM | POA: Diagnosis not present

## 2023-02-19 DIAGNOSIS — Z87891 Personal history of nicotine dependence: Secondary | ICD-10-CM | POA: Diagnosis not present

## 2023-02-19 DIAGNOSIS — F419 Anxiety disorder, unspecified: Secondary | ICD-10-CM | POA: Diagnosis not present

## 2023-02-19 DIAGNOSIS — K859 Acute pancreatitis without necrosis or infection, unspecified: Secondary | ICD-10-CM | POA: Diagnosis present

## 2023-02-19 DIAGNOSIS — F319 Bipolar disorder, unspecified: Secondary | ICD-10-CM | POA: Diagnosis not present

## 2023-02-19 DIAGNOSIS — Z79899 Other long term (current) drug therapy: Secondary | ICD-10-CM | POA: Diagnosis not present

## 2023-02-19 DIAGNOSIS — K852 Alcohol induced acute pancreatitis without necrosis or infection: Principal | ICD-10-CM

## 2023-02-19 DIAGNOSIS — R109 Unspecified abdominal pain: Secondary | ICD-10-CM | POA: Diagnosis not present

## 2023-02-19 DIAGNOSIS — E46 Unspecified protein-calorie malnutrition: Secondary | ICD-10-CM | POA: Diagnosis not present

## 2023-02-19 DIAGNOSIS — Z6821 Body mass index (BMI) 21.0-21.9, adult: Secondary | ICD-10-CM | POA: Diagnosis not present

## 2023-02-19 DIAGNOSIS — Z91048 Other nonmedicinal substance allergy status: Secondary | ICD-10-CM | POA: Diagnosis not present

## 2023-02-19 DIAGNOSIS — F1290 Cannabis use, unspecified, uncomplicated: Secondary | ICD-10-CM | POA: Diagnosis present

## 2023-02-19 DIAGNOSIS — Z91040 Latex allergy status: Secondary | ICD-10-CM | POA: Diagnosis not present

## 2023-02-19 DIAGNOSIS — M797 Fibromyalgia: Secondary | ICD-10-CM | POA: Diagnosis not present

## 2023-02-19 LAB — COMPREHENSIVE METABOLIC PANEL
ALT: 15 U/L (ref 0–44)
AST: 17 U/L (ref 15–41)
Albumin: 3.3 g/dL — ABNORMAL LOW (ref 3.5–5.0)
Alkaline Phosphatase: 39 U/L (ref 38–126)
Anion gap: 6 (ref 5–15)
BUN: 6 mg/dL (ref 6–20)
CO2: 23 mmol/L (ref 22–32)
Calcium: 8.1 mg/dL — ABNORMAL LOW (ref 8.9–10.3)
Chloride: 106 mmol/L (ref 98–111)
Creatinine, Ser: 0.77 mg/dL (ref 0.44–1.00)
GFR, Estimated: >60 mL/min (ref >60)
Glucose, Bld: 108 mg/dL — ABNORMAL HIGH (ref 70–99)
Potassium: 3.8 mmol/L (ref 3.5–5.1)
Sodium: 135 mmol/L (ref 135–145)
Total Bilirubin: 1 mg/dL (ref 0.3–1.2)
Total Protein: 5.3 g/dL — ABNORMAL LOW (ref 6.5–8.1)

## 2023-02-19 LAB — RAPID URINE DRUG SCREEN, HOSP PERFORMED
Amphetamines: NOT DETECTED
Barbiturates: NOT DETECTED
Benzodiazepines: POSITIVE — AB
Cocaine: NOT DETECTED
Opiates: POSITIVE — AB
Tetrahydrocannabinol: POSITIVE — AB

## 2023-02-19 MED ORDER — THIAMINE HCL 100 MG/ML IJ SOLN
100.0000 mg | Freq: Every day | INTRAMUSCULAR | Status: DC
  Administered 2023-02-19 – 2023-02-20 (×2): 100 mg via INTRAVENOUS
  Filled 2023-02-19 (×2): qty 2

## 2023-02-19 MED ORDER — OXYCODONE HCL 5 MG PO TABS
5.0000 mg | ORAL_TABLET | Freq: Four times a day (QID) | ORAL | Status: DC | PRN
  Administered 2023-02-19 – 2023-02-21 (×8): 5 mg via ORAL
  Filled 2023-02-19 (×8): qty 1

## 2023-02-19 MED ORDER — VENLAFAXINE HCL ER 75 MG PO CP24
75.0000 mg | ORAL_CAPSULE | Freq: Every day | ORAL | Status: DC
  Administered 2023-02-19 – 2023-02-21 (×3): 75 mg via ORAL
  Filled 2023-02-19 (×3): qty 1

## 2023-02-19 MED ORDER — ACETAMINOPHEN 10 MG/ML IV SOLN
1000.0000 mg | Freq: Four times a day (QID) | INTRAVENOUS | Status: AC
  Administered 2023-02-19 (×4): 1000 mg via INTRAVENOUS
  Filled 2023-02-19 (×4): qty 100

## 2023-02-19 MED ORDER — PROPRANOLOL HCL ER 60 MG PO CP24
60.0000 mg | ORAL_CAPSULE | Freq: Every day | ORAL | Status: DC
  Administered 2023-02-19 – 2023-02-21 (×3): 60 mg via ORAL
  Filled 2023-02-19 (×3): qty 1

## 2023-02-19 MED ORDER — KETOROLAC TROMETHAMINE 30 MG/ML IJ SOLN
30.0000 mg | Freq: Three times a day (TID) | INTRAMUSCULAR | Status: DC | PRN
  Administered 2023-02-19 – 2023-02-20 (×4): 30 mg via INTRAVENOUS
  Filled 2023-02-19 (×4): qty 1

## 2023-02-19 MED ORDER — ACETAMINOPHEN 500 MG PO TABS
1000.0000 mg | ORAL_TABLET | Freq: Four times a day (QID) | ORAL | Status: DC
  Administered 2023-02-19 – 2023-02-21 (×6): 1000 mg via ORAL
  Filled 2023-02-19 (×6): qty 2

## 2023-02-19 MED ORDER — BUPROPION HCL ER (SR) 150 MG PO TB12
150.0000 mg | ORAL_TABLET | Freq: Every morning | ORAL | Status: DC
  Administered 2023-02-19 – 2023-02-21 (×3): 150 mg via ORAL
  Filled 2023-02-19 (×3): qty 1

## 2023-02-19 MED ORDER — ALPRAZOLAM 0.5 MG PO TABS
0.5000 mg | ORAL_TABLET | Freq: Four times a day (QID) | ORAL | Status: DC
  Administered 2023-02-19 – 2023-02-21 (×10): 0.5 mg via ORAL
  Filled 2023-02-19 (×7): qty 1
  Filled 2023-02-19: qty 2
  Filled 2023-02-19 (×2): qty 1

## 2023-02-19 MED ORDER — ENOXAPARIN SODIUM 40 MG/0.4ML IJ SOSY
40.0000 mg | PREFILLED_SYRINGE | INTRAMUSCULAR | Status: DC
  Administered 2023-02-19 – 2023-02-20 (×2): 40 mg via SUBCUTANEOUS
  Filled 2023-02-19 (×2): qty 0.4

## 2023-02-19 MED ORDER — FOLIC ACID 5 MG/ML IJ SOLN
1.0000 mg | Freq: Every day | INTRAMUSCULAR | Status: DC
  Administered 2023-02-19: 1 mg via INTRAVENOUS
  Filled 2023-02-19 (×5): qty 0.2

## 2023-02-19 MED ORDER — METOCLOPRAMIDE HCL 5 MG/ML IJ SOLN
10.0000 mg | Freq: Four times a day (QID) | INTRAMUSCULAR | Status: DC | PRN
  Administered 2023-02-19 – 2023-02-21 (×2): 10 mg via INTRAVENOUS
  Filled 2023-02-19 (×2): qty 2

## 2023-02-19 MED ORDER — BREXPIPRAZOLE 2 MG PO TABS
2.0000 mg | ORAL_TABLET | Freq: Every day | ORAL | Status: DC
  Administered 2023-02-19 – 2023-02-21 (×3): 2 mg via ORAL
  Filled 2023-02-19 (×3): qty 1

## 2023-02-19 MED ORDER — LACTATED RINGERS IV SOLN: INTRAVENOUS | Status: AC

## 2023-02-19 NOTE — Evaluation (Signed)
Physical Therapy Evaluation Patient Details Name: Gail Blackburn MRN: [id number] DOB: 04-03-1976 Today's Date: 02/19/2023  History of Present Illness  Pt is 47 y.o. female presenting with nausea and abdominal pain, found to have acute pancreatitis (likely due to alcohol use).  PMH/PSH includes alcohol use disorder, opioid use disorder, bipolar disorder, migraines, GERD, h/o pancreatitis.   Clinical Impression  Gail Blackburn is 47 y.o. female admitted with above HPI and diagnosis. Patient is currently limited by functional impairments below (see PT problem list). Patient lives with parents and is independent at baseline. Patient evaluated by Physical Therapy with no further acute PT needs identified. All education has been completed and the patient has no further questions. Pt mobilizing at supervision to mod I level with gait and transfers. See below for any follow-up Physical Therapy or equipment needs. PT is signing off. Thank you for this referral.        Recommendations for follow up therapy are one component of a multi-disciplinary discharge planning process, led by the attending physician.  Recommendations may be updated based on patient status, additional functional criteria and insurance authorization.  Follow Up Recommendations       Assistance Recommended at Discharge PRN  Patient can return home with the following       Equipment Recommendations None recommended by PT  Recommendations for Other Services       Functional Status Assessment Patient has had a recent decline in their functional status and demonstrates the ability to make significant improvements in function in a reasonable and predictable amount of time.     Precautions / Restrictions Precautions Precautions: Fall Restrictions Weight Bearing Restrictions: No      Mobility  Bed Mobility Overal bed mobility: Independent                  Transfers Overall transfer level: Independent                  General transfer comment: use of UE's for rise and lower    Ambulation/Gait Ambulation/Gait assistance: Supervision Gait Distance (Feet): 400 Feet Assistive device: IV Pole, None Gait Pattern/deviations: WFL(Within Functional Limits) Gait velocity: fair     General Gait Details: no tremors throughout gait, steady and cautious pace. pt able to safely manage IV pole with cues from therapist.  Stairs            Wheelchair Mobility    Modified Rankin (Stroke Patients Only)       Balance Overall balance assessment: Mild deficits observed, not formally tested   Sitting balance-Leahy Scale: Normal       Standing balance-Leahy Scale: Good   Single Leg Stance - Right Leg: 10 Single Leg Stance - Left Leg: 15                         Pertinent Vitals/Pain Pain Assessment Pain Assessment: Faces Faces Pain Scale: Hurts a little bit Pain Location: abdomen Pain Descriptors / Indicators: Discomfort Pain Intervention(s): Limited activity within patient's tolerance, Monitored during session, Repositioned    Home Living Family/patient expects to be discharged to:: Private residence Living Arrangements: Parent Available Help at Discharge: Family Type of Home: House Home Access: Stairs to enter   Secretary/administrator of Steps: 4   Home Layout: One level Home Equipment: None Additional Comments: pt lives with parents, father has end stage Alzheimer's and she assists her mom in caring for him. pt has 2 adult children college age.  Prior Function Prior Level of Function : Independent/Modified Independent                     Hand Dominance        Extremity/Trunk Assessment   Upper Extremity Assessment Upper Extremity Assessment: Overall WFL for tasks assessed    Lower Extremity Assessment Lower Extremity Assessment: Overall WFL for tasks assessed    Cervical / Trunk Assessment Cervical / Trunk Assessment: Normal  Communication    Communication: No difficulties  Cognition Arousal/Alertness: Awake/alert Behavior During Therapy: WFL for tasks assessed/performed Overall Cognitive Status: Within Functional Limits for tasks assessed                                          General Comments      Exercises     Assessment/Plan    PT Assessment Patient does not need any further PT services  PT Problem List Decreased balance;Decreased mobility       PT Treatment Interventions DME instruction;Gait training;Stair training;Functional mobility training;Therapeutic activities;Therapeutic exercise;Balance training;Neuromuscular re-education;Cognitive remediation;Patient/family education    PT Goals (Current goals can be found in the Care Plan section)  Acute Rehab PT Goals Patient Stated Goal: take better care of health PT Goal Formulation: All assessment and education complete, DC therapy Time For Goal Achievement: 02/26/23 Potential to Achieve Goals: Good    Frequency  (1x eval)     Co-evaluation               AM-PAC PT "6 Clicks" Mobility  Outcome Measure Help needed turning from your back to your side while in a flat bed without using bedrails?: None Help needed moving from lying on your back to sitting on the side of a flat bed without using bedrails?: None Help needed moving to and from a bed to a chair (including a wheelchair)?: None Help needed standing up from a chair using your arms (e.g., wheelchair or bedside chair)?: None Help needed to walk in hospital room?: A Little Help needed climbing 3-5 steps with a railing? : A Little 6 Click Score: 22    End of Session Equipment Utilized During Treatment: Gait belt Activity Tolerance: Patient tolerated treatment well Patient left: in bed;with call bell/phone within reach Nurse Communication: Mobility status PT Visit Diagnosis: Unsteadiness on feet (R26.81)    Time: 4782-9562 PT Time Calculation (min) (ACUTE ONLY): 13  min   Charges:   PT Evaluation $PT Eval Low Complexity: 1 Low          Wynn Maudlin, DPT Acute Rehabilitation Services Office [phone]  02/19/23 11:54 AM

## 2023-02-19 NOTE — Progress Notes (Signed)
OT Cancellation Note  Patient Details Name: Gail Blackburn MRN: [id number] DOB: 1976/05/30   Cancelled Treatment:    Reason Eval/Treat Not Completed: OT screened, no needs identified, will sign off  Evern Bio 02/19/2023, 12:08 PM Berna Spare, OTR/L Acute Rehabilitation Services Office: [phone]

## 2023-02-19 NOTE — Progress Notes (Signed)
Patient received from ED, CHG bath completed. Connected to tele and CCMD notified. Oriented patient to room and call bell system. Call bell within reach. Will continue to monitor.

## 2023-02-19 NOTE — Progress Notes (Incomplete)
     Daily Progress Note Intern Pager: [phone]  Patient name: Gail Blackburn Medical record number: [id number] Date of birth: 1976/04/12 Age: 47 y.o. Gender: female  Primary Care Provider: Sabino Dick, DO Consultants: None Code Status: Full code  Pt Overview and Major Events to Date:  5/26: Admitted to FMTS  Assessment and Plan: Gail Blackburn is a 47 y.o. female presenting with nausea and abdominal pain, found to have acute pancreatitis (likely due to alcohol use). PMH/PSH includes alcohol use disorder, opioid use disorder, bipolar disorder, migraines, GERD, h/o pancreatitis.  * Acute pancreatitis Tolerating regular diet. PT/OT signed off -LR 125 mL/h -Reglan q6h prn -Pain management: Tylenol 1g q6h, Toradol 30 q8h prn, Oxycodone 5mg  q6h prn  Alcohol use disorder, moderate, dependence (HCC) CIWA stable. On Xanax 0.5mg  QID at home. -CIWA -Folate/thiamine/multivitamin    FEN/GI: Regular PPx: Lovenox Dispo: Home pending clinical improvement  Subjective:  ***  Objective: Temp:  [97.6 F (36.4 C)-98.4 F (36.9 C)] 98.2 F (36.8 C) (05/26 1951) Pulse Rate:  [67-80] 80 (05/26 1951) Resp:  [12-23] 14 (05/26 1951) BP: (104-127)/(78-84) 116/80 (05/26 1951) SpO2:  [97 %-100 %] 98 % (05/26 1951) Weight:  [58.9 kg] 58.9 kg (05/26 0227) Physical Exam: General: *** Cardiovascular: *** Respiratory: *** Abdomen: *** Extremities: ***  Laboratory: Most recent CBC Lab Results  Component Value Date   WBC 10.6 (H) 02/18/2023   HGB 12.3 02/18/2023   HCT 36.6 02/18/2023   MCV 97.9 02/18/2023   PLT 254 02/18/2023   Most recent BMP    Latest Ref Rng & Units 02/19/2023    1:53 AM  BMP  Glucose 70 - 99 mg/dL 147   BUN 6 - 20 mg/dL 6   Creatinine 8.29 - 5.62 mg/dL 1.30   Sodium 865 - 784 mmol/L 135   Potassium 3.5 - 5.1 mmol/L 3.8   Chloride 98 - 111 mmol/L 106   CO2 22 - 32 mmol/L 23   Calcium 8.9 - 10.3 mg/dL 8.1     Other pertinent labs: UDS: +THC Lipase:  145  Imaging/Diagnostic Tests: CT ABDOMEN PELVIS W CONTRAST Result Date: 02/18/2023 IMPRESSION: Findings compatible with acute pancreatitis.   Elberta Fortis, MD 02/19/2023, 9:38 PM  PGY-1, [facility] FPTS Intern pager: [phone], text pages welcome Secure chat group [facility] Teaching Service

## 2023-02-19 NOTE — Plan of Care (Signed)

## 2023-02-19 NOTE — Hospital Course (Signed)
Gail Blackburn is a 47 y.o.female with a history of alcohol use, opioid use disoder, hx alcohol withdrawal, hx pancreatitis who was admitted to the [facility] at [facility] for acute pancreatitis. Her hospital course is detailed below:  Acute Pancreatitis Presented on 5/26 with abdominal pain and nausea. Lipase elevated to 145. CT abdomen/pelvis showed acute pancreatitis. Likely secondary to alcohol use. Treated with IV fluids and pain management (initially Tylenol and Toradol, but ultimately required oxycodone 5 mg q6h). Diet was advanced as tolerated, and at time of discharge she ***  Alcohol Use Disorder Patient reported   Other chronic conditions were medically managed with home medications and formulary alternatives as necessary (***)  PCP Follow-up Recommendations:

## 2023-02-19 NOTE — ED Notes (Signed)
ED TO INPATIENT HANDOFF REPORT  ED Nurse Name and Phone #:  Johnna Acosta [phone]  S Name/Age/Gender Gail Blackburn 47 y.o. female Room/Bed: 027C/027C  Code Status   Code Status: Full Code  Home/SNF/Other Home Patient oriented to: self, place, time, and situation Is this baseline? Yes   Triage Complete: Triage complete  Chief Complaint Pancreatitis, acute [K85.90]  Triage Note Pt here with reports of abdominal pain onset today. Pain RUQ, radiates to back. Tender to palpation. Hx pancreatitis. States this feels the same. Pt drinks daily. Last drink this morning.    Allergies Allergies  Allergen Reactions   Latex Rash and Other (See Comments)    Only in vaginal area   Tape Rash and Other (See Comments)    Rashes occur only if left on for a long time    Level of Care/Admitting Diagnosis ED Disposition     ED Disposition  Admit   Condition  --   Comment  Hospital Area: [facility] [[id number]]  Level of Care: Progressive [102]  Admit to Progressive based on following criteria: GI, ENDOCRINE disease patients with GI bleeding, acute liver failure or pancreatitis, stable with diabetic ketoacidosis or thyrotoxicosis (hypothyroid) state.  May place patient in observation at [facility] or [facility] if equivalent level of care is available:: No  Covid Evaluation: Asymptomatic - no recent exposure (last 10 days) testing not required  Diagnosis: Pancreatitis, acute [147829]  Admitting Physician: Shelby Mattocks [[id number]]  Attending Physician: Doreene Eland [[id number]]          B Medical/Surgery History Past Medical History:  Diagnosis Date   Anemia    PRIOR HISTORY   Anxiety    Asthma    Bipolar disorder (HCC)    Borderline   Depression    Fibromyalgia    Genital herpes simplex    Headache    MIGRAINES   Hepatic lesion 2022   interminate   Hypothyroidism 2009   TOOK MEDS FOR FEW WEEKS NO MEDS NOW   Migraine    Pancreatitis    Splenic vein  thrombosis    Thyroid disease    treated in past not sure if too high or too low   Past Surgical History:  Procedure Laterality Date   COLONOSCOPY     DILATION AND EVACUATION N/A 05/06/2016   Procedure: DILATATION AND EVACUATION;  Surgeon: Huel Cote, MD;  Location: [facility];  Service: Gynecology;  Laterality: N/A;   FOOT SURGERY     TEETH PULLED  03/2016   WISDOM TOOTH EXTRACTION       A IV Location/Drains/Wounds Patient Lines/Drains/Airways Status     Active Line/Drains/Airways     Name Placement date Placement time Site Days   Peripheral IV 02/18/23 20 G Left Forearm 02/18/23  2102  Forearm  1            Intake/Output Last 24 hours  Intake/Output Summary (Last 24 hours) at 02/19/2023 0036 Last data filed at 02/18/2023 2215 Gross per 24 hour  Intake 1000 ml  Output --  Net 1000 ml    Labs/Imaging Results for orders placed or performed during the hospital encounter of 02/18/23 (from the past 48 hour(s))  Lipase, blood     Status: Abnormal   Collection Time: 02/18/23  5:37 PM  Result Value Ref Range   Lipase 145 (H) 11 - 51 U/L    Comment: HEMOLYSIS AT THIS LEVEL MAY AFFECT RESULT Performed at [facility], [address], [place],  [place] [address]   Comprehensive metabolic panel     Status: Abnormal   Collection Time: 02/18/23  5:37 PM  Result Value Ref Range   Sodium 132 (L) 135 - 145 mmol/L   Potassium 4.3 3.5 - 5.1 mmol/L    Comment: HEMOLYSIS AT THIS LEVEL MAY AFFECT RESULT   Chloride 99 98 - 111 mmol/L   CO2 22 22 - 32 mmol/L   Glucose, Bld 106 (H) 70 - 99 mg/dL    Comment: Glucose reference range applies only to samples taken after fasting for at least 8 hours.   BUN 9 6 - 20 mg/dL   Creatinine, Ser 6.04 0.44 - 1.00 mg/dL   Calcium 9.0 8.9 - 54.0 mg/dL   Total Protein 6.1 (L) 6.5 - 8.1 g/dL   Albumin 4.2 3.5 - 5.0 g/dL   AST 40 15 - 41 U/L    Comment: HEMOLYSIS AT THIS LEVEL MAY AFFECT RESULT   ALT 10 0 - 44 U/L    Comment: HEMOLYSIS AT  THIS LEVEL MAY AFFECT RESULT   Alkaline Phosphatase 47 38 - 126 U/L   Total Bilirubin 1.7 (H) 0.3 - 1.2 mg/dL    Comment: HEMOLYSIS AT THIS LEVEL MAY AFFECT RESULT   GFR, Estimated >60 >60 mL/min    Comment: (NOTE) Calculated using the CKD-EPI Creatinine Equation (2021)    Anion gap 11 5 - 15    Comment: Performed at [facility], [address] [place], [place] [address]  CBC     Status: Abnormal   Collection Time: 02/18/23  5:37 PM  Result Value Ref Range   WBC 10.6 (H) 4.0 - 10.5 K/uL   RBC 3.74 (L) 3.87 - 5.11 MIL/uL   Hemoglobin 12.3 12.0 - 15.0 g/dL   HCT 14.7 82.9 - 56.2 %   MCV 97.9 80.0 - 100.0 fL   MCH 32.9 26.0 - 34.0 pg   MCHC 33.6 30.0 - 36.0 g/dL   RDW 13.0 86.5 - 78.4 %   Platelets 254 150 - 400 K/uL   nRBC 0.0 0.0 - 0.2 %    Comment: Performed at [facility], [address] [place], [place] [address]  I-Stat beta hCG blood, ED ([facility], [facility], [facility] only)     Status: None   Collection Time: 02/18/23  5:42 PM  Result Value Ref Range   I-stat hCG, quantitative <5.0 <5 mIU/mL   Comment 3            Comment:   GEST. AGE      CONC.  (mIU/mL)   <=1 WEEK        5 - 50     2 WEEKS       50 - 500     3 WEEKS       100 - 10,000     4 WEEKS     1,000 - 30,000        FEMALE AND NON-PREGNANT FEMALE:     LESS THAN 5 mIU/mL    CT ABDOMEN PELVIS W CONTRAST  Result Date: 02/18/2023 CLINICAL DATA:  Pancreatitis, acute, severe. Right upper quadrant abdominal pain radiating to back. EXAM: CT ABDOMEN AND PELVIS WITH CONTRAST TECHNIQUE: Multidetector CT imaging of the abdomen and pelvis was performed using the standard protocol following bolus administration of intravenous contrast. RADIATION DOSE REDUCTION: This exam was performed according to the departmental dose-optimization program which includes automated exposure control, adjustment of the mA and/or kV according to patient size and/or  use of iterative reconstruction technique. CONTRAST:  75mL OMNIPAQUE IOHEXOL 350 MG/ML  SOLN COMPARISON:  09/24/2022. FINDINGS: Lower chest: Atelectasis is noted at the lung bases. Hepatobiliary: No focal liver abnormality is seen. Mild periportal edema is noted. No gallstones, gallbladder wall thickening, or biliary dilatation. Pancreas: There is peripancreatic fat stranding, edema, and a small amount of free fluid. No pancreatic ductal dilatation. Spleen: Normal in size without focal abnormality. Adrenals/Urinary Tract: The adrenal glands are within normal limits. The kidneys enhance symmetrically. No renal calculus or hydronephrosis. The bladder is unremarkable. Stomach/Bowel: Stomach is within normal limits. Appendix appears normal. No evidence of bowel wall thickening, distention, or inflammatory changes. No free air or pneumatosis. Vascular/Lymphatic: No significant vascular findings are present. Portal vein, superior mesenteric vein, and splenic vein are patent. No enlarged abdominal or pelvic lymph nodes. Reproductive: Uterus and bilateral adnexa are unremarkable. Other: Small amount of peripancreatic free fluid. Musculoskeletal: No acute osseous abnormality. IMPRESSION: Findings compatible with acute pancreatitis. Electronically Signed   By: Thornell Sartorius M.D.   On: 02/18/2023 22:47    Pending Labs Unresulted Labs (From admission, onward)     Start     Ordered   02/19/23 0500  Comprehensive metabolic panel  Tomorrow morning,   R        02/19/23 0002   02/18/23 2334  Rapid urine drug screen (hospital performed)  ONCE - STAT,   STAT        02/19/23 0002            Vitals/Pain Today's Vitals   02/18/23 1753 02/18/23 2107 02/18/23 2215 02/19/23 0000  BP:  113/82  108/78  Pulse:  85  67  Resp:  19  14  Temp:  97.6 F (36.4 C)  97.8 F (36.6 C)  TempSrc:  Oral  Oral  SpO2:  100%  99%  PainSc: 6  8  6  6      Isolation Precautions No active isolations  Medications Medications  propranolol ER (INDERAL LA) 24 hr capsule 60 mg (has no administration in time range)   buPROPion (WELLBUTRIN SR) 12 hr tablet 150 mg (has no administration in time range)  brexpiprazole (REXULTI) tablet 2 mg (has no administration in time range)  venlafaxine XR (EFFEXOR-XR) 24 hr capsule 75 mg (has no administration in time range)  enoxaparin (LOVENOX) injection 40 mg (has no administration in time range)  lactated ringers infusion ( Intravenous New Bag/Given 02/19/23 0027)  thiamine (VITAMIN B1) injection 100 mg (has no administration in time range)  metoCLOPramide (REGLAN) injection 10 mg (10 mg Intravenous Given 02/19/23 0029)  acetaminophen (OFIRMEV) IV 1,000 mg (has no administration in time range)  ketorolac (TORADOL) 30 MG/ML injection 30 mg (30 mg Intravenous Given 02/19/23 0028)  folic acid injection 1 mg (has no administration in time range)  morphine (PF) 4 MG/ML injection 4 mg (4 mg Intravenous Given 02/18/23 2108)  ondansetron (ZOFRAN) injection 4 mg (4 mg Intravenous Given 02/18/23 2108)  sodium chloride 0.9 % bolus 1,000 mL (0 mLs Intravenous Stopped 02/18/23 2215)  iohexol (OMNIPAQUE) 350 MG/ML injection 75 mL (75 mLs Intravenous Contrast Given 02/18/23 2230)    Mobility walks     Focused Assessments Pulmonary Assessment Handoff:  Lung sounds:   O2 Device: Room Air      R Recommendations: See Admitting Provider Note  Report given to:   Additional Notes:

## 2023-02-19 NOTE — Progress Notes (Addendum)
     Daily Progress Note Intern Pager: [phone]  Patient name: Gail Blackburn Medical record number: [id number] Date of birth: Mar 17, 1976 Age: 47 y.o. Gender: female  Primary Care Provider: Sabino Dick, DO Consultants: None Code Status: FULL  Pt Overview and Major Events to Date:  5/26: Admitted  Assessment and Plan: Gail Blackburn is a 47 y.o. female presenting with nausea and abdominal pain, found to have acute pancreatitis (likely due to alcohol use). PMH/PSH includes alcohol use disorder, opioid use disorder, bipolar disorder, migraines, GERD, h/o pancreatitis.  * Acute pancreatitis Stable. Will continue w/ IVF and pain/nausea control. No need for repeat RUQ [place] given normal [place] in December 2023, and symptoms consistent with alcohol induced pancreatitis - Advance diet as tolerated, start with CLD and advance to regular -LR 125 mL/h -Reglan every 6 hours as needed -IV Tylenol 1 g every 6 hours scheduled, IV Toradol every 8 hours as needed (attempting to avoid opioid analgesics at this time given history of OUD) -PT/OT eval and treat -Nursing order for heat pad to be applied to back as requested by patient  Alcohol use disorder, moderate, dependence (HCC) No signs of withdrawal this morning. CIWA 0. -CIWA protocol -TOC consult for alcohol use -Folic acid and thiamine IV daily    Chronic and stable conditions: OUD: Not on Suboxone at this time Bipolar disorder: Wellbutrin 150 mg daily, Effexor 75 mg daily, Xanax 4 times daily as needed Migraines: Propranolol 60 mg daily GERD: Brexpiprazole 2 mg daily   FEN/GI: ADAT, from NPO to regular PPx: Lovenox Dispo:Pending PT recommendations  pending clinical improvement . Barriers include pain control, ability to tolerate PO.   Subjective:  Patient reports abdominal pain this morning. She is hungry and would like to eat. No nausea or vomiting.  Objective: Temp:  [97.6 F (36.4 C)-98.1 F (36.7 C)] 98.1 F (36.7 C) (05/26  0756) Pulse Rate:  [67-91] 71 (05/26 0756) Resp:  [12-19] 12 (05/26 0756) BP: (108-127)/(78-85) 112/78 (05/26 0756) SpO2:  [97 %-100 %] 100 % (05/26 0756) Weight:  [58.9 kg] 58.9 kg (05/26 0227) Physical Exam: General: Pleasant, resting comfortable in bed Cardiovascular: RRR Respiratory: Normal WOB on room air, no wheezing or crackles Abdomen: Soft, tender in epigastrium and RUQ. No guarding  Extremities: Warm, well-perfused  Laboratory: Most recent CBC Lab Results  Component Value Date   WBC 10.6 (H) 02/18/2023   HGB 12.3 02/18/2023   HCT 36.6 02/18/2023   MCV 97.9 02/18/2023   PLT 254 02/18/2023   Most recent BMP    Latest Ref Rng & Units 02/19/2023    1:53 AM  BMP  Glucose 70 - 99 mg/dL 010   BUN 6 - 20 mg/dL 6   Creatinine 9.32 - 3.55 mg/dL 7.32   Sodium 202 - 542 mmol/L 135   Potassium 3.5 - 5.1 mmol/L 3.8   Chloride 98 - 111 mmol/L 106   CO2 22 - 32 mmol/L 23   Calcium 8.9 - 10.3 mg/dL 8.1      Darral Dash, DO 02/19/2023, 9:59 AM  PGY-2, [facility] FPTS Intern pager: [phone], text pages welcome Secure chat group [facility] Teaching Service

## 2023-02-20 DIAGNOSIS — K852 Alcohol induced acute pancreatitis without necrosis or infection: Secondary | ICD-10-CM | POA: Diagnosis not present

## 2023-02-20 LAB — BASIC METABOLIC PANEL
Anion gap: 5 (ref 5–15)
BUN: <5 mg/dL — ABNORMAL LOW (ref 6–20)
CO2: 26 mmol/L (ref 22–32)
Calcium: 8.1 mg/dL — ABNORMAL LOW (ref 8.9–10.3)
Chloride: 108 mmol/L (ref 98–111)
Creatinine, Ser: 0.69 mg/dL (ref 0.44–1.00)
GFR, Estimated: >60 mL/min (ref >60)
Glucose, Bld: 120 mg/dL — ABNORMAL HIGH (ref 70–99)
Potassium: 3.6 mmol/L (ref 3.5–5.1)
Sodium: 139 mmol/L (ref 135–145)

## 2023-02-20 LAB — CBC
HCT: 29.6 % — ABNORMAL LOW (ref 36.0–46.0)
Hemoglobin: 9.8 g/dL — ABNORMAL LOW (ref 12.0–15.0)
MCH: 33 pg (ref 26.0–34.0)
MCHC: 33.1 g/dL (ref 30.0–36.0)
MCV: 99.7 fL (ref 80.0–100.0)
Platelets: 165 10*3/uL (ref 150–400)
RBC: 2.97 MIL/uL — ABNORMAL LOW (ref 3.87–5.11)
RDW: 13.4 % (ref 11.5–15.5)
WBC: 6.9 10*3/uL (ref 4.0–10.5)
nRBC: 0 % (ref 0.0–0.2)

## 2023-02-20 MED ORDER — LACTATED RINGERS IV SOLN: INTRAVENOUS | Status: DC

## 2023-02-20 MED ORDER — IBUPROFEN 400 MG PO TABS
600.0000 mg | ORAL_TABLET | ORAL | Status: DC | PRN
  Administered 2023-02-20 (×2): 600 mg via ORAL
  Filled 2023-02-20 (×2): qty 2

## 2023-02-20 MED ORDER — THIAMINE MONONITRATE 100 MG PO TABS
100.0000 mg | ORAL_TABLET | Freq: Every day | ORAL | Status: DC
  Administered 2023-02-21: 100 mg via ORAL
  Filled 2023-02-20: qty 1

## 2023-02-20 MED ORDER — POLYETHYLENE GLYCOL 3350 17 G PO PACK
17.0000 g | PACK | Freq: Every day | ORAL | Status: DC
  Administered 2023-02-20: 17 g via ORAL
  Filled 2023-02-20: qty 1

## 2023-02-20 MED ORDER — FOLIC ACID 1 MG PO TABS
1.0000 mg | ORAL_TABLET | Freq: Every day | ORAL | Status: DC
  Administered 2023-02-20 – 2023-02-21 (×2): 1 mg via ORAL
  Filled 2023-02-20 (×2): qty 1

## 2023-02-20 NOTE — Discharge Summary (Signed)
Family Medicine Teaching [facility] Discharge Summary  Patient name: Gail Blackburn Medical record number: [id number] Date of birth: 05-21-1976 Age: 47 y.o. Gender: female Date of Admission: 02/18/2023  Date of Discharge: 02/21/23 Admitting Physician: Doreene Eland, MD  Primary Care Provider: Sabino Dick, DO Consultants: None  Indication for Hospitalization: Acute pancreatitis  Discharge Diagnoses/Problem List:  Principal Problem for Admission: Acute pancreatitis Other Problems addressed during stay:  Principal Problem:   Acute pancreatitis Active Problems:   Alcohol use disorder, moderate, dependence (HCC)   Pancreatitis, acute   Pancreatitis  Brief Hospital Course:  Gail Blackburn is a 47 y.o.female with a history of alcohol use, opioid use disoder, hx alcohol withdrawal, hx pancreatitis who was admitted to the [facility] at [facility] for acute pancreatitis. Her hospital course is detailed below:  Acute Pancreatitis Presented on 5/26 with abdominal pain and nausea. Lipase elevated to 145 and CT abdomen/pelvis confirmed acute pancreatitis. H/o alcohol use disorder and prior alcohol-induced pancreatitis. Treated with IV fluids and pain management (initially Tylenol and Toradol, but ultimately required oxycodone 5 mg q6h). Diet was advanced as tolerated, and at time of discharge she was tolerating PO. Dc'd w/ oxy 5 q6h prn for 8 doses.   Alcohol Use Disorder Patient reported heavy alcohol use in recent weeks due to death of husband and father going into hospice. Reports good support network and motivated to stop drinking. Interested in medication support for alcohol cessation, started on Acamprosate on discharge.  Other chronic conditions were medically managed with home medications and formulary alternatives as necessary (Bipolar disorder, migraines, GERD)  PCP Follow-up Recommendations: Started on Acamprosate for alcohol withdrawal (could not do Naltrexone due  to Oxy use for pain management). Follow-up on alcohol cessation. Clarify pain regimen/suboxone use. Unclear if pt is still taking. If not using suboxone, can consider adding naltrexone for alcohol use disorder.  Disposition: Home  Discharge Condition: Stable  Discharge Exam:  Vitals:   02/21/23 0424 02/21/23 0752  BP: (!) 112/90 122/79  Pulse: 66 73  Resp: 17 16  Temp: 97.7 F (36.5 C) 97.9 F (36.6 C)  SpO2: 100% 96%   General: Alert, comfortably laying in bed.  NAD. Cardiovascular: RRR, no murmur Respiratory: Normal WOB on RA Abdomen: Soft, TTP over epigastric and RUQ.  BS present. Extremities: No peripheral edema  Significant Procedures: None  Significant Labs and Imaging:  Recent Labs  Lab 02/20/23 0134 02/21/23 0056  WBC 6.9 5.7  HGB 9.8* 9.7*  HCT 29.6* 29.0*  PLT 165 179   Recent Labs  Lab 02/20/23 0134 02/21/23 0056  NA 139 136  K 3.6 3.5  CL 108 105  CO2 26 24  GLUCOSE 120* 109*  BUN <5* 6  CREATININE 0.69 0.78  CALCIUM 8.1* 8.0*    Pertinent Imaging: CT ABDOMEN PELVIS W CONTRAST Result Date: 02/18/2023 IMPRESSION: Findings compatible with acute pancreatitis.    Results/Tests Pending at Time of Discharge: None  Discharge Medications:  Allergies as of 02/21/2023       Reactions   Latex Rash, Other (See Comments)   Only in vaginal area   Tape Rash, Other (See Comments)   Rashes occur only if left on for a long time        Medication List     STOP taking these medications    cyclobenzaprine 10 MG tablet Commonly known as: FLEXERIL   Suboxone 8-2 MG Film Generic drug: Buprenorphine HCl-Naloxone HCl       TAKE these medications  acamprosate 333 MG tablet Commonly known as: CAMPRAL Take 2 tablets (666 mg total) by mouth 3 (three) times daily with meals.   acetaminophen 500 MG tablet Commonly known as: TYLENOL Take 2 tablets (1,000 mg total) by mouth every 6 (six) hours.   ALPRAZolam 0.5 MG tablet Commonly known as:  XANAX Take 0.5 mg by mouth 4 (four) times daily.   Botox 200 units injection Generic drug: botulinum toxin Type A Provider to inject 155 units into the muscles of the head and neck every 12 weeks. Discard remainder.   buPROPion 150 MG 12 hr tablet Commonly known as: WELLBUTRIN SR Take 150 mg by mouth in the morning.   ibuprofen 600 MG tablet Commonly known as: ADVIL Take 1 tablet (600 mg total) by mouth every 4 (four) hours as needed for mild pain or moderate pain.   Jencycla 0.35 MG tablet Generic drug: norethindrone Take 1 tablet by mouth daily.   Nurtec 75 MG Tbdp Generic drug: Rimegepant Sulfate Take 75 mg by mouth daily as needed. For migraines. Take as close to onset of migraine as possible. One daily maximum.   ondansetron 4 MG tablet Commonly known as: ZOFRAN TAKE 1 TABLET BY MOUTH EVERY 6 HOURS AS NEEDED FOR NAUSEA FOR VOMITING What changed:  how much to take how to take this when to take this reasons to take this additional instructions   oxyCODONE 5 MG immediate release tablet Commonly known as: Roxicodone Take 1 tablet (5 mg total) by mouth every 6 (six) hours as needed for severe pain.   propranolol ER 60 MG 24 hr capsule Commonly known as: INDERAL LA Take 1 capsule (60 mg total) by mouth daily.   Rexulti 2 MG Tabs tablet Generic drug: brexpiprazole Take 2 mg by mouth daily.   rizatriptan 10 MG disintegrating tablet Commonly known as: MAXALT-MLT 1 TABLET FOR MIGRAINE. MAY REPEAT IN 2 HOURS. What changed:  how much to take how to take this when to take this reasons to take this   thiamine 100 MG tablet Commonly known as: Vitamin B-1 Take 1 tablet (100 mg total) by mouth daily.   venlafaxine XR 75 MG 24 hr capsule Commonly known as: EFFEXOR-XR Take 1 capsule (75 mg total) by mouth daily with breakfast.        Discharge Instructions: Please refer to Patient Instructions section of EMR for full details.  Patient was counseled important signs  and symptoms that should prompt return to medical care, changes in medications, dietary instructions, activity restrictions, and follow up appointments.   Follow-Up Appointments:  Follow-up Information     Maury Dus, MD Follow up.   Specialty: Family Medicine Why: 5/29 @ 11:10AM Contact information: [address] [place] [address] [phone]                 Lincoln Brigham, MD 02/21/2023, 12:21 PM PGY-1, [facility] Family Medicine

## 2023-02-20 NOTE — Discharge Instructions (Signed)
Dear Gail Blackburn,   Thank you for letting us participate in your care! In this section, you will find a brief hospital admission summary of why you were admitted to the hospital, what happened during your admission, your diagnosis/diagnoses, and recommended follow up.  Primary diagnosis: Acute pancreatitis Treatment plan: You were treated with fluids and pain management for your pancreatitis. Please try to abstain from alcohol as this will make your symptoms worse. We are happy to help with medication for alcohol cravings in the clinic.   POST-HOSPITAL & CARE INSTRUCTIONS We recommend following up with your PCP within 1 week from being discharged from the hospital. Please let PCP/Specialists know of any changes in medications that were made which you will be able to see in the medications section of this packet.  DOCTOR'S APPOINTMENTS & FOLLOW UP Future Appointments  Date Time Provider Department Center  04/04/2023  2:30 PM Anson Fret, MD [facility] None     Thank you for choosing [facility]! Take care and be well!  Family Medicine Teaching Service Inpatient Team [place]  [facility]  [address] [place], [place] [address] [phone]

## 2023-02-21 ENCOUNTER — Other Ambulatory Visit (HOSPITAL_COMMUNITY): Payer: Self-pay

## 2023-02-21 DIAGNOSIS — F1020 Alcohol dependence, uncomplicated: Secondary | ICD-10-CM | POA: Diagnosis not present

## 2023-02-21 DIAGNOSIS — K852 Alcohol induced acute pancreatitis without necrosis or infection: Secondary | ICD-10-CM | POA: Diagnosis not present

## 2023-02-21 LAB — BASIC METABOLIC PANEL
Anion gap: 7 (ref 5–15)
BUN: 6 mg/dL (ref 6–20)
CO2: 24 mmol/L (ref 22–32)
Calcium: 8 mg/dL — ABNORMAL LOW (ref 8.9–10.3)
Chloride: 105 mmol/L (ref 98–111)
Creatinine, Ser: 0.78 mg/dL (ref 0.44–1.00)
GFR, Estimated: >60 mL/min (ref >60)
Glucose, Bld: 109 mg/dL — ABNORMAL HIGH (ref 70–99)
Potassium: 3.5 mmol/L (ref 3.5–5.1)
Sodium: 136 mmol/L (ref 135–145)

## 2023-02-21 LAB — CBC
HCT: 29 % — ABNORMAL LOW (ref 36.0–46.0)
Hemoglobin: 9.7 g/dL — ABNORMAL LOW (ref 12.0–15.0)
MCH: 33 pg (ref 26.0–34.0)
MCHC: 33.4 g/dL (ref 30.0–36.0)
MCV: 98.6 fL (ref 80.0–100.0)
Platelets: 179 10*3/uL (ref 150–400)
RBC: 2.94 MIL/uL — ABNORMAL LOW (ref 3.87–5.11)
RDW: 13.5 % (ref 11.5–15.5)
WBC: 5.7 10*3/uL (ref 4.0–10.5)
nRBC: 0 % (ref 0.0–0.2)

## 2023-02-21 LAB — LIPID PANEL
Cholesterol: 146 mg/dL (ref 0–200)
HDL: 53 mg/dL (ref >40)
LDL Cholesterol: 63 mg/dL (ref 0–99)
Total CHOL/HDL Ratio: 2.8 RATIO
Triglycerides: 152 mg/dL — ABNORMAL HIGH (ref <150)
VLDL: 30 mg/dL (ref 0–40)

## 2023-02-21 LAB — FERRITIN: Ferritin: 47 ng/mL (ref 11–307)

## 2023-02-21 MED ORDER — ACETAMINOPHEN 500 MG PO TABS: 1000.0000 mg | ORAL_TABLET | Freq: Four times a day (QID) | ORAL | 0 refills | Status: DC

## 2023-02-21 MED ORDER — OXYCODONE HCL 5 MG PO TABS
5.0000 mg | ORAL_TABLET | Freq: Four times a day (QID) | ORAL | 0 refills | Status: DC | PRN
  Filled 2023-02-21: qty 8, 2d supply, fill #0

## 2023-02-21 MED ORDER — IBUPROFEN 600 MG PO TABS: 600.0000 mg | ORAL_TABLET | ORAL | 0 refills | Status: DC | PRN

## 2023-02-21 MED ORDER — ACAMPROSATE CALCIUM 333 MG PO TBEC: 666.0000 mg | DELAYED_RELEASE_TABLET | Freq: Three times a day (TID) | ORAL | 0 refills | Status: AC

## 2023-02-21 MED ORDER — VITAMIN B-1 100 MG PO TABS: 100.0000 mg | ORAL_TABLET | Freq: Every day | ORAL | 0 refills | Status: DC

## 2023-02-21 MED ORDER — LORATADINE 10 MG PO TABS: 10.0000 mg | ORAL_TABLET | Freq: Every day | ORAL | Status: DC

## 2023-02-21 MED ORDER — LORATADINE 10 MG PO TABS
10.0000 mg | ORAL_TABLET | Freq: Every day | ORAL | Status: DC | PRN
  Administered 2023-02-21: 10 mg via ORAL
  Filled 2023-02-21: qty 1

## 2023-02-21 NOTE — TOC Benefit Eligibility Note (Signed)
Patient Product/process development scientist completed.    The patient is currently admitted and upon discharge could be taking acamposate (Campral) 333 mg tablet.  The current 30 day co-pay is $4.00.   The patient is insured through [facility] [place]   This test claim was processed through [facility]- copay amounts may vary at other pharmacies due to pharmacy/plan contracts, or as the patient moves through the different stages of their insurance plan.  Roland Earl, CPHT Pharmacy Patient Advocate Specialist [facility] Patient Advocate Team Direct Number: [phone]  Fax: [phone]

## 2023-02-21 NOTE — TOC Transition Note (Signed)
Transition of Care [facility]) - CM/SW Discharge Note   Patient Details  Name: Gail Blackburn MRN: [id number] Date of Birth: 11-Sep-1976  Transition of Care [facility]) CM/SW Contact:  Lawerance Sabal, RN Phone Number: 02/21/2023, 11:13 AM   Clinical Narrative:      Spoke w patient over the phone, she delined ETOH resources, states she has already been through a program and is aware of resources available to her.   Barriers to Discharge: No Barriers Identified   Patient Goals and CMS Choice      Discharge Placement                         Discharge Plan and Services Additional resources added to the After Visit Summary for     Discharge Planning Services: CM Consult                                 Social Determinants of Health (SDOH) Interventions SDOH Screenings   Food Insecurity: Patient Declined (02/19/2023)  Housing: Patient Declined (02/19/2023)  Transportation Needs: No Transportation Needs (02/19/2023)  Utilities: Not At Risk (02/19/2023)  Depression (PHQ2-9): Medium Risk (04/26/2021)  Tobacco Use: High Risk (02/18/2023)     Readmission Risk Interventions     No data to display

## 2023-02-22 ENCOUNTER — Ambulatory Visit: Payer: Self-pay | Admitting: Family Medicine

## 2023-03-08 DIAGNOSIS — F331 Major depressive disorder, recurrent, moderate: Secondary | ICD-10-CM | POA: Diagnosis not present

## 2023-03-15 ENCOUNTER — Other Ambulatory Visit: Payer: Self-pay

## 2023-03-20 ENCOUNTER — Other Ambulatory Visit (HOSPITAL_COMMUNITY): Payer: Self-pay

## 2023-03-22 ENCOUNTER — Encounter (HOSPITAL_COMMUNITY): Payer: Self-pay

## 2023-03-22 ENCOUNTER — Other Ambulatory Visit (HOSPITAL_COMMUNITY): Payer: Self-pay

## 2023-03-22 DIAGNOSIS — F331 Major depressive disorder, recurrent, moderate: Secondary | ICD-10-CM | POA: Diagnosis not present

## 2023-03-31 ENCOUNTER — Other Ambulatory Visit (HOSPITAL_COMMUNITY): Payer: Self-pay

## 2023-04-03 ENCOUNTER — Other Ambulatory Visit (HOSPITAL_COMMUNITY): Payer: Self-pay

## 2023-04-04 ENCOUNTER — Ambulatory Visit: Payer: Medicaid Other | Admitting: Neurology

## 2023-04-04 DIAGNOSIS — G43009 Migraine without aura, not intractable, without status migrainosus: Secondary | ICD-10-CM | POA: Diagnosis not present

## 2023-04-04 DIAGNOSIS — G43711 Chronic migraine without aura, intractable, with status migrainosus: Secondary | ICD-10-CM

## 2023-04-04 MED ORDER — ONABOTULINUMTOXINA 200 UNITS IJ SOLR
155.0000 [IU] | Freq: Once | INTRAMUSCULAR | Status: AC
  Administered 2023-04-04: 155 [IU] via INTRAMUSCULAR

## 2023-04-04 MED ORDER — ONDANSETRON 4 MG PO TBDP: 4.0000 mg | ORAL_TABLET | Freq: Three times a day (TID) | ORAL | 11 refills | Status: AC | PRN

## 2023-04-04 NOTE — Progress Notes (Signed)
04/04/2023 sytable 01/03/2023: stable, saw Dr Neale Burly they took her insurance and even feeling better.   10/11/2022: Doing well on botox, > 50% improvement, She has 4-6 migriane days a month, < 10 total headache days a month, failed imitrex, maxalt and ubrelvy, zomig, relpax will try and get nurtec acutely approved. Dr Neale Burly doesn't take her insurance. Referral for pain clinic fax to [facility] (reminded her to contact Dr. Charyl Bigger) Phone: [phone], Fax:  [phone] for occipital neuralgia.    Meds ordered this encounter  Medications   botulinum toxin Type A (BOTOX) injection 155 Units   ondansetron (ZOFRAN-ODT) 4 MG disintegrating tablet    Sig: Take 1-2 tablets (4-8 mg total) by mouth every 8 (eight) hours as needed.    Dispense:  30 tablet    Refill:  11     07/14/2022: Doing well on botox for her migraines(>>50% relief of migraine severity, freq and duration with botox) but still with considerable pain in occipital headaches and nerve blocks are the only thing that helps her occipital headaches. [facility].  No orders of the defined types were placed in this encounter.    04/26/2022: emgality did not help. Ajovy not approved. Botox working great >> 50% improvement in migraine frequency. Needs acute management. Failed imitrex and maxalt 9work ok) Ablation of occipital nerves helped a lot. Now having 4 migraine days a month and < 10 total headache days a month needs a good acute management medication. Will try nurtec  Meds ordered this encounter  Medications   botulinum toxin Type A (BOTOX) injection 155 Units   ondansetron (ZOFRAN-ODT) 4 MG disintegrating tablet    Sig: Take 1-2 tablets (4-8 mg total) by mouth every 8 (eight) hours as needed.    Dispense:  30 tablet    Refill:  11     01/26/2022: Botox gives > 50% relief but still having 8 migraine days a month and > 15 headache days a month will try  Ajovy, emgality not helping, aimovig contraindicated due to constipation.     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 >50% improvement, she has a lot of neck pain, getting injections at [facility]. Resend to [facility], she requested some dry needling, loved Shanda Bumps.    Consent Form Botulism Toxin Injection For Chronic Migraine    Reviewed orally with patient, additionally signature is on file:  Botulism toxin has been approved by the Federal drug administration for treatment of chronic migraine. Botulism toxin does not cure chronic migraine and it may not be effective in some patients.  The administration of botulism toxin is accomplished by injecting a small amount of toxin into the muscles of the neck and head. Dosage must be titrated for each individual. Any benefits resulting from botulism toxin tend to wear off after 3 months with a repeat injection required if benefit is to be maintained. Injections are usually done every 3-4 months with maximum effect peak achieved by about 2 or 3 weeks. Botulism toxin is expensive and you should be sure of what costs you will incur resulting from the injection.  The side effects of botulism toxin use for chronic migraine may include:   -Transient, and usually mild, facial weakness with facial injections  -Transient, and usually mild, head or neck weakness with head/neck injections  -Reduction or loss of forehead facial animation due to forehead muscle weakness  -Eyelid drooping  -Dry eye  -Pain at  the site of injection or bruising at the site of injection  -Double vision  -Potential unknown long term risks   Contraindications: You should not have Botox if you are pregnant, nursing, allergic to albumin, have an infection, skin condition, or muscle weakness at the site of the injection, or have myasthenia gravis, Lambert-Eaton syndrome, or ALS.  It is also possible that as with any injection, there may be an  allergic reaction or no effect from the medication. Reduced effectiveness after repeated injections is sometimes seen and rarely infection at the injection site may occur. All care will be taken to prevent these side effects. If therapy is given over a long time, atrophy and wasting in the muscle injected may occur. Occasionally the patient's become refractory to treatment because they develop antibodies to the toxin. In this event, therapy needs to be modified.  I have read the above information and consent to the administration of botulism toxin.    BOTOX PROCEDURE NOTE FOR MIGRAINE HEADACHE  Contraindications and precautions discussed with patient(above). Aseptic rocedure was observed and patient tolerated procedure.   The condition has existed for more than 6 months, and pt does not have a diagnosis of ALS, Myasthenia Gravis or Lambert-Eaton Syndrome.  Risks and benefits of injections discussed and pt agrees to proceed with the procedure.  Written consent obtained  These injections are medically necessary. Pt  receives good benefits from these injections. These injections do not cause sedations or hallucinations which the oral therapies may cause.   Description of procedure:  The patient was placed in a sitting position. The standard protocol was used for Botox as follows, with 5 units of Botox injected at each site:  -Procerus muscle, midline injection  -Corrugator muscle, bilateral injection  -Frontalis muscle, bilateral injection, with 2 sites each side, medial injection was performed in the upper one third of the frontalis muscle, in the region vertical from the medial inferior edge of the superior orbital rim. The lateral injection was again in the upper one third of the forehead vertically above the lateral limbus of the cornea, 1.5 cm lateral to the medial injection site.  -Temporalis muscle injection, 4 sites, bilaterally. The first injection was 3 cm above the tragus of the ear,  second injection site was 1.5 cm to 3 cm up from the first injection site in line with the tragus of the ear. The third injection site was 1.5-3 cm forward between the first 2 injection sites. The fourth injection site was 1.5 cm posterior to the second injection site. 5th site laterally in the temporalis  muscleat the level of the outer canthus.  -Occipitalis muscle injection, 3 sites, bilaterally. The first injection was done one half way between the occipital protuberance and the tip of the mastoid process behind the ear. The second injection site was done lateral and superior to the first, 1 fingerbreadth from the first injection. The third injection site was 1 fingerbreadth superiorly and medially from the first injection site.  -Cervical paraspinal muscle injection, 2 sites, bilaterally. The first injection site was 1 cm from the midline of the cervical spine, 3 cm inferior to the lower border of the occipital protuberance. The second injection site was 1.5 cm superiorly and laterally to the first injection site.  -Trapezius muscle injection was performed at 3 sites, bilaterally. The first injection site was in the upper trapezius muscle halfway between the inflection point of the neck, and the acromion. The second injection site was one half way between  the acromion and the first injection site. The third injection was done between the first injection site and the inflection point of the neck.  Will return for repeat injection in 3 months.   A total of 155 units of Botox was prepared, 45 units of Botox was injected as documented above, any Botox not injected was wasted. The patient tolerated the procedure well, there were no complications of the above procedure.

## 2023-04-04 NOTE — Progress Notes (Signed)
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

## 2023-04-17 DIAGNOSIS — F331 Major depressive disorder, recurrent, moderate: Secondary | ICD-10-CM | POA: Diagnosis not present

## 2023-05-17 DIAGNOSIS — F250 Schizoaffective disorder, bipolar type: Secondary | ICD-10-CM | POA: Diagnosis not present

## 2023-05-20 ENCOUNTER — Other Ambulatory Visit: Payer: Self-pay | Admitting: Neurology

## 2023-05-22 ENCOUNTER — Telehealth: Payer: Self-pay | Admitting: Neurology

## 2023-05-22 NOTE — Telephone Encounter (Signed)
I do not see any denial, just a pending request to [place].  Propranolol has been refilled.

## 2023-05-22 NOTE — Telephone Encounter (Signed)
Pt states she has been told there was a denial on her  propranolol ER (INDERAL LA) 60 MG 24 hr capsule, Pt states she is not sure if it has to do with the pharmacy trying to fill as a 90 day or not.  Please call pt to clarify.

## 2023-05-24 DIAGNOSIS — F331 Major depressive disorder, recurrent, moderate: Secondary | ICD-10-CM | POA: Diagnosis not present

## 2023-05-24 DIAGNOSIS — Z79899 Other long term (current) drug therapy: Secondary | ICD-10-CM | POA: Diagnosis not present

## 2023-05-24 DIAGNOSIS — F250 Schizoaffective disorder, bipolar type: Secondary | ICD-10-CM | POA: Diagnosis not present

## 2023-05-24 DIAGNOSIS — F3181 Bipolar II disorder: Secondary | ICD-10-CM | POA: Diagnosis not present

## 2023-05-24 DIAGNOSIS — Z5181 Encounter for therapeutic drug level monitoring: Secondary | ICD-10-CM | POA: Diagnosis not present

## 2023-06-06 ENCOUNTER — Other Ambulatory Visit (HOSPITAL_COMMUNITY): Payer: Self-pay

## 2023-06-09 DIAGNOSIS — G518 Other disorders of facial nerve: Secondary | ICD-10-CM | POA: Diagnosis not present

## 2023-06-09 DIAGNOSIS — M791 Myalgia, unspecified site: Secondary | ICD-10-CM | POA: Diagnosis not present

## 2023-06-09 DIAGNOSIS — G43719 Chronic migraine without aura, intractable, without status migrainosus: Secondary | ICD-10-CM | POA: Diagnosis not present

## 2023-06-09 DIAGNOSIS — M542 Cervicalgia: Secondary | ICD-10-CM | POA: Diagnosis not present

## 2023-06-18 ENCOUNTER — Other Ambulatory Visit: Payer: Self-pay | Admitting: Neurology

## 2023-06-20 DIAGNOSIS — F3181 Bipolar II disorder: Secondary | ICD-10-CM | POA: Diagnosis not present

## 2023-06-26 DIAGNOSIS — G518 Other disorders of facial nerve: Secondary | ICD-10-CM | POA: Diagnosis not present

## 2023-06-26 DIAGNOSIS — M542 Cervicalgia: Secondary | ICD-10-CM | POA: Diagnosis not present

## 2023-06-26 DIAGNOSIS — G43719 Chronic migraine without aura, intractable, without status migrainosus: Secondary | ICD-10-CM | POA: Diagnosis not present

## 2023-06-26 DIAGNOSIS — M791 Myalgia, unspecified site: Secondary | ICD-10-CM | POA: Diagnosis not present

## 2023-06-27 ENCOUNTER — Ambulatory Visit: Payer: Medicaid Other | Admitting: Neurology

## 2023-06-27 DIAGNOSIS — G43711 Chronic migraine without aura, intractable, with status migrainosus: Secondary | ICD-10-CM

## 2023-06-27 MED ORDER — AJOVY 225 MG/1.5ML ~~LOC~~ SOAJ: 225.0000 mg | SUBCUTANEOUS | 11 refills | Status: DC

## 2023-06-27 MED ORDER — ONABOTULINUMTOXINA 200 UNITS IJ SOLR
155.0000 [IU] | Freq: Once | INTRAMUSCULAR | Status: AC
Start: 2023-06-27 — End: 2023-06-27
  Administered 2023-06-27: 155 [IU] via INTRAMUSCULAR

## 2023-06-27 NOTE — Progress Notes (Signed)
Botox- 200 units x 1 vial Lot: [id number] Expiration: 08/2025 NDC: 5409-8119-14  Bacteriostatic 0.9% Sodium Chloride- 4 mL  Lot: [id number] Expiration: 12/26/2023 NDC: 2130-8657-84  Dx: O96.295 S/P  Witnessed by Joellen Jersey RN

## 2023-06-27 NOTE — Progress Notes (Signed)
06/27/2023:Patient is doing well > 50% improvement in migraine frequency but she still has 8 migraine days a month and 15 total headache days a month and feels she was much better controlled when she was on Ajovy and would like it prescribed again.  Meds ordered this encounter  Medications   botulinum toxin Type A (BOTOX) injection 155 Units    Botox- 200 units x 1 vial Lot: [id number] Expiration: 08/2025 NDC: 1324-4010-27  Dx: O53.664 S/P  Witnessed by Joellen Jersey RN   Fremanezumab-vfrm (AJOVY) 225 MG/1.5ML SOAJ    Sig: Inject 225 mg into the skin every 30 (thirty) days.    Dispense:  1.5 mL    Refill:  11    04/04/2023 sytable 01/03/2023: stable, saw Dr Neale Burly they took her insurance and even feeling better.   10/11/2022: Doing well on botox, > 50% improvement, She has 4-6 migriane days a month, < 10 total headache days a month, failed imitrex, maxalt and ubrelvy, zomig, relpax will try and get nurtec acutely approved. Dr Neale Burly doesn't take her insurance. Referral for pain clinic fax to [facility] (reminded her to contact Dr. Charyl Bigger) Phone: [phone], Fax:  [phone] for occipital neuralgia.    No orders of the defined types were placed in this encounter.    07/14/2022: Doing well on botox for her migraines(>>50% relief of migraine severity, freq and duration with botox) but still with considerable pain in occipital headaches and nerve blocks are the only thing that helps her occipital headaches. [facility].  No orders of the defined types were placed in this encounter.    04/26/2022: emgality did not help. Ajovy not approved. Botox working great >> 50% improvement in migraine frequency. Needs acute management. Failed imitrex and maxalt 9work ok) Ablation of occipital nerves helped a lot. Now having 4 migraine days a month and < 10 total headache days a month needs a good acute management medication.  Will try nurtec  No orders of the defined types were placed in this encounter.    01/26/2022: Botox gives > 50% relief but still having 8 migraine days a month and > 15 headache days a month will try Ajovy, emgality not helping, aimovig contraindicated due to constipation.     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 >50% improvement, she has a lot of neck pain, getting injections at [facility]. Resend to [facility], she requested some dry needling, loved Shanda Bumps.    Consent Form Botulism Toxin Injection For Chronic Migraine    Reviewed orally with patient, additionally signature is on file:  Botulism toxin has been approved by the Federal drug administration for treatment of chronic migraine. Botulism toxin does not cure chronic migraine and it may not be effective in some patients.  The administration of botulism toxin is accomplished by injecting a small amount of toxin into the muscles of the neck and head. Dosage must be titrated for each individual. Any benefits resulting from botulism toxin tend to wear off after 3 months with a repeat injection required if benefit is to be maintained. Injections are usually done every 3-4 months with maximum effect peak achieved by about 2 or 3 weeks. Botulism toxin is expensive and you should be sure of what costs you will incur resulting from the injection.  The side effects of botulism toxin use for chronic migraine may include:   -Transient, and usually mild, facial weakness with facial injections  -  Transient, and usually mild, head or neck weakness with head/neck injections  -Reduction or loss of forehead facial animation due to forehead muscle weakness  -Eyelid drooping  -Dry eye  -Pain at the site of injection or bruising at the site of injection  -Double vision  -Potential unknown long term risks   Contraindications: You should not have Botox if you are pregnant, nursing, allergic to albumin, have an  infection, skin condition, or muscle weakness at the site of the injection, or have myasthenia gravis, Lambert-Eaton syndrome, or ALS.  It is also possible that as with any injection, there may be an allergic reaction or no effect from the medication. Reduced effectiveness after repeated injections is sometimes seen and rarely infection at the injection site may occur. All care will be taken to prevent these side effects. If therapy is given over a long time, atrophy and wasting in the muscle injected may occur. Occasionally the patient's become refractory to treatment because they develop antibodies to the toxin. In this event, therapy needs to be modified.  I have read the above information and consent to the administration of botulism toxin.    BOTOX PROCEDURE NOTE FOR MIGRAINE HEADACHE  Contraindications and precautions discussed with patient(above). Aseptic rocedure was observed and patient tolerated procedure.   The condition has existed for more than 6 months, and pt does not have a diagnosis of ALS, Myasthenia Gravis or Lambert-Eaton Syndrome.  Risks and benefits of injections discussed and pt agrees to proceed with the procedure.  Written consent obtained  These injections are medically necessary. Pt  receives good benefits from these injections. These injections do not cause sedations or hallucinations which the oral therapies may cause.   Description of procedure:  The patient was placed in a sitting position. The standard protocol was used for Botox as follows, with 5 units of Botox injected at each site:  -Procerus muscle, midline injection  -Corrugator muscle, bilateral injection  -Frontalis muscle, bilateral injection, with 2 sites each side, medial injection was performed in the upper one third of the frontalis muscle, in the region vertical from the medial inferior edge of the superior orbital rim. The lateral injection was again in the upper one third of the forehead  vertically above the lateral limbus of the cornea, 1.5 cm lateral to the medial injection site.  -Temporalis muscle injection, 4 sites, bilaterally. The first injection was 3 cm above the tragus of the ear, second injection site was 1.5 cm to 3 cm up from the first injection site in line with the tragus of the ear. The third injection site was 1.5-3 cm forward between the first 2 injection sites. The fourth injection site was 1.5 cm posterior to the second injection site. 5th site laterally in the temporalis  muscleat the level of the outer canthus.  -Occipitalis muscle injection, 3 sites, bilaterally. The first injection was done one half way between the occipital protuberance and the tip of the mastoid process behind the ear. The second injection site was done lateral and superior to the first, 1 fingerbreadth from the first injection. The third injection site was 1 fingerbreadth superiorly and medially from the first injection site.  -Cervical paraspinal muscle injection, 2 sites, bilaterally. The first injection site was 1 cm from the midline of the cervical spine, 3 cm inferior to the lower border of the occipital protuberance. The second injection site was 1.5 cm superiorly and laterally to the first injection site.  -Trapezius muscle injection was performed at  3 sites, bilaterally. The first injection site was in the upper trapezius muscle halfway between the inflection point of the neck, and the acromion. The second injection site was one half way between the acromion and the first injection site. The third injection was done between the first injection site and the inflection point of the neck.  Will return for repeat injection in 3 months.   A total of 155 units of Botox was prepared, 45 units of Botox was injected as documented above, any Botox not injected was wasted. The patient tolerated the procedure well, there were no complications of the above procedure.

## 2023-07-08 ENCOUNTER — Encounter: Payer: Self-pay | Admitting: Neurology

## 2023-07-10 ENCOUNTER — Telehealth: Payer: Self-pay | Admitting: *Deleted

## 2023-07-10 DIAGNOSIS — G518 Other disorders of facial nerve: Secondary | ICD-10-CM | POA: Diagnosis not present

## 2023-07-10 DIAGNOSIS — G43719 Chronic migraine without aura, intractable, without status migrainosus: Secondary | ICD-10-CM | POA: Diagnosis not present

## 2023-07-10 DIAGNOSIS — M542 Cervicalgia: Secondary | ICD-10-CM | POA: Diagnosis not present

## 2023-07-10 DIAGNOSIS — M791 Myalgia, unspecified site: Secondary | ICD-10-CM | POA: Diagnosis not present

## 2023-07-10 NOTE — Telephone Encounter (Signed)
Ajovy PA is needed. Thank you!

## 2023-07-11 ENCOUNTER — Other Ambulatory Visit (HOSPITAL_COMMUNITY): Payer: Self-pay

## 2023-07-11 ENCOUNTER — Telehealth: Payer: Self-pay

## 2023-07-11 DIAGNOSIS — G43711 Chronic migraine without aura, intractable, with status migrainosus: Secondary | ICD-10-CM

## 2023-07-11 DIAGNOSIS — Z79899 Other long term (current) drug therapy: Secondary | ICD-10-CM

## 2023-07-11 NOTE — Telephone Encounter (Signed)
   I could not find a current neg urine pregnancy test in chart, also searched for Hysterectomy and IUD with no findings-please advise

## 2023-07-12 ENCOUNTER — Other Ambulatory Visit: Payer: Self-pay | Admitting: *Deleted

## 2023-07-12 DIAGNOSIS — Z79899 Other long term (current) drug therapy: Secondary | ICD-10-CM

## 2023-07-12 NOTE — Telephone Encounter (Signed)
PA request has been Submitted. New Encounter created for follow up. For additional info see Pharmacy Prior Auth telephone encounter from 07/11/2023.

## 2023-07-12 NOTE — Telephone Encounter (Signed)
Noted  

## 2023-07-17 DIAGNOSIS — Z79899 Other long term (current) drug therapy: Secondary | ICD-10-CM | POA: Diagnosis not present

## 2023-07-18 ENCOUNTER — Encounter: Payer: Self-pay | Admitting: *Deleted

## 2023-07-18 LAB — PREGNANCY, URINE: Preg Test, Ur: NEGATIVE

## 2023-07-18 NOTE — Telephone Encounter (Signed)
Negative pregnancy test.  Resulted 07-18-2023.

## 2023-07-19 NOTE — Telephone Encounter (Signed)
Pharmacy Patient Advocate Encounter   Received notification from Physician's Office that prior authorization for AJOVY (fremanezumab-vfrm) injection 225MG /1.5ML auto-injectors is required/requested.   Insurance verification completed.   The patient is insured through [facility] .   Per test claim: PA required; PA submitted to [facility] via CoverMyMeds Key/confirmation #/EOC [id number] Status is pending

## 2023-07-20 ENCOUNTER — Other Ambulatory Visit (HOSPITAL_COMMUNITY): Payer: Self-pay

## 2023-07-20 NOTE — Telephone Encounter (Signed)
Pharmacy Patient Advocate Encounter  Received notification from [facility] that Prior Authorization for AJOVY (fremanezumab-vfrm) injection 225MG /1.5ML auto-injectors has been APPROVED from 07/19/2023 to 10/17/2023   PA #/Case ID/Reference #: PA Case ID #: [id number]

## 2023-07-24 DIAGNOSIS — M791 Myalgia, unspecified site: Secondary | ICD-10-CM | POA: Diagnosis not present

## 2023-07-24 DIAGNOSIS — G518 Other disorders of facial nerve: Secondary | ICD-10-CM | POA: Diagnosis not present

## 2023-07-24 DIAGNOSIS — G43719 Chronic migraine without aura, intractable, without status migrainosus: Secondary | ICD-10-CM | POA: Diagnosis not present

## 2023-07-24 DIAGNOSIS — M542 Cervicalgia: Secondary | ICD-10-CM | POA: Diagnosis not present

## 2023-07-24 MED ORDER — AJOVY 225 MG/1.5ML ~~LOC~~ SOAJ: 225.0000 mg | SUBCUTANEOUS | 11 refills | Status: DC

## 2023-07-24 NOTE — Addendum Note (Signed)
Addended by: Bertram Savin on: 07/24/2023 08:01 AM   Modules accepted: Orders

## 2023-07-27 DIAGNOSIS — M47812 Spondylosis without myelopathy or radiculopathy, cervical region: Secondary | ICD-10-CM | POA: Diagnosis not present

## 2023-08-02 ENCOUNTER — Other Ambulatory Visit: Payer: Self-pay

## 2023-08-22 ENCOUNTER — Telehealth: Payer: Self-pay | Admitting: Neurology

## 2023-08-22 DIAGNOSIS — G43711 Chronic migraine without aura, intractable, with status migrainosus: Secondary | ICD-10-CM

## 2023-08-22 NOTE — Telephone Encounter (Signed)
Pt has new [employer]. I submitted auth via CMM and received approval. Pt will continue to fill through [facility].   Auth#: [id number] (08/21/23-10/27/23)

## 2023-08-23 ENCOUNTER — Other Ambulatory Visit: Payer: Self-pay | Admitting: Neurology

## 2023-08-23 MED ORDER — PROPRANOLOL HCL ER 60 MG PO CP24: 60.0000 mg | ORAL_CAPSULE | Freq: Every day | ORAL | 1 refills | Status: DC

## 2023-08-29 DIAGNOSIS — F411 Generalized anxiety disorder: Secondary | ICD-10-CM | POA: Diagnosis not present

## 2023-08-29 DIAGNOSIS — F331 Major depressive disorder, recurrent, moderate: Secondary | ICD-10-CM | POA: Diagnosis not present

## 2023-08-30 MED ORDER — BOTOX 200 UNITS IJ SOLR
INTRAMUSCULAR | 3 refills | Status: DC
Start: 2023-08-30 — End: 2024-08-15

## 2023-08-30 NOTE — Addendum Note (Signed)
Addended by: Bertram Savin on: 08/30/2023 05:43 PM   Modules accepted: Orders

## 2023-08-30 NOTE — Telephone Encounter (Signed)
LVM at 3:53 pm [facility] Calling to request a prescription for Botox 200 units single dose val. Call back phone number [phone]. Fax: [phone]

## 2023-08-30 NOTE — Telephone Encounter (Signed)
Botox Rx sent to [facility].

## 2023-09-01 ENCOUNTER — Other Ambulatory Visit: Payer: Self-pay

## 2023-09-05 ENCOUNTER — Telehealth: Payer: Self-pay | Admitting: Neurology

## 2023-09-05 NOTE — Telephone Encounter (Signed)
[facility] - [place], [place] - [address] calling to confirm upcoming appt details.  Botox 200 units, SDV Quantity: 1 Delivery tomorrow 12/11 Call back number [phone] option 2

## 2023-09-06 DIAGNOSIS — M791 Myalgia, unspecified site: Secondary | ICD-10-CM | POA: Diagnosis not present

## 2023-09-06 DIAGNOSIS — M542 Cervicalgia: Secondary | ICD-10-CM | POA: Diagnosis not present

## 2023-09-06 DIAGNOSIS — G518 Other disorders of facial nerve: Secondary | ICD-10-CM | POA: Diagnosis not present

## 2023-09-06 DIAGNOSIS — G43719 Chronic migraine without aura, intractable, without status migrainosus: Secondary | ICD-10-CM | POA: Diagnosis not present

## 2023-09-07 ENCOUNTER — Other Ambulatory Visit: Payer: Self-pay

## 2023-09-08 DIAGNOSIS — F250 Schizoaffective disorder, bipolar type: Secondary | ICD-10-CM | POA: Diagnosis not present

## 2023-09-11 ENCOUNTER — Other Ambulatory Visit: Payer: Self-pay

## 2023-09-11 NOTE — Progress Notes (Signed)
Disenrolling - removing patient from specialty pharmacy program. Botox is being filled at different specialty pharmacy.

## 2023-09-12 DIAGNOSIS — F411 Generalized anxiety disorder: Secondary | ICD-10-CM | POA: Diagnosis not present

## 2023-09-12 DIAGNOSIS — F331 Major depressive disorder, recurrent, moderate: Secondary | ICD-10-CM | POA: Diagnosis not present

## 2023-09-19 ENCOUNTER — Ambulatory Visit: Payer: Medicaid Other | Admitting: Neurology

## 2023-09-19 DIAGNOSIS — G43711 Chronic migraine without aura, intractable, with status migrainosus: Secondary | ICD-10-CM

## 2023-09-19 MED ORDER — ONABOTULINUMTOXINA 200 UNITS IJ SOLR
155.0000 [IU] | Freq: Once | INTRAMUSCULAR | Status: AC
Start: 2023-09-19 — End: 2023-09-19
  Administered 2023-09-19: 155 [IU] via INTRAMUSCULAR

## 2023-09-19 NOTE — Progress Notes (Signed)
09/19/2023: stable. After the 2nd to 3rd month of ajovy see how she is doing and consider vyepti if needed.  06/27/2023:Patient is doing well > 50% improvement in migraine frequency but she still has 8 migraine days a month and 15 total headache days a month and feels she was much better controlled when she was on Ajovy and would like it prescribed again.  Meds ordered this encounter  Medications   botulinum toxin Type A (BOTOX) injection 155 Units    Botox- 200 units x 1 vial Lot: [id number] Expiration: 05/2025 NDC: 8295-6213-08 S/P    04/04/2023 sytable 01/03/2023: stable, saw Dr Neale Burly they took her insurance and even feeling better.   10/11/2022: Doing well on botox, > 50% improvement, She has 4-6 migriane days a month, < 10 total headache days a month, failed imitrex, maxalt and ubrelvy, zomig, relpax will try and get nurtec acutely approved. Dr Neale Burly doesn't take her insurance. Referral for pain clinic fax to [facility] (reminded her to contact Dr. Charyl Bigger) Phone: [phone], Fax:  [phone] for occipital neuralgia.    Meds ordered this encounter  Medications   botulinum toxin Type A (BOTOX) injection 155 Units    Botox- 200 units x 1 vial Lot: [id number] Expiration: 05/2025 NDC: 3664-4034-74 S/P     07/14/2022: Doing well on botox for her migraines(>>50% relief of migraine severity, freq and duration with botox) but still with considerable pain in occipital headaches and nerve blocks are the only thing that helps her occipital headaches. [facility].  No orders of the defined types were placed in this encounter.    04/26/2022: emgality did not help. Ajovy not approved. Botox working great >> 50% improvement in migraine frequency. Needs acute management. Failed imitrex and maxalt 9work ok) Ablation of occipital nerves helped a lot. Now having 4 migraine days a month and < 10 total headache days a month  needs a good acute management medication. Will try nurtec  Meds ordered this encounter  Medications   botulinum toxin Type A (BOTOX) injection 155 Units    Botox- 200 units x 1 vial Lot: [id number] Expiration: 05/2025 NDC: 8756-4332-95 S/P     01/26/2022: Botox gives > 50% relief but still having 8 migraine days a month and > 15 headache days a month will try Ajovy, emgality not helping, aimovig contraindicated due to constipation.     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 >50% improvement, she has a lot of neck pain, getting injections at [facility]. Resend to [facility], she requested some dry needling, loved Shanda Bumps.    Consent Form Botulism Toxin Injection For Chronic Migraine    Reviewed orally with patient, additionally signature is on file:  Botulism toxin has been approved by the Federal drug administration for treatment of chronic migraine. Botulism toxin does not cure chronic migraine and it may not be effective in some patients.  The administration of botulism toxin is accomplished by injecting a small amount of toxin into the muscles of the neck and head. Dosage must be titrated for each individual. Any benefits resulting from botulism toxin tend to wear off after 3 months with a repeat injection required if benefit is to be maintained. Injections are usually done every 3-4 months with maximum effect peak achieved by about 2 or 3 weeks. Botulism toxin is expensive and you should be sure of what costs you will incur resulting from the injection.  The side  effects of botulism toxin use for chronic migraine may include:   -Transient, and usually mild, facial weakness with facial injections  -Transient, and usually mild, head or neck weakness with head/neck injections  -Reduction or loss of forehead facial animation due to forehead muscle weakness  -Eyelid drooping  -Dry eye  -Pain at the site of injection or bruising at the site of  injection  -Double vision  -Potential unknown long term risks   Contraindications: You should not have Botox if you are pregnant, nursing, allergic to albumin, have an infection, skin condition, or muscle weakness at the site of the injection, or have myasthenia gravis, Lambert-Eaton syndrome, or ALS.  It is also possible that as with any injection, there may be an allergic reaction or no effect from the medication. Reduced effectiveness after repeated injections is sometimes seen and rarely infection at the injection site may occur. All care will be taken to prevent these side effects. If therapy is given over a long time, atrophy and wasting in the muscle injected may occur. Occasionally the patient's become refractory to treatment because they develop antibodies to the toxin. In this event, therapy needs to be modified.  I have read the above information and consent to the administration of botulism toxin.    BOTOX PROCEDURE NOTE FOR MIGRAINE HEADACHE  Contraindications and precautions discussed with patient(above). Aseptic rocedure was observed and patient tolerated procedure.   The condition has existed for more than 6 months, and pt does not have a diagnosis of ALS, Myasthenia Gravis or Lambert-Eaton Syndrome.  Risks and benefits of injections discussed and pt agrees to proceed with the procedure.  Written consent obtained  These injections are medically necessary. Pt  receives good benefits from these injections. These injections do not cause sedations or hallucinations which the oral therapies may cause.   Description of procedure:  The patient was placed in a sitting position. The standard protocol was used for Botox as follows, with 5 units of Botox injected at each site:  -Procerus muscle, midline injection  -Corrugator muscle, bilateral injection  -Frontalis muscle, bilateral injection, with 2 sites each side, medial injection was performed in the upper one third of the  frontalis muscle, in the region vertical from the medial inferior edge of the superior orbital rim. The lateral injection was again in the upper one third of the forehead vertically above the lateral limbus of the cornea, 1.5 cm lateral to the medial injection site.  -Temporalis muscle injection, 4 sites, bilaterally. The first injection was 3 cm above the tragus of the ear, second injection site was 1.5 cm to 3 cm up from the first injection site in line with the tragus of the ear. The third injection site was 1.5-3 cm forward between the first 2 injection sites. The fourth injection site was 1.5 cm posterior to the second injection site. 5th site laterally in the temporalis  muscleat the level of the outer canthus.  -Occipitalis muscle injection, 3 sites, bilaterally. The first injection was done one half way between the occipital protuberance and the tip of the mastoid process behind the ear. The second injection site was done lateral and superior to the first, 1 fingerbreadth from the first injection. The third injection site was 1 fingerbreadth superiorly and medially from the first injection site.  -Cervical paraspinal muscle injection, 2 sites, bilaterally. The first injection site was 1 cm from the midline of the cervical spine, 3 cm inferior to the lower border of the occipital protuberance.  The second injection site was 1.5 cm superiorly and laterally to the first injection site.  -Trapezius muscle injection was performed at 3 sites, bilaterally. The first injection site was in the upper trapezius muscle halfway between the inflection point of the neck, and the acromion. The second injection site was one half way between the acromion and the first injection site. The third injection was done between the first injection site and the inflection point of the neck.  Will return for repeat injection in 3 months.   A total of 155 units of Botox was prepared, 45 units of Botox was injected as  documented above, any Botox not injected was wasted. The patient tolerated the procedure well, there were no complications of the above procedure.

## 2023-09-19 NOTE — Progress Notes (Signed)
Botox- 200 units x 1 vial Lot: [id number] Expiration: 05/2025 NDC: 4098-1191-47  Bacteriostatic 0.9% Sodium Chloride- * mL  Lot: [id number] Expiration: 07/27/2024 NDC: 1308-6578-46  Dx: N62.952 S/P Witnessed by Truitt Leep. RN

## 2023-09-29 DIAGNOSIS — M791 Myalgia, unspecified site: Secondary | ICD-10-CM | POA: Diagnosis not present

## 2023-09-29 DIAGNOSIS — G43719 Chronic migraine without aura, intractable, without status migrainosus: Secondary | ICD-10-CM | POA: Diagnosis not present

## 2023-09-29 DIAGNOSIS — M542 Cervicalgia: Secondary | ICD-10-CM | POA: Diagnosis not present

## 2023-09-29 DIAGNOSIS — G518 Other disorders of facial nerve: Secondary | ICD-10-CM | POA: Diagnosis not present

## 2023-10-03 DIAGNOSIS — F411 Generalized anxiety disorder: Secondary | ICD-10-CM | POA: Diagnosis not present

## 2023-10-03 DIAGNOSIS — F331 Major depressive disorder, recurrent, moderate: Secondary | ICD-10-CM | POA: Diagnosis not present

## 2023-10-11 DIAGNOSIS — F411 Generalized anxiety disorder: Secondary | ICD-10-CM | POA: Diagnosis not present

## 2023-10-11 DIAGNOSIS — F331 Major depressive disorder, recurrent, moderate: Secondary | ICD-10-CM | POA: Diagnosis not present

## 2023-10-23 ENCOUNTER — Other Ambulatory Visit: Payer: Self-pay | Admitting: Neurology

## 2023-10-23 ENCOUNTER — Other Ambulatory Visit: Payer: Self-pay

## 2023-10-23 DIAGNOSIS — G43009 Migraine without aura, not intractable, without status migrainosus: Secondary | ICD-10-CM

## 2023-10-23 MED ORDER — NURTEC 75 MG PO TBDP: 75.0000 mg | ORAL_TABLET | Freq: Every day | ORAL | 6 refills | Status: AC | PRN

## 2023-11-01 ENCOUNTER — Ambulatory Visit (HOSPITAL_COMMUNITY): Payer: Medicaid Other | Admitting: Mental Health

## 2023-11-01 DIAGNOSIS — F331 Major depressive disorder, recurrent, moderate: Secondary | ICD-10-CM | POA: Diagnosis not present

## 2023-11-01 DIAGNOSIS — F411 Generalized anxiety disorder: Secondary | ICD-10-CM | POA: Diagnosis not present

## 2023-11-02 ENCOUNTER — Telehealth: Payer: Self-pay | Admitting: Pharmacy Technician

## 2023-11-02 ENCOUNTER — Other Ambulatory Visit (HOSPITAL_COMMUNITY): Payer: Self-pay

## 2023-11-02 NOTE — Telephone Encounter (Signed)
 Pharmacy Patient Advocate Encounter  Received notification from [facility] that Prior Authorization for AJOVY  (fremanezumab -vfrm) injection 225MG /1.5ML auto-injectors has been APPROVED from 11/05/2023 to 02/096/2026. Ran test claim, Copay is $4.00. This test claim was processed through [facility]- copay amounts may vary at other pharmacies due to pharmacy/plan contracts, or as the patient moves through the different stages of their insurance plan.   PA #/Case ID/Reference #: [id number] Key: [facility]

## 2023-11-02 NOTE — Telephone Encounter (Signed)
 Pharmacy Patient Advocate Encounter  Received notification from [facility] that Prior Authorization for Nurtec 75MG  dispersible tablets has been APPROVED from 11/02/2023 to 11/01/2024   PA #/Case ID/Reference #: [id number] Key: [id number]

## 2023-11-08 DIAGNOSIS — M542 Cervicalgia: Secondary | ICD-10-CM | POA: Diagnosis not present

## 2023-11-08 DIAGNOSIS — F411 Generalized anxiety disorder: Secondary | ICD-10-CM | POA: Diagnosis not present

## 2023-11-08 DIAGNOSIS — G518 Other disorders of facial nerve: Secondary | ICD-10-CM | POA: Diagnosis not present

## 2023-11-08 DIAGNOSIS — M791 Myalgia, unspecified site: Secondary | ICD-10-CM | POA: Diagnosis not present

## 2023-11-08 DIAGNOSIS — F331 Major depressive disorder, recurrent, moderate: Secondary | ICD-10-CM | POA: Diagnosis not present

## 2023-11-08 DIAGNOSIS — G43719 Chronic migraine without aura, intractable, without status migrainosus: Secondary | ICD-10-CM | POA: Diagnosis not present

## 2023-11-14 NOTE — Telephone Encounter (Signed)
Submitted auth to [employer] via [facility], received approval.   Auth#: [id number] (11/14/23-11/13/24)

## 2023-11-22 DIAGNOSIS — F411 Generalized anxiety disorder: Secondary | ICD-10-CM | POA: Diagnosis not present

## 2023-11-22 DIAGNOSIS — F331 Major depressive disorder, recurrent, moderate: Secondary | ICD-10-CM | POA: Diagnosis not present

## 2023-11-23 DIAGNOSIS — F902 Attention-deficit hyperactivity disorder, combined type: Secondary | ICD-10-CM | POA: Diagnosis not present

## 2023-11-23 DIAGNOSIS — F3181 Bipolar II disorder: Secondary | ICD-10-CM | POA: Diagnosis not present

## 2023-11-24 ENCOUNTER — Other Ambulatory Visit: Payer: Self-pay | Admitting: Neurology

## 2023-11-24 ENCOUNTER — Encounter: Payer: Self-pay | Admitting: Neurology

## 2023-11-24 DIAGNOSIS — G43711 Chronic migraine without aura, intractable, with status migrainosus: Secondary | ICD-10-CM

## 2023-11-24 NOTE — Telephone Encounter (Signed)
 Rx was sent on 07/24/23 with 11 refills

## 2023-11-29 DIAGNOSIS — F331 Major depressive disorder, recurrent, moderate: Secondary | ICD-10-CM | POA: Diagnosis not present

## 2023-11-29 DIAGNOSIS — F411 Generalized anxiety disorder: Secondary | ICD-10-CM | POA: Diagnosis not present

## 2023-12-06 DIAGNOSIS — F411 Generalized anxiety disorder: Secondary | ICD-10-CM | POA: Diagnosis not present

## 2023-12-06 DIAGNOSIS — F331 Major depressive disorder, recurrent, moderate: Secondary | ICD-10-CM | POA: Diagnosis not present

## 2023-12-12 ENCOUNTER — Ambulatory Visit: Payer: Medicaid Other | Admitting: Neurology

## 2023-12-12 DIAGNOSIS — G43711 Chronic migraine without aura, intractable, with status migrainosus: Secondary | ICD-10-CM

## 2023-12-12 DIAGNOSIS — F331 Major depressive disorder, recurrent, moderate: Secondary | ICD-10-CM | POA: Diagnosis not present

## 2023-12-12 DIAGNOSIS — G8929 Other chronic pain: Secondary | ICD-10-CM

## 2023-12-12 DIAGNOSIS — F411 Generalized anxiety disorder: Secondary | ICD-10-CM | POA: Diagnosis not present

## 2023-12-12 DIAGNOSIS — M542 Cervicalgia: Secondary | ICD-10-CM

## 2023-12-12 MED ORDER — ONABOTULINUMTOXINA 200 UNITS IJ SOLR
155.0000 [IU] | Freq: Once | INTRAMUSCULAR | Status: AC
  Administered 2023-12-12: 155 [IU] via INTRAMUSCULAR

## 2023-12-12 NOTE — Progress Notes (Signed)
 12/12/2023: awesome Patient is stable, Botox has given her greater than 50% improvement in migraine frequency but she still had a burden of 8 migraine days a month and 15 total headache days a month so we started Ajovy in December.  Patient feels that her migraines cause her jaw to ache in her jaw aching also can cause her migraines to worsen it is a trigger for migraines included 5 units in each masseter to see if that helps with migraine severity.  Orders Placed This Encounter  Procedures   Ambulatory referral to Physical Therapy      Physical Therapy: Cervical myofascial pain, migraines, neck pain: forward posture contributing to migraines and cervicalgia. Please evaluate and treat including dry needling, stretching, strengthening, manual therapy/massage, heating, TENS unit, exercising for scapular stabilization, pectoral stretching and rhomboid strengthening as clinically warranted as well as any other modality as recommended by evaluation.  09/19/2023: stable. After the 2nd to 3rd month of ajovy see how she is doing and consider vyepti if needed.  06/27/2023:Patient is doing well > 50% improvement in migraine frequency but she still has 8 migraine days a month and 15 total headache days a month and feels she was much better controlled when she was on Ajovy and would like it prescribed again.  No orders of the defined types were placed in this encounter.   04/04/2023 sytable 01/03/2023: stable, saw Dr Neale Burly they took her insurance and even feeling better.   10/11/2022: Doing well on botox, > 50% improvement, She has 4-6 migriane days a month, < 10 total headache days a month, failed imitrex, maxalt and ubrelvy, zomig, relpax will try and get nurtec acutely approved. Dr Neale Burly doesn't take her insurance. Referral for pain clinic fax to [facility] (reminded her to contact Dr. Charyl Bigger) Phone: [phone], Fax:  [phone] for occipital neuralgia.    No orders of the  defined types were placed in this encounter.    07/14/2022: Doing well on botox for her migraines(>>50% relief of migraine severity, freq and duration with botox) but still with considerable pain in occipital headaches and nerve blocks are the only thing that helps her occipital headaches. [facility].  No orders of the defined types were placed in this encounter.    04/26/2022: emgality did not help. Ajovy not approved. Botox working great >> 50% improvement in migraine frequency. Needs acute management. Failed imitrex and maxalt 9work ok) Ablation of occipital nerves helped a lot. Now having 4 migraine days a month and < 10 total headache days a month needs a good acute management medication. Will try nurtec  No orders of the defined types were placed in this encounter.    01/26/2022: Botox gives > 50% relief but still having 8 migraine days a month and > 15 headache days a month will try Ajovy, emgality not helping, aimovig contraindicated due to constipation.     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 >50% improvement, she has a lot of neck pain, getting injections at [facility]. Resend to [facility], she requested some dry needling, loved Shanda Bumps.    Consent Form Botulism Toxin Injection For Chronic Migraine    Reviewed orally with patient, additionally signature is on file:  Botulism toxin has been approved by the Federal drug administration for treatment of chronic migraine. Botulism toxin does not cure chronic migraine and it may not be effective in some patients.  The administration of botulism toxin  is accomplished by injecting a small amount of toxin into the muscles of the neck and head. Dosage must be titrated for each individual. Any benefits resulting from botulism toxin tend to wear off after 3 months with a repeat injection required if benefit is to be maintained. Injections are  usually done every 3-4 months with maximum effect peak achieved by about 2 or 3 weeks. Botulism toxin is expensive and you should be sure of what costs you will incur resulting from the injection.  The side effects of botulism toxin use for chronic migraine may include:   -Transient, and usually mild, facial weakness with facial injections  -Transient, and usually mild, head or neck weakness with head/neck injections  -Reduction or loss of forehead facial animation due to forehead muscle weakness  -Eyelid drooping  -Dry eye  -Pain at the site of injection or bruising at the site of injection  -Double vision  -Potential unknown long term risks   Contraindications: You should not have Botox if you are pregnant, nursing, allergic to albumin, have an infection, skin condition, or muscle weakness at the site of the injection, or have myasthenia gravis, Lambert-Eaton syndrome, or ALS.  It is also possible that as with any injection, there may be an allergic reaction or no effect from the medication. Reduced effectiveness after repeated injections is sometimes seen and rarely infection at the injection site may occur. All care will be taken to prevent these side effects. If therapy is given over a long time, atrophy and wasting in the muscle injected may occur. Occasionally the patient's become refractory to treatment because they develop antibodies to the toxin. In this event, therapy needs to be modified.  I have read the above information and consent to the administration of botulism toxin.    BOTOX PROCEDURE NOTE FOR MIGRAINE HEADACHE  Contraindications and precautions discussed with patient(above). Aseptic rocedure was observed and patient tolerated procedure.   The condition has existed for more than 6 months, and pt does not have a diagnosis of ALS, Myasthenia Gravis or Lambert-Eaton Syndrome.  Risks and benefits of injections discussed and pt agrees to proceed with the procedure.  Written  consent obtained  These injections are medically necessary. Pt  receives good benefits from these injections. These injections do not cause sedations or hallucinations which the oral therapies may cause.   Description of procedure:  The patient was placed in a sitting position. The standard protocol was used for Botox as follows, with 5 units of Botox injected at each site:  -Procerus muscle, midline injection  -Corrugator muscle, bilateral injection  -Frontalis muscle, bilateral injection, with 2 sites each side, medial injection was performed in the upper one third of the frontalis muscle, in the region vertical from the medial inferior edge of the superior orbital rim. The lateral injection was again in the upper one third of the forehead vertically above the lateral limbus of the cornea, 1.5 cm lateral to the medial injection site.  -Temporalis muscle injection, 4 sites, bilaterally. The first injection was 3 cm above the tragus of the ear, second injection site was 1.5 cm to 3 cm up from the first injection site in line with the tragus of the ear. The third injection site was 1.5-3 cm forward between the first 2 injection sites. The fourth injection site was 1.5 cm posterior to the second injection site. 5th site laterally in the temporalis  muscleat the level of the outer canthus.  -Occipitalis muscle injection, 3 sites, bilaterally.  The first injection was done one half way between the occipital protuberance and the tip of the mastoid process behind the ear. The second injection site was done lateral and superior to the first, 1 fingerbreadth from the first injection. The third injection site was 1 fingerbreadth superiorly and medially from the first injection site.  -Cervical paraspinal muscle injection, 2 sites, bilaterally. The first injection site was 1 cm from the midline of the cervical spine, 3 cm inferior to the lower border of the occipital protuberance. The second injection site  was 1.5 cm superiorly and laterally to the first injection site.  -Trapezius muscle injection was performed at 3 sites, bilaterally. The first injection site was in the upper trapezius muscle halfway between the inflection point of the neck, and the acromion. The second injection site was one half way between the acromion and the first injection site. The third injection was done between the first injection site and the inflection point of the neck.  Will return for repeat injection in 3 months.   A total of 155 units of Botox was prepared, 45 units of Botox was injected as documented above, any Botox not injected was wasted. The patient tolerated the procedure well, there were no complications of the above procedure.

## 2023-12-12 NOTE — Progress Notes (Signed)
 Botox- 200 units x 1 vial Lot: [id number] Expiration: 02/2026 NDC: 4540-9811-91  Bacteriostatic 0.9% Sodium Chloride- 4 mL  Lot: [id number] Expiration: 07/27/2024 NDC: 6213-0865-78  Dx: I69.629 S/P  Witnessed by Deno Lunger CMA

## 2023-12-13 DIAGNOSIS — F331 Major depressive disorder, recurrent, moderate: Secondary | ICD-10-CM | POA: Diagnosis not present

## 2023-12-13 DIAGNOSIS — F411 Generalized anxiety disorder: Secondary | ICD-10-CM | POA: Diagnosis not present

## 2023-12-15 ENCOUNTER — Ambulatory Visit: Attending: Neurology | Admitting: Physical Therapy

## 2023-12-15 DIAGNOSIS — M25511 Pain in right shoulder: Secondary | ICD-10-CM | POA: Diagnosis not present

## 2023-12-15 DIAGNOSIS — G43711 Chronic migraine without aura, intractable, with status migrainosus: Secondary | ICD-10-CM | POA: Insufficient documentation

## 2023-12-15 DIAGNOSIS — M6281 Muscle weakness (generalized): Secondary | ICD-10-CM | POA: Insufficient documentation

## 2023-12-15 DIAGNOSIS — M25512 Pain in left shoulder: Secondary | ICD-10-CM | POA: Diagnosis not present

## 2023-12-15 DIAGNOSIS — M5459 Other low back pain: Secondary | ICD-10-CM | POA: Diagnosis not present

## 2023-12-15 DIAGNOSIS — M542 Cervicalgia: Secondary | ICD-10-CM | POA: Insufficient documentation

## 2023-12-15 DIAGNOSIS — G8929 Other chronic pain: Secondary | ICD-10-CM | POA: Insufficient documentation

## 2023-12-15 NOTE — Therapy (Signed)
 OUTPATIENT PHYSICAL THERAPY CERVICAL EVALUATION   Patient Name: Gail Blackburn MRN: [id number] DOB:12/10/1975, 48 y.o., female Today's Date: 12/15/2023  END OF SESSION:  PT End of Session - 12/15/23 0809     Visit Number 1    Number of Visits 13   with eval   Date for PT Re-Evaluation 02/09/24    Authorization Type Medicaid [employer] [place]    PT Start Time [phone]   pt arrived late, went to [facility]   PT Stop Time [phone]    PT Time Calculation (min) 44 min    Activity Tolerance Patient tolerated treatment well    Behavior During Therapy [facility] for tasks assessed/performed             Past Medical History:  Diagnosis Date   Anemia    PRIOR HISTORY   Anxiety    Asthma    Bipolar disorder (HCC)    Borderline   Depression    Fibromyalgia    Genital herpes simplex    Headache    MIGRAINES   Hepatic lesion 2022   interminate   Hypothyroidism 2009   TOOK MEDS FOR FEW WEEKS NO MEDS NOW   Migraine    Pancreatitis    Splenic vein thrombosis    Thyroid disease    treated in past not sure if too high or too low   Past Surgical History:  Procedure Laterality Date   COLONOSCOPY     DILATION AND EVACUATION N/A 05/06/2016   Procedure: DILATATION AND EVACUATION;  Surgeon: Huel Cote, MD;  Location: [facility];  Service: Gynecology;  Laterality: N/A;   FOOT SURGERY     TEETH PULLED  03/2016   WISDOM TOOTH EXTRACTION     Patient Active Problem List   Diagnosis Date Noted   Pancreatitis, acute 02/19/2023   Pancreatitis 02/19/2023   Abdominal pain 09/24/2022   Acute pancreatitis 09/24/2022   Neck pain 04/26/2021   Splenic vein thrombosis    Alcohol use disorder, moderate, dependence (HCC)    Alcohol-induced acute pancreatitis    Alcohol withdrawal syndrome without complication (HCC)    Hyponatremia    Pain of upper abdomen 10/28/2020   Tinea manuum 05/21/2019   Abdominal wall lump 02/21/2018   Chronic fatigue syndrome with fibromyalgia 02/24/2017   Skin lesion of left  leg 02/10/2017   History of abnormal cervical Pap smear 02/10/2017   Anxiety 10/15/2014   Depression 10/15/2014   Migraine 10/15/2014   Chronic pain 10/15/2014   Low TSH level 10/15/2014   Tobacco use disorder 10/15/2014    PCP: Ivery Quale, MD  REFERRING PROVIDER: Anson Fret, MD  REFERRING DIAG: [phone] (ICD-10-CM) - Chronic migraine without aura, with intractable migraine, so stated, with status migrainosus M54.2 (ICD-10-CM) - Cervicalgia M54.2,G89.29 (ICD-10-CM) - Chronic neck pain  THERAPY DIAG:  Muscle weakness (generalized)  Cervicalgia  Chronic pain of both shoulders  Other low back pain  Rationale for Evaluation and Treatment: Rehabilitation  ONSET DATE: 12/12/2023 (referral date)  SUBJECTIVE:  SUBJECTIVE STATEMENT: Pt reports a long history of migraines and reports that the severity of them varies but that they are stress-induced (depression, anxiety, stress all cause her migraines). Pt reports she also has neck pain and shooting pain down into her L shoulder blade and saw PT for this a few years ago. Pt reports she does have improved cervical ROM since working with PT last time. Pt reports that if she is active for 1.5-2 hours she will get the pain and migraines.  Pt reports she has tried muscle relaxers and they don't help her pain, uses a heating pad primarily. [facility] also [facility] 4 [facility] for trigger point injections. She has also had Botox injections as well.  Hand dominance: Right  PERTINENT HISTORY:  PMH: alcohol use disorder, opioid use disorder, bipolar disorder, migraines, GERD, h/o pancreatitis, ADHD  PAIN:  Are you having pain? Yes: NPRS scale: 5/10 Pain location: back of neck down into L shoulder, upper  traps Pain description: "deep, gnawing pain", fire, tension Aggravating factors: up and moving for 1.5-2 hours Relieving factors: heating pad  PRECAUTIONS: None  RED FLAGS: Cervical red flags: None      WEIGHT BEARING RESTRICTIONS: No  FALLS:  Has patient fallen in last 6 months? No  LIVING ENVIRONMENT: Lives with: lives with their family (moved back in with her mom)  OCCUPATION:  on disability  PLOF: Independent with gait and Independent with transfers  PATIENT GOALS: "I want my pain to go away", "be able to be up more 1.5 hours without having pain"  NEXT MD VISIT: Dr. Lucia Gaskins June 2025  OBJECTIVE:  Note: Objective measures were completed at Evaluation unless otherwise noted.  DIAGNOSTIC FINDINGS:  Most recent imaging in chart: Cervical Spine MRI 07/04/2021 IMPRESSION: Cervical spondylosis, as outlined and with findings most notably as follows.   At C6-C7, there is mild disc degeneration. Disc bulge with bilateral uncovertebral hypertrophy. Superimposed small right center/foraminal disc extrusion with slight cranial migration. The disc extrusion effaces the right ventral thecal sac, narrowing the right foraminal entry zone, with potential to affect the exiting right C7 nerve root. Lateral to this, there is moderate right neural foraminal narrowing. Moderate left neural foraminal narrowing.   At C5-C6, there is a shallow disc bulge. Superimposed tiny right center disc protrusion. Uncovertebral hypertrophy (greater on the right). No significant spinal canal stenosis. Mild relative right neural foraminal narrowing.   At C4-C5, there is a 5 x 3 mm central disc protrusion. The disc protrusion mildly narrows the spinal canal, contacting and minimally flattening the ventral spinal cord.   Straightening of the expected cervical lordosis.  PATIENT SURVEYS:  NDI 29/50 HDI: 100/100, 100% impairment  COGNITION: Overall cognitive status: Within functional limits for  tasks assessed  SENSATION: Occasional shooting pain down L arm, N/T occasionally, middle two fingers turn white  POSTURE: rounded shoulders and forward head  PALPATION: Tenderness in suboccipitals and R upper trap > L upper trap Shoulder blades widely spaced and TTP around shoulder blades  CERVICAL ROM:   Active ROM AROM (deg) eval  Flexion 35*  Extension 35*  Right lateral flexion 25*  Left lateral flexion 25* (Pain in R UT)  Right rotation 60*  Left rotation 50*   (Blank rows = not tested)  UPPER EXTREMITY ROM:  Active ROM Right eval Left eval  Shoulder flexion [facility]  Shoulder extension    Shoulder abduction [facility]  Shoulder adduction    Shoulder extension  Shoulder internal rotation    Shoulder external rotation    Elbow flexion    Elbow extension    Wrist flexion    Wrist extension    Wrist ulnar deviation    Wrist radial deviation    Wrist pronation    Wrist supination     (Blank rows = not tested)  UPPER EXTREMITY MMT:  MMT Right eval Left eval  Shoulder flexion 5 5  Shoulder extension    Shoulder abduction 5 5  Shoulder adduction    Shoulder extension    Shoulder internal rotation    Shoulder external rotation    Middle trapezius    Lower trapezius    Elbow flexion 5 5  Elbow extension 5 5  Wrist flexion    Wrist extension    Wrist ulnar deviation    Wrist radial deviation    Wrist pronation    Wrist supination    Grip strength Slightly decreased Slightly decreased   (Blank rows = not tested)  CERVICAL SPECIAL TESTS:  Upper limb tension test (ULTT): Positive Median nerve: + Ulnar nerve: - Radial nerve: -   TREATMENT: Not charged for treatment at initial eval due to insurance      Trigger Point Dry Needling  Initial Treatment: Pt instructed on Dry Needling rational, procedures, and possible side effects. Pt instructed to expect mild to moderate muscle soreness later in the day and/or into the next day.  Pt instructed in  methods to reduce muscle soreness. Pt instructed to continue prescribed HEP. Because Dry Needling was performed over or adjacent to a lung field, pt was educated on S/S of pneumothorax and to seek immediate medical attention should they occur.  Patient was educated on signs and symptoms of infection and other risk factors and advised to seek medical attention should they occur.  Patient verbalized understanding of these instructions and education.   Patient Verbal Consent Given: Yes Education Handout Provided: Yes Muscles Treated: R upper trap, L and R splenius and cervical paraspinals, L and R suboccipitals Electrical Stimulation Performed: No Treatment Response/Outcome: deep ache/pressure; muscle twitch detected  Trigger Point Dry Needling  What is Trigger Point Dry Needling (DN)? DN is a physical therapy technique used to treat muscle pain and dysfunction. Specifically, DN helps deactivate muscle trigger points (muscle knots).  A thin filiform needle is used to penetrate the skin and stimulate the underlying trigger point. The goal is for a local twitch response (LTR) to occur and for the trigger point to relax. No medication of any kind is injected during the procedure.   What Does Trigger Point Dry Needling Feel Like?  The procedure feels different for each individual patient. Some patients report that they do not actually feel the needle enter the skin and overall the process is not painful. Very mild bleeding may occur. However, many patients feel a deep cramping in the muscle in which the needle was inserted. This is the local twitch response.   How Will I feel after the treatment? Soreness is normal, and the onset of soreness may not occur for a few hours. Typically this soreness does not last longer than two days.  Bruising is uncommon, however; ice can be used to decrease any possible bruising.  In rare cases feeling tired or nauseous after the treatment is normal. In addition,  your symptoms may get worse before they get better, this period will typically not last longer than 24 hours.   What Can I do After My Treatment?  Increase your hydration by drinking more water for the next 24 hours.  You may place ice or heat on the areas treated that have become sore, however, do not use heat on inflamed or bruised areas. Heat often brings more relief post needling. You can continue your regular activities, but vigorous activity is not recommended initially after the treatment for 24 hours. DN is best combined with other physical therapy such as strengthening, stretching, and other therapies.   What are the complications? While your therapist has had extensive training in minimizing the risks of trigger point dry needling, it is important to understand the risks of any procedure.  Risks include bleeding, pain, fatigue, hematoma, infection, vertigo, nausea or nerve involvement. Monitor for any changes to your skin or sensation. Contact your therapist or MD with concerns.  A rare but serious complication is a pneumothorax over or near your middle and upper chest and back If you have dry needling in this area, monitor for the following symptoms: Shortness of breath on exertion and/or Difficulty taking a deep breath and/or Chest Pain and/or A dry cough If any of the above symptoms develop, please go to the nearest emergency room or call 911. Tell them you had dry needling over your thorax and report any symptoms you are having. Please follow-up with your treating therapist after you complete the medical evaluation.                                                                                                                           PATIENT EDUCATION:  Education details: Eval findings, PT POC, TPDN (patient has had this previously, handout provided) Person educated: Patient Education method: Explanation, Demonstration, and Handouts Education comprehension: verbalized  understanding, returned demonstration, and needs further education  HOME EXERCISE PROGRAM: To be initiated  ASSESSMENT:  CLINICAL IMPRESSION: Patient is a 48 year old female referred to Neuro OPPT for cervicalgia and chronic migraines.   Pt's PMH is significant for: alcohol use disorder, opioid use disorder, bipolar disorder, migraines, GERD, h/o pancreatitis, ADHD. The following deficits were present during the exam: decreased cervical ROM, mildly impaired grip strength, positive median nerve compression in LUE, TTP in cervical and shoulder muscles, and postural dysfunction with wide-set scapula. Pt would benefit from skilled PT to address these impairments and functional limitations to maximize functional mobility independence and increase independent with management of her pain symptoms.   OBJECTIVE IMPAIRMENTS: decreased activity tolerance, decreased ROM, decreased strength, impaired perceived functional ability, improper body mechanics, postural dysfunction, and pain.   ACTIVITY LIMITATIONS: carrying and lifting  PARTICIPATION LIMITATIONS:  unable to work due to chronic pain and migraines  PERSONAL FACTORS: Time since onset of injury/illness/exacerbation and 3+ comorbidities:    alcohol use disorder, opioid use disorder, bipolar disorder, migraines, GERD, h/o pancreatitis, ADHDare also affecting patient's functional outcome.   REHAB POTENTIAL: Good  CLINICAL DECISION MAKING: Stable/uncomplicated  EVALUATION COMPLEXITY: Low   GOALS: Goals reviewed with patient? Yes  SHORT TERM  GOALS: Target date: 01/05/2024  Pt will be independent with initial HEP for improved cervical ROM, improved posture and management of pain symptoms in order to build upon functional gains made in therapy. Baseline:  Goal status: INITIAL  LONG TERM GOALS: Target date: 01/26/2024   Pt will be independent with final HEP for improved cervical ROM, improved posture and management of pain symptoms in order to  build upon functional gains made in therapy. Baseline:  Goal status: INITIAL  2.  Pt will improve her score on the NDI to 24/50 to demonstrate improved function and decreased disability level. Baseline: 29/50 (3/21) Goal status: INITIAL  3.  Pt will improve her score on the HDI to 80/100 to demonstrate improved function and decreased disability level. Baseline: 100/100 (3/21) Goal status: INITIAL  4.  Pt will improve her cervical ROM in limited motions by >/= 10 degrees for improved function. Baseline:  Active ROM AROM (deg) eval  Flexion 35*  Extension 35*  Right lateral flexion 25*  Left lateral flexion 25* (Pain in R UT)  Right rotation 60*  Left rotation 50*   Goal status: INITIAL     PLAN:  PT FREQUENCY: 2x/week  PT DURATION: 6 weeks  PLANNED INTERVENTIONS: 97164- PT Re-evaluation, 97110-Therapeutic exercises, 97530- Therapeutic activity, 97112- Neuromuscular re-education, 97535- Self Care, 52841- Manual therapy, [id number]- Electrical stimulation (manual), Patient/Family education, Taping, Dry Needling, Joint mobilization, Spinal mobilization, Cryotherapy, and Moist heat  PLAN FOR NEXT SESSION: TPDN around scapula, median nerve glide, rhomboid strengthening, prone postural stabilization   Peter Congo, PT Peter Congo, PT, DPT, CSRS  For all possible CPT codes, reference the Planned Interventions line above.     Check all conditions that are expected to impact treatment: {Conditions expected to impact treatment:Psychological or psychiatric disorders   If treatment provided at initial evaluation, no treatment charged due to lack of authorization.      12/15/2023, 8:52 AM

## 2023-12-19 ENCOUNTER — Ambulatory Visit: Admitting: Physical Therapy

## 2023-12-19 DIAGNOSIS — G8929 Other chronic pain: Secondary | ICD-10-CM | POA: Diagnosis not present

## 2023-12-19 DIAGNOSIS — M5459 Other low back pain: Secondary | ICD-10-CM

## 2023-12-19 DIAGNOSIS — M25511 Pain in right shoulder: Secondary | ICD-10-CM | POA: Diagnosis not present

## 2023-12-19 DIAGNOSIS — M542 Cervicalgia: Secondary | ICD-10-CM | POA: Diagnosis not present

## 2023-12-19 DIAGNOSIS — M6281 Muscle weakness (generalized): Secondary | ICD-10-CM | POA: Diagnosis not present

## 2023-12-19 DIAGNOSIS — M25512 Pain in left shoulder: Secondary | ICD-10-CM | POA: Diagnosis not present

## 2023-12-19 DIAGNOSIS — G43711 Chronic migraine without aura, intractable, with status migrainosus: Secondary | ICD-10-CM | POA: Diagnosis not present

## 2023-12-19 NOTE — Therapy (Signed)
 OUTPATIENT PHYSICAL THERAPY CERVICAL TREATMENT   Patient Name: Gail Blackburn MRN: [id number] DOB:[birth year], 48 y.o., female Today's Date: 12/19/2023  END OF SESSION:  PT End of Session - 12/19/23 0804     Visit Number 2    Number of Visits 13   with eval   Date for PT Re-Evaluation 02/09/24    Authorization Type Medicaid [employer] [place]    PT Start Time [phone]   pt arrived late   PT Stop Time 0842    PT Time Calculation (min) 38 min    Activity Tolerance Patient tolerated treatment well    Behavior During Therapy WFL for tasks assessed/performed              Past Medical History:  Diagnosis Date   Anemia    PRIOR HISTORY   Anxiety    Asthma    Bipolar disorder (HCC)    Borderline   Depression    Fibromyalgia    Genital herpes simplex    Headache    MIGRAINES   Hepatic lesion 2022   interminate   Hypothyroidism 2009   TOOK MEDS FOR FEW WEEKS NO MEDS NOW   Migraine    Pancreatitis    Splenic vein thrombosis    Thyroid disease    treated in past not sure if too high or too low   Past Surgical History:  Procedure Laterality Date   COLONOSCOPY     DILATION AND EVACUATION N/A 05/06/2016   Procedure: DILATATION AND EVACUATION;  Surgeon: Huel Cote, MD;  Location: [facility];  Service: Gynecology;  Laterality: N/A;   FOOT SURGERY     TEETH PULLED  03/2016   WISDOM TOOTH EXTRACTION     Patient Active Problem List   Diagnosis Date Noted   Pancreatitis, acute 02/19/2023   Pancreatitis 02/19/2023   Abdominal pain 09/24/2022   Acute pancreatitis 09/24/2022   Neck pain 04/26/2021   Splenic vein thrombosis    Alcohol use disorder, moderate, dependence (HCC)    Alcohol-induced acute pancreatitis    Alcohol withdrawal syndrome without complication (HCC)    Hyponatremia    Pain of upper abdomen 10/28/2020   Tinea manuum 05/21/2019   Abdominal wall lump 02/21/2018   Chronic fatigue syndrome with fibromyalgia 02/24/2017   Skin lesion of left leg 02/10/2017    History of abnormal cervical Pap smear 02/10/2017   Anxiety 10/15/2014   Depression 10/15/2014   Migraine 10/15/2014   Chronic pain 10/15/2014   Low TSH level 10/15/2014   Tobacco use disorder 10/15/2014    PCP: Ivery Quale, MD  REFERRING PROVIDER: Anson Fret, MD  REFERRING DIAG: [phone] (ICD-10-CM) - Chronic migraine without aura, with intractable migraine, so stated, with status migrainosus M54.2 (ICD-10-CM) - Cervicalgia M54.2,G89.29 (ICD-10-CM) - Chronic neck pain  THERAPY DIAG:  Muscle weakness (generalized)  Cervicalgia  Chronic pain of both shoulders  Other low back pain  Rationale for Evaluation and Treatment: Rehabilitation  ONSET DATE: 12/12/2023 (referral date)  SUBJECTIVE:  SUBJECTIVE STATEMENT: Pt reports that she felt really good after DN during initial eval. Pt reports that today her neck pain is 5/10, back pain 6/10.  Hand dominance: Right  PERTINENT HISTORY:  PMH: alcohol use disorder, opioid use disorder, bipolar disorder, migraines, GERD, h/o pancreatitis, ADHD  PAIN:  Are you having pain? Yes: NPRS scale: 5/10, 6/10 low back Pain location: back of neck down into L shoulder, upper traps Pain description: "deep, gnawing pain", fire, tension Aggravating factors: up and moving for 1.5-2 hours Relieving factors: heating pad  PRECAUTIONS: None  RED FLAGS: Cervical red flags: None      WEIGHT BEARING RESTRICTIONS: No  FALLS:  Has patient fallen in last 6 months? No  LIVING ENVIRONMENT: Lives with: lives with their family (moved back in with her mom)  OCCUPATION:  on disability  PLOF: Independent with gait and Independent with transfers  PATIENT GOALS: "I want my pain to go away", "be able to be up more 1.5 hours without having pain"  NEXT  MD VISIT: Dr. Lucia Gaskins June 2025  OBJECTIVE:  Note: Objective measures were completed at Evaluation unless otherwise noted.  DIAGNOSTIC FINDINGS:  Most recent imaging in chart: Cervical Spine MRI 07/04/2021 IMPRESSION: Cervical spondylosis, as outlined and with findings most notably as follows.   At C6-C7, there is mild disc degeneration. Disc bulge with bilateral uncovertebral hypertrophy. Superimposed small right center/foraminal disc extrusion with slight cranial migration. The disc extrusion effaces the right ventral thecal sac, narrowing the right foraminal entry zone, with potential to affect the exiting right C7 nerve root. Lateral to this, there is moderate right neural foraminal narrowing. Moderate left neural foraminal narrowing.   At C5-C6, there is a shallow disc bulge. Superimposed tiny right center disc protrusion. Uncovertebral hypertrophy (greater on the right). No significant spinal canal stenosis. Mild relative right neural foraminal narrowing.   At C4-C5, there is a 5 x 3 mm central disc protrusion. The disc protrusion mildly narrows the spinal canal, contacting and minimally flattening the ventral spinal cord.   Straightening of the expected cervical lordosis.  PATIENT SURVEYS:  NDI 29/50 HDI: 100/100, 100% impairment  COGNITION: Overall cognitive status: Within functional limits for tasks assessed  SENSATION: Occasional shooting pain down L arm, N/T occasionally, middle two fingers turn white  POSTURE: rounded shoulders and forward head  PALPATION: Tenderness in suboccipitals and R upper trap > L upper trap Shoulder blades widely spaced and TTP around shoulder blades  CERVICAL ROM:   Active ROM AROM (deg) eval  Flexion 35*  Extension 35*  Right lateral flexion 25*  Left lateral flexion 25* (Pain in R UT)  Right rotation 60*  Left rotation 50*   (Blank rows = not tested)  UPPER EXTREMITY ROM:  Active ROM Right eval Left eval  Shoulder  flexion [facility]  Shoulder extension    Shoulder abduction [facility]  Shoulder adduction    Shoulder extension    Shoulder internal rotation    Shoulder external rotation    Elbow flexion    Elbow extension    Wrist flexion    Wrist extension    Wrist ulnar deviation    Wrist radial deviation    Wrist pronation    Wrist supination     (Blank rows = not tested)  UPPER EXTREMITY MMT:  MMT Right eval Left eval  Shoulder flexion 5 5  Shoulder extension    Shoulder abduction 5 5  Shoulder adduction    Shoulder extension    Shoulder  internal rotation    Shoulder external rotation    Middle trapezius    Lower trapezius    Elbow flexion 5 5  Elbow extension 5 5  Wrist flexion    Wrist extension    Wrist ulnar deviation    Wrist radial deviation    Wrist pronation    Wrist supination    Grip strength Slightly decreased Slightly decreased   (Blank rows = not tested)  CERVICAL SPECIAL TESTS:  Upper limb tension test (ULTT): Positive Median nerve: + Ulnar nerve: - Radial nerve: -   TREATMENT:  TherAct Trigger Point Dry Needling  Subsequent Treatment: Instructions provided previously at initial dry needling treatment.   Patient Verbal Consent Given: Yes Education Handout Provided: Yes Muscles Treated: L rhomboids, L subscapularis, L lats, L teres major and minor Electrical Stimulation Performed: No Treatment Response/Outcome: deep ache/pressure; muscle twitch detected                                                                                                                        TherEx To work on strengthening of postural stabilization muscles: Prone therex Tried shoulder flexion and scaption, difficult Horizontal abduction x 10 reps Seated therex Seated shoulder flexion with cervical retraction x 10 reps Seated shoulder abduction with cervical retraction x 10 reps Scapular retraction x 10 reps with red TB Added to HEP, see bolded below   PATIENT  EDUCATION:  Education details: TPDN, initial HEP Person educated: Patient Education method: Explanation, Demonstration, and Handouts Education comprehension: verbalized understanding, returned demonstration, and needs further education  HOME EXERCISE PROGRAM: Access Code: [email or web address] URL: [email or web address] Date: 12/19/2023 Prepared by: Peter Congo  Exercises - Scapular Retraction with Resistance  - 1 x daily - 7 x weekly - 3 sets - 10 reps - Standing Shoulder Flexion Full Range  - 1 x daily - 7 x weekly - 3 sets - 10 reps - Standing Shoulder Scaption  - 1 x daily - 7 x weekly - 3 sets - 10 reps  ASSESSMENT:  CLINICAL IMPRESSION: Emphasis of skilled PT session on performing TPDN around L scapula to address ongoing pain and muscle tightness as well as initiating HEP to work on postural stabilization. Pt with good response to DN during initial eval and good response this session. Pt does exhibit weakness in her postural muscles with difficulty performing exercises in prone position against gravity. Pt continues to benefit from skilled PT services to work towards LTGs. Continue POC.    OBJECTIVE IMPAIRMENTS: decreased activity tolerance, decreased ROM, decreased strength, impaired perceived functional ability, improper body mechanics, postural dysfunction, and pain.   ACTIVITY LIMITATIONS: carrying and lifting  PARTICIPATION LIMITATIONS:  unable to work due to chronic pain and migraines  PERSONAL FACTORS: Time since onset of injury/illness/exacerbation and 3+ comorbidities:    alcohol use disorder, opioid use disorder, bipolar disorder, migraines, GERD, h/o pancreatitis, ADHDare also affecting patient's functional outcome.   REHAB POTENTIAL: Good  CLINICAL DECISION MAKING: Stable/uncomplicated  EVALUATION COMPLEXITY: Low  GOALS: Goals reviewed with patient? Yes  SHORT TERM GOALS: Target date: 01/05/2024  Pt will be independent with initial HEP for improved  cervical ROM, improved posture and management of pain symptoms in order to build upon functional gains made in therapy. Baseline:  Goal status: INITIAL  LONG TERM GOALS: Target date: 01/26/2024   Pt will be independent with final HEP for improved cervical ROM, improved posture and management of pain symptoms in order to build upon functional gains made in therapy. Baseline:  Goal status: INITIAL  2.  Pt will improve her score on the NDI to 24/50 to demonstrate improved function and decreased disability level. Baseline: 29/50 (3/21) Goal status: INITIAL  3.  Pt will improve her score on the HDI to 80/100 to demonstrate improved function and decreased disability level. Baseline: 100/100 (3/21) Goal status: INITIAL  4.  Pt will improve her cervical ROM in limited motions by >/= 10 degrees for improved function. Baseline:  Active ROM AROM (deg) eval  Flexion 35*  Extension 35*  Right lateral flexion 25*  Left lateral flexion 25* (Pain in R UT)  Right rotation 60*  Left rotation 50*   Goal status: INITIAL     PLAN:  PT FREQUENCY: 2x/week  PT DURATION: 6 weeks  PLANNED INTERVENTIONS: 97164- PT Re-evaluation, 97110-Therapeutic exercises, 97530- Therapeutic activity, [id number]- Neuromuscular re-education, 97535- Self Care, 16109- Manual therapy, [id number]- Electrical stimulation (manual), Patient/Family education, Taping, Dry Needling, Joint mobilization, Spinal mobilization, Cryotherapy, and Moist heat  PLAN FOR NEXT SESSION: how is initial HEP? TPDN around scapula and cervical, UT, median nerve glide, rhomboid strengthening, postural stabilization   Peter Congo, PT Peter Congo, PT, DPT, CSRS  For all possible CPT codes, reference the Planned Interventions line above.     Check all conditions that are expected to impact treatment: {Conditions expected to impact treatment:Psychological or psychiatric disorders   If treatment provided at initial evaluation, no treatment  charged due to lack of authorization.      12/19/2023, 8:42 AM

## 2023-12-22 ENCOUNTER — Ambulatory Visit: Admitting: Physical Therapy

## 2023-12-22 DIAGNOSIS — M25511 Pain in right shoulder: Secondary | ICD-10-CM | POA: Diagnosis not present

## 2023-12-22 DIAGNOSIS — M5459 Other low back pain: Secondary | ICD-10-CM | POA: Diagnosis not present

## 2023-12-22 DIAGNOSIS — M6281 Muscle weakness (generalized): Secondary | ICD-10-CM

## 2023-12-22 DIAGNOSIS — M542 Cervicalgia: Secondary | ICD-10-CM

## 2023-12-22 DIAGNOSIS — G8929 Other chronic pain: Secondary | ICD-10-CM | POA: Diagnosis not present

## 2023-12-22 DIAGNOSIS — M25512 Pain in left shoulder: Secondary | ICD-10-CM | POA: Diagnosis not present

## 2023-12-22 DIAGNOSIS — G43711 Chronic migraine without aura, intractable, with status migrainosus: Secondary | ICD-10-CM | POA: Diagnosis not present

## 2023-12-22 NOTE — Therapy (Signed)
 OUTPATIENT PHYSICAL THERAPY CERVICAL TREATMENT   Patient Name: Gail Blackburn MRN: [id number] DOB:1976/09/20, 48 y.o., female Today's Date: 12/22/2023  END OF SESSION:  PT End of Session - 12/22/23 0802     Visit Number 3    Number of Visits 13   with eval   Date for PT Re-Evaluation 02/09/24    Authorization Type Medicaid [employer] [place]    PT Start Time 0800    PT Stop Time 0840    PT Time Calculation (min) 40 min    Activity Tolerance Patient tolerated treatment well    Behavior During Therapy WFL for tasks assessed/performed               Past Medical History:  Diagnosis Date   Anemia    PRIOR HISTORY   Anxiety    Asthma    Bipolar disorder (HCC)    Borderline   Depression    Fibromyalgia    Genital herpes simplex    Headache    MIGRAINES   Hepatic lesion 2022   interminate   Hypothyroidism 2009   TOOK MEDS FOR FEW WEEKS NO MEDS NOW   Migraine    Pancreatitis    Splenic vein thrombosis    Thyroid disease    treated in past not sure if too high or too low   Past Surgical History:  Procedure Laterality Date   COLONOSCOPY     DILATION AND EVACUATION N/A 05/06/2016   Procedure: DILATATION AND EVACUATION;  Surgeon: Huel Cote, MD;  Location: [facility];  Service: Gynecology;  Laterality: N/A;   FOOT SURGERY     TEETH PULLED  03/2016   WISDOM TOOTH EXTRACTION     Patient Active Problem List   Diagnosis Date Noted   Pancreatitis, acute 02/19/2023   Pancreatitis 02/19/2023   Abdominal pain 09/24/2022   Acute pancreatitis 09/24/2022   Neck pain 04/26/2021   Splenic vein thrombosis    Alcohol use disorder, moderate, dependence (HCC)    Alcohol-induced acute pancreatitis    Alcohol withdrawal syndrome without complication (HCC)    Hyponatremia    Pain of upper abdomen 10/28/2020   Tinea manuum 05/21/2019   Abdominal wall lump 02/21/2018   Chronic fatigue syndrome with fibromyalgia 02/24/2017   Skin lesion of left leg 02/10/2017   History of abnormal  cervical Pap smear 02/10/2017   Anxiety 10/15/2014   Depression 10/15/2014   Migraine 10/15/2014   Chronic pain 10/15/2014   Low TSH level 10/15/2014   Tobacco use disorder 10/15/2014    PCP: Ivery Quale, MD  REFERRING PROVIDER: Anson Fret, MD  REFERRING DIAG: [phone] (ICD-10-CM) - Chronic migraine without aura, with intractable migraine, so stated, with status migrainosus M54.2 (ICD-10-CM) - Cervicalgia M54.2,G89.29 (ICD-10-CM) - Chronic neck pain  THERAPY DIAG:  Muscle weakness (generalized)  Chronic pain of both shoulders  Cervicalgia  Other low back pain  Rationale for Evaluation and Treatment: Rehabilitation  ONSET DATE: 12/12/2023 (referral date)  SUBJECTIVE:  SUBJECTIVE STATEMENT: Pt continues to feel better, did have a migraine that is still ongoing but it is better than it has been. Pt admits she has not worked on her stretches or exercises since last visit.           Hand dominance: Right  PERTINENT HISTORY:  PMH: alcohol use disorder, opioid use disorder, bipolar disorder, migraines, GERD, h/o pancreatitis, ADHD  PAIN:  Are you having pain? Yes: NPRS scale: 6/10 Pain location: back of neck down into L shoulder, upper traps Pain description: "deep, gnawing pain", fire, tension Aggravating factors: up and moving for 1.5-2 hours Relieving factors: heating pad  PRECAUTIONS: None  RED FLAGS: Cervical red flags: None      WEIGHT BEARING RESTRICTIONS: No  FALLS:  Has patient fallen in last 6 months? No  LIVING ENVIRONMENT: Lives with: lives with their family (moved back in with her mom)  OCCUPATION:  on disability  PLOF: Independent with gait and Independent with transfers  PATIENT GOALS: "I want my pain to go away", "be able to be up more 1.5 hours  without having pain"  NEXT MD VISIT: Dr. Lucia Gaskins June 2025  OBJECTIVE:  Note: Objective measures were completed at Evaluation unless otherwise noted.  DIAGNOSTIC FINDINGS:  Most recent imaging in chart: Cervical Spine MRI 07/04/2021 IMPRESSION: Cervical spondylosis, as outlined and with findings most notably as follows.   At C6-C7, there is mild disc degeneration. Disc bulge with bilateral uncovertebral hypertrophy. Superimposed small right center/foraminal disc extrusion with slight cranial migration. The disc extrusion effaces the right ventral thecal sac, narrowing the right foraminal entry zone, with potential to affect the exiting right C7 nerve root. Lateral to this, there is moderate right neural foraminal narrowing. Moderate left neural foraminal narrowing.   At C5-C6, there is a shallow disc bulge. Superimposed tiny right center disc protrusion. Uncovertebral hypertrophy (greater on the right). No significant spinal canal stenosis. Mild relative right neural foraminal narrowing.   At C4-C5, there is a 5 x 3 mm central disc protrusion. The disc protrusion mildly narrows the spinal canal, contacting and minimally flattening the ventral spinal cord.   Straightening of the expected cervical lordosis.  PATIENT SURVEYS:  NDI 29/50 HDI: 100/100, 100% impairment  COGNITION: Overall cognitive status: Within functional limits for tasks assessed  SENSATION: Occasional shooting pain down L arm, N/T occasionally, middle two fingers turn white  POSTURE: rounded shoulders and forward head  PALPATION: Tenderness in suboccipitals and R upper trap > L upper trap Shoulder blades widely spaced and TTP around shoulder blades  CERVICAL ROM:   Active ROM AROM (deg) eval  Flexion 35*  Extension 35*  Right lateral flexion 25*  Left lateral flexion 25* (Pain in R UT)  Right rotation 60*  Left rotation 50*   (Blank rows = not tested)  UPPER EXTREMITY ROM:  Active ROM  Right eval Left eval  Shoulder flexion [facility]  Shoulder extension    Shoulder abduction [facility]  Shoulder adduction    Shoulder extension    Shoulder internal rotation    Shoulder external rotation    Elbow flexion    Elbow extension    Wrist flexion    Wrist extension    Wrist ulnar deviation    Wrist radial deviation    Wrist pronation    Wrist supination     (Blank rows = not tested)  UPPER EXTREMITY MMT:  MMT Right eval Left eval  Shoulder flexion 5 5  Shoulder extension  Shoulder abduction 5 5  Shoulder adduction    Shoulder extension    Shoulder internal rotation    Shoulder external rotation    Middle trapezius    Lower trapezius    Elbow flexion 5 5  Elbow extension 5 5  Wrist flexion    Wrist extension    Wrist ulnar deviation    Wrist radial deviation    Wrist pronation    Wrist supination    Grip strength Slightly decreased Slightly decreased   (Blank rows = not tested)  CERVICAL SPECIAL TESTS:  Upper limb tension test (ULTT): Positive Median nerve: + Ulnar nerve: - Radial nerve: -   TREATMENT:  TherAct Trigger Point Dry Needling  Subsequent Treatment: Instructions provided previously at initial dry needling treatment.   Patient Verbal Consent Given: Yes Education Handout Provided: Yes Muscles Treated: L and R suboccipitals, L and R cervical paraspinals and splenius, L rhomboids and upper trap/middle trap Electrical Stimulation Performed: No Treatment Response/Outcome: deep ache/pressure; muscle twitch detected                                                                                                                        TherEx To address rhomboid and lat tightness L>R: Child's pose with thread the needle 3 x 30 sec each Seated anterior leans on blue Swiss ball 3 x 30 sec each  Added to HEP, see bolded below    PATIENT EDUCATION:  Education details: TPDN, continue HEP and added to HEP Person educated:  Patient Education method: Explanation, Demonstration, and Handouts Education comprehension: verbalized understanding, returned demonstration, and needs further education  HOME EXERCISE PROGRAM: Access Code: [email or web address] URL: [email or web address] Date: 12/19/2023 Prepared by: Peter Congo  Exercises - Scapular Retraction with Resistance  - 1 x daily - 7 x weekly - 3 sets - 10 reps - Standing Shoulder Flexion Full Range  - 1 x daily - 7 x weekly - 3 sets - 10 reps - Standing Shoulder Scaption  - 1 x daily - 7 x weekly - 3 sets - 10 reps - Child's Pose with Thread the Needle  - 1 x daily - 7 x weekly - 1 sets - 3-5 reps - 30 sec hold  ASSESSMENT:  CLINICAL IMPRESSION: Emphasis of skilled PT session on performing DN to address ongoing pain and muscle tightness in cervical and upper-mid shoulder region. Pt with good relief of symptoms with DN. Followed DN with stretching of rhomboid and mid-trap region due to ongoing trigger points in this area. Pt continues to benefit from skilled PT services to work towards LTGs. Continue POC.    OBJECTIVE IMPAIRMENTS: decreased activity tolerance, decreased ROM, decreased strength, impaired perceived functional ability, improper body mechanics, postural dysfunction, and pain.   ACTIVITY LIMITATIONS: carrying and lifting  PARTICIPATION LIMITATIONS:  unable to work due to chronic pain and migraines  PERSONAL FACTORS: Time since onset of injury/illness/exacerbation and 3+ comorbidities:    alcohol use disorder, opioid use disorder, bipolar disorder, migraines, GERD,  h/o pancreatitis, ADHDare also affecting patient's functional outcome.   REHAB POTENTIAL: Good  CLINICAL DECISION MAKING: Stable/uncomplicated  EVALUATION COMPLEXITY: Low   GOALS: Goals reviewed with patient? Yes  SHORT TERM GOALS: Target date: 01/05/2024  Pt will be independent with initial HEP for improved cervical ROM, improved posture and management of pain symptoms  in order to build upon functional gains made in therapy. Baseline:  Goal status: INITIAL  LONG TERM GOALS: Target date: 01/26/2024   Pt will be independent with final HEP for improved cervical ROM, improved posture and management of pain symptoms in order to build upon functional gains made in therapy. Baseline:  Goal status: INITIAL  2.  Pt will improve her score on the NDI to 24/50 to demonstrate improved function and decreased disability level. Baseline: 29/50 (3/21) Goal status: INITIAL  3.  Pt will improve her score on the HDI to 80/100 to demonstrate improved function and decreased disability level. Baseline: 100/100 (3/21) Goal status: INITIAL  4.  Pt will improve her cervical ROM in limited motions by >/= 10 degrees for improved function. Baseline:  Active ROM AROM (deg) eval  Flexion 35*  Extension 35*  Right lateral flexion 25*  Left lateral flexion 25* (Pain in R UT)  Right rotation 60*  Left rotation 50*   Goal status: INITIAL     PLAN:  PT FREQUENCY: 2x/week  PT DURATION: 6 weeks  PLANNED INTERVENTIONS: 97164- PT Re-evaluation, 97110-Therapeutic exercises, 97530- Therapeutic activity, [id number]- Neuromuscular re-education, 97535- Self Care, 40981- Manual therapy, [id number]- Electrical stimulation (manual), Patient/Family education, Taping, Dry Needling, Joint mobilization, Spinal mobilization, Cryotherapy, and Moist heat  PLAN FOR NEXT SESSION: how is HEP? TPDN around scapula and cervical, UT, median nerve glide, rhomboid strengthening, postural stabilization; suboccipital release and distraction/traction?   Peter Congo, PT Peter Congo, PT, DPT, CSRS  For all possible CPT codes, reference the Planned Interventions line above.     Check all conditions that are expected to impact treatment: {Conditions expected to impact treatment:Psychological or psychiatric disorders   If treatment provided at initial evaluation, no treatment charged due to lack of  authorization.      12/22/2023, 8:45 AM

## 2023-12-25 DIAGNOSIS — M791 Myalgia, unspecified site: Secondary | ICD-10-CM | POA: Diagnosis not present

## 2023-12-25 DIAGNOSIS — F331 Major depressive disorder, recurrent, moderate: Secondary | ICD-10-CM | POA: Diagnosis not present

## 2023-12-25 DIAGNOSIS — F411 Generalized anxiety disorder: Secondary | ICD-10-CM | POA: Diagnosis not present

## 2023-12-25 DIAGNOSIS — G43719 Chronic migraine without aura, intractable, without status migrainosus: Secondary | ICD-10-CM | POA: Diagnosis not present

## 2023-12-25 DIAGNOSIS — G518 Other disorders of facial nerve: Secondary | ICD-10-CM | POA: Diagnosis not present

## 2023-12-25 DIAGNOSIS — M542 Cervicalgia: Secondary | ICD-10-CM | POA: Diagnosis not present

## 2023-12-26 DIAGNOSIS — Z124 Encounter for screening for malignant neoplasm of cervix: Secondary | ICD-10-CM | POA: Diagnosis not present

## 2023-12-26 DIAGNOSIS — F411 Generalized anxiety disorder: Secondary | ICD-10-CM | POA: Diagnosis not present

## 2023-12-26 DIAGNOSIS — F331 Major depressive disorder, recurrent, moderate: Secondary | ICD-10-CM | POA: Diagnosis not present

## 2023-12-26 DIAGNOSIS — Z01419 Encounter for gynecological examination (general) (routine) without abnormal findings: Secondary | ICD-10-CM | POA: Diagnosis not present

## 2023-12-26 DIAGNOSIS — Z1389 Encounter for screening for other disorder: Secondary | ICD-10-CM | POA: Diagnosis not present

## 2023-12-26 DIAGNOSIS — Z3041 Encounter for surveillance of contraceptive pills: Secondary | ICD-10-CM | POA: Diagnosis not present

## 2023-12-26 DIAGNOSIS — Z113 Encounter for screening for infections with a predominantly sexual mode of transmission: Secondary | ICD-10-CM | POA: Diagnosis not present

## 2023-12-26 DIAGNOSIS — Z1231 Encounter for screening mammogram for malignant neoplasm of breast: Secondary | ICD-10-CM | POA: Diagnosis not present

## 2023-12-26 DIAGNOSIS — Z1151 Encounter for screening for human papillomavirus (HPV): Secondary | ICD-10-CM | POA: Diagnosis not present

## 2023-12-27 ENCOUNTER — Ambulatory Visit: Attending: Neurology | Admitting: Physical Therapy

## 2023-12-27 DIAGNOSIS — M542 Cervicalgia: Secondary | ICD-10-CM | POA: Insufficient documentation

## 2023-12-27 DIAGNOSIS — M25512 Pain in left shoulder: Secondary | ICD-10-CM | POA: Diagnosis not present

## 2023-12-27 DIAGNOSIS — G8929 Other chronic pain: Secondary | ICD-10-CM | POA: Insufficient documentation

## 2023-12-27 DIAGNOSIS — M5459 Other low back pain: Secondary | ICD-10-CM | POA: Insufficient documentation

## 2023-12-27 DIAGNOSIS — M6281 Muscle weakness (generalized): Secondary | ICD-10-CM | POA: Insufficient documentation

## 2023-12-27 DIAGNOSIS — M25511 Pain in right shoulder: Secondary | ICD-10-CM | POA: Insufficient documentation

## 2023-12-27 NOTE — Therapy (Signed)
 OUTPATIENT PHYSICAL THERAPY CERVICAL TREATMENT   Patient Name: Gail Blackburn MRN: [id number] DOB:1976-09-23, 48 y.o., female Today's Date: 12/27/2023  END OF SESSION:  PT End of Session - 12/27/23 0801     Visit Number 4    Number of Visits 13   with eval   Date for PT Re-Evaluation 02/09/24    Authorization Type Medicaid [employer]    PT Start Time 0800    PT Stop Time 0840    PT Time Calculation (min) 40 min    Activity Tolerance Patient tolerated treatment well    Behavior During Therapy WFL for tasks assessed/performed                Past Medical History:  Diagnosis Date   Anemia    PRIOR HISTORY   Anxiety    Asthma    Bipolar disorder (HCC)    Borderline   Depression    Fibromyalgia    Genital herpes simplex    Headache    MIGRAINES   Hepatic lesion 2022   interminate   Hypothyroidism 2009   TOOK MEDS FOR FEW WEEKS NO MEDS NOW   Migraine    Pancreatitis    Splenic vein thrombosis    Thyroid disease    treated in past not sure if too high or too low   Past Surgical History:  Procedure Laterality Date   COLONOSCOPY     DILATION AND EVACUATION N/A 05/06/2016   Procedure: DILATATION AND EVACUATION;  Surgeon: Huel Cote, MD;  Location: [facility];  Service: Gynecology;  Laterality: N/A;   FOOT SURGERY     TEETH PULLED  03/2016   WISDOM TOOTH EXTRACTION     Patient Active Problem List   Diagnosis Date Noted   Pancreatitis, acute 02/19/2023   Pancreatitis 02/19/2023   Abdominal pain 09/24/2022   Acute pancreatitis 09/24/2022   Neck pain 04/26/2021   Splenic vein thrombosis    Alcohol use disorder, moderate, dependence (HCC)    Alcohol-induced acute pancreatitis    Alcohol withdrawal syndrome without complication (HCC)    Hyponatremia    Pain of upper abdomen 10/28/2020   Tinea manuum 05/21/2019   Abdominal wall lump 02/21/2018   Chronic fatigue syndrome with fibromyalgia 02/24/2017   Skin lesion of left leg 02/10/2017   History of abnormal  cervical Pap smear 02/10/2017   Anxiety 10/15/2014   Depression 10/15/2014   Migraine 10/15/2014   Chronic pain 10/15/2014   Low TSH level 10/15/2014   Tobacco use disorder 10/15/2014    PCP: Ivery Quale, MD  REFERRING PROVIDER: Anson Fret, MD  REFERRING DIAG: [phone] (ICD-10-CM) - Chronic migraine without aura, with intractable migraine, so stated, with status migrainosus M54.2 (ICD-10-CM) - Cervicalgia M54.2,G89.29 (ICD-10-CM) - Chronic neck pain  THERAPY DIAG:  Muscle weakness (generalized)  Chronic pain of both shoulders  Cervicalgia  Other low back pain  Rationale for Evaluation and Treatment: Rehabilitation  ONSET DATE: 12/12/2023 (referral date)  SUBJECTIVE:  SUBJECTIVE STATEMENT: Pt reports that she felt euphoric after last visit and that her pain was improved, today patient is having a low-grade migraine and 7/10 pain in her neck and her L shoulder blade area. Pt reports that she was unable to sit still yesterday so that may have flared up her pain, does a lot of work on her phone looking down at phone while seated in bed.          Hand dominance: Right  PERTINENT HISTORY:  PMH: alcohol use disorder, opioid use disorder, bipolar disorder, migraines, GERD, h/o pancreatitis, ADHD  PAIN:  Are you having pain? Yes: NPRS scale: 7/10 Pain location: back of neck down into L shoulder, upper traps Pain description: "deep, gnawing pain", fire, tension Aggravating factors: up and moving for 1.5-2 hours Relieving factors: heating pad  PRECAUTIONS: None  RED FLAGS: Cervical red flags: None      WEIGHT BEARING RESTRICTIONS: No  FALLS:  Has patient fallen in last 6 months? No  LIVING ENVIRONMENT: Lives with: lives with their family (moved back in with her  mom)  OCCUPATION:  on disability  PLOF: Independent with gait and Independent with transfers  PATIENT GOALS: "I want my pain to go away", "be able to be up more 1.5 hours without having pain"  NEXT MD VISIT: Dr. Lucia Gaskins June 2025  OBJECTIVE:  Note: Objective measures were completed at Evaluation unless otherwise noted.  DIAGNOSTIC FINDINGS:  Most recent imaging in chart: Cervical Spine MRI 07/04/2021 IMPRESSION: Cervical spondylosis, as outlined and with findings most notably as follows.   At C6-C7, there is mild disc degeneration. Disc bulge with bilateral uncovertebral hypertrophy. Superimposed small right center/foraminal disc extrusion with slight cranial migration. The disc extrusion effaces the right ventral thecal sac, narrowing the right foraminal entry zone, with potential to affect the exiting right C7 nerve root. Lateral to this, there is moderate right neural foraminal narrowing. Moderate left neural foraminal narrowing.   At C5-C6, there is a shallow disc bulge. Superimposed tiny right center disc protrusion. Uncovertebral hypertrophy (greater on the right). No significant spinal canal stenosis. Mild relative right neural foraminal narrowing.   At C4-C5, there is a 5 x 3 mm central disc protrusion. The disc protrusion mildly narrows the spinal canal, contacting and minimally flattening the ventral spinal cord.   Straightening of the expected cervical lordosis.  PATIENT SURVEYS:  NDI 29/50 HDI: 100/100, 100% impairment  COGNITION: Overall cognitive status: Within functional limits for tasks assessed  SENSATION: Occasional shooting pain down L arm, N/T occasionally, middle two fingers turn white  POSTURE: rounded shoulders and forward head  PALPATION: Tenderness in suboccipitals and R upper trap > L upper trap Shoulder blades widely spaced and TTP around shoulder blades  CERVICAL ROM:   Active ROM AROM (deg) eval  Flexion 35*  Extension 35*  Right  lateral flexion 25*  Left lateral flexion 25* (Pain in R UT)  Right rotation 60*  Left rotation 50*   (Blank rows = not tested)  UPPER EXTREMITY ROM:  Active ROM Right eval Left eval  Shoulder flexion [facility]  Shoulder extension    Shoulder abduction [facility]  Shoulder adduction    Shoulder extension    Shoulder internal rotation    Shoulder external rotation    Elbow flexion    Elbow extension    Wrist flexion    Wrist extension    Wrist ulnar deviation    Wrist radial deviation    Wrist pronation  Wrist supination     (Blank rows = not tested)  UPPER EXTREMITY MMT:  MMT Right eval Left eval  Shoulder flexion 5 5  Shoulder extension    Shoulder abduction 5 5  Shoulder adduction    Shoulder extension    Shoulder internal rotation    Shoulder external rotation    Middle trapezius    Lower trapezius    Elbow flexion 5 5  Elbow extension 5 5  Wrist flexion    Wrist extension    Wrist ulnar deviation    Wrist radial deviation    Wrist pronation    Wrist supination    Grip strength Slightly decreased Slightly decreased   (Blank rows = not tested)  CERVICAL SPECIAL TESTS:  Upper limb tension test (ULTT): Positive Median nerve: + Ulnar nerve: - Radial nerve: -   TREATMENT:  TherAct Trigger Point Dry Needling  Subsequent Treatment: Instructions provided previously at initial dry needling treatment.   Patient Verbal Consent Given: Yes Education Handout Provided: Yes Muscles Treated: L and R suboccipitals, L rhomboids and upper trap, L lats Electrical Stimulation Performed: No Treatment Response/Outcome: deep ache/pressure; muscle twitch detected   Trial of chirp wheel, provided information for where to purchase if patient interested. Also discussed use of tennis balls for trigger point release.                                                                                                                        TherEx Standing wall B lat stretch 3  x 30 sec each Cues to "tuck in butt" to reduce lumbar lordosis Supine cervical retraction x 10 reps Minimal muscle activation felt in this position Standing cervical retraction with small ball behind head against wall x 10 reps More muscle activation noted, difficulty performing exercise correctly and isolating cervical muscles   PATIENT EDUCATION:  Education details: TPDN, continue HEP Person educated: Patient Education method: Explanation and Demonstration Education comprehension: verbalized understanding, returned demonstration, and needs further education  HOME EXERCISE PROGRAM: Access Code: [email or web address] URL: [email or web address] Date: 12/19/2023 Prepared by: Peter Congo  Exercises - Scapular Retraction with Resistance  - 1 x daily - 7 x weekly - 3 sets - 10 reps - Standing Shoulder Flexion Full Range  - 1 x daily - 7 x weekly - 3 sets - 10 reps - Standing Shoulder Scaption  - 1 x daily - 7 x weekly - 3 sets - 10 reps - Child's Pose with Thread the Needle  - 1 x daily - 7 x weekly - 1 sets - 3-5 reps - 30 sec hold  ASSESSMENT:  CLINICAL IMPRESSION: Emphasis of skilled PT session on performing DN to address ongoing pain and muscle tightness in cervical and upper-mid shoulder region. Pt with good relief of symptoms with DN, does continue to exhibit ongoing trigger points in her L lat area. Followed DN with stretching of lats as well as strengthening of cervical muscles. Pt with difficulty isolating her cervical muscles for  strengthening without compensating with her whole body, can benefit from further practice of this. Pt continues to benefit from skilled PT services to work towards LTGs. Continue POC.    OBJECTIVE IMPAIRMENTS: decreased activity tolerance, decreased ROM, decreased strength, impaired perceived functional ability, improper body mechanics, postural dysfunction, and pain.   ACTIVITY LIMITATIONS: carrying and lifting  PARTICIPATION LIMITATIONS:   unable to work due to chronic pain and migraines  PERSONAL FACTORS: Time since onset of injury/illness/exacerbation and 3+ comorbidities:    alcohol use disorder, opioid use disorder, bipolar disorder, migraines, GERD, h/o pancreatitis, ADHDare also affecting patient's functional outcome.   REHAB POTENTIAL: Good  CLINICAL DECISION MAKING: Stable/uncomplicated  EVALUATION COMPLEXITY: Low   GOALS: Goals reviewed with patient? Yes  SHORT TERM GOALS: Target date: 01/05/2024  Pt will be independent with initial HEP for improved cervical ROM, improved posture and management of pain symptoms in order to build upon functional gains made in therapy. Baseline:  Goal status: INITIAL  LONG TERM GOALS: Target date: 01/26/2024   Pt will be independent with final HEP for improved cervical ROM, improved posture and management of pain symptoms in order to build upon functional gains made in therapy. Baseline:  Goal status: INITIAL  2.  Pt will improve her score on the NDI to 24/50 to demonstrate improved function and decreased disability level. Baseline: 29/50 (3/21) Goal status: INITIAL  3.  Pt will improve her score on the HDI to 80/100 to demonstrate improved function and decreased disability level. Baseline: 100/100 (3/21) Goal status: INITIAL  4.  Pt will improve her cervical ROM in limited motions by >/= 10 degrees for improved function. Baseline:  Active ROM AROM (deg) eval  Flexion 35*  Extension 35*  Right lateral flexion 25*  Left lateral flexion 25* (Pain in R UT)  Right rotation 60*  Left rotation 50*   Goal status: INITIAL     PLAN:  PT FREQUENCY: 2x/week  PT DURATION: 6 weeks  PLANNED INTERVENTIONS: 97164- PT Re-evaluation, 97110-Therapeutic exercises, 97530- Therapeutic activity, [id number]- Neuromuscular re-education, 97535- Self Care, 96045- Manual therapy, [id number]- Electrical stimulation (manual), Patient/Family education, Taping, Dry Needling, Joint mobilization,  Spinal mobilization, Cryotherapy, and Moist heat  PLAN FOR NEXT SESSION: how is HEP? TPDN around scapula and cervical region, lats , UT, median nerve glide, rhomboid strengthening, postural stabilization; suboccipital release and distraction/traction?, kinesiotape for postural reminders, decrease arch in back, cervical retract (with BP cuff?)   Peter Congo, PT Peter Congo, PT, DPT, CSRS  For all possible CPT codes, reference the Planned Interventions line above.     Check all conditions that are expected to impact treatment: {Conditions expected to impact treatment:Psychological or psychiatric disorders   If treatment provided at initial evaluation, no treatment charged due to lack of authorization.      12/27/2023, 8:40 AM

## 2023-12-29 ENCOUNTER — Ambulatory Visit: Admitting: Physical Therapy

## 2023-12-29 DIAGNOSIS — M25511 Pain in right shoulder: Secondary | ICD-10-CM | POA: Diagnosis not present

## 2023-12-29 DIAGNOSIS — M542 Cervicalgia: Secondary | ICD-10-CM

## 2023-12-29 DIAGNOSIS — M6281 Muscle weakness (generalized): Secondary | ICD-10-CM | POA: Diagnosis not present

## 2023-12-29 DIAGNOSIS — M5459 Other low back pain: Secondary | ICD-10-CM | POA: Diagnosis not present

## 2023-12-29 DIAGNOSIS — G8929 Other chronic pain: Secondary | ICD-10-CM | POA: Diagnosis not present

## 2023-12-29 DIAGNOSIS — M25512 Pain in left shoulder: Secondary | ICD-10-CM | POA: Diagnosis not present

## 2023-12-29 NOTE — Therapy (Signed)
 OUTPATIENT PHYSICAL THERAPY CERVICAL TREATMENT   Patient Name: Gail Blackburn MRN: [id number] DOB:08/22/1976, 48 y.o., female Today's Date: 12/29/2023  END OF SESSION:  PT End of Session - 12/29/23 0801     Visit Number 5    Number of Visits 13   with eval   Date for PT Re-Evaluation 02/09/24    Authorization Type Medicaid [employer]    PT Start Time 0800    PT Stop Time 0839    PT Time Calculation (min) 39 min    Activity Tolerance Patient tolerated treatment well    Behavior During Therapy WFL for tasks assessed/performed                 Past Medical History:  Diagnosis Date   Anemia    PRIOR HISTORY   Anxiety    Asthma    Bipolar disorder (HCC)    Borderline   Depression    Fibromyalgia    Genital herpes simplex    Headache    MIGRAINES   Hepatic lesion 2022   interminate   Hypothyroidism 2009   TOOK MEDS FOR FEW WEEKS NO MEDS NOW   Migraine    Pancreatitis    Splenic vein thrombosis    Thyroid disease    treated in past not sure if too high or too low   Past Surgical History:  Procedure Laterality Date   COLONOSCOPY     DILATION AND EVACUATION N/A 05/06/2016   Procedure: DILATATION AND EVACUATION;  Surgeon: Huel Cote, MD;  Location: [facility];  Service: Gynecology;  Laterality: N/A;   FOOT SURGERY     TEETH PULLED  03/2016   WISDOM TOOTH EXTRACTION     Patient Active Problem List   Diagnosis Date Noted   Pancreatitis, acute 02/19/2023   Pancreatitis 02/19/2023   Abdominal pain 09/24/2022   Acute pancreatitis 09/24/2022   Neck pain 04/26/2021   Splenic vein thrombosis    Alcohol use disorder, moderate, dependence (HCC)    Alcohol-induced acute pancreatitis    Alcohol withdrawal syndrome without complication (HCC)    Hyponatremia    Pain of upper abdomen 10/28/2020   Tinea manuum 05/21/2019   Abdominal wall lump 02/21/2018   Chronic fatigue syndrome with fibromyalgia 02/24/2017   Skin lesion of left leg 02/10/2017   History of  abnormal cervical Pap smear 02/10/2017   Anxiety 10/15/2014   Depression 10/15/2014   Migraine 10/15/2014   Chronic pain 10/15/2014   Low TSH level 10/15/2014   Tobacco use disorder 10/15/2014    PCP: Ivery Quale, MD  REFERRING PROVIDER: Anson Fret, MD  REFERRING DIAG: [phone] (ICD-10-CM) - Chronic migraine without aura, with intractable migraine, so stated, with status migrainosus M54.2 (ICD-10-CM) - Cervicalgia M54.2,G89.29 (ICD-10-CM) - Chronic neck pain  THERAPY DIAG:  Muscle weakness (generalized)  Chronic pain of both shoulders  Cervicalgia  Other low back pain  Rationale for Evaluation and Treatment: Rehabilitation  ONSET DATE: 12/12/2023 (referral date)  SUBJECTIVE:  SUBJECTIVE STATEMENT: Pt reports that her pain is about the same, feeling tense. Pt felt less pressure around her L shoulder blade after needling last time. Pt has not had a migraine since last visit except she does have a small one this morning. Pt reports she woke up hurting today. Pain 5/10 today, "annoying".    Hand dominance: Right  PERTINENT HISTORY:  PMH: alcohol use disorder, opioid use disorder, bipolar disorder, migraines, GERD, h/o pancreatitis, ADHD  PAIN:  Are you having pain? Yes: NPRS scale: 5/10 Pain location: back of neck down into L shoulder, upper traps Pain description: "deep, gnawing pain", fire, tension Aggravating factors: up and moving for 1.5-2 hours Relieving factors: heating pad  PRECAUTIONS: None  RED FLAGS: Cervical red flags: None      WEIGHT BEARING RESTRICTIONS: No  FALLS:  Has patient fallen in last 6 months? No  LIVING ENVIRONMENT: Lives with: lives with their family (moved back in with her mom)  OCCUPATION:  on disability  PLOF: Independent with gait  and Independent with transfers  PATIENT GOALS: "I want my pain to go away", "be able to be up more 1.5 hours without having pain"  NEXT MD VISIT: Dr. Lucia Gaskins June 2025  OBJECTIVE:  Note: Objective measures were completed at Evaluation unless otherwise noted.  DIAGNOSTIC FINDINGS:  Most recent imaging in chart: Cervical Spine MRI 07/04/2021 IMPRESSION: Cervical spondylosis, as outlined and with findings most notably as follows.   At C6-C7, there is mild disc degeneration. Disc bulge with bilateral uncovertebral hypertrophy. Superimposed small right center/foraminal disc extrusion with slight cranial migration. The disc extrusion effaces the right ventral thecal sac, narrowing the right foraminal entry zone, with potential to affect the exiting right C7 nerve root. Lateral to this, there is moderate right neural foraminal narrowing. Moderate left neural foraminal narrowing.   At C5-C6, there is a shallow disc bulge. Superimposed tiny right center disc protrusion. Uncovertebral hypertrophy (greater on the right). No significant spinal canal stenosis. Mild relative right neural foraminal narrowing.   At C4-C5, there is a 5 x 3 mm central disc protrusion. The disc protrusion mildly narrows the spinal canal, contacting and minimally flattening the ventral spinal cord.   Straightening of the expected cervical lordosis.  PATIENT SURVEYS:  NDI 29/50 HDI: 100/100, 100% impairment  COGNITION: Overall cognitive status: Within functional limits for tasks assessed  SENSATION: Occasional shooting pain down L arm, N/T occasionally, middle two fingers turn white  POSTURE: rounded shoulders and forward head  PALPATION: Tenderness in suboccipitals and R upper trap > L upper trap Shoulder blades widely spaced and TTP around shoulder blades  CERVICAL ROM:   Active ROM AROM (deg) eval  Flexion 35*  Extension 35*  Right lateral flexion 25*  Left lateral flexion 25* (Pain in R UT)   Right rotation 60*  Left rotation 50*   (Blank rows = not tested)  UPPER EXTREMITY ROM:  Active ROM Right eval Left eval  Shoulder flexion [facility]  Shoulder extension    Shoulder abduction [facility]  Shoulder adduction    Shoulder extension    Shoulder internal rotation    Shoulder external rotation    Elbow flexion    Elbow extension    Wrist flexion    Wrist extension    Wrist ulnar deviation    Wrist radial deviation    Wrist pronation    Wrist supination     (Blank rows = not tested)  UPPER EXTREMITY MMT:  MMT Right eval Left  eval  Shoulder flexion 5 5  Shoulder extension    Shoulder abduction 5 5  Shoulder adduction    Shoulder extension    Shoulder internal rotation    Shoulder external rotation    Middle trapezius    Lower trapezius    Elbow flexion 5 5  Elbow extension 5 5  Wrist flexion    Wrist extension    Wrist ulnar deviation    Wrist radial deviation    Wrist pronation    Wrist supination    Grip strength Slightly decreased Slightly decreased   (Blank rows = not tested)  CERVICAL SPECIAL TESTS:  Upper limb tension test (ULTT): Positive Median nerve: + Ulnar nerve: - Radial nerve: -   TREATMENT:  TherAct Trigger Point Dry Needling  Subsequent Treatment: Instructions provided previously at initial dry needling treatment.   Patient Verbal Consent Given: Yes Education Handout Provided: Yes Muscles Treated: L and R suboccipitals, L and R paraspinals, L UT Electrical Stimulation Performed: No Treatment Response/Outcome: deep ache/pressure; muscle twitch detected   Applied kinesiotape across back from middle of upper trap across mid-back to bottom of opposite shoulder blade on both sides as a tactile reminder of posture and to keep shoulders pulled back and down. Educated patient on how kinesiotape was applied, purpose of tape, wear schedule, and skin inspection following removal of tape.                                                                                                                         TherEx To address weakness in posterior cervical muscles: Supine cervical retraction against BP cuff inflated to 20 mm Hg, pt able to perform gentle cervical retraction to increase pressure to 30 mm Hg x 10 reps Transitioned to supine cervical retraction against towel roll x 10 reps with 5 sec hold Added to HEP, see bolded below   PATIENT EDUCATION:  Education details: TPDN, continue HEP, added to HEP, kinesiotape (see above) Person educated: Patient Education method: Explanation, Demonstration, and Handouts Education comprehension: verbalized understanding, returned demonstration, and needs further education  HOME EXERCISE PROGRAM: Access Code: [email or web address] URL: [email or web address] Date: 12/19/2023 Prepared by: Peter Congo  Exercises - Scapular Retraction with Resistance  - 1 x daily - 7 x weekly - 3 sets - 10 reps - Standing Shoulder Flexion Full Range  - 1 x daily - 7 x weekly - 3 sets - 10 reps - Standing Shoulder Scaption  - 1 x daily - 7 x weekly - 3 sets - 10 reps - Child's Pose with Thread the Needle  - 1 x daily - 7 x weekly - 1 sets - 3-5 reps - 30 sec hold - Supine Chin Tuck with Towel  - 1 x daily - 7 x weekly - 3 sets - 10 reps  ASSESSMENT:  CLINICAL IMPRESSION: Emphasis of skilled PT session on performing DN to address ongoing pain and muscle tightness in cervical and upper-mid shoulder region. Pt with good relief of  symptoms with DN this date. Also applied kinesiotape to mid-back and shoulder blade region as a tactile reminder for posture. Trial of cervical retraction against BP cuff so that patient can have a better understanding of how to correctly activate cervical muscles and perform motion, significant improvement with this as compared to previous session. Pt also exhibits good carryover of cervical retraction to towel roll exercise, added to HEP. Pt continues to benefit from skilled  PT services to work towards LTGs. Continue POC.    OBJECTIVE IMPAIRMENTS: decreased activity tolerance, decreased ROM, decreased strength, impaired perceived functional ability, improper body mechanics, postural dysfunction, and pain.   ACTIVITY LIMITATIONS: carrying and lifting  PARTICIPATION LIMITATIONS:  unable to work due to chronic pain and migraines  PERSONAL FACTORS: Time since onset of injury/illness/exacerbation and 3+ comorbidities:    alcohol use disorder, opioid use disorder, bipolar disorder, migraines, GERD, h/o pancreatitis, ADHDare also affecting patient's functional outcome.   REHAB POTENTIAL: Good  CLINICAL DECISION MAKING: Stable/uncomplicated  EVALUATION COMPLEXITY: Low   GOALS: Goals reviewed with patient? Yes  SHORT TERM GOALS: Target date: 01/05/2024  Pt will be independent with initial HEP for improved cervical ROM, improved posture and management of pain symptoms in order to build upon functional gains made in therapy. Baseline:  Goal status: INITIAL  LONG TERM GOALS: Target date: 01/26/2024   Pt will be independent with final HEP for improved cervical ROM, improved posture and management of pain symptoms in order to build upon functional gains made in therapy. Baseline:  Goal status: INITIAL  2.  Pt will improve her score on the NDI to 24/50 to demonstrate improved function and decreased disability level. Baseline: 29/50 (3/21) Goal status: INITIAL  3.  Pt will improve her score on the HDI to 80/100 to demonstrate improved function and decreased disability level. Baseline: 100/100 (3/21) Goal status: INITIAL  4.  Pt will improve her cervical ROM in limited motions by >/= 10 degrees for improved function. Baseline:  Active ROM AROM (deg) eval  Flexion 35*  Extension 35*  Right lateral flexion 25*  Left lateral flexion 25* (Pain in R UT)  Right rotation 60*  Left rotation 50*   Goal status: INITIAL     PLAN:  PT FREQUENCY:  2x/week  PT DURATION: 6 weeks  PLANNED INTERVENTIONS: 97164- PT Re-evaluation, 97110-Therapeutic exercises, 97530- Therapeutic activity, 97112- Neuromuscular re-education, 97535- Self Care, 96045- Manual therapy, [id number]- Electrical stimulation (manual), Patient/Family education, Taping, Dry Needling, Joint mobilization, Spinal mobilization, Cryotherapy, and Moist heat  PLAN FOR NEXT SESSION: response to ktape? how is HEP? TPDN around scapula and cervical region, lats , UT, median nerve glide, rhomboid strengthening, postural stabilization; suboccipital release and distraction/traction?, kinesiotape for postural reminders, decrease arch in back, lat pulldowns   Peter Congo, PT Peter Congo, PT, DPT, CSRS  For all possible CPT codes, reference the Planned Interventions line above.     Check all conditions that are expected to impact treatment: {Conditions expected to impact treatment:Psychological or psychiatric disorders   If treatment provided at initial evaluation, no treatment charged due to lack of authorization.      12/29/2023, 8:39 AM

## 2024-01-01 ENCOUNTER — Ambulatory Visit: Admitting: Physical Therapy

## 2024-01-01 DIAGNOSIS — M542 Cervicalgia: Secondary | ICD-10-CM | POA: Diagnosis not present

## 2024-01-01 DIAGNOSIS — G8929 Other chronic pain: Secondary | ICD-10-CM

## 2024-01-01 DIAGNOSIS — M25511 Pain in right shoulder: Secondary | ICD-10-CM | POA: Diagnosis not present

## 2024-01-01 DIAGNOSIS — M6281 Muscle weakness (generalized): Secondary | ICD-10-CM

## 2024-01-01 DIAGNOSIS — M5459 Other low back pain: Secondary | ICD-10-CM

## 2024-01-01 DIAGNOSIS — M25512 Pain in left shoulder: Secondary | ICD-10-CM | POA: Diagnosis not present

## 2024-01-01 NOTE — Therapy (Signed)
 OUTPATIENT PHYSICAL THERAPY CERVICAL TREATMENT   Patient Name: Gail Blackburn MRN: [id number] DOB:[birth year], 48 y.o., female Today's Date: 01/01/2024  END OF SESSION:  PT End of Session - 01/01/24 0800     Visit Number 6    Number of Visits 13   with eval   Date for PT Re-Evaluation 02/09/24    Authorization Type Medicaid [employer]    Authorization - Visit Number 5    Authorization - Number of Visits 5   through 02/16/24   PT Start Time 0800    PT Stop Time 0838    PT Time Calculation (min) 38 min    Activity Tolerance Patient tolerated treatment well    Behavior During Therapy WFL for tasks assessed/performed                  Past Medical History:  Diagnosis Date   Anemia    PRIOR HISTORY   Anxiety    Asthma    Bipolar disorder (HCC)    Borderline   Depression    Fibromyalgia    Genital herpes simplex    Headache    MIGRAINES   Hepatic lesion 2022   interminate   Hypothyroidism 2009   TOOK MEDS FOR FEW WEEKS NO MEDS NOW   Migraine    Pancreatitis    Splenic vein thrombosis    Thyroid disease    treated in past not sure if too high or too low   Past Surgical History:  Procedure Laterality Date   COLONOSCOPY     DILATION AND EVACUATION N/A 05/06/2016   Procedure: DILATATION AND EVACUATION;  Surgeon: Huel Cote, MD;  Location: [facility];  Service: Gynecology;  Laterality: N/A;   FOOT SURGERY     TEETH PULLED  03/2016   WISDOM TOOTH EXTRACTION     Patient Active Problem List   Diagnosis Date Noted   Pancreatitis, acute 02/19/2023   Pancreatitis 02/19/2023   Abdominal pain 09/24/2022   Acute pancreatitis 09/24/2022   Neck pain 04/26/2021   Splenic vein thrombosis    Alcohol use disorder, moderate, dependence (HCC)    Alcohol-induced acute pancreatitis    Alcohol withdrawal syndrome without complication (HCC)    Hyponatremia    Pain of upper abdomen 10/28/2020   Tinea manuum 05/21/2019   Abdominal wall lump 02/21/2018   Chronic fatigue  syndrome with fibromyalgia 02/24/2017   Skin lesion of left leg 02/10/2017   History of abnormal cervical Pap smear 02/10/2017   Anxiety 10/15/2014   Depression 10/15/2014   Migraine 10/15/2014   Chronic pain 10/15/2014   Low TSH level 10/15/2014   Tobacco use disorder 10/15/2014    PCP: Ivery Quale, MD  REFERRING PROVIDER: Anson Fret, MD  REFERRING DIAG: [phone] (ICD-10-CM) - Chronic migraine without aura, with intractable migraine, so stated, with status migrainosus M54.2 (ICD-10-CM) - Cervicalgia M54.2,G89.29 (ICD-10-CM) - Chronic neck pain  THERAPY DIAG:  Muscle weakness (generalized)  Chronic pain of both shoulders  Cervicalgia  Other low back pain  Rationale for Evaluation and Treatment: Rehabilitation  ONSET DATE: 12/12/2023 (referral date)  SUBJECTIVE:  SUBJECTIVE STATEMENT: Pt has been working on her stretches, even if just for 10-20 minutes. Pt reports that she did blow leaves over the weekend (backpack blower) and now has R UE pain from that.  Kinesiotape was more annoying than anything, removed it after a day.     Hand dominance: Right  PERTINENT HISTORY:  PMH: alcohol use disorder, opioid use disorder, bipolar disorder, migraines, GERD, h/o pancreatitis, ADHD  PAIN:  Are you having pain? Yes: NPRS scale: 7/10 Pain location: back of neck down into L shoulder, upper traps Pain description: "deep, gnawing pain", fire, tension Aggravating factors: up and moving for 1.5-2 hours Relieving factors: heating pad  PRECAUTIONS: None  RED FLAGS: Cervical red flags: None      WEIGHT BEARING RESTRICTIONS: No  FALLS:  Has patient fallen in last 6 months? No  LIVING ENVIRONMENT: Lives with: lives with their family (moved back in with her mom)  OCCUPATION:   on disability  PLOF: Independent with gait and Independent with transfers  PATIENT GOALS: "I want my pain to go away", "be able to be up more 1.5 hours without having pain"  NEXT MD VISIT: Dr. Lucia Gaskins June 2025  OBJECTIVE:  Note: Objective measures were completed at Evaluation unless otherwise noted.  DIAGNOSTIC FINDINGS:  Most recent imaging in chart: Cervical Spine MRI 07/04/2021 IMPRESSION: Cervical spondylosis, as outlined and with findings most notably as follows.   At C6-C7, there is mild disc degeneration. Disc bulge with bilateral uncovertebral hypertrophy. Superimposed small right center/foraminal disc extrusion with slight cranial migration. The disc extrusion effaces the right ventral thecal sac, narrowing the right foraminal entry zone, with potential to affect the exiting right C7 nerve root. Lateral to this, there is moderate right neural foraminal narrowing. Moderate left neural foraminal narrowing.   At C5-C6, there is a shallow disc bulge. Superimposed tiny right center disc protrusion. Uncovertebral hypertrophy (greater on the right). No significant spinal canal stenosis. Mild relative right neural foraminal narrowing.   At C4-C5, there is a 5 x 3 mm central disc protrusion. The disc protrusion mildly narrows the spinal canal, contacting and minimally flattening the ventral spinal cord.   Straightening of the expected cervical lordosis.  PATIENT SURVEYS:  NDI 29/50 HDI: 100/100, 100% impairment  COGNITION: Overall cognitive status: Within functional limits for tasks assessed  SENSATION: Occasional shooting pain down L arm, N/T occasionally, middle two fingers turn white  POSTURE: rounded shoulders and forward head  PALPATION: Tenderness in suboccipitals and R upper trap > L upper trap Shoulder blades widely spaced and TTP around shoulder blades  CERVICAL ROM:   Active ROM AROM (deg) eval  Flexion 35*  Extension 35*  Right lateral flexion 25*   Left lateral flexion 25* (Pain in R UT)  Right rotation 60*  Left rotation 50*   (Blank rows = not tested)  UPPER EXTREMITY ROM:  Active ROM Right eval Left eval  Shoulder flexion [facility]  Shoulder extension    Shoulder abduction [facility]  Shoulder adduction    Shoulder extension    Shoulder internal rotation    Shoulder external rotation    Elbow flexion    Elbow extension    Wrist flexion    Wrist extension    Wrist ulnar deviation    Wrist radial deviation    Wrist pronation    Wrist supination     (Blank rows = not tested)  UPPER EXTREMITY MMT:  MMT Right eval Left eval  Shoulder flexion 5 5  Shoulder extension    Shoulder abduction 5 5  Shoulder adduction    Shoulder extension    Shoulder internal rotation    Shoulder external rotation    Middle trapezius    Lower trapezius    Elbow flexion 5 5  Elbow extension 5 5  Wrist flexion    Wrist extension    Wrist ulnar deviation    Wrist radial deviation    Wrist pronation    Wrist supination    Grip strength Slightly decreased Slightly decreased   (Blank rows = not tested)  CERVICAL SPECIAL TESTS:  Upper limb tension test (ULTT): Positive Median nerve: + Ulnar nerve: - Radial nerve: -   TREATMENT:  TherAct Trigger Point Dry Needling  Subsequent Treatment: Instructions provided previously at initial dry needling treatment.   Patient Verbal Consent Given: Yes Education Handout Provided: Yes Muscles Treated: L and R suboccipitals, L UT, L rhomboids, L teres major/minor and L lats Electrical Stimulation Performed: No Treatment Response/Outcome: deep ache/pressure; muscle twitch detected   For re-assessment: NDI: 23/50 HDI: 72/100  CERVICAL ROM:   Active ROM AROM (deg) eval AROM (deg) 01/01/24  Flexion 35* 45*  Extension 35* 42*  Right lateral flexion 25* 25*  Left lateral flexion 25* (Pain in R UT) 30*  Right rotation 60* 60*  Left rotation 50* 53*   (Blank rows = not  tested)     PATIENT EDUCATION:  Education details: TPDN, continue HEP, results of reassessments this date and improvements made Person educated: Patient Education method: Explanation and Demonstration Education comprehension: verbalized understanding, returned demonstration, and needs further education  HOME EXERCISE PROGRAM: Access Code: [email or web address] URL: [email or web address] Date: 12/19/2023 Prepared by: Peter Congo  Exercises - Scapular Retraction with Resistance  - 1 x daily - 7 x weekly - 3 sets - 10 reps - Standing Shoulder Flexion Full Range  - 1 x daily - 7 x weekly - 3 sets - 10 reps - Standing Shoulder Scaption  - 1 x daily - 7 x weekly - 3 sets - 10 reps - Child's Pose with Thread the Needle  - 1 x daily - 7 x weekly - 1 sets - 3-5 reps - 30 sec hold - Supine Chin Tuck with Towel  - 1 x daily - 7 x weekly - 3 sets - 10 reps  ASSESSMENT:  CLINICAL IMPRESSION: Emphasis of skilled PT session on performing DN to address ongoing pain and muscle tightness in cervical and upper-mid shoulder region. Pt with good relief of symptoms with DN this date. Also reassessed NDI, HDI, and cervical AROM. Pt exhibits decreased disability based on her improved score on the NDI from 29/50 on initial eval (3/21) to 23/50 this date, improved score on the HDI from 100/100 on initial eval (3/21) to 72/100 this date, and improved cervical AROM as noted above. Pt has shown a significant improvement in her cervical ROM, a reduction of her pain symptoms, and a decrease in her overall disability level in just 5 PT sessions so far. Pt continues to benefit from skilled PT services to work towards LTGs with improved postural strengthening in order to increase her independence with management of her pain symptoms. Continue POC.    OBJECTIVE IMPAIRMENTS: decreased activity tolerance, decreased ROM, decreased strength, impaired perceived functional ability, improper body mechanics, postural  dysfunction, and pain.   ACTIVITY LIMITATIONS: carrying and lifting  PARTICIPATION LIMITATIONS:  unable to work due to chronic pain and migraines  PERSONAL FACTORS: Time since onset of injury/illness/exacerbation  and 3+ comorbidities:    alcohol use disorder, opioid use disorder, bipolar disorder, migraines, GERD, h/o pancreatitis, ADHDare also affecting patient's functional outcome.   REHAB POTENTIAL: Good  CLINICAL DECISION MAKING: Stable/uncomplicated  EVALUATION COMPLEXITY: Low   GOALS: Goals reviewed with patient? Yes  SHORT TERM GOALS: Target date: 01/05/2024  Pt will be independent with initial HEP for improved cervical ROM, improved posture and management of pain symptoms in order to build upon functional gains made in therapy. Baseline:  Goal status: MET  LONG TERM GOALS: Target date: 01/26/2024   Pt will be independent with final HEP for improved cervical ROM, improved posture and management of pain symptoms in order to build upon functional gains made in therapy. Baseline:  Goal status: INITIAL  2.  Pt will improve her score on the NDI to 18/50 to demonstrate improved function and decreased disability level. Baseline: 29/50 (3/21), 23/50 (4/7) Goal status: REVISED/UPGRADED  3.  Pt will improve her score on the HDI to 52/100 to demonstrate improved function and decreased disability level. Baseline: 100/100 (3/21), 72/100 (4/7) Goal status: REVISED/UPGRADED  4.  Pt will improve her cervical ROM in limited motions by >/= 10 degrees for improved function. Baseline:  Active ROM AROM (deg) eval AROM (deg) 01/01/24  Flexion 35* 45*  Extension 35* 42*  Right lateral flexion 25* 25*  Left lateral flexion 25* (Pain in R UT) 30*  Right rotation 60* 60*  Left rotation 50* 53*   (Blank rows = not tested)  Goal status: IN PROGRESS     PLAN:  PT FREQUENCY: 2x/week  PT DURATION: 6 weeks  PLANNED INTERVENTIONS: 97164- PT Re-evaluation, 97110-Therapeutic exercises,  97530- Therapeutic activity, [id number]- Neuromuscular re-education, 97535- Self Care, 47829- Manual therapy, 56213- Electrical stimulation (manual), Patient/Family education, Taping, Dry Needling, Joint mobilization, Spinal mobilization, Cryotherapy, and Moist heat  PLAN FOR NEXT SESSION: how is HEP? TPDN around scapula and cervical region, lats , UT, median nerve glide, rhomboid strengthening, postural stabilization; suboccipital release and distraction/traction?, kinesiotape for postural reminders, decrease arch in back, lat pulldowns   Peter Congo, PT Peter Congo, PT, DPT, CSRS  For all possible CPT codes, reference the Planned Interventions line above.     Check all conditions that are expected to impact treatment: {Conditions expected to impact treatment:Psychological or psychiatric disorders   If treatment provided at initial evaluation, no treatment charged due to lack of authorization.      01/01/2024, 8:41 AM

## 2024-01-04 ENCOUNTER — Ambulatory Visit: Admitting: Physical Therapy

## 2024-01-04 DIAGNOSIS — G8929 Other chronic pain: Secondary | ICD-10-CM | POA: Diagnosis not present

## 2024-01-04 DIAGNOSIS — M25511 Pain in right shoulder: Secondary | ICD-10-CM | POA: Diagnosis not present

## 2024-01-04 DIAGNOSIS — M542 Cervicalgia: Secondary | ICD-10-CM

## 2024-01-04 DIAGNOSIS — M5459 Other low back pain: Secondary | ICD-10-CM

## 2024-01-04 DIAGNOSIS — M6281 Muscle weakness (generalized): Secondary | ICD-10-CM

## 2024-01-04 DIAGNOSIS — M25512 Pain in left shoulder: Secondary | ICD-10-CM | POA: Diagnosis not present

## 2024-01-04 NOTE — Therapy (Signed)
 OUTPATIENT PHYSICAL THERAPY CERVICAL TREATMENT   Patient Name: Gail Blackburn MRN: [id number] DOB:29-May-1976, 48 y.o., female Today's Date: 01/04/2024  END OF SESSION:  PT End of Session - 01/04/24 0801     Visit Number 7    Number of Visits 13   with eval   Date for PT Re-Evaluation 02/09/24    Authorization Type Medicaid [employer]    Authorization - Visit Number 1    Authorization - Number of Visits 4   through 02/02/24   PT Start Time 0802    PT Stop Time 0844    PT Time Calculation (min) 42 min    Activity Tolerance Patient tolerated treatment well    Behavior During Therapy WFL for tasks assessed/performed                   Past Medical History:  Diagnosis Date   Anemia    PRIOR HISTORY   Anxiety    Asthma    Bipolar disorder (HCC)    Borderline   Depression    Fibromyalgia    Genital herpes simplex    Headache    MIGRAINES   Hepatic lesion 2022   interminate   Hypothyroidism 2009   TOOK MEDS FOR FEW WEEKS NO MEDS NOW   Migraine    Pancreatitis    Splenic vein thrombosis    Thyroid disease    treated in past not sure if too high or too low   Past Surgical History:  Procedure Laterality Date   COLONOSCOPY     DILATION AND EVACUATION N/A 05/06/2016   Procedure: DILATATION AND EVACUATION;  Surgeon: Huel Cote, MD;  Location: [facility];  Service: Gynecology;  Laterality: N/A;   FOOT SURGERY     TEETH PULLED  03/2016   WISDOM TOOTH EXTRACTION     Patient Active Problem List   Diagnosis Date Noted   Pancreatitis, acute 02/19/2023   Pancreatitis 02/19/2023   Abdominal pain 09/24/2022   Acute pancreatitis 09/24/2022   Neck pain 04/26/2021   Splenic vein thrombosis    Alcohol use disorder, moderate, dependence (HCC)    Alcohol-induced acute pancreatitis    Alcohol withdrawal syndrome without complication (HCC)    Hyponatremia    Pain of upper abdomen 10/28/2020   Tinea manuum 05/21/2019   Abdominal wall lump 02/21/2018   Chronic fatigue  syndrome with fibromyalgia 02/24/2017   Skin lesion of left leg 02/10/2017   History of abnormal cervical Pap smear 02/10/2017   Anxiety 10/15/2014   Depression 10/15/2014   Migraine 10/15/2014   Chronic pain 10/15/2014   Low TSH level 10/15/2014   Tobacco use disorder 10/15/2014    PCP: Ivery Quale, MD  REFERRING PROVIDER: Anson Fret, MD  REFERRING DIAG: [phone] (ICD-10-CM) - Chronic migraine without aura, with intractable migraine, so stated, with status migrainosus M54.2 (ICD-10-CM) - Cervicalgia M54.2,G89.29 (ICD-10-CM) - Chronic neck pain  THERAPY DIAG:  Muscle weakness (generalized)  Chronic pain of both shoulders  Cervicalgia  Other low back pain  Rationale for Evaluation and Treatment: Rehabilitation  ONSET DATE: 12/12/2023 (referral date)  SUBJECTIVE:  SUBJECTIVE STATEMENT: Pt reports she is not feeling well today, her neck has been hurting her worse over the past 2 days - no trigger except stress. Pt also just not feeling well otherwise.     Hand dominance: Right  PERTINENT HISTORY:  PMH: alcohol use disorder, opioid use disorder, bipolar disorder, migraines, GERD, h/o pancreatitis, ADHD  PAIN:  Are you having pain? Yes: NPRS scale: 7/10 Pain location: back of neck down into L shoulder, upper traps Pain description: "deep, gnawing pain", fire, tension Aggravating factors: up and moving for 1.5-2 hours Relieving factors: heating pad  PRECAUTIONS: None  RED FLAGS: Cervical red flags: None      WEIGHT BEARING RESTRICTIONS: No  FALLS:  Has patient fallen in last 6 months? No  LIVING ENVIRONMENT: Lives with: lives with their family (moved back in with her mom)  OCCUPATION:  on disability  PLOF: Independent with gait and Independent with  transfers  PATIENT GOALS: "I want my pain to go away", "be able to be up more 1.5 hours without having pain"  NEXT MD VISIT: Dr. Lucia Gaskins June 2025  OBJECTIVE:  Note: Objective measures were completed at Evaluation unless otherwise noted.  DIAGNOSTIC FINDINGS:  Most recent imaging in chart: Cervical Spine MRI 07/04/2021 IMPRESSION: Cervical spondylosis, as outlined and with findings most notably as follows.   At C6-C7, there is mild disc degeneration. Disc bulge with bilateral uncovertebral hypertrophy. Superimposed small right center/foraminal disc extrusion with slight cranial migration. The disc extrusion effaces the right ventral thecal sac, narrowing the right foraminal entry zone, with potential to affect the exiting right C7 nerve root. Lateral to this, there is moderate right neural foraminal narrowing. Moderate left neural foraminal narrowing.   At C5-C6, there is a shallow disc bulge. Superimposed tiny right center disc protrusion. Uncovertebral hypertrophy (greater on the right). No significant spinal canal stenosis. Mild relative right neural foraminal narrowing.   At C4-C5, there is a 5 x 3 mm central disc protrusion. The disc protrusion mildly narrows the spinal canal, contacting and minimally flattening the ventral spinal cord.   Straightening of the expected cervical lordosis.  PATIENT SURVEYS:  NDI 29/50 HDI: 100/100, 100% impairment  COGNITION: Overall cognitive status: Within functional limits for tasks assessed  SENSATION: Occasional shooting pain down L arm, N/T occasionally, middle two fingers turn white  POSTURE: rounded shoulders and forward head  PALPATION: Tenderness in suboccipitals and R upper trap > L upper trap Shoulder blades widely spaced and TTP around shoulder blades  CERVICAL ROM:   Active ROM AROM (deg) eval  Flexion 35*  Extension 35*  Right lateral flexion 25*  Left lateral flexion 25* (Pain in R UT)  Right rotation 60*   Left rotation 50*   (Blank rows = not tested)  UPPER EXTREMITY ROM:  Active ROM Right eval Left eval  Shoulder flexion [facility]  Shoulder extension    Shoulder abduction [facility]  Shoulder adduction    Shoulder extension    Shoulder internal rotation    Shoulder external rotation    Elbow flexion    Elbow extension    Wrist flexion    Wrist extension    Wrist ulnar deviation    Wrist radial deviation    Wrist pronation    Wrist supination     (Blank rows = not tested)  UPPER EXTREMITY MMT:  MMT Right eval Left eval  Shoulder flexion 5 5  Shoulder extension    Shoulder abduction 5 5  Shoulder adduction  Shoulder extension    Shoulder internal rotation    Shoulder external rotation    Middle trapezius    Lower trapezius    Elbow flexion 5 5  Elbow extension 5 5  Wrist flexion    Wrist extension    Wrist ulnar deviation    Wrist radial deviation    Wrist pronation    Wrist supination    Grip strength Slightly decreased Slightly decreased   (Blank rows = not tested)  CERVICAL SPECIAL TESTS:  Upper limb tension test (ULTT): Positive Median nerve: + Ulnar nerve: - Radial nerve: -   TREATMENT:  TherAct Trigger Point Dry Needling  Subsequent Treatment: Instructions provided previously at initial dry needling treatment.   Patient Verbal Consent Given: Yes Education Handout Provided: Yes Muscles Treated: L and R suboccipitals, L and R cervical paraspinals, L teres major/minor and L lats Electrical Stimulation Performed: No Treatment Response/Outcome: deep ache/pressure; muscle twitch detected   To work on relaxation of cervical musculature: Supine on Chirp Wheel XR x 5 min while performing deep breathing and progressive relaxation in a low stimulus environment  PATIENT EDUCATION:  Education details: TPDN, continue HEP Person educated: Patient Education method: Medical illustrator Education comprehension: verbalized understanding,  returned demonstration, and needs further education  HOME EXERCISE PROGRAM: Access Code: [email or web address] URL: [email or web address] Date: 12/19/2023 Prepared by: Peter Congo  Exercises - Scapular Retraction with Resistance  - 1 x daily - 7 x weekly - 3 sets - 10 reps - Standing Shoulder Flexion Full Range  - 1 x daily - 7 x weekly - 3 sets - 10 reps - Standing Shoulder Scaption  - 1 x daily - 7 x weekly - 3 sets - 10 reps - Child's Pose with Thread the Needle  - 1 x daily - 7 x weekly - 1 sets - 3-5 reps - 30 sec hold - Supine Chin Tuck with Towel  - 1 x daily - 7 x weekly - 3 sets - 10 reps  ASSESSMENT:  CLINICAL IMPRESSION: Emphasis of skilled PT session on performing DN to address pain and tightness in cervical and L shoulder area muscles this date. Pt reports that her pain decreased on L side to 6/10 after DN. Also worked on deep breathing techniques and progressive relaxation as stress is a big contributor to patient's ongoing pain symptoms. Pt continues to benefit from skilled PT services to work towards LTGs with improved postural strengthening in order to increase her independence with management of her pain symptoms. Continue POC.    OBJECTIVE IMPAIRMENTS: decreased activity tolerance, decreased ROM, decreased strength, impaired perceived functional ability, improper body mechanics, postural dysfunction, and pain.   ACTIVITY LIMITATIONS: carrying and lifting  PARTICIPATION LIMITATIONS:  unable to work due to chronic pain and migraines  PERSONAL FACTORS: Time since onset of injury/illness/exacerbation and 3+ comorbidities:    alcohol use disorder, opioid use disorder, bipolar disorder, migraines, GERD, h/o pancreatitis, ADHDare also affecting patient's functional outcome.   REHAB POTENTIAL: Good  CLINICAL DECISION MAKING: Stable/uncomplicated  EVALUATION COMPLEXITY: Low   GOALS: Goals reviewed with patient? Yes  SHORT TERM GOALS: Target date: 01/05/2024  Pt  will be independent with initial HEP for improved cervical ROM, improved posture and management of pain symptoms in order to build upon functional gains made in therapy. Baseline:  Goal status: MET  LONG TERM GOALS: Target date: 01/26/2024   Pt will be independent with final HEP for improved cervical ROM, improved posture and management of pain symptoms in order  to build upon functional gains made in therapy. Baseline:  Goal status: INITIAL  2.  Pt will improve her score on the NDI to 18/50 to demonstrate improved function and decreased disability level. Baseline: 29/50 (3/21), 23/50 (4/7) Goal status: REVISED/UPGRADED  3.  Pt will improve her score on the HDI to 52/100 to demonstrate improved function and decreased disability level. Baseline: 100/100 (3/21), 72/100 (4/7) Goal status: REVISED/UPGRADED  4.  Pt will improve her cervical ROM in limited motions by >/= 10 degrees for improved function. Baseline:  Active ROM AROM (deg) eval AROM (deg) 01/01/24  Flexion 35* 45*  Extension 35* 42*  Right lateral flexion 25* 25*  Left lateral flexion 25* (Pain in R UT) 30*  Right rotation 60* 60*  Left rotation 50* 53*   (Blank rows = not tested)  Goal status: IN PROGRESS     PLAN:  PT FREQUENCY: 2x/week  PT DURATION: 6 weeks  PLANNED INTERVENTIONS: 97164- PT Re-evaluation, 97110-Therapeutic exercises, 97530- Therapeutic activity, [id number]- Neuromuscular re-education, 97535- Self Care, 96045- Manual therapy, 40981- Electrical stimulation (manual), Patient/Family education, Taping, Dry Needling, Joint mobilization, Spinal mobilization, Cryotherapy, and Moist heat  PLAN FOR NEXT SESSION: how is HEP? TPDN around scapula and cervical region, lats , UT, median nerve glide, rhomboid strengthening, postural stabilization; suboccipital release and distraction/traction?, kinesiotape for postural reminders, decrease arch in back, lat pulldowns, DN SCM   Peter Congo, PT Peter Congo, PT,  DPT, CSRS  For all possible CPT codes, reference the Planned Interventions line above.     Check all conditions that are expected to impact treatment: {Conditions expected to impact treatment:Psychological or psychiatric disorders   If treatment provided at initial evaluation, no treatment charged due to lack of authorization.      01/04/2024, 8:45 AM

## 2024-01-08 DIAGNOSIS — F331 Major depressive disorder, recurrent, moderate: Secondary | ICD-10-CM | POA: Diagnosis not present

## 2024-01-08 DIAGNOSIS — F411 Generalized anxiety disorder: Secondary | ICD-10-CM | POA: Diagnosis not present

## 2024-01-09 ENCOUNTER — Ambulatory Visit: Admitting: Physical Therapy

## 2024-01-09 ENCOUNTER — Encounter: Admitting: Physician Assistant

## 2024-01-09 DIAGNOSIS — M5459 Other low back pain: Secondary | ICD-10-CM

## 2024-01-09 DIAGNOSIS — M542 Cervicalgia: Secondary | ICD-10-CM

## 2024-01-09 DIAGNOSIS — M6281 Muscle weakness (generalized): Secondary | ICD-10-CM

## 2024-01-09 DIAGNOSIS — G8929 Other chronic pain: Secondary | ICD-10-CM

## 2024-01-09 DIAGNOSIS — M25512 Pain in left shoulder: Secondary | ICD-10-CM | POA: Diagnosis not present

## 2024-01-09 DIAGNOSIS — M25511 Pain in right shoulder: Secondary | ICD-10-CM | POA: Diagnosis not present

## 2024-01-09 NOTE — Therapy (Signed)
 OUTPATIENT PHYSICAL THERAPY CERVICAL TREATMENT   Patient Name: Gail Blackburn MRN: [id number] DOB:11-06-1975, 48 y.o., female Today's Date: 01/09/2024  END OF SESSION:  PT End of Session - 01/09/24 0805     Visit Number 8    Number of Visits 13   with eval   Date for PT Re-Evaluation 02/09/24    Authorization Type Medicaid [employer]    Authorization - Visit Number 2    Authorization - Number of Visits 4   through 02/02/24   PT Start Time 0803   pt arrived late   PT Stop Time 0841    PT Time Calculation (min) 38 min    Activity Tolerance Patient tolerated treatment well    Behavior During Therapy [facility] for tasks assessed/performed                    Past Medical History:  Diagnosis Date   Anemia    PRIOR HISTORY   Anxiety    Asthma    Bipolar disorder (HCC)    Borderline   Depression    Fibromyalgia    Genital herpes simplex    Headache    MIGRAINES   Hepatic lesion 2022   interminate   Hypothyroidism 2009   TOOK MEDS FOR FEW WEEKS NO MEDS NOW   Migraine    Pancreatitis    Splenic vein thrombosis    Thyroid disease    treated in past not sure if too high or too low   Past Surgical History:  Procedure Laterality Date   COLONOSCOPY     DILATION AND EVACUATION N/A 05/06/2016   Procedure: DILATATION AND EVACUATION;  Surgeon: Rogene Claude, MD;  Location: [facility];  Service: Gynecology;  Laterality: N/A;   FOOT SURGERY     TEETH PULLED  03/2016   WISDOM TOOTH EXTRACTION     Patient Active Problem List   Diagnosis Date Noted   Pancreatitis, acute 02/19/2023   Pancreatitis 02/19/2023   Abdominal pain 09/24/2022   Acute pancreatitis 09/24/2022   Neck pain 04/26/2021   Splenic vein thrombosis    Alcohol use disorder, moderate, dependence (HCC)    Alcohol-induced acute pancreatitis    Alcohol withdrawal syndrome without complication (HCC)    Hyponatremia    Pain of upper abdomen 10/28/2020   Tinea manuum 05/21/2019   Abdominal wall lump 02/21/2018    Chronic fatigue syndrome with fibromyalgia 02/24/2017   Skin lesion of left leg 02/10/2017   History of abnormal cervical Pap smear 02/10/2017   Anxiety 10/15/2014   Depression 10/15/2014   Migraine 10/15/2014   Chronic pain 10/15/2014   Low TSH level 10/15/2014   Tobacco use disorder 10/15/2014    PCP: Carey Chapman, MD  REFERRING PROVIDER: Glory Larsen, MD  REFERRING DIAG: [phone] (ICD-10-CM) - Chronic migraine without aura, with intractable migraine, so stated, with status migrainosus M54.2 (ICD-10-CM) - Cervicalgia M54.2,G89.29 (ICD-10-CM) - Chronic neck pain  THERAPY DIAG:  Muscle weakness (generalized)  Chronic pain of both shoulders  Cervicalgia  Other low back pain  Rationale for Evaluation and Treatment: Rehabilitation  ONSET DATE: 12/12/2023 (referral date)  SUBJECTIVE:  SUBJECTIVE STATEMENT: Pt having more pain today due to doing lots of yard work and planting over the weekend. Pt reports her pain was worse last night (7-8/10) but is doing better today (5/10). Pt has been working on her stretches and exercises, just not consistently. Pt did take breaks while doing yard work.  Pt reports that her L shoulder blade is tender but is doing better.     Hand dominance: Right  PERTINENT HISTORY:  PMH: alcohol use disorder, opioid use disorder, bipolar disorder, migraines, GERD, h/o pancreatitis, ADHD  PAIN:  Are you having pain? Yes: NPRS scale: 5/10 Pain location: back of neck down into L shoulder, upper traps Pain description: "deep, gnawing pain", fire, tension Aggravating factors: up and moving for 1.5-2 hours Relieving factors: heating pad  PRECAUTIONS: None  RED FLAGS: Cervical red flags: None      WEIGHT BEARING RESTRICTIONS: No  FALLS:  Has patient  fallen in last 6 months? No  LIVING ENVIRONMENT: Lives with: lives with their family (moved back in with her mom)  OCCUPATION:  on disability  PLOF: Independent with gait and Independent with transfers  PATIENT GOALS: "I want my pain to go away", "be able to be up more 1.5 hours without having pain"  NEXT MD VISIT: Dr. Lucia Gaskins June 2025  OBJECTIVE:  Note: Objective measures were completed at Evaluation unless otherwise noted.  DIAGNOSTIC FINDINGS:  Most recent imaging in chart: Cervical Spine MRI 07/04/2021 IMPRESSION: Cervical spondylosis, as outlined and with findings most notably as follows.   At C6-C7, there is mild disc degeneration. Disc bulge with bilateral uncovertebral hypertrophy. Superimposed small right center/foraminal disc extrusion with slight cranial migration. The disc extrusion effaces the right ventral thecal sac, narrowing the right foraminal entry zone, with potential to affect the exiting right C7 nerve root. Lateral to this, there is moderate right neural foraminal narrowing. Moderate left neural foraminal narrowing.   At C5-C6, there is a shallow disc bulge. Superimposed tiny right center disc protrusion. Uncovertebral hypertrophy (greater on the right). No significant spinal canal stenosis. Mild relative right neural foraminal narrowing.   At C4-C5, there is a 5 x 3 mm central disc protrusion. The disc protrusion mildly narrows the spinal canal, contacting and minimally flattening the ventral spinal cord.   Straightening of the expected cervical lordosis.  PATIENT SURVEYS:  NDI 29/50 HDI: 100/100, 100% impairment  COGNITION: Overall cognitive status: Within functional limits for tasks assessed  SENSATION: Occasional shooting pain down L arm, N/T occasionally, middle two fingers turn white  POSTURE: rounded shoulders and forward head  PALPATION: Tenderness in suboccipitals and R upper trap > L upper trap Shoulder blades widely spaced and  TTP around shoulder blades  CERVICAL ROM:   Active ROM AROM (deg) eval  Flexion 35*  Extension 35*  Right lateral flexion 25*  Left lateral flexion 25* (Pain in R UT)  Right rotation 60*  Left rotation 50*   (Blank rows = not tested)  UPPER EXTREMITY ROM:  Active ROM Right eval Left eval  Shoulder flexion [facility]  Shoulder extension    Shoulder abduction [facility]  Shoulder adduction    Shoulder extension    Shoulder internal rotation    Shoulder external rotation    Elbow flexion    Elbow extension    Wrist flexion    Wrist extension    Wrist ulnar deviation    Wrist radial deviation    Wrist pronation    Wrist supination     (  Blank rows = not tested)  UPPER EXTREMITY MMT:  MMT Right eval Left eval  Shoulder flexion 5 5  Shoulder extension    Shoulder abduction 5 5  Shoulder adduction    Shoulder extension    Shoulder internal rotation    Shoulder external rotation    Middle trapezius    Lower trapezius    Elbow flexion 5 5  Elbow extension 5 5  Wrist flexion    Wrist extension    Wrist ulnar deviation    Wrist radial deviation    Wrist pronation    Wrist supination    Grip strength Slightly decreased Slightly decreased   (Blank rows = not tested)  CERVICAL SPECIAL TESTS:  Upper limb tension test (ULTT): Positive Median nerve: + Ulnar nerve: - Radial nerve: -   TREATMENT:  TherAct Trigger Point Dry Needling  Subsequent Treatment: Instructions provided previously at initial dry needling treatment.   Patient Verbal Consent Given: Yes Education Handout Provided: Yes Muscles Treated: L and R suboccipitals, L and R cervical paraspinals, L and R SCM Electrical Stimulation Performed: No Treatment Response/Outcome: deep ache/pressure; muscle twitch detected   TherEx To address ongoing tightness in SCM as well as weakness in cervical muscles: Seated B SCM stretch 3 x 30 sec each B Prone cervical extension from flexed position to neutral x 10  reps Supine cervical flexion from extended position to neutral x 10 reps Sidelying lateral cervical SB x 10 reps B  Added to HEP, see bolded below   PATIENT EDUCATION:  Education details: TPDN, continue HEP, added to HEP Person educated: Patient Education method: Explanation, Demonstration, and Handouts Education comprehension: verbalized understanding, returned demonstration, and needs further education  HOME EXERCISE PROGRAM: Access Code: [email or web address] URL: [email or web address] Date: 12/19/2023 Prepared by: Peter Congo  Exercises - Scapular Retraction with Resistance  - 1 x daily - 7 x weekly - 3 sets - 10 reps - Standing Shoulder Flexion Full Range  - 1 x daily - 7 x weekly - 3 sets - 10 reps - Standing Shoulder Scaption  - 1 x daily - 7 x weekly - 3 sets - 10 reps - Child's Pose with Thread the Needle  - 1 x daily - 7 x weekly - 1 sets - 3-5 reps - 30 sec hold - Supine Chin Tuck with Towel  - 1 x daily - 7 x weekly - 3 sets - 10 reps - Sternocleidomastoid Stretch  - 1 x daily - 7 x weekly - 1 sets - 3-5 reps - 30-60 sec hold - Sternocleidomastoid Release  - 1 x daily - 7 x weekly - 1 sets - 3-5 reps - 30-60 sec hold - Sidelying Cervical Sidebending  - 1 x daily - 7 x weekly - 3 sets - 10 reps - Prone Neck Extension at Table Edge  - 1 x daily - 7 x weekly - 3 sets - 10 reps - Supine Segmental Cervical Flexion  - 1 x daily - 7 x weekly - 3 sets - 10 reps  ASSESSMENT:  CLINICAL IMPRESSION: Emphasis of skilled PT session on performing DN to address pain and tightness in cervical muscles and adding stretches and strengthening exercises to HEP. Pt reports feeling better by end of PT session. Pt continues to benefit from skilled PT services to work towards LTGs with improved postural strengthening in order to increase her independence with management of her pain symptoms. Continue POC.    OBJECTIVE IMPAIRMENTS: decreased activity tolerance, decreased ROM, decreased  strength, impaired perceived functional ability, improper body mechanics, postural dysfunction, and pain.   ACTIVITY LIMITATIONS: carrying and lifting  PARTICIPATION LIMITATIONS:  unable to work due to chronic pain and migraines  PERSONAL FACTORS: Time since onset of injury/illness/exacerbation and 3+ comorbidities:    alcohol use disorder, opioid use disorder, bipolar disorder, migraines, GERD, h/o pancreatitis, ADHDare also affecting patient's functional outcome.   REHAB POTENTIAL: Good  CLINICAL DECISION MAKING: Stable/uncomplicated  EVALUATION COMPLEXITY: Low   GOALS: Goals reviewed with patient? Yes  SHORT TERM GOALS: Target date: 01/05/2024  Pt will be independent with initial HEP for improved cervical ROM, improved posture and management of pain symptoms in order to build upon functional gains made in therapy. Baseline:  Goal status: MET  LONG TERM GOALS: Target date: 01/26/2024   Pt will be independent with final HEP for improved cervical ROM, improved posture and management of pain symptoms in order to build upon functional gains made in therapy. Baseline:  Goal status: INITIAL  2.  Pt will improve her score on the NDI to 18/50 to demonstrate improved function and decreased disability level. Baseline: 29/50 (3/21), 23/50 (4/7) Goal status: REVISED/UPGRADED  3.  Pt will improve her score on the HDI to 52/100 to demonstrate improved function and decreased disability level. Baseline: 100/100 (3/21), 72/100 (4/7) Goal status: REVISED/UPGRADED  4.  Pt will improve her cervical ROM in limited motions by >/= 10 degrees for improved function. Baseline:  Active ROM AROM (deg) eval AROM (deg) 01/01/24  Flexion 35* 45*  Extension 35* 42*  Right lateral flexion 25* 25*  Left lateral flexion 25* (Pain in R UT) 30*  Right rotation 60* 60*  Left rotation 50* 53*   (Blank rows = not tested)  Goal status: IN PROGRESS     PLAN:  PT FREQUENCY: 2x/week  PT DURATION: 6  weeks  PLANNED INTERVENTIONS: 97164- PT Re-evaluation, 97110-Therapeutic exercises, 97530- Therapeutic activity, [id number]- Neuromuscular re-education, 97535- Self Care, 54098- Manual therapy, 11914- Electrical stimulation (manual), Patient/Family education, Taping, Dry Needling, Joint mobilization, Spinal mobilization, Cryotherapy, and Moist heat  PLAN FOR NEXT SESSION: how is new HEP? TPDN around scapula and cervical region, lats , UT, median nerve glide, rhomboid strengthening, postural stabilization; suboccipital release and distraction/traction?, kinesiotape for postural reminders, decrease arch in back, lat pulldowns, DN SCM, lat stretch on wall, wall angels, resisted ER, pec stretch   Lorita Rosa, PT Lorita Rosa, PT, DPT, CSRS  For all possible CPT codes, reference the Planned Interventions line above.     Check all conditions that are expected to impact treatment: {Conditions expected to impact treatment:Psychological or psychiatric disorders   If treatment provided at initial evaluation, no treatment charged due to lack of authorization.      01/09/2024, 8:42 AM

## 2024-01-11 ENCOUNTER — Ambulatory Visit: Admitting: Physical Therapy

## 2024-01-11 ENCOUNTER — Encounter: Admitting: Family Medicine

## 2024-01-16 ENCOUNTER — Ambulatory Visit: Admitting: Physical Therapy

## 2024-01-16 DIAGNOSIS — M25512 Pain in left shoulder: Secondary | ICD-10-CM | POA: Diagnosis not present

## 2024-01-16 DIAGNOSIS — M25511 Pain in right shoulder: Secondary | ICD-10-CM | POA: Diagnosis not present

## 2024-01-16 DIAGNOSIS — M542 Cervicalgia: Secondary | ICD-10-CM | POA: Diagnosis not present

## 2024-01-16 DIAGNOSIS — M5459 Other low back pain: Secondary | ICD-10-CM | POA: Diagnosis not present

## 2024-01-16 DIAGNOSIS — G8929 Other chronic pain: Secondary | ICD-10-CM | POA: Diagnosis not present

## 2024-01-16 DIAGNOSIS — M6281 Muscle weakness (generalized): Secondary | ICD-10-CM | POA: Diagnosis not present

## 2024-01-16 NOTE — Therapy (Signed)
 OUTPATIENT PHYSICAL THERAPY CERVICAL TREATMENT   Patient Name: Gail Blackburn MRN: [id number] DOB:1976-03-04, 48 y.o., female Today's Date: 01/16/2024  END OF SESSION:  PT End of Session - 01/16/24 0803     Visit Number 9    Number of Visits 13   with eval   Date for PT Re-Evaluation 02/09/24    Authorization Type Medicaid [employer]    Authorization - Visit Number 3    Authorization - Number of Visits 4   through 02/02/24   PT Start Time 0800    PT Stop Time 0839    PT Time Calculation (min) 39 min    Activity Tolerance Patient tolerated treatment well    Behavior During Therapy WFL for tasks assessed/performed                     Past Medical History:  Diagnosis Date   Anemia    PRIOR HISTORY   Anxiety    Asthma    Bipolar disorder (HCC)    Borderline   Depression    Fibromyalgia    Genital herpes simplex    Headache    MIGRAINES   Hepatic lesion 2022   interminate   Hypothyroidism 2009   TOOK MEDS FOR FEW WEEKS NO MEDS NOW   Migraine    Pancreatitis    Splenic vein thrombosis    Thyroid disease    treated in past not sure if too high or too low   Past Surgical History:  Procedure Laterality Date   COLONOSCOPY     DILATION AND EVACUATION N/A 05/06/2016   Procedure: DILATATION AND EVACUATION;  Surgeon: Rogene Claude, MD;  Location: [facility];  Service: Gynecology;  Laterality: N/A;   FOOT SURGERY     TEETH PULLED  03/2016   WISDOM TOOTH EXTRACTION     Patient Active Problem List   Diagnosis Date Noted   Pancreatitis, acute 02/19/2023   Pancreatitis 02/19/2023   Abdominal pain 09/24/2022   Acute pancreatitis 09/24/2022   Neck pain 04/26/2021   Splenic vein thrombosis    Alcohol use disorder, moderate, dependence (HCC)    Alcohol-induced acute pancreatitis    Alcohol withdrawal syndrome without complication (HCC)    Hyponatremia    Pain of upper abdomen 10/28/2020   Tinea manuum 05/21/2019   Abdominal wall lump 02/21/2018   Chronic  fatigue syndrome with fibromyalgia 02/24/2017   Skin lesion of left leg 02/10/2017   History of abnormal cervical Pap smear 02/10/2017   Anxiety 10/15/2014   Depression 10/15/2014   Migraine 10/15/2014   Chronic pain 10/15/2014   Low TSH level 10/15/2014   Tobacco use disorder 10/15/2014    PCP: Carey Chapman, MD  REFERRING PROVIDER: Glory Larsen, MD  REFERRING DIAG: [phone] (ICD-10-CM) - Chronic migraine without aura, with intractable migraine, so stated, with status migrainosus M54.2 (ICD-10-CM) - Cervicalgia M54.2,G89.29 (ICD-10-CM) - Chronic neck pain  THERAPY DIAG:  Muscle weakness (generalized)  Chronic pain of both shoulders  Cervicalgia  Other low back pain  Rationale for Evaluation and Treatment: Rehabilitation  ONSET DATE: 12/12/2023 (referral date)  SUBJECTIVE:  SUBJECTIVE STATEMENT: Pt reports that she had a horrible migraine last week and had to cancel PT, has not had one that bad in a while. Pt reports her pain today is 5/10.  Pt noticed a lot less tightness after last visit after PT/dry needling. Pt doing some of her exercises.  Pt reports that her L shoulder blade has been feeling good, does have pain/tightness in neck and L upper trap today.     Hand dominance: Right  PERTINENT HISTORY:  PMH: alcohol use disorder, opioid use disorder, bipolar disorder, migraines, GERD, h/o pancreatitis, ADHD  PAIN:  Are you having pain? Yes: NPRS scale: 5/10 Pain location: back of neck down into L shoulder, upper traps Pain description: "deep, gnawing pain", fire, tension Aggravating factors: up and moving for 1.5-2 hours Relieving factors: heating pad  PRECAUTIONS: None  RED FLAGS: Cervical red flags: None      WEIGHT BEARING RESTRICTIONS: No  FALLS:  Has  patient fallen in last 6 months? No  LIVING ENVIRONMENT: Lives with: lives with their family (moved back in with her mom)  OCCUPATION:  on disability  PLOF: Independent with gait and Independent with transfers  PATIENT GOALS: "I want my pain to go away", "be able to be up more 1.5 hours without having pain"  NEXT MD VISIT: Dr. Tresia Fruit June 2025  OBJECTIVE:  Note: Objective measures were completed at Evaluation unless otherwise noted.  DIAGNOSTIC FINDINGS:  Most recent imaging in chart: Cervical Spine MRI 07/04/2021 IMPRESSION: Cervical spondylosis, as outlined and with findings most notably as follows.   At C6-C7, there is mild disc degeneration. Disc bulge with bilateral uncovertebral hypertrophy. Superimposed small right center/foraminal disc extrusion with slight cranial migration. The disc extrusion effaces the right ventral thecal sac, narrowing the right foraminal entry zone, with potential to affect the exiting right C7 nerve root. Lateral to this, there is moderate right neural foraminal narrowing. Moderate left neural foraminal narrowing.   At C5-C6, there is a shallow disc bulge. Superimposed tiny right center disc protrusion. Uncovertebral hypertrophy (greater on the right). No significant spinal canal stenosis. Mild relative right neural foraminal narrowing.   At C4-C5, there is a 5 x 3 mm central disc protrusion. The disc protrusion mildly narrows the spinal canal, contacting and minimally flattening the ventral spinal cord.   Straightening of the expected cervical lordosis.  PATIENT SURVEYS:  NDI 29/50 HDI: 100/100, 100% impairment  COGNITION: Overall cognitive status: Within functional limits for tasks assessed  SENSATION: Occasional shooting pain down L arm, N/T occasionally, middle two fingers turn white  POSTURE: rounded shoulders and forward head  PALPATION: Tenderness in suboccipitals and R upper trap > L upper trap Shoulder blades widely  spaced and TTP around shoulder blades  CERVICAL ROM:   Active ROM AROM (deg) eval  Flexion 35*  Extension 35*  Right lateral flexion 25*  Left lateral flexion 25* (Pain in R UT)  Right rotation 60*  Left rotation 50*   (Blank rows = not tested)  UPPER EXTREMITY ROM:  Active ROM Right eval Left eval  Shoulder flexion [facility]  Shoulder extension    Shoulder abduction [facility]  Shoulder adduction    Shoulder extension    Shoulder internal rotation    Shoulder external rotation    Elbow flexion    Elbow extension    Wrist flexion    Wrist extension    Wrist ulnar deviation    Wrist radial deviation    Wrist pronation  Wrist supination     (Blank rows = not tested)  UPPER EXTREMITY MMT:  MMT Right eval Left eval  Shoulder flexion 5 5  Shoulder extension    Shoulder abduction 5 5  Shoulder adduction    Shoulder extension    Shoulder internal rotation    Shoulder external rotation    Middle trapezius    Lower trapezius    Elbow flexion 5 5  Elbow extension 5 5  Wrist flexion    Wrist extension    Wrist ulnar deviation    Wrist radial deviation    Wrist pronation    Wrist supination    Grip strength Slightly decreased Slightly decreased   (Blank rows = not tested)  CERVICAL SPECIAL TESTS:  Upper limb tension test (ULTT): Positive Median nerve: + Ulnar nerve: - Radial nerve: -   TREATMENT:  TherAct Trigger Point Dry Needling  Subsequent Treatment: Instructions provided previously at initial dry needling treatment.   Patient Verbal Consent Given: Yes Education Handout Provided: Yes Muscles Treated: L and R suboccipitals, L and R cervical paraspinals, L and R SCM, L upper trap Electrical Stimulation Performed: No Treatment Response/Outcome: deep ache/pressure; muscle twitch detected   TherEx To address ongoing pec tightness and periscapular weakness: Standing doorway pec stretch 3 x 30 sec each B Standing wall lat stretch, minimal stretch  detected Standing wall angels, increase in back pain Seated resisted ER x 30 reps with red TB Seated resisted lat pulldowns x 15 reps with red TB   PATIENT EDUCATION:  Education details: TPDN, continue HEP, added to HEP Person educated: Patient Education method: Explanation, Demonstration, and Handouts Education comprehension: verbalized understanding, returned demonstration, and needs further education  HOME EXERCISE PROGRAM: Access Code: [email or web address] URL: [email or web address] Date: 12/19/2023 Prepared by: Lorita Rosa  Exercises - Scapular Retraction with Resistance  - 1 x daily - 7 x weekly - 3 sets - 10 reps - Standing Shoulder Flexion Full Range  - 1 x daily - 7 x weekly - 3 sets - 10 reps - Standing Shoulder Scaption  - 1 x daily - 7 x weekly - 3 sets - 10 reps - Child's Pose with Thread the Needle  - 1 x daily - 7 x weekly - 1 sets - 3-5 reps - 30 sec hold - Supine Chin Tuck with Towel  - 1 x daily - 7 x weekly - 3 sets - 10 reps - Sternocleidomastoid Stretch  - 1 x daily - 7 x weekly - 1 sets - 3-5 reps - 30-60 sec hold - Sternocleidomastoid Release  - 1 x daily - 7 x weekly - 1 sets - 3-5 reps - 30-60 sec hold - Sidelying Cervical Sidebending  - 1 x daily - 7 x weekly - 3 sets - 10 reps - Prone Neck Extension at Table Edge  - 1 x daily - 7 x weekly - 3 sets - 10 reps - Supine Segmental Cervical Flexion  - 1 x daily - 7 x weekly - 3 sets - 10 reps - Single Arm Doorway Pec Stretch at 90 Degrees Abduction  - 1 x daily - 7 x weekly - 1 sets - 3-5 reps - 30-60 sec hold - Seated High Lat Pull Down with Overhead Anchored Resistance  - 1 x daily - 7 x weekly - 3 sets - 10 reps - Standing Shoulder External Rotation with Resistance  - 1 x daily - 7 x weekly - 3 sets - 10 reps   ASSESSMENT:  CLINICAL IMPRESSION: Emphasis of skilled PT session on performing DN to address pain and tightness in cervical muscles and adding stretches and strengthening exercises to HEP. Pt  reports feeling better by end of PT session and can tell she was worked her muscles. Pt continues to benefit from skilled PT services to work towards LTGs with improved postural strengthening in order to increase her independence with management of her pain symptoms. Continue POC.    OBJECTIVE IMPAIRMENTS: decreased activity tolerance, decreased ROM, decreased strength, impaired perceived functional ability, improper body mechanics, postural dysfunction, and pain.   ACTIVITY LIMITATIONS: carrying and lifting  PARTICIPATION LIMITATIONS:  unable to work due to chronic pain and migraines  PERSONAL FACTORS: Time since onset of injury/illness/exacerbation and 3+ comorbidities:    alcohol use disorder, opioid use disorder, bipolar disorder, migraines, GERD, h/o pancreatitis, ADHDare also affecting patient's functional outcome.   REHAB POTENTIAL: Good  CLINICAL DECISION MAKING: Stable/uncomplicated  EVALUATION COMPLEXITY: Low   GOALS: Goals reviewed with patient? Yes  SHORT TERM GOALS: Target date: 01/05/2024  Pt will be independent with initial HEP for improved cervical ROM, improved posture and management of pain symptoms in order to build upon functional gains made in therapy. Baseline:  Goal status: MET  LONG TERM GOALS: Target date: 01/26/2024   Pt will be independent with final HEP for improved cervical ROM, improved posture and management of pain symptoms in order to build upon functional gains made in therapy. Baseline:  Goal status: INITIAL  2.  Pt will improve her score on the NDI to 18/50 to demonstrate improved function and decreased disability level. Baseline: 29/50 (3/21), 23/50 (4/7) Goal status: REVISED/UPGRADED  3.  Pt will improve her score on the HDI to 52/100 to demonstrate improved function and decreased disability level. Baseline: 100/100 (3/21), 72/100 (4/7) Goal status: REVISED/UPGRADED  4.  Pt will improve her cervical ROM in limited motions by >/= 10 degrees  for improved function. Baseline:  Active ROM AROM (deg) eval AROM (deg) 01/01/24  Flexion 35* 45*  Extension 35* 42*  Right lateral flexion 25* 25*  Left lateral flexion 25* (Pain in R UT) 30*  Right rotation 60* 60*  Left rotation 50* 53*   (Blank rows = not tested)  Goal status: IN PROGRESS     PLAN:  PT FREQUENCY: 2x/week  PT DURATION: 6 weeks  PLANNED INTERVENTIONS: 97164- PT Re-evaluation, 97110-Therapeutic exercises, 97530- Therapeutic activity, 97112- Neuromuscular re-education, 97535- Self Care, 62952- Manual therapy, 84132- Electrical stimulation (manual), Patient/Family education, Taping, Dry Needling, Joint mobilization, Spinal mobilization, Cryotherapy, and Moist heat  PLAN FOR NEXT SESSION: TPDN around scapula and cervical region, lats , UT, median nerve glide, rhomboid strengthening, postural stabilization; suboccipital release and distraction/traction?,  decrease arch in back, DN SCM, lat stretch on wall, shoulder resistance exercises, try prone IYTW again  Will have to request more visits next session, 10th visit PN   Lorita Rosa, PT Lorita Rosa, PT, DPT, CSRS  For all possible CPT codes, reference the Planned Interventions line above.     Check all conditions that are expected to impact treatment: {Conditions expected to impact treatment:Psychological or psychiatric disorders   If treatment provided at initial evaluation, no treatment charged due to lack of authorization.      01/16/2024, 8:51 AM

## 2024-01-17 DIAGNOSIS — F331 Major depressive disorder, recurrent, moderate: Secondary | ICD-10-CM | POA: Diagnosis not present

## 2024-01-17 DIAGNOSIS — F411 Generalized anxiety disorder: Secondary | ICD-10-CM | POA: Diagnosis not present

## 2024-01-19 ENCOUNTER — Ambulatory Visit: Admitting: Physical Therapy

## 2024-01-22 ENCOUNTER — Other Ambulatory Visit: Payer: Self-pay | Admitting: *Deleted

## 2024-01-22 NOTE — Telephone Encounter (Signed)
 Received Rx refill request from [facility] for Propranolol  ER 60 mg.

## 2024-01-23 ENCOUNTER — Ambulatory Visit: Admitting: Physical Therapy

## 2024-01-23 MED ORDER — PROPRANOLOL HCL ER 60 MG PO CP24: 60.0000 mg | ORAL_CAPSULE | Freq: Every day | ORAL | 3 refills | Status: DC

## 2024-01-24 DIAGNOSIS — F331 Major depressive disorder, recurrent, moderate: Secondary | ICD-10-CM | POA: Diagnosis not present

## 2024-01-24 DIAGNOSIS — F411 Generalized anxiety disorder: Secondary | ICD-10-CM | POA: Diagnosis not present

## 2024-01-25 ENCOUNTER — Encounter: Payer: Self-pay | Admitting: Neurology

## 2024-01-25 MED ORDER — PROPRANOLOL HCL ER 60 MG PO CP24: 60.0000 mg | ORAL_CAPSULE | Freq: Every day | ORAL | 3 refills | Status: DC

## 2024-01-26 ENCOUNTER — Ambulatory Visit: Admitting: Physical Therapy

## 2024-02-05 ENCOUNTER — Encounter: Payer: Self-pay | Admitting: Neurology

## 2024-02-05 DIAGNOSIS — G518 Other disorders of facial nerve: Secondary | ICD-10-CM | POA: Diagnosis not present

## 2024-02-05 DIAGNOSIS — G43719 Chronic migraine without aura, intractable, without status migrainosus: Secondary | ICD-10-CM | POA: Diagnosis not present

## 2024-02-05 DIAGNOSIS — M542 Cervicalgia: Secondary | ICD-10-CM

## 2024-02-05 DIAGNOSIS — G43711 Chronic migraine without aura, intractable, with status migrainosus: Secondary | ICD-10-CM

## 2024-02-05 DIAGNOSIS — G43009 Migraine without aura, not intractable, without status migrainosus: Secondary | ICD-10-CM

## 2024-02-05 DIAGNOSIS — M791 Myalgia, unspecified site: Secondary | ICD-10-CM | POA: Diagnosis not present

## 2024-02-05 DIAGNOSIS — G8929 Other chronic pain: Secondary | ICD-10-CM

## 2024-02-05 NOTE — Telephone Encounter (Signed)
 Pt last seen 12-07-2023 botox .  Has had PT but missed from what I see several appt.s  is asking to renew PT referral.  Ok to do?

## 2024-02-06 DIAGNOSIS — F411 Generalized anxiety disorder: Secondary | ICD-10-CM | POA: Diagnosis not present

## 2024-02-06 DIAGNOSIS — F331 Major depressive disorder, recurrent, moderate: Secondary | ICD-10-CM | POA: Diagnosis not present

## 2024-02-06 NOTE — Telephone Encounter (Signed)
 Per Dr. Tresia Fruit ok to do.  Order placed and need cosign.

## 2024-02-20 NOTE — Telephone Encounter (Signed)
 Delivery pend pt consent.

## 2024-02-21 DIAGNOSIS — F331 Major depressive disorder, recurrent, moderate: Secondary | ICD-10-CM | POA: Diagnosis not present

## 2024-02-21 DIAGNOSIS — F411 Generalized anxiety disorder: Secondary | ICD-10-CM | POA: Diagnosis not present

## 2024-02-29 ENCOUNTER — Ambulatory Visit: Attending: Neurology | Admitting: Physical Therapy

## 2024-02-29 DIAGNOSIS — M25511 Pain in right shoulder: Secondary | ICD-10-CM | POA: Diagnosis not present

## 2024-02-29 DIAGNOSIS — G8929 Other chronic pain: Secondary | ICD-10-CM | POA: Diagnosis not present

## 2024-02-29 DIAGNOSIS — M5459 Other low back pain: Secondary | ICD-10-CM | POA: Diagnosis not present

## 2024-02-29 DIAGNOSIS — M25512 Pain in left shoulder: Secondary | ICD-10-CM | POA: Diagnosis not present

## 2024-02-29 DIAGNOSIS — M542 Cervicalgia: Secondary | ICD-10-CM | POA: Insufficient documentation

## 2024-02-29 DIAGNOSIS — M6281 Muscle weakness (generalized): Secondary | ICD-10-CM | POA: Insufficient documentation

## 2024-02-29 DIAGNOSIS — G43009 Migraine without aura, not intractable, without status migrainosus: Secondary | ICD-10-CM | POA: Diagnosis not present

## 2024-02-29 DIAGNOSIS — G43711 Chronic migraine without aura, intractable, with status migrainosus: Secondary | ICD-10-CM | POA: Insufficient documentation

## 2024-02-29 NOTE — Therapy (Addendum)
 OUTPATIENT PHYSICAL THERAPY CERVICAL EVALUATION   Patient Name: Gail Blackburn MRN: [id number] DOB:1975-10-18, 48 y.o., female Today's Date: 02/29/2024  END OF SESSION:  PT End of Session - 02/29/24 1452     Visit Number 1    Number of Visits 13   with eval   Date for PT Re-Evaluation 05/23/24   to allow for scheduling delays   Authorization Type Medicaid [employer]    PT Start Time 1450    PT Stop Time 1529    PT Time Calculation (min) 39 min    Activity Tolerance Patient tolerated treatment well    Behavior During Therapy WFL for tasks assessed/performed             Past Medical History:  Diagnosis Date   Anemia    PRIOR HISTORY   Anxiety    Asthma    Bipolar disorder (HCC)    Borderline   Depression    Fibromyalgia    Genital herpes simplex    Headache    MIGRAINES   Hepatic lesion 2022   interminate   Hypothyroidism 2009   TOOK MEDS FOR FEW WEEKS NO MEDS NOW   Migraine    Pancreatitis    Splenic vein thrombosis    Thyroid disease    treated in past not sure if too high or too low   Past Surgical History:  Procedure Laterality Date   COLONOSCOPY     DILATION AND EVACUATION N/A 05/06/2016   Procedure: DILATATION AND EVACUATION;  Surgeon: Rogene Claude, MD;  Location: [facility];  Service: Gynecology;  Laterality: N/A;   FOOT SURGERY     TEETH PULLED  03/2016   WISDOM TOOTH EXTRACTION     Patient Active Problem List   Diagnosis Date Noted   Pancreatitis, acute 02/19/2023   Pancreatitis 02/19/2023   Abdominal pain 09/24/2022   Acute pancreatitis 09/24/2022   Neck pain 04/26/2021   Splenic vein thrombosis    Alcohol use disorder, moderate, dependence (HCC)    Alcohol-induced acute pancreatitis    Alcohol withdrawal syndrome without complication (HCC)    Hyponatremia    Pain of upper abdomen 10/28/2020   Tinea manuum 05/21/2019   Abdominal wall lump 02/21/2018   Chronic fatigue syndrome with fibromyalgia 02/24/2017   Skin lesion of left leg  02/10/2017   History of abnormal cervical Pap smear 02/10/2017   Anxiety 10/15/2014   Depression 10/15/2014   Migraine 10/15/2014   Chronic pain 10/15/2014   Low TSH level 10/15/2014   Tobacco use disorder 10/15/2014    PCP: Carey Chapman, MD  REFERRING PROVIDER: Glory Larsen, MD  REFERRING DIAG: [phone] (ICD-10-CM) - Chronic migraine without aura, with intractable migraine, so stated, with status migrainosus M54.2 (ICD-10-CM) - Cervicalgia M54.2,G89.29 (ICD-10-CM) - Chronic neck pain G43.009 (ICD-10-CM) - Migraine without aura and without status migrainosus, not intractable  THERAPY DIAG:  Muscle weakness (generalized)  Chronic pain of both shoulders  Cervicalgia  Other low back pain  Rationale for Evaluation and Treatment: Rehabilitation  ONSET DATE: 02/06/2024 (referral date)  SUBJECTIVE:  SUBJECTIVE STATEMENT:  Pt returns to PT after being gone for the past month a half. She states, "I hurt all the time" but experiences different levels of pain depending on the day, can be on her R or L side. She also gets pain on the back of her neck, along her shoulder blades, and gets migraines. She has been keeping a pain journal.  Pt experienced the most amount of relief of her pain with dry needling. She was scheduled to see a neurosurgeon but cancelled that when her pain improved.   From initial PT eval: Pt reports a long history of migraines and reports that the severity of them varies but that they are stress-induced (depression, anxiety, stress all cause her migraines). Pt reports she also has neck pain and shooting pain down into her L shoulder blade and saw PT for this a few years ago. Pt reports she does have improved cervical ROM since working with PT last time. Pt reports that  if she is active for 1.5-2 hours she will get the pain and migraines.  Pt reports she has tried muscle relaxers and they don't help her pain, uses a heating pad primarily. [facility] for trigger point injections. She has also had Botox  injections as well.  Hand dominance: Right  PERTINENT HISTORY:  PMH: alcohol use disorder, opioid use disorder, bipolar disorder, migraines, GERD, h/o pancreatitis, ADHD  PAIN:  Are you having pain? Yes: NPRS scale: 6/10 Pain location: back of neck down into L shoulder, upper traps Pain description: "deep, gnawing pain", fire, tension Aggravating factors: up and moving for 1.5-2 hours Relieving factors: heating pad  PRECAUTIONS: None  RED FLAGS: Cervical red flags: None     WEIGHT BEARING RESTRICTIONS: No  FALLS:  Has patient fallen in last 6 months? No  LIVING ENVIRONMENT: Lives with: lives with their family (moved back in with her mom)  OCCUPATION:  on disability  PLOF: Independent with gait and Independent with transfers  PATIENT GOALS: "I want my pain to go away", "be able to be up more 1.5 hours without having pain"  NEXT MD VISIT: Dr. Tresia Fruit June 2025 (03/05/24 for Botox )  OBJECTIVE:  Note: Objective measures were completed at Evaluation unless otherwise noted.  DIAGNOSTIC FINDINGS:  Most recent imaging in chart: Cervical Spine MRI 07/04/2021 IMPRESSION: Cervical spondylosis, as outlined and with findings most notably as follows.   At C6-C7, there is mild disc degeneration. Disc bulge with bilateral uncovertebral hypertrophy. Superimposed small right center/foraminal disc extrusion with slight cranial migration. The disc extrusion effaces the right ventral thecal sac, narrowing the right foraminal entry zone, with potential to affect the exiting right C7 nerve root. Lateral to this, there is moderate right neural foraminal narrowing. Moderate left neural foraminal  narrowing.   At C5-C6, there is a shallow disc bulge. Superimposed tiny right center disc protrusion. Uncovertebral hypertrophy (greater on the right). No significant spinal canal stenosis. Mild relative right neural foraminal narrowing.   At C4-C5, there is a 5 x 3 mm central disc protrusion. The disc protrusion mildly narrows the spinal canal, contacting and minimally flattening the ventral spinal cord.   Straightening of the expected cervical lordosis.  PATIENT SURVEYS:  NDI 32/50, severe disability HDI:76/100, 76% impairment  COGNITION: Overall cognitive status: Within functional limits for tasks assessed  SENSATION: Occasional shooting pain down L arm, N/T occasionally, middle two fingers turn white  POSTURE: rounded shoulders and forward head  PALPATION: Tenderness in suboccipitals and R upper trap > L upper trap Shoulder blades widely spaced and TTP around shoulder blades  CERVICAL ROM:   Active ROM AROM (deg) Initial eval AROM (Deg) Re-eval 02/29/24  Pain 02/29/24  Flexion 35* 32*   Extension 35* 36*   Right lateral flexion 25* 20* Pain in L shoulder  Left lateral flexion 25* (Pain in R UT) 22* Pain in L shoulder  Right rotation 60* 55* Mild pain  Left rotation 50* 40* Mild pain   (Blank rows = not tested)  UPPER EXTREMITY ROM:  Active ROM Right eval Left eval  Shoulder flexion [facility]  Shoulder extension    Shoulder abduction [facility]  Shoulder adduction    Shoulder extension    Shoulder internal rotation    Shoulder external rotation    Elbow flexion    Elbow extension    Wrist flexion    Wrist extension    Wrist ulnar deviation    Wrist radial deviation    Wrist pronation    Wrist supination     (Blank rows = not tested)  UPPER EXTREMITY MMT:  MMT Right eval Left eval  Shoulder flexion 5 5  Shoulder extension    Shoulder abduction 5 5  Shoulder adduction    Shoulder extension    Shoulder internal rotation    Shoulder external  rotation    Middle trapezius    Lower trapezius    Elbow flexion 5 5  Elbow extension 5 5  Wrist flexion    Wrist extension    Wrist ulnar deviation    Wrist radial deviation    Wrist pronation    Wrist supination    Grip strength Slightly decreased Slightly decreased   (Blank rows = not tested)  CERVICAL SPECIAL TESTS:  Upper limb tension test (ULTT): Positive Median nerve: + Ulnar nerve: - Radial nerve: -   TREATMENT: Not charged for treatment at initial eval due to insurance      Trigger Point Dry Needling  Initial Treatment: Pt instructed on Dry Needling rational, procedures, and possible side effects. Pt instructed to expect mild to moderate muscle soreness later in the day and/or into the next day.  Pt instructed in methods to reduce muscle soreness. Pt instructed to continue prescribed HEP. Because Dry Needling was performed over or adjacent to a lung field, pt was educated on S/S of pneumothorax and to seek immediate medical attention should they occur.  Patient was educated on signs and symptoms of infection and other risk factors and advised to seek medical attention should they occur.  Patient verbalized understanding of these instructions and education.   Patient Verbal Consent Given: Yes Education Handout Provided: Yes Muscles Treated: L and R cervical paraspinals, L and R suboccipitals Electrical Stimulation Performed: No Treatment Response/Outcome: deep ache/pressure; muscle twitch detected  Trigger Point Dry Needling  What is Trigger Point Dry Needling (DN)? DN is a physical therapy technique used to treat muscle pain and dysfunction. Specifically, DN helps deactivate muscle trigger points (muscle knots).  A thin filiform needle is used to penetrate the skin and stimulate the underlying trigger point. The goal is for a local twitch response (LTR) to occur and for the trigger point to relax. No medication of any kind is injected during the procedure.    What Does Trigger Point Dry Needling Feel Like?  The procedure feels different for each individual patient. Some patients report that they do not actually feel the needle enter the  skin and overall the process is not painful. Very mild bleeding may occur. However, many patients feel a deep cramping in the muscle in which the needle was inserted. This is the local twitch response.   How Will I feel after the treatment? Soreness is normal, and the onset of soreness may not occur for a few hours. Typically this soreness does not last longer than two days.  Bruising is uncommon, however; ice can be used to decrease any possible bruising.  In rare cases feeling tired or nauseous after the treatment is normal. In addition, your symptoms may get worse before they get better, this period will typically not last longer than 24 hours.   What Can I do After My Treatment? Increase your hydration by drinking more water for the next 24 hours.  You may place ice or heat on the areas treated that have become sore, however, do not use heat on inflamed or bruised areas. Heat often brings more relief post needling. You can continue your regular activities, but vigorous activity is not recommended initially after the treatment for 24 hours. DN is best combined with other physical therapy such as strengthening, stretching, and other therapies.   What are the complications? While your therapist has had extensive training in minimizing the risks of trigger point dry needling, it is important to understand the risks of any procedure.  Risks include bleeding, pain, fatigue, hematoma, infection, vertigo, nausea or nerve involvement. Monitor for any changes to your skin or sensation. Contact your therapist or MD with concerns.  A rare but serious complication is a pneumothorax over or near your middle and upper chest and back If you have dry needling in this area, monitor for the following symptoms: Shortness of breath  on exertion and/or Difficulty taking a deep breath and/or Chest Pain and/or A dry cough If any of the above symptoms develop, please go to the nearest emergency room or call 911. Tell them you had dry needling over your thorax and report any symptoms you are having. Please follow-up with your treating therapist after you complete the medical evaluation.                                                                                                                           PATIENT EDUCATION:  Education details: Eval findings, PT POC, TPDN (patient has had this previously, handout provided) Person educated: Patient Education method: Explanation, Demonstration, and Handouts Education comprehension: verbalized understanding, returned demonstration, and needs further education  HOME EXERCISE PROGRAM: To be initiated  ASSESSMENT:  CLINICAL IMPRESSION: Patient is a 48 year old female referred to Neuro OPPT for cervicalgia and chronic migraines.   Pt's PMH is significant for: alcohol use disorder, opioid use disorder, bipolar disorder, migraines, GERD, h/o pancreatitis, ADHD. The following deficits were present during the exam: decreased cervical ROM, mildly impaired grip strength, positive median nerve compression in LUE, TTP in cervical and shoulder muscles, and postural dysfunction with wide-set scapula.  Pt would benefit from skilled PT to address these impairments and functional limitations to maximize functional mobility independence and increase independent with management of her pain symptoms.    OBJECTIVE IMPAIRMENTS: decreased activity tolerance, decreased ROM, decreased strength, impaired perceived functional ability, improper body mechanics, postural dysfunction, and pain.   ACTIVITY LIMITATIONS: carrying and lifting  PARTICIPATION LIMITATIONS: unable to work due to chronic pain and migraines  PERSONAL FACTORS: Time since onset of injury/illness/exacerbation and 3+ comorbidities:    alcohol use disorder, opioid use disorder, bipolar disorder, migraines, GERD, h/o pancreatitis, ADHDare also affecting patient's functional outcome.   REHAB POTENTIAL: Good  CLINICAL DECISION MAKING: Stable/uncomplicated  EVALUATION COMPLEXITY: Low   GOALS: Goals reviewed with patient? Yes  SHORT TERM GOALS: Target date: 04/11/2024  Pt will be independent with initial HEP for improved cervical ROM, improved posture and management of pain symptoms in order to build upon functional gains made in therapy. Baseline:  Goal status: INITIAL  LONG TERM GOALS: Target date: 05/23/2024  Pt will be independent with final HEP for improved cervical ROM, improved posture and management of pain symptoms in order to build upon functional gains made in therapy. Baseline:  Goal status: INITIAL  2.  Pt will improve her score on the NDI to 28/50 to demonstrate improved function and decreased disability level. Baseline: 32/50 (6/5) Goal status: INITIAL  3.  Pt will improve her score on the HDI to 70/100 to demonstrate improved function and decreased disability level. Baseline: 76/100 (6/5) Goal status: INITIAL  4.  Pt will improve her cervical ROM in limited motions by >/= 10 degrees for improved function. Baseline:  Active ROM AROM (deg) 02/29/24  Flexion 33*  Extension 36*  Right lateral flexion 20*  Left lateral flexion 22* (Pain in R UT)  Right rotation 55*  Left rotation 50*   Goal status: INITIAL     PLAN:  PT FREQUENCY: one time per month (due to financial strain of TPDN)  PT DURATION: 6 sessions over 6 months  PLANNED INTERVENTIONS: 97164- PT Re-evaluation, 97750- Physical Performance Testing, 97110-Therapeutic exercises, 97530- Therapeutic activity, [id number]- Neuromuscular re-education, 97535- Self Care, 91478- Manual therapy, [id number]- Electrical stimulation (manual), 20560 (1-2 muscles), 20561 (3+ muscles)- Dry Needling, Patient/Family education, Taping, Joint mobilization, Spinal  mobilization, Cryotherapy, and Moist heat  PLAN FOR NEXT SESSION: TPDN around scapula, median nerve glide, rhomboid strengthening, prone postural stabilization, how is HEP?   Kieth Hartis, PT Lorita Rosa, PT, DPT, CSRS  For all possible CPT codes, reference the Planned Interventions line above.     Check all conditions that are expected to impact treatment: {Conditions expected to impact treatment:Psychological or psychiatric disorders   If treatment provided at initial evaluation, no treatment charged due to lack of authorization.      02/29/2024, 3:33 PM

## 2024-03-05 ENCOUNTER — Ambulatory Visit: Admitting: Neurology

## 2024-03-05 VITALS — BP 104/68 | HR 95

## 2024-03-05 DIAGNOSIS — G43711 Chronic migraine without aura, intractable, with status migrainosus: Secondary | ICD-10-CM | POA: Diagnosis not present

## 2024-03-05 MED ORDER — ONABOTULINUMTOXINA 200 UNITS IJ SOLR
155.0000 [IU] | Freq: Once | INTRAMUSCULAR | Status: AC
Start: 2024-03-05 — End: 2024-03-05
  Administered 2024-03-05: 155 [IU] via INTRAMUSCULAR

## 2024-03-05 NOTE — Progress Notes (Signed)
 Botox - 200 units x 1 vial Lot: [id number] Expiration: 07/2026 NDC: 8119-1478-29  Bacteriostatic 0.9% Sodium Chloride - 4 mL  Lot: [id number] Expiration: 02/2025  NDC: 8657-8469-62  Dx: X52.841  S/P  Witnessed by Lurena Sally

## 2024-03-05 NOTE — Progress Notes (Signed)
 03/05/2024: Botox  has given her greater than 50% improvement in migraine frequency and severity. Is on Ajovy  too; On ajovy  and botox . Getting dry needling helping taylor at [facility] next door goes just for dryneedling $40,Worse right neck and left shoulder blade, Also gets trigger points at dr Margie Sheller. PT does the dry needling.  12/12/2023: awesome Patient is stable, Botox  has given her greater than 50% improvement in migraine frequency but she still had a burden of 8 migraine days a month and 15 total headache days a month so we started Ajovy  in December.  Patient feels that her migraines cause her jaw to ache in her jaw aching also can cause her migraines to worsen it is a trigger for migraines included 5 units in each masseter to see if that helps with migraine severity.  No orders of the defined types were placed in this encounter.     Physical Therapy: Cervical myofascial pain, migraines, neck pain: forward posture contributing to migraines and cervicalgia. Please evaluate and treat including dry needling, stretching, strengthening, manual therapy/massage, heating, TENS unit, exercising for scapular stabilization, pectoral stretching and rhomboid strengthening as clinically warranted as well as any other modality as recommended by evaluation.  09/19/2023: stable. After the 2nd to 3rd month of ajovy  see how she is doing and consider vyepti if needed.  06/27/2023:Patient is doing well > 50% improvement in migraine frequency but she still has 8 migraine days a month and 15 total headache days a month and feels she was much better controlled when she was on Ajovy  and would like it prescribed again.  No orders of the defined types were placed in this encounter.   04/04/2023 sytable 01/03/2023: stable, saw Dr Margie Sheller they took her insurance and even feeling better.   10/11/2022: Doing well on botox , > 50% improvement, She has 4-6 migriane days a month, < 10 total headache days a month, failed imitrex,  maxalt  and ubrelvy, zomig, relpax will try and get nurtec acutely approved. Dr Margie Sheller doesn't take her insurance. Referral for pain clinic fax to [facility] (reminded her to contact Dr. Cordell Destine) Phone: [phone], Fax:  [phone] for occipital neuralgia.    No orders of the defined types were placed in this encounter.    07/14/2022: Doing well on botox  for her migraines(>>50% relief of migraine severity, freq and duration with botox ) but still with considerable pain in occipital headaches and nerve blocks are the only thing that helps her occipital headaches. [facility].  No orders of the defined types were placed in this encounter.    04/26/2022: emgality  did not help. Ajovy  not approved. Botox  working great >> 50% improvement in migraine frequency. Needs acute management. Failed imitrex and maxalt  9work ok) Ablation of occipital nerves helped a lot. Now having 4 migraine days a month and < 10 total headache days a month needs a good acute management medication. Will try nurtec  No orders of the defined types were placed in this encounter.    01/26/2022: Botox  gives > 50% relief but still having 8 migraine days a month and > 15 headache days a month will try Ajovy , emgality  not helping, aimovig contraindicated due to constipation.     11/02/2021: stable 08/09/2021 ALL: She returns for Botox . >50% improvement, she has a lot of neck pain, getting injections at [facility]. Resend to [facility], she requested some dry needling, loved Camilo Cella.    Consent Form Botulism Toxin Injection  For Chronic Migraine    Reviewed orally with patient, additionally signature is on file:  Botulism toxin has been approved by the Federal drug administration for treatment of chronic migraine. Botulism toxin does not cure chronic migraine and it may not be effective in some patients.  The administration of  botulism toxin is accomplished by injecting a small amount of toxin into the muscles of the neck and head. Dosage must be titrated for each individual. Any benefits resulting from botulism toxin tend to wear off after 3 months with a repeat injection required if benefit is to be maintained. Injections are usually done every 3-4 months with maximum effect peak achieved by about 2 or 3 weeks. Botulism toxin is expensive and you should be sure of what costs you will incur resulting from the injection.  The side effects of botulism toxin use for chronic migraine may include:   -Transient, and usually mild, facial weakness with facial injections  -Transient, and usually mild, head or neck weakness with head/neck injections  -Reduction or loss of forehead facial animation due to forehead muscle weakness  -Eyelid drooping  -Dry eye  -Pain at the site of injection or bruising at the site of injection  -Double vision  -Potential unknown long term risks   Contraindications: You should not have Botox  if you are pregnant, nursing, allergic to albumin, have an infection, skin condition, or muscle weakness at the site of the injection, or have myasthenia gravis, Lambert-Eaton syndrome, or ALS.  It is also possible that as with any injection, there may be an allergic reaction or no effect from the medication. Reduced effectiveness after repeated injections is sometimes seen and rarely infection at the injection site may occur. All care will be taken to prevent these side effects. If therapy is given over a long time, atrophy and wasting in the muscle injected may occur. Occasionally the patient's become refractory to treatment because they develop antibodies to the toxin. In this event, therapy needs to be modified.  I have read the above information and consent to the administration of botulism toxin.    BOTOX  PROCEDURE NOTE FOR MIGRAINE HEADACHE  Contraindications and precautions discussed with  patient(above). Aseptic rocedure was observed and patient tolerated procedure.   The condition has existed for more than 6 months, and pt does not have a diagnosis of ALS, Myasthenia Gravis or Lambert-Eaton Syndrome.  Risks and benefits of injections discussed and pt agrees to proceed with the procedure.  Written consent obtained  These injections are medically necessary. Pt  receives good benefits from these injections. These injections do not cause sedations or hallucinations which the oral therapies may cause.   Description of procedure:  The patient was placed in a sitting position. The standard protocol was used for Botox  as follows, with 5 units of Botox  injected at each site:  -Procerus muscle, midline injection  -Corrugator muscle, bilateral injection  -Frontalis muscle, bilateral injection, with 2 sites each side, medial injection was performed in the upper one third of the frontalis muscle, in the region vertical from the medial inferior edge of the superior orbital rim. The lateral injection was again in the upper one third of the forehead vertically above the lateral limbus of the cornea, 1.5 cm lateral to the medial injection site.  -Temporalis muscle injection, 4 sites, bilaterally. The first injection was 3 cm above the tragus of the ear, second injection site was 1.5 cm to 3 cm up from the first injection site in line with  the tragus of the ear. The third injection site was 1.5-3 cm forward between the first 2 injection sites. The fourth injection site was 1.5 cm posterior to the second injection site. 5th site laterally in the temporalis  muscleat the level of the outer canthus.  -Occipitalis muscle injection, 3 sites, bilaterally. The first injection was done one half way between the occipital protuberance and the tip of the mastoid process behind the ear. The second injection site was done lateral and superior to the first, 1 fingerbreadth from the first injection. The third  injection site was 1 fingerbreadth superiorly and medially from the first injection site.  -Cervical paraspinal muscle injection, 2 sites, bilaterally. The first injection site was 1 cm from the midline of the cervical spine, 3 cm inferior to the lower border of the occipital protuberance. The second injection site was 1.5 cm superiorly and laterally to the first injection site.  -Trapezius muscle injection was performed at 3 sites, bilaterally. The first injection site was in the upper trapezius muscle halfway between the inflection point of the neck, and the acromion. The second injection site was one half way between the acromion and the first injection site. The third injection was done between the first injection site and the inflection point of the neck.  Will return for repeat injection in 3 months.   A total of 155 units of Botox  was prepared, 45 units of Botox  was injected as documented above, any Botox  not injected was wasted. The patient tolerated the procedure well, there were no complications of the above procedure.

## 2024-03-18 DIAGNOSIS — M791 Myalgia, unspecified site: Secondary | ICD-10-CM | POA: Diagnosis not present

## 2024-03-18 DIAGNOSIS — G518 Other disorders of facial nerve: Secondary | ICD-10-CM | POA: Diagnosis not present

## 2024-03-18 DIAGNOSIS — M542 Cervicalgia: Secondary | ICD-10-CM | POA: Diagnosis not present

## 2024-03-18 DIAGNOSIS — G43719 Chronic migraine without aura, intractable, without status migrainosus: Secondary | ICD-10-CM | POA: Diagnosis not present

## 2024-03-25 ENCOUNTER — Telehealth (INDEPENDENT_AMBULATORY_CARE_PROVIDER_SITE_OTHER): Admitting: Neurology

## 2024-03-25 ENCOUNTER — Telehealth: Payer: Self-pay | Admitting: Neurology

## 2024-03-25 ENCOUNTER — Encounter: Payer: Self-pay | Admitting: Neurology

## 2024-03-25 DIAGNOSIS — G4484 Primary exertional headache: Secondary | ICD-10-CM | POA: Diagnosis not present

## 2024-03-25 DIAGNOSIS — M542 Cervicalgia: Secondary | ICD-10-CM | POA: Diagnosis not present

## 2024-03-25 DIAGNOSIS — G8929 Other chronic pain: Secondary | ICD-10-CM

## 2024-03-25 DIAGNOSIS — R29898 Other symptoms and signs involving the musculoskeletal system: Secondary | ICD-10-CM | POA: Diagnosis not present

## 2024-03-25 DIAGNOSIS — G43709 Chronic migraine without aura, not intractable, without status migrainosus: Secondary | ICD-10-CM | POA: Diagnosis not present

## 2024-03-25 DIAGNOSIS — M4722 Other spondylosis with radiculopathy, cervical region: Secondary | ICD-10-CM

## 2024-03-25 DIAGNOSIS — G4486 Cervicogenic headache: Secondary | ICD-10-CM

## 2024-03-25 DIAGNOSIS — R51 Headache with orthostatic component, not elsewhere classified: Secondary | ICD-10-CM | POA: Diagnosis not present

## 2024-03-25 DIAGNOSIS — R519 Headache, unspecified: Secondary | ICD-10-CM

## 2024-03-25 DIAGNOSIS — M7918 Myalgia, other site: Secondary | ICD-10-CM | POA: Diagnosis not present

## 2024-03-25 MED ORDER — QULIPTA 60 MG PO TABS: 60.0000 mg | ORAL_TABLET | Freq: Every day | ORAL | 11 refills | Status: DC

## 2024-03-25 MED ORDER — BACLOFEN 5 MG PO TABS: 5.0000 mg | ORAL_TABLET | Freq: Three times a day (TID) | ORAL | 3 refills | Status: DC | PRN

## 2024-03-25 NOTE — Telephone Encounter (Addendum)
 Referral for sports medicine sent through [facility] to [facility] to see Dr. Arthea Sharps. Phone: [phone], Fax: [phone]

## 2024-03-25 NOTE — Progress Notes (Signed)
 [facility]    Provider:  Dr Blackburn Requesting Provider: Diona Perkins, MD Primary Care Provider:  Diona Perkins, MD  CC:  worsening neck and headache pain  Virtual Visit via Video Note  I connected with Gail Blackburn on 03/25/24 at  2:30 PM EDT by a video enabled telemedicine application and verified that I am speaking with the correct person using two identifiers.  Location: Patient: Home Provider: office   I discussed the limitations of evaluation and management by telemedicine and the availability of in person appointments. The patient expressed understanding and agreed to proceed.    Follow Up Instructions:    I discussed the assessment and treatment plan with the patient. The patient was provided an opportunity to ask questions and all were answered. The patient agreed with the plan and demonstrated an understanding of the instructions.   The patient was advised to call back or seek an in-person evaluation if the symptoms worsen or if the condition fails to improve as anticipated.  I provided 40 minutes of non-face-to-face time during this encounter.   Gail KATHEE Ines, MD   HPI:  Gail Blackburn is a 48 y.o. female here as requested by Diona Perkins, MD for headaches/migraines. Still getting botox  every 3 months, also on ajovy , chronic neck pain, she has radicular symptoms, was getting dry needling (now expensive), gets trigger point injections, on Ajovy . She has been to [facility] for the injections she is still getting 15 headaaches, had imaging cervical spine last in 2022, in the neck, both sides, at the base of the neck, traps hurt, states hurting in the left shoulder blade, radiates to the front of the head. Hurts behind the eyes. She is 108 pounds, not overweight, she is not having vision changes, she has pulsating in the temples in the left not in the ear, 15 headache days this month, worsening in severity and frequency, not convinced is migraines.  Neck pain. Ahse has anxiety which makes it worse. She had a breakdown the other day. She gets her eyes checked every year. The pain is coming out of the neck, very painful, usually happens when active and moving around it starts coming on she starts getting pain in her neck so much tension and stress. She sees a psychiatrist and a therapist. Reports weakness.  Reviewed notes, labs and imaging from outside physicians, which showed: MRI cervical spine:CLINICAL DATA:  Cervical radiculopathy. Numbness and tingling of right hand. Right arm weakness. Chronic neck pain. Neck pain, chronic, degenerative changes on x-ray. Additional history provided by scanning technologist: Patient reports right-sided neck pain radiating into shoulder and arm; intermittent finger tip numbness in right hand, symptoms for 10 months.   EXAM: MRI CERVICAL SPINE WITHOUT CONTRAST   TECHNIQUE: Multiplanar, multisequence MR imaging of the cervical spine was performed. No intravenous contrast was administered.   COMPARISON:  Radiographs of the cervical spine 04/28/2021.   FINDINGS: Alignment: Straightening of the expected cervical lordosis. No significant spondylolisthesis.   Vertebrae: Vertebral body height is maintained. No significant marrow edema or focal suspicious osseous lesion.   Cord: No spinal cord signal abnormality is identified. Mild flattening of the ventral spinal cord at C4-C5, as described below.   Posterior Fossa, vertebral arteries, paraspinal tissues: No abnormality identified within included portions of the posterior fossa. Flow voids preserved within the imaged cervical vertebral arteries. Paraspinal soft tissues unremarkable.   Disc levels:   Mild multilevel disc degeneration, greatest at C6-C7.   C2-C3: Mild uncovertebral hypertrophy on the  left. Mild facet arthrosis on the right. No significant disc herniation or stenosis.   C3-C4: No significant disc herniation or stenosis.   C4-C5:  5 x 3 mm central disc protrusion. The disc protrusion focally effaces the ventral thecal sac, contributing to mild spinal canal stenosis, contacting and minimally flattening the ventral spinal cord. The dorsal CSF space is maintained. No significant foraminal stenosis.   C5-C6: Shallow disc bulge. Superimposed tiny right center disc protrusion. Uncovertebral hypertrophy (greater on the right). No significant spinal canal stenosis. Mild relative right neural foraminal narrowing.   C6-C7: Disc bulge with bilateral uncovertebral hypertrophy. Superimposed small right center/foraminal disc extrusion with slight cranial migration (for instance as seen on series 2, image 7). The disc extrusion effaces the right ventral thecal sac, narrowing the right foraminal entry zone, with potential to affect the exiting right C7 nerve root. Lateral to this, there is moderate right neural foraminal narrowing. Moderate left neural foraminal narrowing.   C7-T1: No significant disc herniation or stenosis.   IMPRESSION: Cervical spondylosis, as outlined and with findings most notably as follows.   At C6-C7, there is mild disc degeneration. Disc bulge with bilateral uncovertebral hypertrophy. Superimposed small right center/foraminal disc extrusion with slight cranial migration. The disc extrusion effaces the right ventral thecal sac, narrowing the right foraminal entry zone, with potential to affect the exiting right C7 nerve root. Lateral to this, there is moderate right neural foraminal narrowing. Moderate left neural foraminal narrowing.   At C5-C6, there is a shallow disc bulge. Superimposed tiny right center disc protrusion. Uncovertebral hypertrophy (greater on the right). No significant spinal canal stenosis. Mild relative right neural foraminal narrowing.   At C4-C5, there is a 5 x 3 mm central disc protrusion. The disc protrusion mildly narrows the spinal canal, contacting and  minimally flattening the ventral spinal cord.   Straightening of the expected cervical lordosis.     Latest Ref Rng & Units 02/21/2023   12:56 AM 02/20/2023    1:34 AM 02/18/2023    5:37 PM  CBC  WBC 4.0 - 10.5 K/uL 5.7  6.9  10.6   Hemoglobin 12.0 - 15.0 g/dL 9.7  9.8  87.6   Hematocrit 36.0 - 46.0 % 29.0  29.6  36.6   Platelets 150 - 400 K/uL 179  165  254       Latest Ref Rng & Units 02/21/2023   12:56 AM 02/20/2023    1:34 AM 02/19/2023    1:53 AM  CMP  Glucose 70 - 99 mg/dL 890  879  891   BUN 6 - 20 mg/dL 6  <5  6   Creatinine 9.55 - 1.00 mg/dL 9.21  9.30  9.22   Sodium 135 - 145 mmol/L 136  139  135   Potassium 3.5 - 5.1 mmol/L 3.5  3.6  3.8   Chloride 98 - 111 mmol/L 105  108  106   CO2 22 - 32 mmol/L 24  26  23    Calcium  8.9 - 10.3 mg/dL 8.0  8.1  8.1   Total Protein 6.5 - 8.1 g/dL   5.3   Total Bilirubin 0.3 - 1.2 mg/dL   1.0   Alkaline Phos 38 - 126 U/L   39   AST 15 - 41 U/L   17   ALT 0 - 44 U/L   15       Review of Systems: Patient complains of symptoms per HPI as well as the following symptoms mood disorder. Pertinent  negatives and positives per HPI. All others negative.   Social History   Socioeconomic History   Marital status: Significant Other    Spouse name: Not on file   Number of children: 2   Years of education: College   Highest education level: Not on file  Occupational History   Occupation: Unemployed  Tobacco Use   Smoking status: Every Day    Types: Cigarettes   Smokeless tobacco: Never   Tobacco comments:    Up to 5 vape cigs per day  Vaping Use   Vaping status: Every Day  Substance and Sexual Activity   Alcohol use: Not Currently    Comment: Occasional, maybe 2 x/yr   Drug use: Not Currently    Comment: Previous addiction to pain meds   Sexual activity: Yes    Birth control/protection: Pill  Other Topics Concern   Not on file  Social History Narrative   Lives at home w/ her children   Right-handed   Caffeine: 1-2 cups per  day   Social Drivers of Health   Financial Resource Strain: Not on file  Food Insecurity: Patient Declined (02/19/2023)   Hunger Vital Sign    Worried About Running Out of Food in the Last Year: Patient declined    Ran Out of Food in the Last Year: Patient declined  Transportation Needs: No Transportation Needs (02/19/2023)   PRAPARE - Administrator, Civil Service (Medical): No    Lack of Transportation (Non-Medical): No  Physical Activity: Not on file  Stress: Not on file  Social Connections: Not on file  Intimate Partner Violence: Unknown (02/19/2023)   Humiliation, Afraid, Rape, and Kick questionnaire    Fear of Current or Ex-Partner: No    Emotionally Abused: No    Physically Abused: Not on file    Sexually Abused: No    Family History  Problem Relation Age of Onset   Bipolar disorder Mother    Alcohol abuse Mother    Hyperlipidemia Mother    Heart disease Maternal Grandmother    Stroke Maternal Grandmother    Leukemia Maternal Grandmother    Hyperlipidemia Maternal Grandmother    Hypertension Maternal Grandmother    Graves' disease Cousin    Alcohol abuse Maternal Aunt    Drug abuse Maternal Aunt    Asthma Maternal Aunt    COPD Maternal Aunt    Bipolar disorder Maternal Aunt    Hyperlipidemia Maternal Aunt     Past Medical History:  Diagnosis Date   Anemia    PRIOR HISTORY   Anxiety    Asthma    Bipolar disorder (HCC)    Borderline   Depression    Fibromyalgia    Genital herpes simplex    Headache    MIGRAINES   Hepatic lesion 2022   interminate   Hypothyroidism 2009   TOOK MEDS FOR FEW WEEKS NO MEDS NOW   Migraine    Pancreatitis    Splenic vein thrombosis    Thyroid disease    treated in past not sure if too high or too low    Patient Active Problem List   Diagnosis Date Noted   Pancreatitis, acute 02/19/2023   Pancreatitis 02/19/2023   Abdominal pain 09/24/2022   Acute pancreatitis 09/24/2022   Neck pain 04/26/2021   Splenic  vein thrombosis    Alcohol use disorder, moderate, dependence (HCC)    Alcohol-induced acute pancreatitis    Alcohol withdrawal syndrome without complication (HCC)    Hyponatremia  Pain of upper abdomen 10/28/2020   Tinea manuum 05/21/2019   Abdominal wall lump 02/21/2018   Chronic fatigue syndrome with fibromyalgia 02/24/2017   Skin lesion of left leg 02/10/2017   History of abnormal cervical Pap smear 02/10/2017   Anxiety 10/15/2014   Depression 10/15/2014   Migraine 10/15/2014   Chronic pain 10/15/2014   Low TSH level 10/15/2014   Tobacco use disorder 10/15/2014    Past Surgical History:  Procedure Laterality Date   COLONOSCOPY     DILATION AND EVACUATION N/A 05/06/2016   Procedure: DILATATION AND EVACUATION;  Surgeon: Nathanel Bunker, MD;  Location: [facility];  Service: Gynecology;  Laterality: N/A;   FOOT SURGERY     TEETH PULLED  03/2016   WISDOM TOOTH EXTRACTION      Current Outpatient Medications  Medication Sig Dispense Refill   Atogepant (QULIPTA) 60 MG TABS Take 1 tablet (60 mg total) by mouth daily. 30 tablet 11   Baclofen 5 MG TABS Take 1 tablet (5 mg total) by mouth 3 (three) times daily as needed. If this makes you tired you can take 5-10mg (1-2 tabs) at bedtime only. Plese do not take with other muscle relaxers as can cause sedation. 90 tablet 3   ALPRAZolam  (XANAX ) 0.5 MG tablet Take 0.5 mg by mouth 4 (four) times daily.     atomoxetine (STRATTERA) 100 MG capsule Take 100 mg by mouth every morning.     botulinum toxin Type A  (BOTOX ) 200 units injection Provider to inject 155 units into the muscles of the head and neck every 12 weeks. Discard remainder. 1 each 3   buPROPion  (WELLBUTRIN  SR) 150 MG 12 hr tablet Take 150 mg by mouth in the morning.     JENCYCLA  0.35 MG tablet Take 1 tablet by mouth daily.     ondansetron  (ZOFRAN -ODT) 4 MG disintegrating tablet Take 1-2 tablets (4-8 mg total) by mouth every 8 (eight) hours as needed. 30 tablet 11   propranolol  ER  (INDERAL  LA) 60 MG 24 hr capsule Take 1 capsule (60 mg total) by mouth daily. 90 capsule 3   REXULTI  2 MG TABS tablet Take 2 mg by mouth daily.     Rimegepant Sulfate  (NURTEC) 75 MG TBDP Take 1 tablet (75 mg total) by mouth daily as needed. For migraines. Take as close to onset of migraine as possible. One daily maximum. 10 tablet 6   rizatriptan  (MAXALT -MLT) 10 MG disintegrating tablet 1 TABLET FOR MIGRAINE. MAY REPEAT IN 2 HOURS. (Patient taking differently: Take 10 mg by mouth as needed for migraine. 1 TABLET FOR MIGRAINE. MAY REPEAT IN 2 HOURS.) 10 tablet 5   venlafaxine  XR (EFFEXOR -XR) 75 MG 24 hr capsule Take 1 capsule (75 mg total) by mouth daily with breakfast. 30 capsule 0   No current facility-administered medications for this visit.    Allergies as of 03/25/2024 - Review Complete 03/25/2024  Allergen Reaction Noted   Latex Rash and Other (See Comments) 05/06/2016   Tape Rash and Other (See Comments) 11/03/2020    Vitals: There were no vitals taken for this visit. Last Weight:  Wt Readings from Last 1 Encounters:  02/19/23 129 lb 13.6 oz (58.9 kg)   Last Height:   Ht Readings from Last 1 Encounters:  02/19/23 5' 5.5 (1.664 m)     Physical exam: Exam: Gen: NAD, conversant      CV: No palpitations or chest pain or SOB. VS: Breathing at a normal rate. Weight appears within normal limits. Not febrile. Eyes:  Conjunctivae clear without exudates or hemorrhage  Neuro: Detailed Neurologic Exam  Speech:    Speech is normal; fluent and spontaneous with normal comprehension.  Cognition:    The patient is oriented to person, place, and time;     recent and remote memory intact;     language fluent;     normal attention, concentration, fund of knowledge Cranial Nerves:    The pupils are equal, round, and reactive to light. Visual fields are full Extraocular movements are intact.  The face is symmetric with normal sensation. The palate elevates in the midline. Hearing intact.  Voice is normal. Shoulder shrug is normal. The tongue has normal motion without fasciculations.   Coordination: normal  Gait:    No abnormalities noted or reported  Motor Observation:   no involuntary movements noted. Tone:    Appears normal  Posture:    Posture is normal. normal erect    Strength:    Strength is anti-gravity and symmetric in the upper and lower limbs.      Sensation: intact to LT, no reports of numbness or tingling or paresthesias        Assessment/Plan:  Patient with chronic worsening headaches and neck pain, 15 total headache days a month of which 10 moderate to severe and worsening migraines.  Chronic neck pain and myofascial syndrome: She has had dry needling, has seen neurosurgery, also gets dry needling, neck pain triggers migraines, is on Ajovy  and gets botox  every 3 months. Tension in the neck is her biggest complaint, send to Zach Smith at sports medicine to see if can help with the cervical myofascial pain component.  MRi cervical spine to evaluate for progression of her disc disease and facet hypertrophy with possibly C7 nerve impingement on mri cervical spine 2022 and also given weakness and radiation to the traps and shoulder blades.  Chronic migraines: Stop Ajovy , start qulipta; Botox  improves migraines with > 50% freq and severity, continue botox  Worsening headaches, could be cervicogenic headache but given positional and exertional qualities need MRI brain as below Chronic migraines. Tried and failed a plethora of medications. Tried emgality , ajovy , flexeril , cymbalta, propranolol  iR/ER, topiramate, gabapentin , lamictal, robaxin , medrol  dosepak, reglan , botox  fo rmigraines, imitrex, maxalt , nurtec, effexor , nortriptyline, tizanidine  MRI brain and cervical spine: Chronic headaches, worsening vision changes, positional and exertional (worse when standing and moving), worsening headaches: MRI brain due to concerning symptoms of positional and exertional  headaches,vision changes, worsening headaches  to look for space occupying mass, chiari or intracranial hypertension (pseudotumor), strokes, malignancies, vasculidities, demyelination(multiple sclerosis) or other Muscle spasms: failed multiple muscle relaxers,try baclofen tid and if it makes her tired then can take it at night time. Stop any other muscle relaxer.      Orders Placed This Encounter  Procedures   MR CERVICAL SPINE WO CONTRAST   MR BRAIN W WO CONTRAST   AMB referral to sports medicine   Meds ordered this encounter  Medications   Baclofen 5 MG TABS    Sig: Take 1 tablet (5 mg total) by mouth 3 (three) times daily as needed. If this makes you tired you can take 5-10mg (1-2 tabs) at bedtime only. Plese do not take with other muscle relaxers as can cause sedation.    Dispense:  90 tablet    Refill:  3   Atogepant (QULIPTA) 60 MG TABS    Sig: Take 1 tablet (60 mg total) by mouth daily.    Dispense:  30 tablet    Refill:  11  Cc: Diona Perkins, MD,  Diona Perkins, MD  Gail Epp, MD  [facility] [address] [place], [place] [address]  Phone [phone] Fax [phone]

## 2024-03-26 ENCOUNTER — Encounter: Payer: Self-pay | Admitting: Neurology

## 2024-03-26 DIAGNOSIS — M7918 Myalgia, other site: Secondary | ICD-10-CM

## 2024-03-26 DIAGNOSIS — G43709 Chronic migraine without aura, not intractable, without status migrainosus: Secondary | ICD-10-CM

## 2024-03-26 MED ORDER — BACLOFEN 5 MG PO TABS: 5.0000 mg | ORAL_TABLET | Freq: Three times a day (TID) | ORAL | 3 refills | Status: AC | PRN

## 2024-03-26 MED ORDER — QULIPTA 60 MG PO TABS: 60.0000 mg | ORAL_TABLET | Freq: Every day | ORAL | 11 refills | Status: AC

## 2024-03-27 ENCOUNTER — Ambulatory Visit: Attending: Neurology | Admitting: Physical Therapy

## 2024-04-01 ENCOUNTER — Telehealth: Payer: Self-pay

## 2024-04-01 ENCOUNTER — Other Ambulatory Visit (HOSPITAL_COMMUNITY): Payer: Self-pay

## 2024-04-01 NOTE — Telephone Encounter (Signed)
 Pharmacy Patient Advocate Encounter  Received notification from [facility] that Prior Authorization for Qulipta  60MG  tablets has been APPROVED from 04/01/2024 to 06/30/2024   PA #/Case ID/Reference #: PA Case ID #: [id number]

## 2024-04-01 NOTE — Telephone Encounter (Signed)
 Pharmacy Patient Advocate Encounter   Received notification from Fax that prior authorization for Qulipta  60MG  tablets is required/requested.   Insurance verification completed.   The patient is insured through [facility] .   Per test claim: PA required; PA submitted to above mentioned insurance via CoverMyMeds Key/confirmation #/EOC [id number] Status is pending

## 2024-04-04 ENCOUNTER — Encounter: Admitting: Family Medicine

## 2024-04-05 DIAGNOSIS — F331 Major depressive disorder, recurrent, moderate: Secondary | ICD-10-CM | POA: Diagnosis not present

## 2024-04-05 DIAGNOSIS — F411 Generalized anxiety disorder: Secondary | ICD-10-CM | POA: Diagnosis not present

## 2024-04-08 ENCOUNTER — Telehealth: Payer: Self-pay | Admitting: Neurology

## 2024-04-08 NOTE — Telephone Encounter (Signed)
 [employer] [place]: [phone] exp. 03/27/24-05/25/24 sent to GI [phone]

## 2024-04-18 ENCOUNTER — Ambulatory Visit
Admission: RE | Admit: 2024-04-18 | Discharge: 2024-04-18 | Disposition: A | Discharge status: Home / Self Care | Source: Ambulatory Visit | Attending: Neurology

## 2024-04-18 ENCOUNTER — Ambulatory Visit
Admission: RE | Admit: 2024-04-18 | Discharge: 2024-04-18 | Disposition: A | Discharge status: Home / Self Care | Source: Ambulatory Visit | Attending: Neurology | Admitting: Neurology

## 2024-04-18 DIAGNOSIS — M542 Cervicalgia: Secondary | ICD-10-CM | POA: Diagnosis not present

## 2024-04-18 DIAGNOSIS — R29898 Other symptoms and signs involving the musculoskeletal system: Secondary | ICD-10-CM | POA: Diagnosis not present

## 2024-04-18 DIAGNOSIS — R51 Headache with orthostatic component, not elsewhere classified: Secondary | ICD-10-CM

## 2024-04-18 DIAGNOSIS — R519 Headache, unspecified: Secondary | ICD-10-CM | POA: Diagnosis not present

## 2024-04-18 DIAGNOSIS — M4722 Other spondylosis with radiculopathy, cervical region: Secondary | ICD-10-CM | POA: Diagnosis not present

## 2024-04-18 DIAGNOSIS — G4484 Primary exertional headache: Secondary | ICD-10-CM | POA: Diagnosis not present

## 2024-04-18 DIAGNOSIS — G8929 Other chronic pain: Secondary | ICD-10-CM

## 2024-04-18 MED ORDER — GADOPICLENOL 0.5 MMOL/ML IV SOLN
6.0000 mL | Freq: Once | INTRAVENOUS | Status: AC | PRN
  Administered 2024-04-18: 6 mL via INTRAVENOUS

## 2024-04-18 NOTE — Progress Notes (Unsigned)
 [facility] [address] [place] [address] Phone: [phone] Subjective:   Gail Blackburn, am serving as a scribe for Dr. Arthea Claudene.  I'm seeing this patient by the request  of: ahern MD  CC: neck pain   YEP:Dlagzrupcz  Gail Blackburn is a 48 y.o. female coming in with complaint of chronic cervical spine pain. Patient states that since 2018 she has had pain. Does get migraines. L scapula is painful more than right. Has done PT, chiro and NSAIDs.     MRI of the neck shows -no herniated disc  Calcium  8.1 Hgb 9.7   Past Medical History:  Diagnosis Date   Anemia    PRIOR HISTORY   Anxiety    Asthma    Bipolar disorder (HCC)    Borderline   Depression    Fibromyalgia    Genital herpes simplex    Headache    MIGRAINES   Hepatic lesion 2022   interminate   Hypothyroidism 2009   TOOK MEDS FOR FEW WEEKS NO MEDS NOW   Migraine    Pancreatitis    Splenic vein thrombosis    Thyroid disease    treated in past not sure if too high or too low   Past Surgical History:  Procedure Laterality Date   COLONOSCOPY     DILATION AND EVACUATION N/A 05/06/2016   Procedure: DILATATION AND EVACUATION;  Surgeon: Nathanel Bunker, MD;  Location: [facility];  Service: Gynecology;  Laterality: N/A;   FOOT SURGERY     TEETH PULLED  03/2016   WISDOM TOOTH EXTRACTION     Social History   Socioeconomic History   Marital status: Significant Other    Spouse name: Not on file   Number of children: 2   Years of education: College   Highest education level: Not on file  Occupational History   Occupation: Unemployed  Tobacco Use   Smoking status: Every Day    Types: Cigarettes   Smokeless tobacco: Never   Tobacco comments:    Up to 5 vape cigs per day  Vaping Use   Vaping status: Every Day  Substance and Sexual Activity   Alcohol use: Not Currently    Comment: Occasional, maybe 2 x/yr   Drug use: Not Currently    Comment: Previous addiction to  pain meds   Sexual activity: Yes    Birth control/protection: Pill  Other Topics Concern   Not on file  Social History Narrative   Lives at home w/ her children   Right-handed   Caffeine: 1-2 cups per day   Social Drivers of Health   Financial Resource Strain: Not on file  Food Insecurity: Patient Declined (02/19/2023)   Hunger Vital Sign    Worried About Running Out of Food in the Last Year: Patient declined    Ran Out of Food in the Last Year: Patient declined  Transportation Needs: No Transportation Needs (02/19/2023)   PRAPARE - Administrator, Civil Service (Medical): No    Lack of Transportation (Non-Medical): No  Physical Activity: Not on file  Stress: Not on file  Social Connections: Not on file   Allergies  Allergen Reactions   Latex Rash and Other (See Comments)    Only in vaginal area   Tape Rash and Other (See Comments)    Rashes occur only if left on for a long time   Family History  Problem Relation Age of Onset   Bipolar disorder Mother  Alcohol abuse Mother    Hyperlipidemia Mother    Heart disease Maternal Grandmother    Stroke Maternal Grandmother    Leukemia Maternal Grandmother    Hyperlipidemia Maternal Grandmother    Hypertension Maternal Grandmother    Graves' disease Cousin    Alcohol abuse Maternal Aunt    Drug abuse Maternal Aunt    Asthma Maternal Aunt    COPD Maternal Aunt    Bipolar disorder Maternal Aunt    Hyperlipidemia Maternal Aunt     Current Outpatient Medications (Endocrine & Metabolic):    JENCYCLA  0.35 MG tablet, Take 1 tablet by mouth daily.  Current Outpatient Medications (Cardiovascular):    propranolol  ER (INDERAL  LA) 60 MG 24 hr capsule, Take 1 capsule (60 mg total) by mouth daily.   Current Outpatient Medications (Analgesics):    Atogepant  (QULIPTA ) 60 MG TABS, Take 1 tablet (60 mg total) by mouth daily.   Rimegepant Sulfate  (NURTEC) 75 MG TBDP, Take 1 tablet (75 mg total) by mouth daily as needed. For  migraines. Take as close to onset of migraine as possible. One daily maximum.   rizatriptan  (MAXALT -MLT) 10 MG disintegrating tablet, 1 TABLET FOR MIGRAINE. MAY REPEAT IN 2 HOURS. (Patient taking differently: Take 10 mg by mouth as needed for migraine. 1 TABLET FOR MIGRAINE. MAY REPEAT IN 2 HOURS.)   Current Outpatient Medications (Other):    ALPRAZolam  (XANAX ) 0.5 MG tablet, Take 0.5 mg by mouth 4 (four) times daily.   atomoxetine (STRATTERA) 100 MG capsule, Take 100 mg by mouth every morning.   Baclofen  5 MG TABS, Take 1 tablet (5 mg total) by mouth 3 (three) times daily as needed. If this makes you tired you can take 5-10mg (1-2 tabs) at bedtime only. Plese do not take with other muscle relaxers as can cause sedation.   botulinum toxin Type A  (BOTOX ) 200 units injection, Provider to inject 155 units into the muscles of the head and neck every 12 weeks. Discard remainder.   buPROPion  (WELLBUTRIN  SR) 150 MG 12 hr tablet, Take 150 mg by mouth in the morning.   ondansetron  (ZOFRAN -ODT) 4 MG disintegrating tablet, Take 1-2 tablets (4-8 mg total) by mouth every 8 (eight) hours as needed.   REXULTI  2 MG TABS tablet, Take 2 mg by mouth daily.   venlafaxine  XR (EFFEXOR -XR) 75 MG 24 hr capsule, Take 1 capsule (75 mg total) by mouth daily with breakfast.   Reviewed prior external information including notes and imaging from  primary care provider As well as notes that were available from care everywhere and other healthcare systems.  Past medical history, social, surgical and family history all reviewed in electronic medical record.  No pertanent information unless stated regarding to the chief complaint.   Review of Systems:  No headache, visual changes, nausea, vomiting, diarrhea, constipation, dizziness, abdominal pain, skin rash, fevers, chills, night sweats, weight loss, swollen lymph nodes, body aches, joint swelling, chest pain, shortness of breath, mood changes. POSITIVE muscle  aches  Objective  There were no vitals taken for this visit.   General: No apparent distress alert and oriented x3 mood and affect normal, dressed appropriately.  HEENT: Pupils equal, extraocular movements intact  Respiratory: Patient's speak in full sentences and does not appear short of breath  Cardiovascular: No lower extremity edema, non tender, no erythema  Neck exam show significant loss of lordosis.  Scapular dyskinesis left greater than right.  Significant tightness of the levator scapula on the left side.  Does have significant limitation of  extension of the neck noted.   Osteopathic findings C4 flexed rotated and side bent left C6 flexed rotated and side bent left T3 extended rotated and side bent left inhaled third rib T9 extended rotated and side bent left inhaled rib     Impression and Recommendations:  Degenerative disc disease, cervical Degenerative disc disease noted.  Discussed icing regimen and home exercises, discussed with patient about different treatment options with this being sotalol.  Discussed the potential for an epidural at the C7-T1 area for diagnostic and therapeutic purposes.  We discussed also the possibility of different surgical intervention but hopefully that is not going to be necessary.  Attempted osteopathic manipulation as well.  Discussed icing regimen and home exercises, increase activity slowly.  Follow-up with me again in 6 to 8 weeks otherwise.    Decision today to treat with OMT was based on Physical Exam  After verbal consent patient was treated with HVLA, ME, FPR techniques in cervical, thoracic, rib areas, all areas are chronic   Patient tolerated the procedure well with improvement in symptoms  Patient given exercises, stretches and lifestyle modifications  See medications in patient instructions if given  Patient will follow up in 4-8 weeks   The above documentation has been reviewed and is accurate and complete Elesia Pemberton M Jose Alleyne,  DO

## 2024-04-19 ENCOUNTER — Encounter: Payer: Self-pay | Admitting: Family Medicine

## 2024-04-19 ENCOUNTER — Ambulatory Visit: Admitting: Family Medicine

## 2024-04-19 VITALS — BP 102/76 | HR 90 | Ht 65.5 in | Wt 112.0 lb

## 2024-04-19 DIAGNOSIS — M9902 Segmental and somatic dysfunction of thoracic region: Secondary | ICD-10-CM | POA: Diagnosis not present

## 2024-04-19 DIAGNOSIS — M9901 Segmental and somatic dysfunction of cervical region: Secondary | ICD-10-CM

## 2024-04-19 DIAGNOSIS — M9908 Segmental and somatic dysfunction of rib cage: Secondary | ICD-10-CM

## 2024-04-19 DIAGNOSIS — M542 Cervicalgia: Secondary | ICD-10-CM

## 2024-04-19 DIAGNOSIS — M5030 Other cervical disc degeneration, unspecified cervical region: Secondary | ICD-10-CM | POA: Diagnosis not present

## 2024-04-19 NOTE — Patient Instructions (Addendum)
 [facility] [phone]  Do prescribed exercises at least 3x a week  Tart Cherry 2400mg  Vit D 2000iu See you again in 2 months

## 2024-04-19 NOTE — Assessment & Plan Note (Signed)
 Degenerative disc disease noted.  Discussed icing regimen and home exercises, discussed with patient about different treatment options with this being sotalol.  Discussed the potential for an epidural at the C7-T1 area for diagnostic and therapeutic purposes.  We discussed also the possibility of different surgical intervention but hopefully that is not going to be necessary.  Attempted osteopathic manipulation as well.  Discussed icing regimen and home exercises, increase activity slowly.  Follow-up with me again in 6 to 8 weeks otherwise.

## 2024-04-22 ENCOUNTER — Ambulatory Visit: Payer: Self-pay | Admitting: Neurology

## 2024-04-22 NOTE — Progress Notes (Signed)
(  Can you call and see if she wants an emg/ncs or a referral to a different neurosurgeon for her arm pain?) Gail Blackburn, there has been no progression on your MRI cervical spine. I can send you to another neurosurgeon for a second opinion on whether you have nerve impingement/pinched nerve. I also recommend we perform an emg/ncs which is a test I can perform to measure your nerves and see ifyou have a pinched nerve, I'll ask my staff to call you thanks

## 2024-04-25 ENCOUNTER — Encounter: Payer: Self-pay | Admitting: Family Medicine

## 2024-04-26 ENCOUNTER — Encounter: Payer: Self-pay | Admitting: Neurology

## 2024-04-29 DIAGNOSIS — G518 Other disorders of facial nerve: Secondary | ICD-10-CM | POA: Diagnosis not present

## 2024-04-29 DIAGNOSIS — M791 Myalgia, unspecified site: Secondary | ICD-10-CM | POA: Diagnosis not present

## 2024-04-29 DIAGNOSIS — G43719 Chronic migraine without aura, intractable, without status migrainosus: Secondary | ICD-10-CM | POA: Diagnosis not present

## 2024-04-29 DIAGNOSIS — M542 Cervicalgia: Secondary | ICD-10-CM | POA: Diagnosis not present

## 2024-05-01 ENCOUNTER — Ambulatory Visit: Admitting: Physical Therapy

## 2024-05-01 ENCOUNTER — Other Ambulatory Visit

## 2024-05-03 NOTE — Discharge Instructions (Signed)

## 2024-05-06 ENCOUNTER — Ambulatory Visit
Admission: RE | Admit: 2024-05-06 | Discharge: 2024-05-06 | Disposition: A | Discharge status: Home / Self Care | Source: Ambulatory Visit | Attending: Family Medicine | Admitting: Family Medicine

## 2024-05-06 DIAGNOSIS — M47812 Spondylosis without myelopathy or radiculopathy, cervical region: Secondary | ICD-10-CM | POA: Diagnosis not present

## 2024-05-06 DIAGNOSIS — M542 Cervicalgia: Secondary | ICD-10-CM

## 2024-05-06 MED ORDER — IOPAMIDOL (ISOVUE-M 300) INJECTION 61%
1.0000 mL | Freq: Once | INTRAMUSCULAR | Status: AC | PRN
  Administered 2024-05-06 (×2): 1 mL via EPIDURAL

## 2024-05-06 MED ORDER — TRIAMCINOLONE ACETONIDE 40 MG/ML IJ SUSP (RADIOLOGY)
60.0000 mg | Freq: Once | INTRAMUSCULAR | Status: AC
  Administered 2024-05-06 (×2): 60 mg via EPIDURAL

## 2024-05-15 DIAGNOSIS — Z5181 Encounter for therapeutic drug level monitoring: Secondary | ICD-10-CM | POA: Diagnosis not present

## 2024-05-15 DIAGNOSIS — F3181 Bipolar II disorder: Secondary | ICD-10-CM | POA: Diagnosis not present

## 2024-05-15 DIAGNOSIS — F902 Attention-deficit hyperactivity disorder, combined type: Secondary | ICD-10-CM | POA: Diagnosis not present

## 2024-05-21 ENCOUNTER — Ambulatory Visit (INDEPENDENT_AMBULATORY_CARE_PROVIDER_SITE_OTHER): Admitting: Licensed Clinical Social Worker

## 2024-05-21 DIAGNOSIS — F1020 Alcohol dependence, uncomplicated: Secondary | ICD-10-CM

## 2024-05-21 DIAGNOSIS — F332 Major depressive disorder, recurrent severe without psychotic features: Secondary | ICD-10-CM | POA: Diagnosis not present

## 2024-05-21 DIAGNOSIS — F411 Generalized anxiety disorder: Secondary | ICD-10-CM | POA: Diagnosis not present

## 2024-05-21 DIAGNOSIS — F331 Major depressive disorder, recurrent, moderate: Secondary | ICD-10-CM | POA: Insufficient documentation

## 2024-05-21 NOTE — Progress Notes (Signed)
 [facility] Counselor/Therapist Progress Note  Patient ID: Gail Blackburn, MRN: [id number]    Date: 05/21/24  Time Spent: 0805  am - 0901 am : 56 Minutes  Treatment Type: Initial Assessment and Treatment Planning  Presenting Problem Chief Complaint: Patient reports that she lost her partner of 23 years in 2024 from an overdose and then father past a month later. Patient reports an obsession with money, and she self medicates with alcohol. Recently diagnosed with ADHD. Patient reports doing therapy previously.   What are the main stressors in your life right now, how long? Depression  3, Anxiety   3, Mood Swings  3, Appetite Change   3, Sleep Changes   3, Racing Thoughts   3, Confusion   3, Memory Problems   3, Loss of Interest   3, Irritability   3, Excessive Worrying   3, Suicidal Thoughts   1, Low Energy   3, Panic Attacks   3, Obsessive Thoughts   3, Poor Concentration   3, and Hyperactivity   3   Previous mental health services Have you ever been treated for a mental health problem, when, where, by whom? Yes  Previous therapy and mental health treatment via psychiatrist- Dr. Jess, several previous therapist.   Are you currently seeing a therapist or counselor, counselor's name? No   Have you ever had a mental health hospitalization, how many times, length of stay? No NA  Have you ever been treated with medication, name, reason, response? Yes, Adderall, Wellbutrin , Effexor , Rexalti, Patient reports that she doesn't feel that the medications are not very helpful.   Have you ever had suicidal thoughts or attempted suicide, when, how? Yes Patient reports that she has thoughts but was drinking. She denied current thoughts.  Risk factors for Suicide Demographic factors:  Divorced or widowed, Caucasian, and Unemployed Current mental status: No plan to harm self or others Loss factors: Decrease in vocational status and Loss of significant relationship Historical factors:  Impulsivity Risk Reduction factors: Living with another person, especially a relative Clinical factors:  Severe Anxiety and/or Agitation Depression:   Impulsivity Insomnia Severe Alcohol/Substance Abuse/Dependencies Chronic Pain More than one psychiatric diagnosis Unstable or Poor Therapeutic Relationship Cognitive features that contribute to risk: NA    SUICIDE RISK:  Minimal: No identifiable suicidal ideation.  Patients presenting with no risk factors but with morbid ruminations; may be classified as minimal risk based on the severity of the depressive symptoms  Medical history Medical treatment and/or problems, explain: Yes  enlafaxine XR (EFFEXOR -XR) 75 MG 24 hr capsule 75 mg, Daily with breakfast          rizatriptan  (MAXALT -MLT) 10 MG disintegrating tablet       Prescribed: 1 TABLET FOR MIGRAINE. MAY REPEAT IN 2 HOURS. Patient taking differently: 10 mg Oral As needed, migraine, 1 TABLET FOR MIGRAINE. MAY REPEAT IN 2 HOURS., Reason: Free Text Sig Edit, Informant: Self, Reported on 04/19/2024   Rimegepant Sulfate  (NURTEC) 75 MG TBDP 75 mg, Daily PRN         REXULTI  2 MG TABS tablet 2 mg, Daily         propranolol  ER (INDERAL  LA) 60 MG 24 hr capsule 60 mg, Daily         ondansetron  (ZOFRAN -ODT) 4 MG disintegrating tablet 4-8 mg, Every 8 hours PRN         JENCYCLA  0.35 MG tablet 1 tablet, Daily         buPROPion  (WELLBUTRIN  SR) 150 MG 12  hr tablet 150 mg, Every morning         botulinum toxin Type A  (BOTOX ) 200 units injection          Baclofen  5 MG TABS 5 mg, 3 times daily PRN         atomoxetine (STRATTERA) 100 MG capsule 100 mg, Every morning         Atogepant  (QULIPTA ) 60 MG TABS 60 mg, Daily         ALPRAZolam  (XANAX ) 0.5 MG tablet 0.5 mg, 4 times daily         Do you have any issues with chronic pain?  Yes shoulders, neck, migraines, lower back  Name of primary care physician/last physical exam: [facility]  Allergies: No Medication,  reactions? NA   Current medications:  venlafaxine  XR (EFFEXOR -XR) 75 MG 24 hr capsule 75 mg, Daily with breakfast Taking Taking Differently Not Taking Unknown           rizatriptan  (MAXALT -MLT) 10 MG disintegrating tablet  Taking as Prescribed Taking Differently Not Taking Unknown       Prescribed: 1 TABLET FOR MIGRAINE. MAY REPEAT IN 2 HOURS. Patient taking differently: 10 mg Oral As needed, migraine, 1 TABLET FOR MIGRAINE. MAY REPEAT IN 2 HOURS., Reason: Free Text Sig Edit, Informant: Self, Reported on 04/19/2024   Rimegepant Sulfate  (NURTEC) 75 MG TBDP 75 mg, Daily PRN Taking as Needed Taking Differently Not Taking Unknown          REXULTI  2 MG TABS tablet 2 mg, Daily Taking Taking Differently Not Taking Unknown          propranolol  ER (INDERAL  LA) 60 MG 24 hr capsule 60 mg, Daily Taking Taking Differently Not Taking Unknown          ondansetron  (ZOFRAN -ODT) 4 MG disintegrating tablet 4-8 mg, Every 8 hours PRN Taking as Needed Taking Differently Not Taking Unknown          JENCYCLA  0.35 MG tablet 1 tablet, Daily Taking Taking Differently Not Taking Unknown          buPROPion  (WELLBUTRIN  SR) 150 MG 12 hr tablet 150 mg, Every morning Taking Taking Differently Not Taking Unknown          botulinum toxin Type A  (BOTOX ) 200 units injection  Taking Taking Differently Not Taking Unknown          Baclofen  5 MG TABS 5 mg, 3 times daily PRN Taking as Needed Taking Differently Not Taking Unknown          atomoxetine (STRATTERA) 100 MG capsule 100 mg, Every morning Taking Taking Differently Not Taking Unknown          Atogepant  (QULIPTA ) 60 MG TABS 60 mg, Daily Taking Taking Differently Not Taking Unknown          ALPRAZolam  (XANAX ) 0.5 MG tablet 0.5 mg, 4 times daily         Prescribed by: PCP and Dr. Jess  Is there any history of mental health problems or substance abuse in your family, whom? Yes Mother and aunts have alcohol, gambling addiction, cocaine  abuse, opioid abuse by various family members.  Has anyone in your family been hospitalized, who, where, length of stay? Yes, 2 Aunts-[facility] for drinking, they only stayed a couple of days.   Social/family history Have you been married, how many times?  0  Do you have children?  2  How many pregnancies have you had?  3  Who lives in your  current household? Patient and her mother  Military history: No NA  Religious/spiritual involvement: NA What religion/faith base are you? Believe in God  Family of origin (childhood history)  Parents and patient  Where were you born? [place] Where did you grow up? [place]  How many different homes have you lived? 7 Describe the atmosphere of the household where you grew up: Stable, but mom had shopping issue and she hid it from dad. Mom was her friend.  Do you have siblings, step/half siblings, list names, relation, sex, age? No NA  Are your parents separated/divorced, when and why? No Parents were married 38 years before father passed  Are your parents alive? No Mother alive Father passed in 2024.  Social supports (personal and professional): Mother  Education How many grades have you completed? some college Did you have any problems in school, what type? Yes Behavioral Medications prescribed for these problems? 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 She is currently on disability. Patient reports that there are financial issues due to the shopping and alcohol and THC.   Legal history: Dui in 2019   Trauma/Abuse history: Have you ever been exposed to any form of abuse, what type? Yes, emotional  Have you ever been exposed to something traumatic, describe? Yes Death of long term boyfriend, he overdosed in her bed.  Substance use Do you use Caffeine? Yes Type, frequency? Small coke, 1 cup of coffee in the morning  Do you use Nicotine ?  Yes Type, frequency, ppd? 5 cigarettes daily   Do you use Alcohol? Yes Type, frequency? [place]-5%, 4-5 of the double cans. She reports that she doesn't drink after 6 pm.  How old were you went you first tasted alcohol? 14 Was this accepted by your family? No, they didn't know  When was your last drink, type, how much? Currently drinking while on phone with Clinician.   Have you ever used illicit drugs or taken more than prescribed, type, frequency, date of last usage? Yes Opioids, cocaine, THC  Mental Status: General Appearance Siegfried:  Casual Eye Contact:  Fair Motor Behavior:  Normal Speech:  Normal Level of Consciousness:  Alert Mood:  Anxious Affect:  Appropriate Anxiety Level:  Moderate Thought Process:  Disorganized Thought Content:  WNL Perception:  Normal Judgment:  Poor Insight:  Absent Cognition:  Orientation time, place, and person  Diagnosis AXIS I Alcohol Dependence, Major Depressive Disorder, recurrent severe, Generalized Anxiety Disorder  AXIS II Deferred  AXIS III   AXIS IV occupational problems, other psychosocial or environmental problems, problems related to social environment, and problems with primary support group  AXIS V 51-60 moderate symptoms    Subjective:   Rosina Potters participated from home, via video. Patient is aware of risk and limitations and consented to treatment. Therapist participated from office. We met via phone due to connection issues on side of patients.  We met virtually due to patient request.   Interventions: Cognitive Behavioral Therapy, Dialectical Behavioral Therapy, Assertiveness/Communication, Motivational Interviewing, Solution-Oriented/Positive Psychology, and Grief Therapy  Diagnosis: Major Depressive Disorder   Damien Junk MSW, LCSW/DATE 05/21/2024

## 2024-05-29 ENCOUNTER — Ambulatory Visit: Admitting: Physical Therapy

## 2024-05-30 DIAGNOSIS — F902 Attention-deficit hyperactivity disorder, combined type: Secondary | ICD-10-CM | POA: Diagnosis not present

## 2024-05-30 DIAGNOSIS — F3181 Bipolar II disorder: Secondary | ICD-10-CM | POA: Diagnosis not present

## 2024-05-31 ENCOUNTER — Ambulatory Visit: Admitting: Family Medicine

## 2024-05-31 ENCOUNTER — Encounter: Payer: Self-pay | Admitting: Family Medicine

## 2024-05-31 VITALS — BP 96/77 | HR 89 | Ht 65.0 in | Wt 109.1 lb

## 2024-05-31 DIAGNOSIS — Z23 Encounter for immunization: Secondary | ICD-10-CM | POA: Diagnosis not present

## 2024-05-31 DIAGNOSIS — R053 Chronic cough: Secondary | ICD-10-CM

## 2024-05-31 DIAGNOSIS — F331 Major depressive disorder, recurrent, moderate: Secondary | ICD-10-CM

## 2024-05-31 DIAGNOSIS — F17200 Nicotine dependence, unspecified, uncomplicated: Secondary | ICD-10-CM | POA: Diagnosis not present

## 2024-05-31 DIAGNOSIS — H9193 Unspecified hearing loss, bilateral: Secondary | ICD-10-CM | POA: Diagnosis not present

## 2024-05-31 MED ORDER — FLUTICASONE PROPIONATE 50 MCG/ACT NA SUSP: 2.0000 | Freq: Every day | NASAL | 6 refills | Status: AC

## 2024-05-31 MED ORDER — ALBUTEROL SULFATE HFA 108 (90 BASE) MCG/ACT IN AERS: 2.0000 | INHALATION_SPRAY | Freq: Four times a day (QID) | RESPIRATORY_TRACT | 2 refills | Status: AC | PRN

## 2024-05-31 NOTE — Assessment & Plan Note (Signed)
 Suspect possible chronic obstructive changes from long term tobacco use.  - Discussed PFTs and smoking cessation support with Dr Koval - referral placed and appt made  - Albuterol  prn

## 2024-05-31 NOTE — Assessment & Plan Note (Signed)
 Inquired about patient's PHQ-9 responses. Patient endorses passive SI, no active SI or HI. Cites family members as motivating factors. Follows with therapy and psychiatry.

## 2024-05-31 NOTE — Progress Notes (Addendum)
    SUBJECTIVE:   CHIEF COMPLAINT / HPI:   Chronic cough Reports ongoing cough, and feeling of needing to cough. Used albuterol  in the past. Has smoked for many years, currently uses 5-6 cigarettes per day in addition to inhaled marijuana products. Is interested in quitting smoking.   Hearing reduction Describes recent reduction in bilateral hearing. Is able to hear from both ears, but feels like they are clogged/underwater. Has been blowing nose a lot lately. No recent fever. No ear pain.   PERTINENT  PMH / PSH: Tobacco use, SUD, AUD  OBJECTIVE:   BP 96/77   Pulse 89   Ht 5' 5 (1.651 m)   Wt 109 lb 2 oz (49.5 kg)   SpO2 100%   BMI 18.16 kg/m   General: Well-appearing. Resting comfortably in room. HEENT: MMM. Non-erythematous, non-bulging TM bilaterally.  CV: Normal S1/S2. No extra heart sounds. Warm and well-perfused. Pulm: Breathing comfortably on room air. Bibasilar end-inspiratory wheeze. No increased WOB. Abd: Soft, non-tender, non-distended. Skin:  Warm, dry.    ASSESSMENT/PLAN:   Assessment & Plan Chronic cough Tobacco use disorder Suspect possible chronic obstructive changes from long term tobacco use.  - Discussed PFTs and smoking cessation support with Dr Koval - referral placed and appt made  - Albuterol  prn  Hearing decreased, bilateral Suspect component of congestion contributing to decreased hearing given reported rhinorrhea. May consider further workup if not improving with improvement of congestion.  - Discussed and prescribed Flonase   Encounter for immunization Received annual flu vaccine today.  Major depressive disorder, recurrent episode, moderate (HCC) Inquired about patient's PHQ-9 responses. Patient endorses passive SI, no active SI or HI. Cites family members as motivating factors. Follows with therapy and psychiatry.      RTC in 1 month for healthcare maintenance.   Damien Cassis, MD [facility]

## 2024-05-31 NOTE — Patient Instructions (Addendum)
 Thank you for visiting clinic today and allowing us  to participate in your care!  You are scheduled to see our clinical pharmacist next week for lung function testing and resources for quitting smoking.  06/04/2024  1:30 PM Koval, Peter G, RPH-CPP [facility]   Please use Albuterol  as needed for wheezing. Please use Flonase  daily to help with congestion.   Please schedule an appointment in 1 month for routine healthcare maintenance.   Reach out any time with any questions or concerns you may have - we are here for you!  Damien Cassis, MD [facility] [phone]

## 2024-06-03 ENCOUNTER — Ambulatory Visit (INDEPENDENT_AMBULATORY_CARE_PROVIDER_SITE_OTHER): Admitting: Neurology

## 2024-06-03 VITALS — BP 103/75 | HR 81

## 2024-06-03 DIAGNOSIS — G43709 Chronic migraine without aura, not intractable, without status migrainosus: Secondary | ICD-10-CM

## 2024-06-03 MED ORDER — ONABOTULINUMTOXINA 200 UNITS IJ SOLR
155.0000 [IU] | Freq: Once | INTRAMUSCULAR | Status: AC
  Administered 2024-06-03: 155 [IU] via INTRAMUSCULAR

## 2024-06-03 NOTE — Progress Notes (Signed)
 Botox - 200 units x 1 vial Lot: [id number] Expiration: 11/2026 NDC: 9976-6078-97   Bacteriostatic 0.9% Sodium Chloride - 4 mL  Lot: [id number] Expiration: 07/26/2025 NDC: 9500-8033-97   Dx: H56.288  S/P  Witnessed by Heather PEAK

## 2024-06-03 NOTE — Progress Notes (Signed)
 06/03/2024: Stable  03/05/2024: Botox  has given her greater than 50% improvement in migraine frequency and severity. Is on Ajovy  too; On ajovy  and botox . Getting dry needling helping taylor at [facility] next door goes just for dryneedling $40,Worse right neck and left shoulder blade, Also gets trigger points at dr oneita. PT does the dry needling.  12/12/2023: awesome Patient is stable, Botox  has given her greater than 50% improvement in migraine frequency but she still had a burden of 8 migraine days a month and 15 total headache days a month so we started Ajovy  in December.  Patient feels that her migraines cause her jaw to ache in her jaw aching also can cause her migraines to worsen it is a trigger for migraines included 5 units in each masseter to see if that helps with migraine severity.  No orders of the defined types were placed in this encounter.     Physical Therapy: Cervical myofascial pain, migraines, neck pain: forward posture contributing to migraines and cervicalgia. Please evaluate and treat including dry needling, stretching, strengthening, manual therapy/massage, heating, TENS unit, exercising for scapular stabilization, pectoral stretching and rhomboid strengthening as clinically warranted as well as any other modality as recommended by evaluation.  09/19/2023: stable. After the 2nd to 3rd month of ajovy  see how she is doing and consider vyepti if needed.  06/27/2023:Patient is doing well > 50% improvement in migraine frequency but she still has 8 migraine days a month and 15 total headache days a month and feels she was much better controlled when she was on Ajovy  and would like it prescribed again.  Meds ordered this encounter  Medications   botulinum toxin Type A  (BOTOX ) injection 155 Units    Botox - 200 units x 1 vial Lot: [id number] Expiration: 11/2026 NDC: 9976-6078-97   Bacteriostatic 0.9% Sodium Chloride - 4 mL  Lot: [id number] Expiration: 07/26/2025 NDC: 9500-8033-97    Dx: G43.711  S/P  Witnessed by Heather PEAK    04/04/2023 sytable 01/03/2023: stable, saw Dr oneita they took her insurance and even feeling better.   10/11/2022: Doing well on botox , > 50% improvement, She has 4-6 migriane days a month, < 10 total headache days a month, failed imitrex, maxalt  and ubrelvy, zomig, relpax will try and get nurtec acutely approved. Dr oneita doesn't take her insurance. Referral for pain clinic fax to [facility] (reminded her to contact Dr. Dan Hy) Phone: [phone], Fax:  [phone] for occipital neuralgia.    Meds ordered this encounter  Medications   botulinum toxin Type A  (BOTOX ) injection 155 Units    Botox - 200 units x 1 vial Lot: [id number] Expiration: 11/2026 NDC: 9976-6078-97   Bacteriostatic 0.9% Sodium Chloride - 4 mL  Lot: [id number] Expiration: 07/26/2025 NDC: 9500-8033-97   Dx: G43.711  S/P  Witnessed by Heather PEAK     07/14/2022: Doing well on botox  for her migraines(>>50% relief of migraine severity, freq and duration with botox ) but still with considerable pain in occipital headaches and nerve blocks are the only thing that helps her occipital headaches. [facility].  No orders of the defined types were placed in this encounter.    04/26/2022: emgality  did not help. Ajovy  not approved. Botox  working great >> 50% improvement in migraine frequency. Needs acute management. Failed imitrex and maxalt  9work ok) Ablation of occipital nerves helped a lot. Now having 4 migraine days a month and < 10 total headache days a month needs a good acute management medication.  Will try nurtec  Meds ordered this encounter  Medications   botulinum toxin Type A  (BOTOX ) injection 155 Units    Botox - 200 units x 1 vial Lot: [id number] Expiration: 11/2026 NDC: 9976-6078-97   Bacteriostatic 0.9% Sodium Chloride - 4 mL  Lot: [id number] Expiration: 07/26/2025 NDC: 9500-8033-97   Dx:  G43.711  S/P  Witnessed by Heather PEAK     01/26/2022: Botox  gives > 50% relief but still having 8 migraine days a month and > 15 headache days a month will try Ajovy , emgality  not helping, aimovig contraindicated due to constipation.     11/02/2021: stable 08/09/2021 ALL: She returns for Botox . >50% improvement, she has a lot of neck pain, getting injections at [facility]. Resend to [facility], she requested some dry needling, loved harlene.    Consent Form Botulism Toxin Injection For Chronic Migraine    Reviewed orally with patient, additionally signature is on file:  Botulism toxin has been approved by the Federal drug administration for treatment of chronic migraine. Botulism toxin does not cure chronic migraine and it may not be effective in some patients.  The administration of botulism toxin is accomplished by injecting a small amount of toxin into the muscles of the neck and head. Dosage must be titrated for each individual. Any benefits resulting from botulism toxin tend to wear off after 3 months with a repeat injection required if benefit is to be maintained. Injections are usually done every 3-4 months with maximum effect peak achieved by about 2 or 3 weeks. Botulism toxin is expensive and you should be sure of what costs you will incur resulting from the injection.  The side effects of botulism toxin use for chronic migraine may include:   -Transient, and usually mild, facial weakness with facial injections  -Transient, and usually mild, head or neck weakness with head/neck injections  -Reduction or loss of forehead facial animation due to forehead muscle weakness  -Eyelid drooping  -Dry eye  -Pain at the site of injection or bruising at the site of injection  -Double vision  -Potential unknown long term risks   Contraindications: You should not have Botox  if you are pregnant, nursing, allergic to albumin, have an infection, skin condition, or muscle  weakness at the site of the injection, or have myasthenia gravis, Lambert-Eaton syndrome, or ALS.  It is also possible that as with any injection, there may be an allergic reaction or no effect from the medication. Reduced effectiveness after repeated injections is sometimes seen and rarely infection at the injection site may occur. All care will be taken to prevent these side effects. If therapy is given over a long time, atrophy and wasting in the muscle injected may occur. Occasionally the patient's become refractory to treatment because they develop antibodies to the toxin. In this event, therapy needs to be modified.  I have read the above information and consent to the administration of botulism toxin.    BOTOX  PROCEDURE NOTE FOR MIGRAINE HEADACHE  Contraindications and precautions discussed with patient(above). Aseptic rocedure was observed and patient tolerated procedure.   The condition has existed for more than 6 months, and pt does not have a diagnosis of ALS, Myasthenia Gravis or Lambert-Eaton Syndrome.  Risks and benefits of injections discussed and pt agrees to proceed with the procedure.  Written consent obtained  These injections are medically necessary. Pt  receives good benefits from these injections. These injections do not cause sedations or hallucinations which the oral therapies may cause.   Description of  procedure:  The patient was placed in a sitting position. The standard protocol was used for Botox  as follows, with 5 units of Botox  injected at each site:  -Procerus muscle, midline injection  -Corrugator muscle, bilateral injection  -Frontalis muscle, bilateral injection, with 2 sites each side, medial injection was performed in the upper one third of the frontalis muscle, in the region vertical from the medial inferior edge of the superior orbital rim. The lateral injection was again in the upper one third of the forehead vertically above the lateral limbus of the  cornea, 1.5 cm lateral to the medial injection site.  -Temporalis muscle injection, 4 sites, bilaterally. The first injection was 3 cm above the tragus of the ear, second injection site was 1.5 cm to 3 cm up from the first injection site in line with the tragus of the ear. The third injection site was 1.5-3 cm forward between the first 2 injection sites. The fourth injection site was 1.5 cm posterior to the second injection site. 5th site laterally in the temporalis  muscleat the level of the outer canthus.  -Occipitalis muscle injection, 3 sites, bilaterally. The first injection was done one half way between the occipital protuberance and the tip of the mastoid process behind the ear. The second injection site was done lateral and superior to the first, 1 fingerbreadth from the first injection. The third injection site was 1 fingerbreadth superiorly and medially from the first injection site.  -Cervical paraspinal muscle injection, 2 sites, bilaterally. The first injection site was 1 cm from the midline of the cervical spine, 3 cm inferior to the lower border of the occipital protuberance. The second injection site was 1.5 cm superiorly and laterally to the first injection site.  -Trapezius muscle injection was performed at 3 sites, bilaterally. The first injection site was in the upper trapezius muscle halfway between the inflection point of the neck, and the acromion. The second injection site was one half way between the acromion and the first injection site. The third injection was done between the first injection site and the inflection point of the neck.  Will return for repeat injection in 3 months.   A total of 155 units of Botox  was prepared, 45 units of Botox  was injected as documented above, any Botox  not injected was wasted. The patient tolerated the procedure well, there were no complications of the above procedure.

## 2024-06-04 ENCOUNTER — Telehealth: Payer: Self-pay | Admitting: Pharmacist

## 2024-06-04 ENCOUNTER — Ambulatory Visit: Admitting: Pharmacist

## 2024-06-04 NOTE — Telephone Encounter (Signed)
 Patient contacted for follow-up of missed appointment this afternoon.   Patient reports she was unaware the appointment was scheduled.   Reports current tobacco intake of 5-7 cigarettes per day.   Rescheduled appointment for 9/11 at 9:30 AM  Total time with patient call and documentation of interaction: 8 minutes.

## 2024-06-05 NOTE — Telephone Encounter (Signed)
 Reviewed and agree with Dr Rennis plan.

## 2024-06-06 ENCOUNTER — Ambulatory Visit: Admitting: Pharmacist

## 2024-06-10 ENCOUNTER — Ambulatory Visit (INDEPENDENT_AMBULATORY_CARE_PROVIDER_SITE_OTHER): Admitting: Licensed Clinical Social Worker

## 2024-06-10 DIAGNOSIS — F411 Generalized anxiety disorder: Secondary | ICD-10-CM

## 2024-06-10 DIAGNOSIS — F1020 Alcohol dependence, uncomplicated: Secondary | ICD-10-CM

## 2024-06-10 DIAGNOSIS — F331 Major depressive disorder, recurrent, moderate: Secondary | ICD-10-CM | POA: Diagnosis not present

## 2024-06-10 NOTE — Progress Notes (Signed)
 [facility] Counselor/Therapist Progress Note  Patient ID: Gail Blackburn, MRN: [id number]    Date: 06/10/24  Time Spent: 1000  am - 1053 am : 53 Minutes  Treatment Type: Individual Therapy.  Reported Symptoms: Patient reports that she lost her partner of 23 years in 2024 from an overdose and then father past a month later. Patient reports an obsession with money, and she self medicates with alcohol. Recently diagnosed with ADHD. Patient reports doing therapy previously.     Mental Status Exam: Appearance:  Casual     Behavior: Appropriate  Motor: Normal  Speech/Language:  Clear and Coherent  Affect: Flat  Mood: depressed  Thought process: normal  Thought content:   WNL  Sensory/Perceptual disturbances:   WNL  Orientation: oriented to person, place, time/date, situation, day of week, month of year, and year  Attention: Good  Concentration: Good  Memory: WNL  Fund of knowledge:  Good  Insight:   Good  Judgment:  Good  Impulse Control: Good   Risk Assessment: Danger to Self:  No Self-injurious Behavior: No Danger to Others: No Duty to Warn:no Physical Aggression / Violence:No  Access to Firearms a concern: No  Gang Involvement:No   Subjective:   Rosina Potters participated from home, via video. Patient is aware of risk and limitations, and consented to treatment. Therapist participated from office. We met online due to patient request.  Nashali presented for her session being tearful. She reports that she drank over the weekend and struggles to refrain from drinking. Pami states that she and her mother were arguing over her drinking and the issues that arise from the drinking. Chevette states that she has stolen to get her alcohol before. She states that she is ashamed and is concerned about her future is this should continue. Deborrah states that she needs to cope with the death of her deceased partner and her mother needs to work on her grief. 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   Clinician actively listened and processed with patient her concerns. Clinician provided support via active listening and verbal feedback. Clinician processed the importance of focusing on her overall wellness. Clinician processed with patient speaking to her doctor about medication that can help with her drinking. We also discussed the dangers of mixing alcohol with Xanax . She reports that she does not mix them and does not use her Xanax  when drinking. Clinician provided information regarding coping skills for sobriety.  Ten Coping Skills Needed for Protecting Sobriety Long-Term Healthy Communication: Develop good communication skills to convey emotions, needs, and concerns. Seek help from trustworthy friends, family, or support groups to discuss your experiences and emotions.  Question: How do you advocate for yourself when you need help?  Self-Care: Make self-care activities that enhance physical, emotional, and mental well-being a priority. These can include physical activity, a well-balanced diet, adequate sleep, relaxation techniques, and participation in activities that offer joy and fulfillment to you.  Question: What's one self-care activity you can do right now?  Stress Management: Deep breathing exercises, mindfulness, meditation, yoga, journaling, or exploring hobbies that promote relaxation and stress reduction are all stress management practices to learn and practice.  Question: Which activities from these categories could you practice almost anywhere?  Positive Reinforcement: Celebrate your recovery journey's milestones and accomplishments. Recognize your achievements and reward yourself for reaching goals, no matter how modest. Positive reinforcement can aid in increasing motivation and self-esteem.  Question: What might be helpful  daily and weekly rewards for reaching your goals?  Support Systems: Create a network of people who  understand and support your recovery. The options include friends, family, support groups, or a 12-Step sponsor. Engage with your support system on a regular basis and seek their advice and encouragement as needed.  Question: Who in your life is someone who understands the demands of recovery?  Coping with Triggers: Determine the people, events, or situations that may entice you to use substances and use strategies to respond to them. While you can aim to avoid these situations at times, you may be faced with them without warning. Finding a way to distract yourself, leave the environment, or turn to a support person can be ways to avoid relapse.  Question: What new place in your life right now makes staying sober easier?  Problem-Solving Skills: Develop the ability to solve problems in real-time, which means coming up with a solution on the spot. It can involve breaking down challenges into small parts, thinking about potential solutions, and seeking opinions or guidance from others when needed.  Question: What was a creative solution you had for solving a problem recently?  Healthy Boundaries: Set and keep appropriate boundaries with those who might encourage substance use. Understand how to say "no" to situations or individuals that may harm your recovery.  Question: How can you build emotional boundaries that help you handle difficulties without blaming or criticizing others?  Cognitive Restructuring: Practice cognitive restructuring approaches to challenge negative and self-destructive thinking habits. Replace negative ideas with positive, realistic ones, and concentrate on reframing beliefs that may lead to substance abuse.  Question: What beliefs regarding drug use do you wish to change in your own thinking?  Relapse Prevention Strategies: Learn and use relapse prevention tactics such as creating a relapse prevention plan, detecting early warning signs of relapse, and having a plan of  action in place in the case of a relapse. Maintain contact with your support system and, if necessary, seek guidance from professionals.  Question: What thoughts do you believe could make you relapse?  How to Get Help Building Coping Skills for Recovery When looking to acquire coping skills for sobriety, it's important to evaluate the programs offered at a treatment facility. Whether opting for residential or outpatient treatment, you want a program that provides individualized treatment plans to address each person's specific challenges, strengths, and goals. This personalized approach ensures that you receive the support and coping skills training tailored to your situation. Family therapy sessions can help you to develop coping skills as they help improve communication, rebuild relationships, and provide a supportive environment. Building coping skills also comes from classes in treatment that focus on practical tools, strategies, and information to enhance your ability to cope with triggers and cravings. Patient is to use CBT, mindfulness and coping skills to help manage decrease symptoms associated with their diagnosis. Treatment planning to be reviewed by 05/21/2025.   Interventions: Cognitive Behavioral Therapy, Dialectical Behavioral Therapy, Assertiveness/Communication, Mindfulness Meditation, Motivational Interviewing, 12-Step, and Solution-Oriented/Positive Psychology  Diagnosis: Major Depressive Disorder recurrent moderate, Generalized Anxiety Disorder, Alcohol Dependence moderate    Damien Junk MSW, LCSW/DATE 06/10/2024

## 2024-06-13 ENCOUNTER — Telehealth: Payer: Self-pay | Admitting: *Deleted

## 2024-06-13 NOTE — Progress Notes (Signed)
 Care Guide Pharmacy Note  06/13/2024 Name: Gail Blackburn MRN: [id number] DOB: [birth year]  Referred By: Diona Perkins, MD Reason for referral: Complex Care Management (Outreach to reschedule PharmD referral )   Gail Blackburn is a 48 y.o. year old female who is a primary care patient of Diona Perkins, MD.  Gail Blackburn was referred to the pharmacist for assistance related to: Smoking and help with cost of inhaler.   Successful contact was made with the patient to discuss pharmacy services.  Patient declines engagement at this time. Contact information was provided to the patient should they wish to reach out for assistance at a later time.  Gail Blackburn  [facility]  [facility], [facility] Guide  Direct Dial: [phone]  Fax [phone]

## 2024-06-17 ENCOUNTER — Telehealth: Payer: Self-pay

## 2024-06-17 ENCOUNTER — Other Ambulatory Visit (HOSPITAL_COMMUNITY): Payer: Self-pay

## 2024-06-17 NOTE — Telephone Encounter (Signed)
 It is time to renew this PA however PT has not been evaluated since starting the medication. Insurance requires documentation on how the medication is helping the PT-please see below.Gail Blackburn

## 2024-06-19 NOTE — Telephone Encounter (Signed)
 Called pt.  She has been taking qulipta  for 30 days, just picked up her 2nd bottle.  She has not noted any difference at this time.  Has not noted any toxicity.  Still will continue to utilize intervention modalities.  She appreciated call.

## 2024-06-20 ENCOUNTER — Other Ambulatory Visit (HOSPITAL_COMMUNITY): Payer: Self-pay

## 2024-06-20 ENCOUNTER — Ambulatory Visit: Admitting: Family Medicine

## 2024-06-20 NOTE — Telephone Encounter (Signed)
 Pharmacy Patient Advocate Encounter  Received notification from [employer] that Prior Authorization for Qulipta  60mg  [place] has been APPROVED from 06/20/2024 to 09/18/2024   PA #/Case ID/Reference #: [id number]  KEY: [id number]  Since PT just started meds and is only starting on her 2nd month I submitted a a new PA.

## 2024-06-24 ENCOUNTER — Ambulatory Visit: Admitting: Licensed Clinical Social Worker

## 2024-06-25 ENCOUNTER — Other Ambulatory Visit (HOSPITAL_COMMUNITY): Payer: Self-pay

## 2024-06-26 ENCOUNTER — Ambulatory Visit: Admitting: Physical Therapy

## 2024-07-03 NOTE — Progress Notes (Deleted)
 [facility] [address] [place] [address] Phone: [phone] Subjective:    I'm seeing this patient by the request  of:  Diona Perkins, MD  CC: back and neck pain   YEP:Gail Blackburn  Gail Blackburn is a 48 y.o. female coming in with complaint of back and neck pain. OMT 04/19/2024. Patient states   Medications patient has been prescribed: None  Taking:      MRI cervical showed arthritis but no specific nerve impingement  Epidural 8/11   NEED TO RECHECK CALCIUM  LABS  Reviewed prior external information including notes and imaging from previsou exam, outside providers and external EMR if available.   As well as notes that were available from care everywhere and other healthcare systems.  Past medical history, social, surgical and family history all reviewed in electronic medical record.  No pertanent information unless stated regarding to the chief complaint.   Past Medical History:  Diagnosis Date   Anemia    PRIOR HISTORY   Anxiety    Asthma    Bipolar disorder (HCC)    Borderline   Depression    Fibromyalgia    Genital herpes simplex    Headache    MIGRAINES   Hepatic lesion 2022   interminate   Hypothyroidism 2009   TOOK MEDS FOR FEW WEEKS NO MEDS NOW   Migraine    Pancreatitis    Splenic vein thrombosis    Thyroid disease    treated in past not sure if too high or too low    Allergies  Allergen Reactions   Latex Rash and Other (See Comments)    Only in vaginal area   Tape Rash and Other (See Comments)    Rashes occur only if left on for a long time     Review of Systems:  No headache, visual changes, nausea, vomiting, diarrhea, constipation, dizziness, abdominal pain, skin rash, fevers, chills, night sweats, weight loss, swollen lymph nodes, body aches, joint swelling, chest pain, shortness of breath, mood changes. POSITIVE muscle aches  Objective  There were no vitals taken for this visit.   General: No apparent  distress alert and oriented x3 mood and affect normal, dressed appropriately.  HEENT: Pupils equal, extraocular movements intact  Respiratory: Patient's speak in full sentences and does not appear short of breath  Cardiovascular: No lower extremity edema, non tender, no erythema  Gait MSK:  Back   Osteopathic findings  C2 flexed rotated and side bent right C6 flexed rotated and side bent left T3 extended rotated and side bent right inhaled rib T9 extended rotated and side bent left L2 flexed rotated and side bent right Sacrum right on right       Assessment and Plan:  No problem-specific Assessment & Plan notes found for this encounter.    Nonallopathic problems  Decision today to treat with OMT was based on Physical Exam  After verbal consent patient was treated with HVLA, ME, FPR techniques in cervical, rib, thoracic, lumbar, and sacral  areas  Patient tolerated the procedure well with improvement in symptoms  Patient given exercises, stretches and lifestyle modifications  See medications in patient instructions if given  Patient will follow up in 4-8 weeks    The above documentation has been reviewed and is accurate and complete Kavish Lafitte M Keno Caraway, DO          Note: This dictation was prepared with Dragon dictation along with smaller phrase technology. Any transcriptional errors that result from  this process are unintentional.

## 2024-07-04 ENCOUNTER — Other Ambulatory Visit: Payer: Self-pay

## 2024-07-04 MED ORDER — PROPRANOLOL HCL ER 60 MG PO CP24: 60.0000 mg | ORAL_CAPSULE | Freq: Every day | ORAL | 3 refills | Status: AC

## 2024-07-08 ENCOUNTER — Ambulatory Visit (INDEPENDENT_AMBULATORY_CARE_PROVIDER_SITE_OTHER): Admitting: Licensed Clinical Social Worker

## 2024-07-08 ENCOUNTER — Ambulatory Visit: Admitting: Family Medicine

## 2024-07-08 DIAGNOSIS — F1020 Alcohol dependence, uncomplicated: Secondary | ICD-10-CM

## 2024-07-08 DIAGNOSIS — F331 Major depressive disorder, recurrent, moderate: Secondary | ICD-10-CM

## 2024-07-08 DIAGNOSIS — F411 Generalized anxiety disorder: Secondary | ICD-10-CM | POA: Diagnosis not present

## 2024-07-08 NOTE — Progress Notes (Signed)
 [facility] Counselor/Therapist Progress Note  Patient ID: Gail Blackburn, MRN: [id number]    Date: 07/08/24  Time Spent: 805  am - 0858 am : 53 Minutes  Treatment Type: Individual Therapy.  Reported Symptoms: Patient reports that she lost her partner of 23 years in 2024 from an overdose and then father past a month later. Patient reports an obsession with money, and she self medicates with alcohol. Recently diagnosed with ADHD. Patient reports doing therapy previously.      Mental Status Exam: Appearance:  Casual     Behavior: Appropriate  Motor: Normal  Speech/Language:  Clear and Coherent  Affect: Flat  Mood: depressed  Thought process: normal  Thought content:   WNL  Sensory/Perceptual disturbances:   WNL  Orientation: oriented to person, place, time/date, situation, day of week, month of year, and year  Attention: Good  Concentration: Good  Memory: WNL  Fund of knowledge:  Good  Insight:   Good  Judgment:  Good  Impulse Control: Good    Risk Assessment: Danger to Self:  No Self-injurious Behavior: No Danger to Others: No Duty to Warn:no Physical Aggression / Violence:No  Access to Firearms a concern: No  Gang Involvement:No    Subjective:    Gail Blackburn participated from home, via video. Patient is aware of risk and limitations, and consented to treatment. Therapist participated from office. We met online due to patient request.  Gail Blackburn presented for her session stating she isn't doing well. She states she cannot stop drinking. She states that she  cannot go a day without drinking. Gail Blackburn reports that she has been arguing with her mother. She states she has been drinking badly since 2019. She reports that her mother feels that her drinking is the root of her problems. Gail Blackburn states that her mother is saying she is going to kick her out if they argue again. Patient reports that she has been out of her Rexulti  for about a month and just found a [facility] that has  it and she has to pick it up. Clinician encouraged patient to go to a long term treatment. Patient reports that she is not ready for that.   Clinician actively listened and provided support and encouragement. Clinician processed with patient how rehab works. Clinician encouraged patient to discuss this idea with her PCP and get their feedback too, as patient is concerned about her medications and what she would not be allowed to take. Patient reports that her PCP is aware of the use of her alcohol with the Zanax. Clinician processed with patient the advantages of getting sober and being in control of her life. Clinician encouraged patient to set goals for herself for the next few years.   Gail Blackburn was fully engaged in session.She was transparent about her alcohol use and was motivated for treatment. Gail Blackburn is insightful in knowing that she is drinking too much and she is destroying her life. She is aware of her need to quit the drinking all together and not drink at all. Gail Blackburn will continue to engage in bi weekly CBT therapy. Patient will use  mindfulness and coping skills to help manage decrease symptoms associated with her diagnosis. Treatment planning to be reviewed by 05/21/2025.   Interventions: Cognitive Behavioral Therapy, Mindfulness Meditation, Motivational Interviewing, and Solution-Oriented/Positive Psychology  Diagnosis: Major Depressive Disorder, recurrent moderate, Generalized Anxiety Disorder, Alcohol use moderate dependence.    Damien Junk MSW, LCSW/DATE 07/08/2024

## 2024-07-16 DIAGNOSIS — M542 Cervicalgia: Secondary | ICD-10-CM | POA: Diagnosis not present

## 2024-07-16 DIAGNOSIS — G43719 Chronic migraine without aura, intractable, without status migrainosus: Secondary | ICD-10-CM | POA: Diagnosis not present

## 2024-07-16 DIAGNOSIS — M791 Myalgia, unspecified site: Secondary | ICD-10-CM | POA: Diagnosis not present

## 2024-07-16 DIAGNOSIS — G518 Other disorders of facial nerve: Secondary | ICD-10-CM | POA: Diagnosis not present

## 2024-07-22 ENCOUNTER — Ambulatory Visit: Admitting: Licensed Clinical Social Worker

## 2024-07-31 ENCOUNTER — Ambulatory Visit: Admitting: Physical Therapy

## 2024-08-05 ENCOUNTER — Ambulatory Visit: Admitting: Licensed Clinical Social Worker

## 2024-08-07 DIAGNOSIS — F250 Schizoaffective disorder, bipolar type: Secondary | ICD-10-CM | POA: Diagnosis not present

## 2024-08-07 DIAGNOSIS — F902 Attention-deficit hyperactivity disorder, combined type: Secondary | ICD-10-CM | POA: Diagnosis not present

## 2024-08-15 ENCOUNTER — Telehealth: Payer: Self-pay | Admitting: Family Medicine

## 2024-08-15 DIAGNOSIS — G43711 Chronic migraine without aura, intractable, with status migrainosus: Secondary | ICD-10-CM

## 2024-08-15 MED ORDER — BOTOX 200 UNITS IJ SOLR: INTRAMUSCULAR | 0 refills | Status: AC

## 2024-08-15 NOTE — Telephone Encounter (Signed)
 Thanks! Jillian FYI

## 2024-08-15 NOTE — Addendum Note (Signed)
 Addended by: VEAR CHARLIE LABOR on: 08/15/2024 04:56 PM   Modules accepted: Orders

## 2024-08-15 NOTE — Addendum Note (Signed)
 Addended by: HILLIARD HEATHER CROME on: 08/15/2024 04:20 PM   Modules accepted: Orders

## 2024-08-15 NOTE — Telephone Encounter (Signed)
 The rx that [facility] had on file is out of fills, please send a new Botox  200u rx under Amy.

## 2024-08-19 ENCOUNTER — Ambulatory Visit: Admitting: Licensed Clinical Social Worker

## 2024-08-23 ENCOUNTER — Other Ambulatory Visit (HOSPITAL_COMMUNITY): Payer: Self-pay

## 2024-08-23 ENCOUNTER — Telehealth: Payer: Self-pay

## 2024-08-23 NOTE — Telephone Encounter (Signed)
 Hello Prescriber!  We are in the process of submitting a prior authorization for your patient for Qulipta . We are reaching out for clinical guidance for the following information to complete the request: Plan requiring documentation of clinical benefit since starting therapy.   Thank you! Pharmacy Team

## 2024-08-26 NOTE — Telephone Encounter (Signed)
 I will archive CMM Key.

## 2024-08-28 ENCOUNTER — Ambulatory Visit: Admitting: Physical Therapy

## 2024-08-28 ENCOUNTER — Ambulatory Visit: Admitting: Family Medicine

## 2024-08-28 VITALS — BP 109/62 | HR 78

## 2024-08-28 DIAGNOSIS — G43711 Chronic migraine without aura, intractable, with status migrainosus: Secondary | ICD-10-CM

## 2024-08-28 MED ORDER — ONABOTULINUMTOXINA 200 UNITS IJ SOLR
155.0000 [IU] | Freq: Once | INTRAMUSCULAR | Status: AC
  Administered 2024-08-28: 155 [IU] via INTRAMUSCULAR

## 2024-08-28 NOTE — Progress Notes (Signed)
 Botox - 200 units x 1 vial Lot: [id number]  Expiration: 11/2026 NDC: 9976-6078-97   Bacteriostatic 0.9% Sodium Chloride - 4 mL  Lot: [id number] Expiration: 07/26/2025 NDC: 9500-8033-97   Dx: H56.288  S/P  Witnessed by Diandra D
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
## 2024-09-02 ENCOUNTER — Ambulatory Visit: Admitting: Neurology

## 2024-09-04 DIAGNOSIS — M542 Cervicalgia: Secondary | ICD-10-CM | POA: Diagnosis not present

## 2024-09-04 DIAGNOSIS — M791 Myalgia, unspecified site: Secondary | ICD-10-CM | POA: Diagnosis not present

## 2024-09-04 DIAGNOSIS — G43719 Chronic migraine without aura, intractable, without status migrainosus: Secondary | ICD-10-CM | POA: Diagnosis not present

## 2024-09-04 DIAGNOSIS — G518 Other disorders of facial nerve: Secondary | ICD-10-CM | POA: Diagnosis not present

## 2024-09-16 ENCOUNTER — Ambulatory Visit: Admitting: Licensed Clinical Social Worker

## 2024-09-20 ENCOUNTER — Telehealth: Payer: Self-pay | Admitting: Pharmacist

## 2024-09-20 NOTE — Telephone Encounter (Signed)
 Pharmacy Patient Advocate Encounter  Received notification from [employer] that Prior Authorization for Atogepant  (QULIPTA ) 60 MG TABS has been APPROVED from 09/20/2024 to 09/20/2025   PA #/Case ID/Reference #: [id number]

## 2024-09-20 NOTE — Telephone Encounter (Signed)
 Pharmacy Patient Advocate Encounter   Received notification from [employer] that prior authorization for Qulipta  60MG  tablets is required/requested.   Insurance verification completed.   The patient is insured through [employer].   Per test claim: PA required; PA submitted to above mentioned insurance via Latent Key/confirmation #/EOC [id number] Status is pending

## 2024-09-24 DIAGNOSIS — M5412 Radiculopathy, cervical region: Secondary | ICD-10-CM | POA: Diagnosis not present

## 2024-09-24 DIAGNOSIS — M47812 Spondylosis without myelopathy or radiculopathy, cervical region: Secondary | ICD-10-CM | POA: Diagnosis not present

## 2024-09-26 ENCOUNTER — Other Ambulatory Visit: Payer: Self-pay | Admitting: Family Medicine

## 2024-09-26 NOTE — Progress Notes (Signed)
"  error  "

## 2024-09-30 ENCOUNTER — Ambulatory Visit: Admitting: Licensed Clinical Social Worker

## 2024-09-30 DIAGNOSIS — F1020 Alcohol dependence, uncomplicated: Secondary | ICD-10-CM | POA: Diagnosis not present

## 2024-09-30 DIAGNOSIS — F331 Major depressive disorder, recurrent, moderate: Secondary | ICD-10-CM

## 2024-09-30 DIAGNOSIS — F411 Generalized anxiety disorder: Secondary | ICD-10-CM

## 2024-09-30 NOTE — Progress Notes (Signed)
 [facility] Counselor/Therapist Progress Note  Patient ID: Gail Blackburn, MRN: [id number]    Date: 09/30/2024  Time Spent: 0800  am - 0859 am : 59 Minutes  Treatment Type: Individual Therapy.  Reported Symptoms: Patient reports that she lost her partner of 23 years in 2024 from an overdose and then father past a month later. Patient reports an obsession with money, and she self medicates with alcohol. Recently diagnosed with ADHD. Patient reports doing therapy previously.      Mental Status Exam: Appearance:  Casual     Behavior: Appropriate  Motor: Normal  Speech/Language:  Clear and Coherent  Affect: Flat  Mood: depressed  Thought process: normal  Thought content:   WNL  Sensory/Perceptual disturbances:   WNL  Orientation: oriented to person, place, time/date, situation, day of week, month of year, and year  Attention: Good  Concentration: Good  Memory: WNL  Fund of knowledge:  Good  Insight:   Good  Judgment:  Good  Impulse Control: Good    Risk Assessment: Danger to Self:  No Self-injurious Behavior: No Danger to Others: No Duty to Warn:no Physical Aggression / Violence:No  Access to Firearms a concern: No  Gang Involvement:No    Subjective:    Gail Blackburn participated from home, via video. Patient is aware of risk and limitations, and consented to treatment. Therapist participated from office. We met online due to patient request.  Gail Blackburn reports that her Aderall is at the right dosage and she can see an improvement. She states that she and her Mom still argue but in the evening. She reports that she has met a guy and right now she feels happy. She states that she continues to have anger. She reports that she feels anxious until she has a drink of alcohol ([place]). Patient reports that she believes she does it due to habit.   Clinician actively listened and processed with patient her emotions. Clinician pointed out that while she has a new relationship  it's important to make sure that she doesn't allow this to prevent her from recognizing the need to continue to work on herself and her healing. Clinician processed that being aware of her own symptoms and how to cope is essential. We processed how to cope with anger toward others and especially her mother. We identified that acknowledging your feelings, understanding their roots (often unmet childhood needs), setting firm boundaries, and finding healthy outlets like journaling, therapy, or support groups to process them, focusing on your own healing and accepting she may not change, rather than trying to change her. Techniques include deep breathing for in-the-moment calm, exploring her past for perspective, and prioritizing self-compassion to shift from blame to self-care and realistic expectations.   Gail Blackburn was fully engaged in session.She was transparent about her alcohol use and was motivated for treatment. Gail Blackburn is insightful in knowing that she is drinking too much and she is destroying her life. She is aware of her need to quit the drinking all together and not drink at all. Gail Blackburn will continue to engage in bi weekly CBT therapy. Patient will use  mindfulness and coping skills to help manage decrease symptoms associated with her diagnosis. Treatment planning to be reviewed by 05/21/2025.   Interventions: Cognitive Behavioral Therapy, Mindfulness Meditation, Motivational Interviewing, and Solution-Oriented/Positive Psychology   Diagnosis: Major Depressive Disorder, recurrent moderate, Generalized Anxiety Disorder, Alcohol use moderate dependence.     Damien Junk MSW, LCSW/DATE 09/30/2024

## 2024-10-09 ENCOUNTER — Encounter: Payer: Self-pay | Admitting: Family Medicine

## 2024-10-10 ENCOUNTER — Other Ambulatory Visit: Payer: Self-pay | Admitting: Neurology

## 2024-10-10 DIAGNOSIS — G43711 Chronic migraine without aura, intractable, with status migrainosus: Secondary | ICD-10-CM

## 2024-10-10 MED ORDER — METHYLPREDNISOLONE 4 MG PO TBPK: ORAL_TABLET | ORAL | 1 refills | Status: AC

## 2024-10-14 ENCOUNTER — Ambulatory Visit: Admitting: Licensed Clinical Social Worker

## 2024-10-28 ENCOUNTER — Ambulatory Visit (INDEPENDENT_AMBULATORY_CARE_PROVIDER_SITE_OTHER): Admitting: Licensed Clinical Social Worker

## 2024-10-28 DIAGNOSIS — F411 Generalized anxiety disorder: Secondary | ICD-10-CM | POA: Diagnosis not present

## 2024-10-28 DIAGNOSIS — F1020 Alcohol dependence, uncomplicated: Secondary | ICD-10-CM

## 2024-10-28 DIAGNOSIS — F331 Major depressive disorder, recurrent, moderate: Secondary | ICD-10-CM | POA: Diagnosis not present

## 2024-11-11 ENCOUNTER — Ambulatory Visit: Admitting: Licensed Clinical Social Worker

## 2024-11-25 ENCOUNTER — Ambulatory Visit: Admitting: Licensed Clinical Social Worker

## 2024-12-02 ENCOUNTER — Ambulatory Visit: Admitting: Family Medicine

## 2024-12-09 ENCOUNTER — Ambulatory Visit: Admitting: Licensed Clinical Social Worker

## 2024-12-23 ENCOUNTER — Ambulatory Visit: Admitting: Licensed Clinical Social Worker
